# Patient Record
Sex: Female | Born: 1950 | Race: Black or African American | Hispanic: No | Smoking: Never smoker
Health system: Southern US, Community
[De-identification: ages and names within clinical notes are randomized; demographics above are authoritative.]

## PROBLEM LIST (undated history)

## (undated) DIAGNOSIS — M25551 Pain in right hip: Secondary | ICD-10-CM

## (undated) DIAGNOSIS — R42 Dizziness and giddiness: Secondary | ICD-10-CM

## (undated) DIAGNOSIS — I10 Essential (primary) hypertension: Secondary | ICD-10-CM

## (undated) DIAGNOSIS — J3089 Other allergic rhinitis: Secondary | ICD-10-CM

## (undated) DIAGNOSIS — R12 Heartburn: Secondary | ICD-10-CM

## (undated) DIAGNOSIS — R0789 Other chest pain: Secondary | ICD-10-CM

## (undated) DIAGNOSIS — H547 Unspecified visual loss: Secondary | ICD-10-CM

## (undated) DIAGNOSIS — M7989 Other specified soft tissue disorders: Secondary | ICD-10-CM

## (undated) DIAGNOSIS — Z0000 Encounter for general adult medical examination without abnormal findings: Principal | ICD-10-CM

## (undated) DIAGNOSIS — M1990 Unspecified osteoarthritis, unspecified site: Secondary | ICD-10-CM

## (undated) DIAGNOSIS — K219 Gastro-esophageal reflux disease without esophagitis: Secondary | ICD-10-CM

## (undated) DIAGNOSIS — G4730 Sleep apnea, unspecified: Secondary | ICD-10-CM

## (undated) DIAGNOSIS — J45909 Unspecified asthma, uncomplicated: Secondary | ICD-10-CM

## (undated) DIAGNOSIS — R200 Anesthesia of skin: Secondary | ICD-10-CM

## (undated) DIAGNOSIS — H9319 Tinnitus, unspecified ear: Secondary | ICD-10-CM

## (undated) DIAGNOSIS — E876 Hypokalemia: Secondary | ICD-10-CM

## (undated) DIAGNOSIS — R0602 Shortness of breath: Secondary | ICD-10-CM

## (undated) DIAGNOSIS — F329 Major depressive disorder, single episode, unspecified: Secondary | ICD-10-CM

## (undated) DIAGNOSIS — E669 Obesity, unspecified: Secondary | ICD-10-CM

## (undated) DIAGNOSIS — M549 Dorsalgia, unspecified: Secondary | ICD-10-CM

## (undated) DIAGNOSIS — M79606 Pain in leg, unspecified: Secondary | ICD-10-CM

## (undated) HISTORY — DX: Shortness of breath: R06.02

## (undated) HISTORY — PX: OTHER SURGICAL HISTORY: SHX169

## (undated) HISTORY — DX: Sleep apnea, unspecified: G47.30

## (undated) HISTORY — DX: Major depressive disorder, single episode, unspecified: F32.9

## (undated) HISTORY — DX: Morbid (severe) obesity due to excess calories: E66.01

## (undated) HISTORY — DX: Gastro-esophageal reflux disease without esophagitis: K21.9

## (undated) HISTORY — DX: Hypokalemia: E87.6

## (undated) HISTORY — DX: Obesity, unspecified: E66.9

## (undated) HISTORY — DX: Essential (primary) hypertension: I10

## (undated) HISTORY — DX: Dizziness and giddiness: R42

## (undated) HISTORY — DX: Encounter for general adult medical examination without abnormal findings: Z00.00

## (undated) HISTORY — DX: Other allergic rhinitis: J30.89

## (undated) HISTORY — DX: Unspecified asthma, uncomplicated: J45.909

## (undated) HISTORY — DX: Unspecified osteoarthritis, unspecified site: M19.90

## (undated) HISTORY — DX: Anesthesia of skin: R20.0

## (undated) HISTORY — DX: Other chest pain: R07.89

## (undated) HISTORY — DX: Other specified soft tissue disorders: M79.89

## (undated) HISTORY — DX: Dorsalgia, unspecified: M54.9

## (undated) HISTORY — DX: Tinnitus, unspecified ear: H93.19

## (undated) HISTORY — DX: Unspecified visual loss: H54.7

## (undated) HISTORY — DX: Pain in leg, unspecified: M79.606

## (undated) HISTORY — DX: Heartburn: R12

## (undated) HISTORY — DX: Pain in right hip: M25.551

---

## 1995-06-04 HISTORY — PX: BIOPSY BREAST: PRO8

## 2002-06-03 HISTORY — PX: COLONOSCOPY: SHX174

## 2006-01-10 ENCOUNTER — Ambulatory Visit: Payer: Self-pay | Admitting: Internal Medicine

## 2006-01-16 ENCOUNTER — Ambulatory Visit: Payer: Self-pay | Admitting: Internal Medicine

## 2006-02-07 ENCOUNTER — Ambulatory Visit: Payer: Self-pay | Admitting: Internal Medicine

## 2006-05-02 ENCOUNTER — Ambulatory Visit: Payer: Self-pay | Admitting: Internal Medicine

## 2006-06-23 ENCOUNTER — Ambulatory Visit: Payer: Self-pay | Admitting: Internal Medicine

## 2006-08-08 ENCOUNTER — Ambulatory Visit: Payer: Self-pay | Admitting: Internal Medicine

## 2006-08-08 LAB — CONVERTED CEMR LAB
Calcium: 9.2 mg/dL (ref 8.4–10.5)
Chloride: 105 meq/L (ref 96–112)
Creatinine, Ser: 0.9 mg/dL (ref 0.4–1.2)
GFR calc non Af Amer: 70 mL/min

## 2006-09-23 LAB — CONVERTED CEMR LAB: Pap Smear: NORMAL

## 2006-09-29 ENCOUNTER — Ambulatory Visit: Payer: Self-pay | Admitting: Pulmonary Disease

## 2006-10-23 ENCOUNTER — Ambulatory Visit (HOSPITAL_BASED_OUTPATIENT_CLINIC_OR_DEPARTMENT_OTHER): Admission: RE | Admit: 2006-10-23 | Discharge: 2006-10-23 | Payer: Self-pay | Admitting: Pulmonary Disease

## 2006-11-01 ENCOUNTER — Ambulatory Visit: Payer: Self-pay | Admitting: Pulmonary Disease

## 2006-12-08 ENCOUNTER — Ambulatory Visit: Payer: Self-pay | Admitting: Pulmonary Disease

## 2007-01-12 ENCOUNTER — Ambulatory Visit: Payer: Self-pay | Admitting: Pulmonary Disease

## 2007-01-31 ENCOUNTER — Encounter: Payer: Self-pay | Admitting: *Deleted

## 2007-01-31 DIAGNOSIS — J309 Allergic rhinitis, unspecified: Secondary | ICD-10-CM | POA: Insufficient documentation

## 2007-01-31 DIAGNOSIS — K219 Gastro-esophageal reflux disease without esophagitis: Secondary | ICD-10-CM | POA: Insufficient documentation

## 2007-01-31 DIAGNOSIS — J45909 Unspecified asthma, uncomplicated: Secondary | ICD-10-CM | POA: Insufficient documentation

## 2007-01-31 DIAGNOSIS — I10 Essential (primary) hypertension: Secondary | ICD-10-CM | POA: Insufficient documentation

## 2007-05-26 ENCOUNTER — Ambulatory Visit: Payer: Self-pay | Admitting: Internal Medicine

## 2007-05-26 DIAGNOSIS — M722 Plantar fascial fibromatosis: Secondary | ICD-10-CM | POA: Insufficient documentation

## 2007-05-26 DIAGNOSIS — M7710 Lateral epicondylitis, unspecified elbow: Secondary | ICD-10-CM | POA: Insufficient documentation

## 2007-06-20 ENCOUNTER — Ambulatory Visit: Payer: Self-pay | Admitting: Internal Medicine

## 2007-06-20 DIAGNOSIS — J019 Acute sinusitis, unspecified: Secondary | ICD-10-CM | POA: Insufficient documentation

## 2007-07-23 ENCOUNTER — Ambulatory Visit: Payer: Self-pay | Admitting: Internal Medicine

## 2007-08-18 ENCOUNTER — Encounter (INDEPENDENT_AMBULATORY_CARE_PROVIDER_SITE_OTHER): Payer: Self-pay | Admitting: *Deleted

## 2007-09-16 ENCOUNTER — Ambulatory Visit: Payer: Self-pay | Admitting: Internal Medicine

## 2007-09-18 LAB — CONVERTED CEMR LAB
ALT: 26 units/L (ref 0–35)
Albumin: 3.6 g/dL (ref 3.5–5.2)
BUN: 13 mg/dL (ref 6–23)
CO2: 29 meq/L (ref 19–32)
Calcium: 9.1 mg/dL (ref 8.4–10.5)
Creatinine, Ser: 1 mg/dL (ref 0.4–1.2)
GFR calc Af Amer: 74 mL/min
Glucose, Bld: 89 mg/dL (ref 70–99)
HDL: 52 mg/dL (ref >39.0)
TSH: 2.18 microintl units/mL (ref 0.35–5.50)
Total Protein: 7 g/dL (ref 6.0–8.3)
Triglycerides: 57 mg/dL (ref 0–149)
VLDL: 11 mg/dL (ref 0–40)

## 2007-10-09 ENCOUNTER — Ambulatory Visit: Payer: Self-pay | Admitting: Internal Medicine

## 2007-11-05 ENCOUNTER — Ambulatory Visit: Payer: Self-pay | Admitting: Internal Medicine

## 2007-11-26 ENCOUNTER — Emergency Department (HOSPITAL_COMMUNITY): Admission: EM | Admit: 2007-11-26 | Discharge: 2007-11-26 | Payer: Self-pay | Admitting: Emergency Medicine

## 2007-12-21 LAB — CONVERTED CEMR LAB: Pap Smear: ABNORMAL

## 2008-04-11 ENCOUNTER — Ambulatory Visit: Payer: Self-pay | Admitting: Internal Medicine

## 2008-04-11 DIAGNOSIS — R209 Unspecified disturbances of skin sensation: Secondary | ICD-10-CM | POA: Insufficient documentation

## 2008-04-11 DIAGNOSIS — E669 Obesity, unspecified: Secondary | ICD-10-CM | POA: Insufficient documentation

## 2008-04-11 DIAGNOSIS — R12 Heartburn: Secondary | ICD-10-CM | POA: Insufficient documentation

## 2008-04-11 HISTORY — DX: Morbid (severe) obesity due to excess calories: E66.01

## 2008-04-11 LAB — CONVERTED CEMR LAB
BUN: 14 mg/dL (ref 6–23)
CO2: 30 meq/L (ref 19–32)
Chloride: 111 meq/L (ref 96–112)
Cholesterol: 133 mg/dL (ref 0–200)
GFR calc non Af Amer: 69 mL/min
LDL Cholesterol: 82 mg/dL (ref 0–99)
Potassium: 3.7 meq/L (ref 3.5–5.1)
TSH: 1.98 microintl units/mL (ref 0.35–5.50)
Triglycerides: 51 mg/dL (ref 0–149)

## 2008-06-20 ENCOUNTER — Ambulatory Visit: Payer: Self-pay | Admitting: Internal Medicine

## 2008-06-20 DIAGNOSIS — E782 Mixed hyperlipidemia: Secondary | ICD-10-CM | POA: Insufficient documentation

## 2008-08-22 ENCOUNTER — Ambulatory Visit: Payer: Self-pay | Admitting: Internal Medicine

## 2008-08-22 LAB — CONVERTED CEMR LAB
ALT: 16 units/L (ref 0–35)
Cholesterol: 120 mg/dL (ref 0–200)
LDL Cholesterol: 64 mg/dL (ref 0–99)
Total CHOL/HDL Ratio: 3

## 2008-09-05 ENCOUNTER — Ambulatory Visit: Payer: Self-pay | Admitting: Internal Medicine

## 2009-01-18 ENCOUNTER — Ambulatory Visit: Payer: Self-pay | Admitting: Internal Medicine

## 2009-01-18 LAB — CONVERTED CEMR LAB
Chloride: 107 meq/L (ref 96–112)
Glucose, Bld: 99 mg/dL (ref 70–99)
Hgb A1c MFr Bld: 5.4 % (ref 4.6–6.1)
Potassium: 4.5 meq/L (ref 3.5–5.3)
Sodium: 142 meq/L (ref 135–145)

## 2009-01-23 ENCOUNTER — Encounter: Payer: Self-pay | Admitting: Internal Medicine

## 2009-02-19 ENCOUNTER — Emergency Department (HOSPITAL_COMMUNITY): Admission: EM | Admit: 2009-02-19 | Discharge: 2009-02-19 | Payer: Self-pay | Admitting: Emergency Medicine

## 2009-03-21 ENCOUNTER — Ambulatory Visit: Payer: Self-pay | Admitting: Internal Medicine

## 2009-03-21 DIAGNOSIS — R609 Edema, unspecified: Secondary | ICD-10-CM | POA: Insufficient documentation

## 2009-06-08 ENCOUNTER — Encounter: Payer: Self-pay | Admitting: Internal Medicine

## 2009-06-30 ENCOUNTER — Ambulatory Visit: Payer: Self-pay | Admitting: Internal Medicine

## 2009-06-30 LAB — CONVERTED CEMR LAB
ALT: 15 units/L (ref 0–35)
Alkaline Phosphatase: 78 units/L (ref 39–117)
Bilirubin, Direct: 0.2 mg/dL (ref 0.0–0.3)
CO2: 33 meq/L — ABNORMAL HIGH (ref 19–32)
Chloride: 105 meq/L (ref 96–112)
Sodium: 144 meq/L (ref 135–145)
TSH: 1.66 microintl units/mL (ref 0.35–5.50)
Total CHOL/HDL Ratio: 3
Total Protein: 6.8 g/dL (ref 6.0–8.3)

## 2009-07-06 ENCOUNTER — Telehealth: Payer: Self-pay | Admitting: Internal Medicine

## 2009-07-07 ENCOUNTER — Ambulatory Visit: Payer: Self-pay | Admitting: Internal Medicine

## 2009-07-07 DIAGNOSIS — M79609 Pain in unspecified limb: Secondary | ICD-10-CM | POA: Insufficient documentation

## 2009-07-07 DIAGNOSIS — J069 Acute upper respiratory infection, unspecified: Secondary | ICD-10-CM | POA: Insufficient documentation

## 2009-07-07 LAB — CONVERTED CEMR LAB
Inflenza A Ag: NEGATIVE
Influenza B Ag: NEGATIVE

## 2009-07-21 ENCOUNTER — Ambulatory Visit: Payer: Self-pay | Admitting: Family Medicine

## 2009-07-21 ENCOUNTER — Encounter: Payer: Self-pay | Admitting: Internal Medicine

## 2009-08-10 ENCOUNTER — Encounter: Payer: Self-pay | Admitting: Internal Medicine

## 2009-12-20 LAB — CONVERTED CEMR LAB: Pap Smear: ABNORMAL

## 2010-01-16 ENCOUNTER — Ambulatory Visit: Payer: Self-pay | Admitting: Internal Medicine

## 2010-01-16 DIAGNOSIS — M1990 Unspecified osteoarthritis, unspecified site: Secondary | ICD-10-CM | POA: Insufficient documentation

## 2010-01-16 HISTORY — DX: Unspecified osteoarthritis, unspecified site: M19.90

## 2010-07-03 NOTE — Assessment & Plan Note (Signed)
Summary: 6 month follow up/mhf   Vital Signs:  Patient profile:   60 year old female Weight:      246.50 pounds BMI:     41.17 O2 Sat:      98 % on Room air Temp:     98.1 degrees F oral Pulse rate:   61 / minute Pulse rhythm:   regular Resp:     16 per minute BP sitting:   118 / 72  (right arm) Cuff size:   large  Vitals Entered By: Glendell Docker CMA (January 16, 2010 9:20 AM)  O2 Flow:  Room air CC: Rm 3- 6 Month Follow up Is Patient Diabetic? No Pain Assessment Patient in pain? yes     Location: Left Shoulder Intensity: 5 Type: dull Onset of pain  Intermittent Comments patient c/o left shoulder pain for the past 2 weeks, Tingling in right leg mostly at night   Primary Care Janetta Vandoren:  DThomos Lemons DO  CC:  Rm 3- 6 Month Follow up.  History of Present Illness:  Hypertension Follow-Up      This is a 60 year old woman who presents for Hypertension follow-up.  The patient denies lightheadedness, headaches, and edema.  The patient denies the following associated symptoms: chest pain.  Compliance with medications (by patient report) has been near 100%.  The patient reports that dietary compliance has been poor.  The patient reports no exercise.    new job for [employer]  Preventive Screening-Counseling & Management  Alcohol-Tobacco     Smoking Status: never  Allergies: 1)  ! Sulfa  Past History:  Past Medical History: Hypertension Obesity Allergic rhinitis           Past Surgical History: Childbirth          Family History: Mother deceased at age 93, had a history of hepatic coma and stroke.  Father deceased at age 66 secondary to esophageal cancer, also had a history of prostate cancer.  Patient has two brothers that are deceased, one with HIV, other with cirrhosis. 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   Lungs:  normal respiratory effort and normal breath sounds.   Heart:  normal rate, regular rhythm, and no gallop.   Msk:  no joint tenderness, no joint swelling, no redness over joints, and no joint instability.   Extremities:  trace left pedal edema and trace right pedal edema.     Impression & Recommendations:  Problem # 1:  HYPERTENSION (ICD-401.9) Assessment Unchanged  Her updated medication list for this problem includes:    Azor 10-40 Mg Tabs (Amlodipine-olmesartan) ..... One by mouth once daily  BP today: 118/72 Prior BP: 146/76 (07/07/2009)  Labs Reviewed: K+: 3.8 (06/30/2009) Creat: : 1.0 (06/30/2009)   Chol: 143 (06/30/2009)   HDL: 49.40 (06/30/2009)   LDL: 69 (06/30/2009)   TG: 122.0 (06/30/2009)  Problem # 2:  GERD (ICD-530.81) use otc zantac as needed. check H Pylori antibody  Problem # 3:  OSTEOARTHRITIS, SHOULDER, LEFT (ICD-715.91) Pt with intermittent left shoulder pain.  she defers ortho referral.  use NSAIDs sparingly  Complete Medication List: 1)  Allergy Shots  .... Every week 2)  Azor 10-40 Mg Tabs (Amlodipine-olmesartan) .... One by mouth once daily  Patient Instructions: 1)  Please schedule  a follow-up appointment in 1 year CPX 2)  Use zantac 150 mg two times a day 3)  BMP prior to visit, ICD-9: 401.9 4)  Hepatic Panel prior to visit, ICD-9: 401.9 5)  Lipid Panel prior to visit, ICD-9: 401.9 6)  TSH prior to visit, ICD-9: 401.9 7)  H. Pylori antibody: 530.81 8)  Return for blood work in January 2012 Prescriptions: AZOR 10-40 MG TABS (AMLODIPINE-OLMESARTAN) one by mouth once daily  #90 x 3   Entered and Authorized by:   D. Thomos Lemons DO   Signed by:   D. Thomos Lemons DO on 01/16/2010   Method used:   [facility]  [address] [phone][facility] (retail)       [address]       [place], [place]  [id number]       Ph: [phone] or [phone]       Fax: [phone]    RxID:   [id number]    Preventive Care Screening  Mammogram:    Date:  12/20/2009    Results:  normal   Pap Smear:    Date:  12/20/2009    Results:  abnormal

## 2010-07-03 NOTE — Miscellaneous (Signed)
Summary: BONE DENSITY  Clinical Lists Changes  Orders: Added new Test order of T-Bone Densitometry (77080) - Signed Added new Test order of T-Lumbar Vertebral Assessment (77082) - Signed 

## 2010-07-03 NOTE — Assessment & Plan Note (Signed)
Summary: 6 MONTH FOLLOW UP/MHF   Vital Signs:  Patient profile:   60 year old Terri [name] Weight:      248.75 pounds BMI:     41.54 O2 Sat:      100 % on Room air Temp:     98.1 degrees F oral Pulse rate:   67 / minute Pulse rhythm:   regular Resp:     16 per minute BP sitting:   146 / 76  (right arm) Cuff size:   large  Vitals Entered By: Glendell Docker CMA (July 07, 2009 3:Terri PM)  O2 Flow:  Room air  Primary Care Provider:  D. Thomos Lemons DO  CC:  6 Month Follow up and URI symptoms.  History of Present Illness: 6 Month Follow up   Hypertension Follow-Up      This is a 60 year old woman who presents for Hypertension follow-up.  The patient denies lightheadedness and urinary frequency.  The patient denies the following associated symptoms: chest pain.  Compliance with medications (by patient report) has been near 100%.  The patient reports that dietary compliance has been fair.    URI Symptoms      The patient also presents with URI symptoms.  The patient reports nasal congestion and clear nasal discharge.  The patient denies fever.  mild sore throat  left big toe soreness, worse when pressure applied for the past few month.   Allergies: 1)  ! Sulfa  Past History:  Past Medical History: Hypertension Obesity Allergic rhinitis         Past Surgical History: Childbirth         Family History: Terri Terri [name] deceased at age 36, had a history of hepatic coma and stroke.  Terri Terri [name] deceased at age 46 secondary to esophageal cancer, also had a history of prostate cancer.  Patient has two brothers that are deceased, one with HIV, other with cirrhosis. Hypertension - sister       Social History: Occupation:  Middle school Warden/ranger Single Never Smoked   Alcohol use-yes (social)       Physical Exam  General:  alert and overweight-appearing.   Ears:  R ear normal and L ear normal.   Mouth:  pharyngeal erythema.   Neck:  supple and no carotid bruits.   Lungs:  normal  respiratory effort and normal breath sounds.   Heart:  normal rate, regular rhythm, and no gallop.   Msk:  left great toe nail tender.  no redness over toe   Impression & Recommendations:  Problem # 1:  URI (ICD-465.9)  Instructed on symptomatic treatment. Call if symptoms persist or worsen.   Problem # 2:  HYPERTENSION (ICD-401.9) stable.  Maintain current medication regimen.  The following medications were removed from the medication list:    Furosemide 20 Mg Tabs (Furosemide) ..... One by mouth once daily Her updated medication list for this problem includes:    Azor 10-40 Mg Tabs (Amlodipine-olmesartan) ..... One by mouth once daily  BP today: 146/76 Prior BP: 140/80 (03/21/2009)  Labs Reviewed: K+: 3.8 (06/30/2009) Creat: : 1.0 (06/30/2009)   Chol: 143 (06/30/2009)   HDL: 49.40 (06/30/2009)   LDL: 69 (06/30/2009)   TG: 122.0 (06/30/2009)  Problem # 3:  TOE PAIN (ICD-729.5)  Left great toe pain.  Possible subugual hematoma.  refer to podiatrist  Orders: Podiatry Referral (Podiatry)  Complete Medication List: 1)  Allergy Shots  .... Every week 2)  Azor 10-40 Mg Tabs (Amlodipine-olmesartan) .... One by  mouth once daily  Other Orders: Flu A+B (14782)  Patient Instructions: 1)  Please schedule a follow-up appointment in 6 months for CPX 2)  Call our office if your upper respiratory symptoms do not  improve or gets worse. 3)  Our office will contact you re:  referral to podiatrist Prescriptions: AZOR 10-40 MG TABS (AMLODIPINE-OLMESARTAN) one by mouth once daily  #90 x 3   Entered and Authorized by:   D. Thomos Lemons DO   Signed by:   D. Thomos Lemons DO on 07/07/2009   Method used:   [facility]  [address] [phone]* (retail)       [address]       [place], [place]  [id number]       Ph: [phone] or [phone]       Fax: [phone]   RxID:   [id number]   Current Allergies (reviewed today): !  SULFA       Laboratory Results    Other Tests  Influenza A: negative Influenza B: negative

## 2010-07-03 NOTE — Progress Notes (Signed)
Summary: bone density   Phone Note Outgoing Call   Summary of Call: call pt - I suggest screening DEXA.  please arrange at [facility]  code [phone] Initial call taken by: D. Thomos Lemons DO,  July 06, 2009 1:14 PM  Follow-up for Phone Call        appt set for Friday 2-11 Roselle Locus  July 10, 2009 1:13 PM

## 2010-07-03 NOTE — Letter (Signed)
Summary: Care Consideration Regarding Osteoporosis Screening/[facility]  Care Consideration Regarding Osteoporosis Screening/[facility]   Imported By: Lanelle Bal 07/13/2009 11:33:33  _____________________________________________________________________  External Attachment:    Type:   Image     Comment:   External Document

## 2010-07-03 NOTE — Consult Note (Signed)
Summary: [facility], [place]   Imported By: Lanelle Bal 07/27/2009 12:23:22  _____________________________________________________________________  External Attachment:    Type:   Image     Comment:   External Document

## 2010-07-03 NOTE — Letter (Signed)
   [place] [facility] [address] [place], [place]  [address]  [place] Phone: [phone]      August 10, 2009   [facility] [address] [place], [place] [address]  RE:  LAB RESULTS  Dear  Ms. Omlor,  The following is an interpretation of your most recent lab tests.  Please take note of any instructions provided or changes to medications that have resulted from your lab work.     Your bone density scan shows osteopenia.   Please take Caltrate with vitamin D 600 mg twice daily with food.    Sincerely Yours,    Dr. Thomos Lemons

## 2010-09-25 ENCOUNTER — Other Ambulatory Visit: Payer: Self-pay | Admitting: Internal Medicine

## 2010-10-16 NOTE — Procedures (Signed)
[name], Terri [name]              ACCOUNT NO.:  [id number]   MEDICAL RECORD NO.:  [id number]          PATIENT TYPE:  OUT   LOCATION:  SLEEP CENTER                 FACILITY:  [facility]   PHYSICIAN:  Barbaraann Share, MD,FCCPDATE OF BIRTH:  08-09-52   DATE OF STUDY:  10/23/2006                            NOCTURNAL POLYSOMNOGRAM   REFERRING PHYSICIAN:   INDICATION FOR STUDY:  Hypersomnia with sleep apnea.   EPWORTH SLEEPINESS SCORE:  9.   MEDICATIONS:   SLEEP ARCHITECTURE:  The patient had a total sleep time of 324 minutes  with decreased REM and never achieved slow wave sleep.  Sleep onset  latency was prolonged at 35 minutes, and REM onset was normal at 75  minutes.  Sleep efficiency was mildly decreased at 86%.   RESPIRATORY DATA:  The patient was found to have 57 obstructive  hypopneas and 30 apneas, for an apnea-hypopnea index of 16 events per  hour.  Events were not positional but were almost exclusively REM  related.   OXYGEN DATA:  His O2 desaturation as low as 80% with the patient's  obstructive events.   CARDIAC DATA:  No clinically significant cardiac arrhythmias were noted.   MOVEMENT-PARASOMNIA:  Small numbers of leg jerks without clinically  significant impact.   IMPRESSIONS-RECOMMENDATIONS:  Mild obstructive sleep apnea/hypopnea  syndrome with an apnea-hypopnea index of 16 events per hour and O2  desaturation as low as 80%.  The patient's events occurred primarily  during REM, therefore I would expect a more significant clinical impact  than this particular severity of obstructive sleep apnea.  Treatment for  this can include weight loss alone if applicable, upper airway surgery,  oral appliance, and also CPAP.  Clinical correlation is suggested.      Barbaraann Share, MD,FCCP  Diplomate, American Board of Sleep  Medicine  Electronically Signed    [facility]/MEDQ  D:  11/04/2006 16:10:55  T:  11/04/2006 18:29:18  Job:  [id number]

## 2010-10-19 NOTE — Assessment & Plan Note (Signed)
[facility]                             PRIMARY CARE OFFICE NOTE   Terri [name], Terri [name]                     MRN:          [id number]  DATE:01/16/2006                            DOB:          May 15, 1951    CHIEF COMPLAINT:  New patient to practice.   HISTORY OF PRESENT ILLNESS:  The patient is a 60 year old African-American  female here to establish primary care.  She has recently moved up to  [facility] from [place], [place].  She has a past medical history of  hypertension and asthma.  Her blood pressure issues were identified  approximately five years ago.  She has only been on medication for the last  six to eight months.  She states she was prescribed clonidine by an Urgent  Care physician.  Has not had followup regarding her blood pressure.  She was  instructed to take it twice a day.  She has only taken it at bedtime.  Has  been evaluated recently in the emergency room due to symptoms of headache  and blood pressure over 200 systolic.  A CAT scan was performed, which is  reported negative by the patient.   She has also been seen by [facility] allergist regarding asthma and allergic  rhinitis.  Has been recently started on Zyrtec 10 mg and Nasonex as well as  Astelin nasal spray.  She has had asthma issues since 2001.  Patient reports  upper respiratory congestion as well as wheezing.   SOCIAL HISTORY:  Patient is single.  She does have a 48 year old child.  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   FAMILY HISTORY:  Mother deceased at age 7, had a history of hepatic coma  and stroke.  Father deceased at age 89 secondary to esophageal cancer, also  had a history of prosthetic cancer.  Patient has two brothers that are  deceased, one with HIV, other with cirrhosis, and one sister who is also  hypertensive.   HABITS:  She is a social drinker.  Denies any history of tobacco use.   PREVENTATIVE  CARE HISTORY:  Her last Pap was in April 2004.  Last mammogram  of April 2004.  Has not had a colonoscopy.   REVIEW OF SYSTEMS:  No fevers or chills.  Patient has had intermittent  dizziness over the last several months, predominantly while she is in bed,  with quick head movements.  CARDIOVASCULAR :  No chest pain.  No  palpitations.  Some chest tightness with wheezing.  Positive heartburn.  No  nausea or vomiting.  No changes in her stool habits.  No dysuria, frequency,  or urgency.  All other systems negative.   LABORATORY DATA:  Patient had screening labs prior to our visit.  H&H was  13.3, 39.3.  Total cholesterol 155.  Triglycerides 69.  HDL 51.5.  LDL 90.  TSH was normal at 3.03.  Fasting blood sugar was minimally elevated at 103.  BUN was 14.  Creatinine 0.9.  LFTs were unremarkable.  UA was unremarkable.   PHYSICAL EXAMINATION:  VITALS:  Height is 5 foot 5.  Weight is __________  pounds.  Temperature is 97.7.  Pulse is 66.  BP is 177/89.  GENERAL:  The patient is a pleasant overweight/obese 60 year old African-  American female.  Pleasant.  In no apparent distress.  Appears her stated  age.  HEENT:  Normocephalic/atraumatic.  Pupils are equal and reactive to light  bilaterally.  Extraocular motility was intact.  Patient was anicteric.  There was mild bilateral injection of her conjunctiva.  Nasal mucosa were  boggy and erythematous bilaterally.  Oropharynx showed some cobblestoning,  otherwise unremarkable.  NECK:  Supple.  No adenopathy, carotid bruit, or thyromegaly.  CHEST:  Normal respiratory effort.  Chest was clear to auscultation  bilaterally.  No rhonchi, rales, or wheezing.  CARDIOVASCULAR:  Regular rate and rhythm.  No significant murmurs, rubs, or  gallops appreciated.  ABDOMEN:  Protuberant.  Nontender.  Positive bowel sounds.  No organomegaly.  MUSCULOSKELETAL:  No clubbing, cyanosis, or edema.  Skin was warm and dry.  Patient had intact pedis dorsalis pulses.   NEUROLOGIC:  Cranial nerves II-XII was grossly intact.  She was nonfocal.   IMPRESSION:  1. Hypertension, uncontrolled.  2. Intermittent dizziness, likely benign positional vertigo.  3. Obesity.  4. Health maintenance.   RECOMMENDATIONS:  Patient is to discontinue her clonidine.  She was given  samples of Benicar 20/12.5.  She is to take one in the a.m.  She will return  to the office in two weeks for BMET.  We will also consider checking  hemoglobin A1c.  Stressed the importance of regular exercise and a goal of  decreasing her weight by 10% within the next six months to a year.   If she has recurrence of dizziness symptoms, we will discuss __________  exercises.  On follow-up visit, we will further make recommendations on  mammogram and colon cancer screening.                                   Barbette Hair. Artist Pais, DO   RDY/MedQ  DD:  01/16/2006  DT:  01/16/2006  Job #:  [id number]

## 2010-10-19 NOTE — Assessment & Plan Note (Signed)
[facility]                             PULMONARY OFFICE NOTE   NAME:Terri [name], Terri [name]                     MRN:          [id number]  DATE:09/29/2006                            DOB:          Dec 31, 1950    SLEEP MEDICINE CONSULTATION   HISTORY OF PRESENT ILLNESS:  The patient is a very pleasant 60 year old  black female whom I have been asked to see for possible sleep apnea. The  patient states that she has been having a lot of daytime fatigue as well  as inappropriate daytime sleepiness. She typically gets to bed between  10 and 11 and gets up at 6:15 to start her day. She is not rested upon  arising. She has been told that she has loud snoring, but no one has  ever mentioned pauses in her breathing during sleep. She denies any  choking or rales. The patient teaches in middle school and does note  sleep pressure with periods of inactivity during the day. She does feel  like it bothers her and interferes with her quality of life. She also  had some dozing with TV and movies in the evening and occasionally  dozing with driving even short distances. The patient's weight is stable  over the last two years.   PAST MEDICAL HISTORY:  1. Hypertension.  2. History of asthma.  3. History of allergic rhinitis.  4. History of ovarian cysts.   CURRENT MEDICATIONS:  1. Zyrtec 10 mg daily p.r.n.  2. Allergy shots q week.  3. Azor 5/40 one daily.   ALLERGIES:  HAS AN ALLERGY TO SULFA WHICH CAUSES A RASH.   SOCIAL HISTORY:  She has never smoked. She works as a Runner, broadcasting/film/video in [employer]. She is single and has children.   FAMILY HISTORY:  Is remarkable for asthma and allergies as well as heart  disease and cancer.   REVIEW OF SYSTEMS:  As per History of Present Illness. Also, see the  patient intake form documented in the chart.   PHYSICAL EXAMINATION:  In general, she is a morbidly obese black female  in no acute distress. Blood pressure is 140/86, pulse  65, temperature  99.0, weight is 243 pounds. O2 saturation on room air is 97%.  HEENT: Pupils equal, round and reactive to light and accommodation.  Extraocular muscles are intact. Nares: Shows turbinate hypertrophy with  bogginess/edema. Oropharynx shows small posterior pharyngeal space with  tonsillar hypertrophy and significant elongation of the soft palate.  NECK: is supple without JVD or lymphadenopathy. There is no palpable  thyromegaly.  CHEST: Is totally clear.  CARDIAC: Reveals regular rate and rhythm. No murmurs, rubs or gallops.  ABDOMEN: Soft and nontender with good bowel sounds.  GENITAL, RECTAL, BREASTS: Was not done and not indicated.  LOWER EXTREMITIES: Shows trace edema. Pulses are intact distally.  NEUROLOGIC: was alert and oriented with no obvious motor deficits.   IMPRESSION:  Probable obstructive sleep apnea. The patient gives a very  good history for this, has abnormal upper airway anatomy, and is  significantly obese. I have had a long discussion with her  about the  pathophysiology of sleep apnea including short-term quality of life  issues and the longterm cardiovascular issues. At this point in time, I  do think she needs to have nocturnal polysomnography. The patient is  agreeable to that.   PLAN:  1. Schedule for nocturnal polysomnography.  2. Work on weight loss.  3. The patient will follow up after the above.     Barbaraann Share, MD,FCCP  Electronically Signed    [facility]/MedQ  DD: 09/29/2006  DT: 09/29/2006  Job #: [id number]   cc:   Barbette Hair. Artist Pais, DO

## 2010-12-21 ENCOUNTER — Other Ambulatory Visit (INDEPENDENT_AMBULATORY_CARE_PROVIDER_SITE_OTHER): Payer: Self-pay

## 2010-12-21 DIAGNOSIS — Z0000 Encounter for general adult medical examination without abnormal findings: Secondary | ICD-10-CM

## 2010-12-21 LAB — CBC WITH DIFFERENTIAL/PLATELET
Basophils Relative: 0.6 % (ref 0.0–3.0)
Eosinophils Absolute: 0.3 10*3/uL (ref 0.0–0.7)
HCT: 38.6 % (ref 36.0–46.0)
Hemoglobin: 12.8 g/dL (ref 12.0–15.0)
Lymphs Abs: 2.3 10*3/uL (ref 0.7–4.0)
MCHC: 33.2 g/dL (ref 30.0–36.0)
MCV: 97.2 fl (ref 78.0–100.0)
Monocytes Absolute: 0.8 10*3/uL (ref 0.1–1.0)
Neutro Abs: 5.6 10*3/uL (ref 1.4–7.7)
Neutrophils Relative %: 61.6 % (ref 43.0–77.0)
RBC: 3.98 Mil/uL (ref 3.87–5.11)

## 2010-12-21 LAB — URINALYSIS
Specific Gravity, Urine: >=1.03 (ref 1.000–1.030)
Total Protein, Urine: NEGATIVE
Urine Glucose: NEGATIVE
Urobilinogen, UA: 0.2 (ref 0.0–1.0)

## 2010-12-21 LAB — HEPATIC FUNCTION PANEL
Bilirubin, Direct: 0.1 mg/dL (ref 0.0–0.3)
Total Bilirubin: 0.7 mg/dL (ref 0.3–1.2)
Total Protein: 7.7 g/dL (ref 6.0–8.3)

## 2010-12-21 LAB — BASIC METABOLIC PANEL
Calcium: 8.8 mg/dL (ref 8.4–10.5)
Creatinine, Ser: 0.9 mg/dL (ref 0.4–1.2)

## 2010-12-21 LAB — LIPID PANEL
Cholesterol: 148 mg/dL (ref 0–200)
VLDL: 10 mg/dL (ref 0.0–40.0)

## 2010-12-21 LAB — TSH: TSH: 3.04 u[IU]/mL (ref 0.35–5.50)

## 2010-12-27 ENCOUNTER — Encounter: Payer: Self-pay | Admitting: Internal Medicine

## 2010-12-31 ENCOUNTER — Other Ambulatory Visit: Payer: Self-pay | Admitting: Internal Medicine

## 2010-12-31 DIAGNOSIS — Z0000 Encounter for general adult medical examination without abnormal findings: Secondary | ICD-10-CM

## 2011-01-07 ENCOUNTER — Encounter: Payer: Self-pay | Admitting: Internal Medicine

## 2011-01-07 ENCOUNTER — Ambulatory Visit (INDEPENDENT_AMBULATORY_CARE_PROVIDER_SITE_OTHER): Payer: BC Managed Care – PPO | Admitting: Internal Medicine

## 2011-01-07 DIAGNOSIS — Z0000 Encounter for general adult medical examination without abnormal findings: Secondary | ICD-10-CM

## 2011-01-07 DIAGNOSIS — K219 Gastro-esophageal reflux disease without esophagitis: Secondary | ICD-10-CM

## 2011-01-07 DIAGNOSIS — R42 Dizziness and giddiness: Secondary | ICD-10-CM

## 2011-01-07 MED ORDER — MECLIZINE HCL 25 MG PO TABS: 25.0000 mg | ORAL_TABLET | Freq: Three times a day (TID) | ORAL | Status: AC | PRN

## 2011-01-07 MED ORDER — OMEPRAZOLE 40 MG PO CPDR: 40.0000 mg | DELAYED_RELEASE_CAPSULE | Freq: Every day | ORAL | Status: DC

## 2011-01-07 NOTE — Assessment & Plan Note (Signed)
Nl exam. Continue pap/mammograms through gyn. Labs reviewed with patient(glucose non fasting). F/u in 35m.

## 2011-01-07 NOTE — Progress Notes (Signed)
  Subjective:    [name], female    DOB: [birth year], 60 y.o.   MRN: [id number]  HPI Pt presents to clinic for annual physical. States pap smears and mammograms utd through gyn office. Has rare intermittent vertigo that lasts ~3 days followed by spontaneous resolution. Takes no medication for this. Had recent 2wk h/o cough but has recently resolved entirely. C/o GERD sx's ~2-3 days a week. Previously took 14d course of otc ppi. Is attempting to eliminate food triggers. No other complaints.  Reviewed pmh, medications and allergies    Review of Systems see hpi    Objective:   Physical Exam  Nursing note and vitals reviewed. Constitutional: She appears well-developed and well-nourished.  HENT:  Head: Normocephalic and atraumatic.  Right Ear: Tympanic membrane, external ear and ear canal normal.  Left Ear: Tympanic membrane, external ear and ear canal normal.  Nose: Nose normal.  Mouth/Throat: Oropharynx is clear and moist. No oropharyngeal exudate.  Eyes: Conjunctivae and EOM are normal. Pupils are equal, round, and reactive to light. Right eye exhibits no discharge. Left eye exhibits no discharge. No scleral icterus.  Neck: Normal range of motion. Neck supple. No JVD present. Carotid bruit is not present. No thyromegaly present.  Cardiovascular: Normal rate, regular rhythm and normal heart sounds.  Exam reveals no gallop and no friction rub.   No murmur heard. Pulmonary/Chest: Effort normal and breath sounds normal. No respiratory distress. She has no wheezes. She has no rales.  Abdominal: Soft. Bowel sounds are normal. She exhibits no distension and no mass. There is no hepatosplenomegaly. There is no tenderness. There is no rebound and no guarding.  Lymphadenopathy:    She has no cervical adenopathy.  Neurological: She is alert.  Skin: Skin is warm and dry. No rash noted. No erythema.  Psychiatric: She has a normal mood and affect.          Assessment & Plan:

## 2011-01-07 NOTE — Assessment & Plan Note (Signed)
Begin omeprazole 40mg  po qd. Notify clinic if no resolution of sx after 3-4 wks. Identify and avoid food triggers.

## 2011-01-07 NOTE — Assessment & Plan Note (Signed)
Chronic intermittent. Attempt antivert prn. Discussed possible vestibular rehab if sx's become more frequent.

## 2011-07-05 ENCOUNTER — Ambulatory Visit: Payer: BC Managed Care – PPO | Admitting: Internal Medicine

## 2011-07-05 DIAGNOSIS — Z0279 Encounter for issue of other medical certificate: Secondary | ICD-10-CM

## 2011-10-11 ENCOUNTER — Telehealth: Payer: Self-pay | Admitting: Internal Medicine

## 2011-10-11 MED ORDER — AMLODIPINE-OLMESARTAN 10-40 MG PO TABS: 1.0000 | ORAL_TABLET | Freq: Every day | ORAL | Status: DC

## 2011-10-11 NOTE — Telephone Encounter (Signed)
Informed patient that azor has been sent in to pharmacy and she made an appointment for 11/26/11

## 2011-10-11 NOTE — Telephone Encounter (Signed)
Refill sent for azor 10-40mg  #90 x no refills. Pt was due for f/u in February. Please call pt to arrange f/u before next refill will be due.

## 2011-11-26 ENCOUNTER — Ambulatory Visit: Payer: BC Managed Care – PPO | Admitting: Internal Medicine

## 2011-12-02 ENCOUNTER — Encounter: Payer: Self-pay | Admitting: Internal Medicine

## 2011-12-02 ENCOUNTER — Ambulatory Visit (INDEPENDENT_AMBULATORY_CARE_PROVIDER_SITE_OTHER): Payer: BC Managed Care – PPO | Admitting: Internal Medicine

## 2011-12-02 VITALS — BP 138/84 | HR 65 | Temp 98.1°F | Resp 16 | Ht 64.5 in | Wt 249.5 lb

## 2011-12-02 DIAGNOSIS — E669 Obesity, unspecified: Secondary | ICD-10-CM

## 2011-12-02 DIAGNOSIS — I10 Essential (primary) hypertension: Secondary | ICD-10-CM

## 2011-12-02 DIAGNOSIS — K219 Gastro-esophageal reflux disease without esophagitis: Secondary | ICD-10-CM

## 2011-12-02 DIAGNOSIS — Z79899 Other long term (current) drug therapy: Secondary | ICD-10-CM

## 2011-12-02 LAB — BASIC METABOLIC PANEL
BUN: 12 mg/dL (ref 6–23)
CO2: 29 mEq/L (ref 19–32)
Chloride: 108 mEq/L (ref 96–112)
Creat: 0.87 mg/dL (ref 0.50–1.10)
Glucose, Bld: 94 mg/dL (ref 70–99)

## 2011-12-02 MED ORDER — AMLODIPINE-OLMESARTAN 10-40 MG PO TABS: 1.0000 | ORAL_TABLET | Freq: Every day | ORAL | Status: DC

## 2011-12-02 NOTE — Assessment & Plan Note (Signed)
Encouraged regular exercise and weight loss. rf azor. norvasc component suspected to be contributing to mild LE edema. Recommend outpt bp log. Obtain chem7

## 2011-12-02 NOTE — Patient Instructions (Signed)
Please schedule fasting labs prior to next visit Cbc, chem7, a1c, lipid, lft, tsh, ua with reflex micro- v70.0

## 2011-12-02 NOTE — Assessment & Plan Note (Signed)
Strongly encouraged low sugar/carb diet and regular exercise

## 2011-12-02 NOTE — Progress Notes (Signed)
  Subjective:    [name], female    DOB: [birth year], 61 y.o.   MRN: [id number]  HPI Pt presents to clinic for followup of multiple medical problems. Notes 2 wks h/o intermittent bilateral mild LE edema. Worsens during the day and improves by morning. BP borderline. Trying to decrease carb/sugars. GERD well controlled with daily ppi. No further vertigo.  Past Medical History  Diagnosis Date  . Hypertension   . Obesity   . Allergic rhinitis    Past Surgical History  Procedure Date  . Childbirth     reports that she has never smoked. She does not have any smokeless tobacco history on file. She reports that she drinks alcohol. She reports that she does not use illicit drugs. family history is not on file. Allergies  Allergen Reactions  . Sulfonamide Derivatives       Review of Systems see hpi     Objective:   Physical Exam  Physical Exam  Nursing note and vitals reviewed. Constitutional: Appears well-developed and well-nourished. No distress.  HENT:  Head: Normocephalic and atraumatic.  Right Ear: External ear normal.  Left Ear: External ear normal.  Eyes: Conjunctivae are normal. No scleral icterus.  Neck: Neck supple. Carotid bruit is not present.  Cardiovascular: Normal rate, regular rhythm and normal heart sounds.  Exam reveals no gallop and no friction rub.   No murmur heard. Pulmonary/Chest: Effort normal and breath sounds normal. No respiratory distress. He has no wheezes. no rales.  Lymphadenopathy:    He has no cervical adenopathy.  Neurological:Alert.  Skin: Skin is warm and dry. Not diaphoretic.  Psychiatric: Has a normal mood and affect.  Ext: trace bilateral LE edema      Assessment & Plan:

## 2011-12-02 NOTE — Assessment & Plan Note (Signed)
Well controlled. Continue PPI qd.

## 2012-01-09 ENCOUNTER — Telehealth: Payer: Self-pay | Admitting: Internal Medicine

## 2012-01-09 NOTE — Telephone Encounter (Signed)
Patient states that she would like a refill of omeprazole sent to [facility] on [address]

## 2012-01-10 MED ORDER — OMEPRAZOLE 40 MG PO CPDR: 40.0000 mg | DELAYED_RELEASE_CAPSULE | Freq: Every day | ORAL | Status: DC

## 2012-01-10 NOTE — Telephone Encounter (Signed)
Rx done/SLS 

## 2012-01-13 NOTE — Telephone Encounter (Signed)
Received, completed & faxed Prior Authorization form to [facility] for Omeprazole approval at [phone]

## 2012-01-13 NOTE — Telephone Encounter (Signed)
Prior Authorization Approval for Omeprazole, valid 07.22.13-08.12.14, received and Faxed to [facility] at [phone]/SLS

## 2012-03-24 ENCOUNTER — Encounter: Payer: Self-pay | Admitting: Internal Medicine

## 2012-05-25 ENCOUNTER — Encounter: Payer: BC Managed Care – PPO | Admitting: Internal Medicine

## 2012-06-22 ENCOUNTER — Other Ambulatory Visit: Payer: Self-pay | Admitting: Family Medicine

## 2012-06-22 ENCOUNTER — Encounter: Payer: Self-pay | Admitting: Family Medicine

## 2012-06-22 ENCOUNTER — Ambulatory Visit (INDEPENDENT_AMBULATORY_CARE_PROVIDER_SITE_OTHER): Payer: BC Managed Care – PPO | Admitting: Family Medicine

## 2012-06-22 VITALS — BP 128/82 | HR 69 | Temp 97.6°F | Ht 65.0 in | Wt 249.0 lb

## 2012-06-22 DIAGNOSIS — Z0000 Encounter for general adult medical examination without abnormal findings: Secondary | ICD-10-CM

## 2012-06-22 DIAGNOSIS — K219 Gastro-esophageal reflux disease without esophagitis: Secondary | ICD-10-CM

## 2012-06-22 DIAGNOSIS — E669 Obesity, unspecified: Secondary | ICD-10-CM

## 2012-06-22 DIAGNOSIS — G4730 Sleep apnea, unspecified: Secondary | ICD-10-CM

## 2012-06-22 DIAGNOSIS — M25559 Pain in unspecified hip: Secondary | ICD-10-CM

## 2012-06-22 DIAGNOSIS — E785 Hyperlipidemia, unspecified: Secondary | ICD-10-CM

## 2012-06-22 DIAGNOSIS — I10 Essential (primary) hypertension: Secondary | ICD-10-CM

## 2012-06-22 DIAGNOSIS — M25551 Pain in right hip: Secondary | ICD-10-CM

## 2012-06-22 DIAGNOSIS — IMO0001 Reserved for inherently not codable concepts without codable children: Secondary | ICD-10-CM

## 2012-06-22 DIAGNOSIS — M533 Sacrococcygeal disorders, not elsewhere classified: Secondary | ICD-10-CM

## 2012-06-22 LAB — CBC
MCH: 30.8 pg (ref 26.0–34.0)
MCHC: 33 g/dL (ref 30.0–36.0)
MCV: 93.4 fL (ref 78.0–100.0)
Platelets: 364 10*3/uL (ref 150–400)

## 2012-06-22 LAB — LIPID PANEL
HDL: 54 mg/dL (ref >39)
Triglycerides: 59 mg/dL (ref <150)

## 2012-06-22 LAB — PHOSPHORUS: Phosphorus: 3.2 mg/dL (ref 2.3–4.6)

## 2012-06-22 LAB — TSH: TSH: 2.639 u[IU]/mL (ref 0.350–4.500)

## 2012-06-22 LAB — HEPATIC FUNCTION PANEL
Albumin: 4 g/dL (ref 3.5–5.2)
Total Bilirubin: 0.7 mg/dL (ref 0.3–1.2)
Total Protein: 7 g/dL (ref 6.0–8.3)

## 2012-06-22 LAB — BASIC METABOLIC PANEL
BUN: 16 mg/dL (ref 6–23)
CO2: 28 mEq/L (ref 19–32)
Calcium: 9.5 mg/dL (ref 8.4–10.5)
Creat: 0.87 mg/dL (ref 0.50–1.10)
Glucose, Bld: 86 mg/dL (ref 70–99)

## 2012-06-22 MED ORDER — CYCLOBENZAPRINE HCL 10 MG PO TABS: 10.0000 mg | ORAL_TABLET | Freq: Every evening | ORAL | Status: DC | PRN

## 2012-06-22 MED ORDER — NAPROXEN 375 MG PO TABS: 375.0000 mg | ORAL_TABLET | Freq: Every day | ORAL | Status: DC | PRN

## 2012-06-22 MED ORDER — RANITIDINE HCL 300 MG PO TABS: 300.0000 mg | ORAL_TABLET | Freq: Every day | ORAL | Status: DC

## 2012-06-22 NOTE — Patient Instructions (Addendum)
DASH diet, consider   Gastroesophageal Reflux Disease, Adult Gastroesophageal reflux disease (GERD) happens when acid from your stomach flows up into the esophagus. When acid comes in contact with the esophagus, the acid causes soreness (inflammation) in the esophagus. Over time, GERD may create small holes (ulcers) in the lining of the esophagus. CAUSES   Increased body weight. This puts pressure on the stomach, making acid rise from the stomach into the esophagus.  Smoking. This increases acid production in the stomach.  Drinking alcohol. This causes decreased pressure in the lower esophageal sphincter (valve or ring of muscle between the esophagus and stomach), allowing acid from the stomach into the esophagus.  Late evening meals and a full stomach. This increases pressure and acid production in the stomach.  A malformed lower esophageal sphincter. Sometimes, no cause is found. SYMPTOMS   Burning pain in the lower part of the mid-chest behind the breastbone and in the mid-stomach area. This may occur twice a week or more often.  Trouble swallowing.  Sore throat.  Dry cough.  Asthma-like symptoms including chest tightness, shortness of breath, or wheezing. DIAGNOSIS  Your caregiver may be able to diagnose GERD based on your symptoms. In some cases, X-rays and other tests may be done to check for complications or to check the condition of your stomach and esophagus. TREATMENT  Your caregiver may recommend over-the-counter or prescription medicines to help decrease acid production. Ask your caregiver before starting or adding any new medicines.  HOME CARE INSTRUCTIONS   Change the factors that you can control. Ask your caregiver for guidance concerning weight loss, quitting smoking, and alcohol consumption.  Avoid foods and drinks that make your symptoms worse, such as:  Caffeine or alcoholic drinks.  Chocolate.  Peppermint or mint flavorings.  Garlic and onions.  Spicy  foods.  Citrus fruits, such as oranges, lemons, or limes.  Tomato-based foods such as sauce, chili, salsa, and pizza.  Fried and fatty foods.  Avoid lying down for the 3 hours prior to your bedtime or prior to taking a nap.  Eat small, frequent meals instead of large meals.  Wear loose-fitting clothing. Do not wear anything tight around your waist that causes pressure on your stomach.  Raise the head of your bed 6 to 8 inches with wood blocks to help you sleep. Extra pillows will not help.  Only take over-the-counter or prescription medicines for pain, discomfort, or fever as directed by your caregiver.  Do not take aspirin, ibuprofen, or other nonsteroidal anti-inflammatory drugs (NSAIDs). SEEK IMMEDIATE MEDICAL CARE IF:   You have pain in your arms, neck, jaw, teeth, or back.  Your pain increases or changes in intensity or duration.  You develop nausea, vomiting, or sweating (diaphoresis).  You develop shortness of breath, or you faint.  Your vomit is green, yellow, black, or looks like coffee grounds or blood.  Your stool is red, bloody, or black. These symptoms could be signs of other problems, such as heart disease, gastric bleeding, or esophageal bleeding. MAKE SURE YOU:   Understand these instructions.  Will watch your condition.  Will get help right away if you are not doing well or get worse. Document Released: 02/27/2005 Document Revised: 08/12/2011 Document Reviewed: 12/07/2010 [facility] Patient Information 2013 [place], [place]. Sacroiliac Joint Dysfunction The sacroiliac joint connects the lower part of the spine (the sacrum) with the bones of the pelvis. CAUSES  Sometimes, there is no obvious reason for sacroiliac joint dysfunction. Other times, it may occur  During pregnancy.  After injury, such as:  Car accidents.  Sport-related injuries.  Work-related injuries.  Due to one leg being shorter than the other.  Due to other conditions that affect  the joints, such as:  Rheumatoid arthritis.  Gout.  Psoriasis.  Joint infection (septic arthritis). SYMPTOMS  Symptoms may include:  Pain in the:  Lower back.  Buttocks.  Groin.  Thighs and legs.  Difficult sitting, standing, walking, lying, bending or lifting. DIAGNOSIS  A number of tests may be used to help diagnose the cause of sacroiliac joint dysfunction, including:  Imaging tests to look for other causes of pain, including:  MRI.  CT scan.  Bone scan.  Diagnostic injection: During a special x-ray (called fluoroscopy), a needle is put into the sacroiliac joint. A numbing medicine is injected into the joint. If the pain is improved or stopped, the diagnosis of sacroiliac joint dysfunction is more likely. TREATMENT  There are a number of types of treatment used for sacroiliac joint dysfunction, including:  Only take over-the-counter or prescription medicines for pain, discomfort, or fever as directed by your caregiver.  Medications to relax muscles.  Rest. Decreasing activity can help cut down on painful muscle spasms and allow the back to heal.  Application of heat or ice to the lower back may improve muscle spasms and soothe pain.  Brace. A special back brace, called a sacroiliac belt, can help support the joint while your back is healing.  Physical therapy can help teach comfortable positions and exercises to strengthen muscles that support the sacroiliac joint.  Cortisone injections. Injections of steroid medicine into the joint can help decrease swelling and improve pain.  Hyaluronic acid injections. This chemical improves lubrication within the sacroiliac joint, thereby decreasing pain.  Radiofrequency ablation. A special needle is placed into the joint, where it burns away nerves that are carrying pain messages from the joint.  Surgery. Because pain occurs during movement of the joint, screws and plates may be installed in order to limit or prevent  joint motion. HOME CARE INSTRUCTIONS   Take all medications exactly as directed.  Follow instructions regarding both rest and physical activity, to avoid worsening the pain.  Do physical therapy exercises exactly as prescribed. SEEK IMMEDIATE MEDICAL CARE IF:  You experience increasingly severe pain.  You develop new symptoms, such as numbness or tingling in your legs or feet.  You lose bladder or bowel control. Document Released: 08/16/2008 Document Revised: 08/12/2011 Document Reviewed: 08/16/2008 [facility] Patient Information 2013 [place], [place].

## 2012-06-28 ENCOUNTER — Encounter: Payer: Self-pay | Admitting: Family Medicine

## 2012-06-28 DIAGNOSIS — M25551 Pain in right hip: Secondary | ICD-10-CM | POA: Insufficient documentation

## 2012-06-28 DIAGNOSIS — G4730 Sleep apnea, unspecified: Secondary | ICD-10-CM

## 2012-06-28 DIAGNOSIS — G4733 Obstructive sleep apnea (adult) (pediatric): Secondary | ICD-10-CM | POA: Insufficient documentation

## 2012-06-28 DIAGNOSIS — H9319 Tinnitus, unspecified ear: Secondary | ICD-10-CM | POA: Insufficient documentation

## 2012-06-28 HISTORY — DX: Sleep apnea, unspecified: G47.30

## 2012-06-28 HISTORY — DX: Pain in right hip: M25.551

## 2012-06-28 NOTE — Assessment & Plan Note (Signed)
encouraged moist heat and gentle stretching, Meloxicam sparingly.

## 2012-06-28 NOTE — Assessment & Plan Note (Signed)
Well controlled, no changes 

## 2012-06-28 NOTE — Assessment & Plan Note (Signed)
Needs to consider new sleep study and possible treatment. She will consider referral at next visit, declines today

## 2012-06-28 NOTE — Assessment & Plan Note (Signed)
Increase exercise and avoid simple carbs and trans and saturated fats.

## 2012-06-28 NOTE — Progress Notes (Signed)
[name], female   DOB: 06-Feb-1951, 62 y.o.   MRN: [id number] JAYMA VOLPI [id number] 1951-02-01 06/28/2012      Progress Note-Follow Up  Subjective  Chief Complaint  Chief Complaint  Patient presents with  . Annual Exam    physical    HPI  Patient is a 62 year old Caucasian female who is here today for annual exam. Her biggest concern is some right hip pain. She reports several months ago checks he fell down some steps and turned her left ankle. She has chronic back pain but that did not worsen. A few weeks after this episode she began noticing menorrhagia with her right hip. She notes the pain is clear to what degree most days but is worse with position changes. She notes from sit to stand is the most painful. No numbness healing or weakness in either leg. No incontinence. No other musculoskeletal complaints are noted. She does note her reflux is worsening recently she is noting that frequently at night she has a problem with reflux and nausea even to the point of throwing up intermittently at night she has not had any medications or dietary changes for this. Has been using meloxicam. Infrequently for her hip pain. No chronic chest pain, shortness of breath, palpitations, and GU complaints are noted  Past Medical History  Diagnosis Date  . Hypertension   . Obesity   . Allergic rhinitis     Past Surgical History  Procedure Date  . Childbirth     Family History  Problem Relation Age of Onset  . Stroke Mother   . Liver disease Mother     hepatitis b  . Cancer Father     esophagus, prostate  . Liver disease Brother     hep c  . Mental illness Brother     PTSD from [place]  . Stroke Maternal Grandmother   . Cancer Paternal Grandfather     prostate    History   Social History  . Marital Status: Single    Spouse Name: N/A    Number of Children: N/A  . Years of Education: N/A   Occupational History  . HS music teacher- [employer]    Social  History Main Topics  . Smoking status: Never Smoker   . Smokeless tobacco: Not on file  . Alcohol Use: Yes     Comment: social  . Drug Use: No  . Sexually Active: No   Other Topics Concern  . Not on file   Social History Narrative  . No narrative on file    Current Outpatient Prescriptions on File Prior to Visit  Medication Sig Dispense Refill  . amLODipine-olmesartan (AZOR) 10-40 MG per tablet Take 1 tablet by mouth daily.  90 tablet  2  . omeprazole (PRILOSEC) 40 MG capsule Take 1 capsule (40 mg total) by mouth daily.  30 capsule  6  . ranitidine (ZANTAC) 300 MG tablet Take 1 tablet (300 mg total) by mouth at bedtime.  30 tablet  5    Allergies  Allergen Reactions  . Sulfonamide Derivatives     Review of Systems  Review of Systems  Constitutional: Negative for fever and malaise/fatigue.  HENT: Negative for congestion.   Eyes: Negative for discharge.  Respiratory: Negative for shortness of breath.   Cardiovascular: Negative for chest pain, palpitations and leg swelling.  Gastrointestinal: Positive for heartburn, nausea and vomiting. Negative for abdominal pain and diarrhea.  Genitourinary: Negative for dysuria.  Musculoskeletal: Positive for  joint pain. Negative for falls.       Right hip pain worst with changes in position especially sit to stand. Did have a fall down some stairs about 3 months ago and sprained her left ankle her right hip began a couple of weeks after that.   Skin: Negative for rash.  Neurological: Negative for loss of consciousness and headaches.  Endo/Heme/Allergies: Negative for polydipsia.  Psychiatric/Behavioral: Negative for depression and suicidal ideas. The patient is not nervous/anxious and does not have insomnia.     Objective  BP 128/82  Pulse 69  Temp 97.6 F (36.4 C) (Oral)  Ht 5\' 5"  (1.651 m)  Wt 249 lb (112.946 kg)  BMI 41.44 kg/m2  SpO2 97%  Physical Exam  Physical Exam  Lab Results  Component Value Date   TSH 2.639  06/22/2012   Lab Results  Component Value Date   WBC 8.1 06/22/2012   HGB 13.0 06/22/2012   HCT 39.4 06/22/2012   MCV 93.4 06/22/2012   PLT 364 06/22/2012   Lab Results  Component Value Date   CREATININE 0.87 06/22/2012   BUN 16 06/22/2012   NA 143 06/22/2012   K 4.4 06/22/2012   CL 108 06/22/2012   CO2 28 06/22/2012   Lab Results  Component Value Date   ALT 11 06/22/2012   AST 13 06/22/2012   ALKPHOS 83 06/22/2012   BILITOT 0.7 06/22/2012   Lab Results  Component Value Date   CHOL 148 06/22/2012   Lab Results  Component Value Date   HDL 54 06/22/2012   Lab Results  Component Value Date   LDLCALC 82 06/22/2012   Lab Results  Component Value Date   TRIG 59 06/22/2012   Lab Results  Component Value Date   CHOLHDL 2.7 06/22/2012     Assessment & Plan  GERD Having trouble with increased reflux worst qhs. Encouraged small, frequent meals, avoid food in evenings. Try Ranitidine qhs. Continue Omeprazole in am, if symptoms persist may need referral to gastroenterology for further consideration.  HYPERTENSION Well controlled, no changes.  HYPERLIPIDEMIA Numbers in normal limits past few years, avoid trans fats and maintain adequate exercise.   OBESITY Increase exercise and avoid simple carbs and trans and saturated fats.  Annual physical exam Encouraged regular exercise, heart healthy diet and 8 hours of sleep nightly.  Hip pain, right encouraged moist heat and gentle stretching, Meloxicam sparingly.   Apnea, sleep Needs to consider new sleep study and possible treatment. She will consider referral at next visit, declines today

## 2012-06-28 NOTE — Assessment & Plan Note (Signed)
Having trouble with increased reflux worst qhs. Encouraged small, frequent meals, avoid food in evenings. Try Ranitidine qhs. Continue Omeprazole in am, if symptoms persist may need referral to gastroenterology for further consideration.

## 2012-06-28 NOTE — Assessment & Plan Note (Signed)
Numbers in normal limits past few years, avoid trans fats and maintain adequate exercise.

## 2012-06-28 NOTE — Assessment & Plan Note (Signed)
Encouraged regular exercise, heart healthy diet and 8 hours of sleep nightly.

## 2012-11-25 ENCOUNTER — Other Ambulatory Visit: Payer: Self-pay | Admitting: Internal Medicine

## 2012-12-21 ENCOUNTER — Ambulatory Visit (INDEPENDENT_AMBULATORY_CARE_PROVIDER_SITE_OTHER): Payer: BC Managed Care – PPO | Admitting: Family Medicine

## 2012-12-21 ENCOUNTER — Encounter: Payer: Self-pay | Admitting: Family Medicine

## 2012-12-21 VITALS — BP 118/78 | HR 71 | Temp 98.3°F | Ht 65.0 in | Wt 252.1 lb

## 2012-12-21 DIAGNOSIS — E669 Obesity, unspecified: Secondary | ICD-10-CM

## 2012-12-21 DIAGNOSIS — E785 Hyperlipidemia, unspecified: Secondary | ICD-10-CM

## 2012-12-21 DIAGNOSIS — K219 Gastro-esophageal reflux disease without esophagitis: Secondary | ICD-10-CM

## 2012-12-21 DIAGNOSIS — G4730 Sleep apnea, unspecified: Secondary | ICD-10-CM

## 2012-12-21 DIAGNOSIS — R609 Edema, unspecified: Secondary | ICD-10-CM

## 2012-12-21 DIAGNOSIS — I10 Essential (primary) hypertension: Secondary | ICD-10-CM

## 2012-12-21 NOTE — Progress Notes (Signed)
[name], female   DOB: [birth year], 62 y.o.   MRN: [id number] DONNAMAE MUILENBURG [id number] November 09, 1950 12/21/2012      Progress Note-Follow Up  Subjective  Chief Complaint  Chief Complaint  Patient presents with  . Follow-up    6 month    HPI   Patient is a 62 yo female she is in today for routine followup. She generally feels well. She did have some low-grade pain tolerable. Struggles with mild fatigue but denies worsening snoring. No headaches. No chest pain, palpitations, shortness of breath, GI or GU complaints. Prilosec and Zantac for controlling her dyspepsia and naproxen helps with her ongoing pain. No recent flare and vertigo allergies or asthma.  Past Medical History  Diagnosis Date  . Hypertension   . Obesity   . Allergic rhinitis   . Hip pain, right 06/28/2012  . Apnea, sleep 06/28/2012    Previously used CPAP    Past Surgical History  Procedure Laterality Date  . Childbirth      Family History  Problem Relation Age of Onset  . Stroke Mother   . Liver disease Mother     hepatitis b  . Cancer Father     esophagus, prostate  . Liver disease Brother     hep c  . Mental illness Brother     PTSD from [place]  . Stroke Maternal Grandmother   . Cancer Paternal Grandfather     prostate    History   Social History  . Marital Status: Single    Spouse Name: N/A    Number of Children: N/A  . Years of Education: N/A   Occupational History  . HS music teacher- [employer]    Social History Main Topics  . Smoking status: Never Smoker   . Smokeless tobacco: Not on file  . Alcohol Use: Yes     Comment: social  . Drug Use: No  . Sexually Active: No   Other Topics Concern  . Not on file   Social History Narrative  . No narrative on file    Current Outpatient Prescriptions on File Prior to Visit  Medication Sig Dispense Refill  . amLODipine-olmesartan (AZOR) 10-40 MG per tablet Take 1 tablet by mouth daily.  90 tablet  2  .  cyclobenzaprine (FLEXERIL) 10 MG tablet Take 1 tablet (10 mg total) by mouth at bedtime as needed for muscle spasms.  30 tablet  4  . naproxen (NAPROSYN) 375 MG tablet Take 1 tablet (375 mg total) by mouth daily as needed. In am with food  30 tablet  4  . omeprazole (PRILOSEC) 40 MG capsule TAKE 1 CAPSULE (40 MG TOTAL) BY MOUTH DAILY.  30 capsule  6  . ranitidine (ZANTAC) 300 MG tablet Take 1 tablet (300 mg total) by mouth at bedtime.  30 tablet  5   No current facility-administered medications on file prior to visit.    Allergies  Allergen Reactions  . Sulfonamide Derivatives     Review of Systems  Review of Systems  Constitutional: Negative for fever and malaise/fatigue.  HENT: Negative for congestion.   Eyes: Negative for pain and discharge.  Respiratory: Negative for hemoptysis and shortness of breath.   Cardiovascular: Negative for chest pain, palpitations and leg swelling.  Gastrointestinal: Negative for nausea, abdominal pain and diarrhea.  Genitourinary: Negative for dysuria.  Musculoskeletal: Negative for falls.  Skin: Negative for rash.  Neurological: Negative for loss of consciousness and headaches.  Endo/Heme/Allergies: Negative for  polydipsia.  Psychiatric/Behavioral: Negative for depression and suicidal ideas. The patient is not nervous/anxious and does not have insomnia.     Objective  BP 118/78  Pulse 71  Temp(Src) 98.3 F (36.8 C) (Oral)  Ht 5\' 5"  (1.651 m)  Wt 252 lb 1.9 oz (114.361 kg)  BMI 41.96 kg/m2  SpO2 95%  Physical Exam  Physical Exam  Constitutional: She is oriented to person, place, and time and well-developed, well-nourished, and in no distress. No distress.  HENT:  Head: Normocephalic and atraumatic.  Eyes: Conjunctivae are normal.  Neck: Neck supple. No thyromegaly present.  Cardiovascular: Normal rate and regular rhythm.  Exam reveals no gallop.   No murmur heard. Pulmonary/Chest: Effort normal and breath sounds normal. She has no  wheezes.  Abdominal: She exhibits no distension and no mass.  Musculoskeletal: She exhibits no edema.  Lymphadenopathy:    She has no cervical adenopathy.  Neurological: She is alert and oriented to person, place, and time.  Skin: Skin is warm and dry. No rash noted. She is not diaphoretic.  Psychiatric: Memory, affect and judgment normal.    Lab Results  Component Value Date   TSH 2.639 06/22/2012   Lab Results  Component Value Date   WBC 8.1 06/22/2012   HGB 13.0 06/22/2012   HCT 39.4 06/22/2012   MCV 93.4 06/22/2012   PLT 364 06/22/2012   Lab Results  Component Value Date   CREATININE 0.87 06/22/2012   BUN 16 06/22/2012   NA 143 06/22/2012   K 4.4 06/22/2012   CL 108 06/22/2012   CO2 28 06/22/2012   Lab Results  Component Value Date   ALT 11 06/22/2012   AST 13 06/22/2012   ALKPHOS 83 06/22/2012   BILITOT 0.7 06/22/2012   Lab Results  Component Value Date   CHOL 148 06/22/2012   Lab Results  Component Value Date   HDL 54 06/22/2012   Lab Results  Component Value Date   LDLCALC 82 06/22/2012   Lab Results  Component Value Date   TRIG 59 06/22/2012   Lab Results  Component Value Date   CHOLHDL 2.7 06/22/2012     Assessment & Plan  HYPERTENSION Well controlled, no changes to meds today.   OBESITY Encouraged DASH diet and increased exercise, avoid trans fats  HYPERLIPIDEMIA No concerns on last check, repeat lipid panel piror to next visit.  GERD Well controlled with current meds, avoid offending foods  EDEMA Consider compression hose  Apnea, sleep Not presently treating, declines repeat sleep study today

## 2012-12-21 NOTE — Assessment & Plan Note (Signed)
Well controlled with current meds, avoid offending foods 

## 2012-12-21 NOTE — Patient Instructions (Addendum)
Consider nasal saline  DASH Diet The DASH diet stands for "Dietary Approaches to Stop Hypertension." It is a healthy eating plan that has been shown to reduce high blood pressure (hypertension) in as little as 14 days, while also possibly providing other significant health benefits. These other health benefits include reducing the risk of breast cancer after menopause and reducing the risk of type 2 diabetes, heart disease, colon cancer, and stroke. Health benefits also include weight loss and slowing kidney failure in patients with chronic kidney disease.  DIET GUIDELINES  Limit salt (sodium). Your diet should contain less than 1500 mg of sodium daily.  Limit refined or processed carbohydrates. Your diet should include mostly whole grains. Desserts and added sugars should be used sparingly.  Include small amounts of heart-healthy fats. These types of fats include nuts, oils, and tub margarine. Limit saturated and trans fats. These fats have been shown to be harmful in the body. CHOOSING FOODS  The following food groups are based on a 2000 calorie diet. See your Registered Dietitian for individual calorie needs. Grains and Grain Products (6 to 8 servings daily)  Eat More Often: Whole-wheat bread, brown rice, whole-grain or wheat pasta, quinoa, popcorn without added fat or salt (air popped).  Eat Less Often: White bread, white pasta, white rice, cornbread. Vegetables (4 to 5 servings daily)  Eat More Often: Fresh, frozen, and canned vegetables. Vegetables may be raw, steamed, roasted, or grilled with a minimal amount of fat.  Eat Less Often/Avoid: Creamed or fried vegetables. Vegetables in a cheese sauce. Fruit (4 to 5 servings daily)  Eat More Often: All fresh, canned (in natural juice), or frozen fruits. Dried fruits without added sugar. One hundred percent fruit juice ( cup [237 mL] daily).  Eat Less Often: Dried fruits with added sugar. Canned fruit in light or heavy syrup. [employer], Fish, and Poultry (2 servings or less daily. One serving is 3 to 4 oz [85-114 g]).  Eat More Often: Ninety percent or leaner ground beef, tenderloin, sirloin. Round cuts of beef, chicken breast, [place] breast. All fish. Grill, bake, or broil your meat. Nothing should be fried.  Eat Less Often/Avoid: Fatty cuts of meat, [place], or chicken leg, thigh, or wing. Fried cuts of meat or fish. Dairy (2 to 3 servings)  Eat More Often: Low-fat or fat-free milk, low-fat plain or light yogurt, reduced-fat or part-skim cheese.  Eat Less Often/Avoid: Milk (whole, 2%).Whole milk yogurt. Full-fat cheeses. Nuts, Seeds, and Legumes (4 to 5 servings per week)  Eat More Often: All without added salt.  Eat Less Often/Avoid: Salted nuts and seeds, canned beans with added salt. Fats and Sweets (limited)  Eat More Often: Vegetable oils, tub margarines without trans fats, sugar-free gelatin. Mayonnaise and salad dressings.  Eat Less Often/Avoid: Coconut oils, palm oils, butter, stick margarine, cream, half and half, cookies, candy, pie. FOR MORE INFORMATION The Dash Diet Eating Plan: www.dashdiet.org Document Released: 05/09/2011 Document Revised: 08/12/2011 Document Reviewed: 05/09/2011 [facility] Patient Information 2014 [place], [place].

## 2012-12-21 NOTE — Assessment & Plan Note (Signed)
Consider compression hose

## 2012-12-21 NOTE — Assessment & Plan Note (Signed)
Encouraged DASH diet and increased exercise, avoid trans fats

## 2012-12-21 NOTE — Assessment & Plan Note (Signed)
No concerns on last check, repeat lipid panel piror to next visit.

## 2012-12-21 NOTE — Assessment & Plan Note (Signed)
Well controlled, no changes to meds today 

## 2012-12-21 NOTE — Assessment & Plan Note (Signed)
Not presently treating, declines repeat sleep study today

## 2013-02-28 ENCOUNTER — Other Ambulatory Visit: Payer: Self-pay | Admitting: Internal Medicine

## 2013-03-01 ENCOUNTER — Telehealth: Payer: Self-pay | Admitting: *Deleted

## 2013-03-01 NOTE — Telephone Encounter (Signed)
Rx request to pharmacy/SLS  

## 2013-03-01 NOTE — Telephone Encounter (Signed)
Called patient concerning PA for Omeprazole. Need to know what other medications patient has tried in the past.

## 2013-03-26 ENCOUNTER — Telehealth: Payer: Self-pay

## 2013-03-26 NOTE — Telephone Encounter (Signed)
Left a message for pt stating we need to know all medication tried and failed for a PA for Omeprazole. Waiting for return call. Paperwork put on MD's desk

## 2013-04-02 ENCOUNTER — Telehealth: Payer: Self-pay

## 2013-04-02 NOTE — Telephone Encounter (Signed)
Spoke with client concerning other medications that she has taken or tried for heart burn and patient stated that the only two medications that she is a wear of is Prilosec and Nexium.  PA form faxed.

## 2013-04-05 NOTE — Telephone Encounter (Signed)
Received paperwork from [facility] stating pt did not need a pa for this

## 2013-04-30 ENCOUNTER — Encounter: Payer: Self-pay | Admitting: Family Medicine

## 2013-05-24 ENCOUNTER — Telehealth: Payer: Self-pay | Admitting: Family Medicine

## 2013-05-24 NOTE — Telephone Encounter (Signed)
PA form faxed over for Omeprazole DR 40mg , take 1 tab by mouth daily QTY30 with 6 refills, forwarded to nurse

## 2013-06-18 NOTE — Telephone Encounter (Signed)
Have you heard anything back on this PA? 

## 2013-06-18 NOTE — Telephone Encounter (Signed)
No I have not, I have not seen it since I sent it back. °

## 2013-06-21 ENCOUNTER — Encounter: Payer: BC Managed Care – PPO | Admitting: Family Medicine

## 2013-07-07 NOTE — Telephone Encounter (Signed)
Spoke with pt. She states she has not tried any other alternatives. Has been on Omeprazole since 2012 and symptoms currently well controlled. Notes n/v and has to sleep inclined when she is without medication.  Form completed and faxed to [facility] at [phone].  Awaiting approval / denial status.

## 2013-07-08 NOTE — Telephone Encounter (Signed)
Omeprazole is approved effective 06-16-13 through 07-07-14

## 2013-07-21 ENCOUNTER — Other Ambulatory Visit: Payer: Self-pay | Admitting: Family Medicine

## 2013-10-14 ENCOUNTER — Other Ambulatory Visit: Payer: Self-pay | Admitting: Family Medicine

## 2013-11-18 ENCOUNTER — Ambulatory Visit (INDEPENDENT_AMBULATORY_CARE_PROVIDER_SITE_OTHER): Payer: BC Managed Care – PPO | Admitting: Physician Assistant

## 2013-11-18 ENCOUNTER — Encounter: Payer: Self-pay | Admitting: Physician Assistant

## 2013-11-18 VITALS — BP 124/74 | HR 72 | Temp 98.0°F | Resp 16 | Ht 65.0 in | Wt 250.0 lb

## 2013-11-18 DIAGNOSIS — J0110 Acute frontal sinusitis, unspecified: Secondary | ICD-10-CM

## 2013-11-18 MED ORDER — BENZONATATE 100 MG PO CAPS: 100.0000 mg | ORAL_CAPSULE | Freq: Two times a day (BID) | ORAL | Status: DC | PRN

## 2013-11-18 MED ORDER — AZITHROMYCIN 250 MG PO TABS: ORAL_TABLET | ORAL | Status: DC

## 2013-11-18 NOTE — Progress Notes (Signed)
Pre visit review using our clinic review tool, if applicable. No additional management support is needed unless otherwise documented below in the visit note/SLS  

## 2013-11-18 NOTE — Patient Instructions (Signed)
Please take antibiotic as directed.  Increase fluid intake.  Use Saline nasal spray.  Take a daily multivitamin. Use Tessalon as directed for cough.  Place a humidifier in the bedroom.  Follow-up with your ENT if things are not improving.  Sinusitis Sinusitis is redness, soreness, and swelling (inflammation) of the paranasal sinuses. Paranasal sinuses are air pockets within the bones of your face (beneath the eyes, the middle of the forehead, or above the eyes). In healthy paranasal sinuses, mucus is able to drain out, and air is able to circulate through them by way of your nose. However, when your paranasal sinuses are inflamed, mucus and air can become trapped. This can allow bacteria and other germs to grow and cause infection. Sinusitis can develop quickly and last only a short time (acute) or continue over a long period (chronic). Sinusitis that lasts for more than 12 weeks is considered chronic.  CAUSES  Causes of sinusitis include:  Allergies.  Structural abnormalities, such as displacement of the cartilage that separates your nostrils (deviated septum), which can decrease the air flow through your nose and sinuses and affect sinus drainage.  Functional abnormalities, such as when the small hairs (cilia) that line your sinuses and help remove mucus do not work properly or are not present. SYMPTOMS  Symptoms of acute and chronic sinusitis are the same. The primary symptoms are pain and pressure around the affected sinuses. Other symptoms include:  Upper toothache.  Earache.  Headache.  Bad breath.  Decreased sense of smell and taste.  A cough, which worsens when you are lying flat.  Fatigue.  Fever.  Thick drainage from your nose, which often is green and may contain pus (purulent).  Swelling and warmth over the affected sinuses. DIAGNOSIS  Your caregiver will perform a physical exam. During the exam, your caregiver may:  Look in your nose for signs of abnormal growths in  your nostrils (nasal polyps).  Tap over the affected sinus to check for signs of infection.  View the inside of your sinuses (endoscopy) with a special imaging device with a light attached (endoscope), which is inserted into your sinuses. If your caregiver suspects that you have chronic sinusitis, one or more of the following tests may be recommended:  Allergy tests.  Nasal culture A sample of mucus is taken from your nose and sent to a lab and screened for bacteria.  Nasal cytology A sample of mucus is taken from your nose and examined by your caregiver to determine if your sinusitis is related to an allergy. TREATMENT  Most cases of acute sinusitis are related to a viral infection and will resolve on their own within 10 days. Sometimes medicines are prescribed to help relieve symptoms (pain medicine, decongestants, nasal steroid sprays, or saline sprays).  However, for sinusitis related to a bacterial infection, your caregiver will prescribe antibiotic medicines. These are medicines that will help kill the bacteria causing the infection.  Rarely, sinusitis is caused by a fungal infection. In theses cases, your caregiver will prescribe antifungal medicine. For some cases of chronic sinusitis, surgery is needed. Generally, these are cases in which sinusitis recurs more than 3 times per year, despite other treatments. HOME CARE INSTRUCTIONS   Drink plenty of water. Water helps thin the mucus so your sinuses can drain more easily.  Use a humidifier.  Inhale steam 3 to 4 times a day (for example, sit in the bathroom with the shower running).  Apply a warm, moist washcloth to your face 3  to 4 times a day, or as directed by your caregiver.  Use saline nasal sprays to help moisten and clean your sinuses.  Take over-the-counter or prescription medicines for pain, discomfort, or fever only as directed by your caregiver. SEEK IMMEDIATE MEDICAL CARE IF:  You have increasing pain or severe  headaches.  You have nausea, vomiting, or drowsiness.  You have swelling around your face.  You have vision problems.  You have a stiff neck.  You have difficulty breathing. MAKE SURE YOU:   Understand these instructions.  Will watch your condition.  Will get help right away if you are not doing well or get worse. Document Released: 05/20/2005 Document Revised: 08/12/2011 Document Reviewed: 06/04/2011 [facility] Patient Information 2014 [place], [place].

## 2013-11-18 NOTE — Progress Notes (Signed)
Patient presents to clinic today c/o sinus pressure, sinus pain, ear pain and fatigue x 4-5 weeks. Was seen at [facility] 3 weeks ago and diagnosed with sinusitis.  Was given prescription for amoxicillin.  States symptoms improved somewhat while on antibiotics.  After antibiotic was complete, patient states symptoms came back. Now having tooth pain.  Denies SOB, wheezing or pleuritic chest pain. Denies recent travel or sick contact.  Past Medical History  Diagnosis Date  . Hypertension   . Obesity   . Allergic rhinitis   . Hip pain, right 06/28/2012  . Apnea, sleep 06/28/2012    Previously used CPAP    Current Outpatient Prescriptions on File Prior to Visit  Medication Sig Dispense Refill  . AZOR 10-40 MG per tablet TAKE 1 TABLET BY MOUTH EVERY DAY  90 tablet  0  . cyclobenzaprine (FLEXERIL) 10 MG tablet Take 1 tablet (10 mg total) by mouth at bedtime as needed for muscle spasms.  30 tablet  4  . naproxen (NAPROSYN) 375 MG tablet Take 1 tablet (375 mg total) by mouth daily as needed. In am with food  30 tablet  4  . omeprazole (PRILOSEC) 40 MG capsule TAKE 1 CAPSULE (40 MG TOTAL) BY MOUTH DAILY.  30 capsule  6   No current facility-administered medications on file prior to visit.    Allergies  Allergen Reactions  . Sulfonamide Derivatives     Family History  Problem Relation Age of Onset  . Stroke Mother   . Liver disease Mother     hepatitis b  . Cancer Father     esophagus, prostate  . Liver disease Brother     hep c  . Mental illness Brother     PTSD from [place]  . Stroke Maternal Grandmother   . Cancer Paternal Grandfather     prostate    History   Social History  . Marital Status: Single    Spouse Name: N/A    Number of Children: N/A  . Years of Education: N/A   Occupational History  . 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 Smoking status: Never Smoker   . Smokeless tobacco: None  . Alcohol Use: Yes     Comment: social  . Drug Use: No   . Sexual Activity: No   Other Topics Concern  . None   Social History Narrative  . None   Review of Systems - See HPI.  All other ROS are negative.  BP 124/74  Pulse 72  Temp(Src) 98 F (36.7 C) (Oral)  Resp 16  Ht 5\' 5"  (1.651 m)  Wt 250 lb (113.399 kg)  BMI 41.60 kg/m2  SpO2 98%  Physical Exam  Vitals reviewed. Constitutional: She is oriented to person, place, and time and well-developed, well-nourished, and in no distress.  HENT:  Head: Normocephalic and atraumatic.  Right Ear: External ear normal.  Left Ear: External ear normal.  Nose: Nose normal.  Mouth/Throat: Oropharynx is clear and moist. No oropharyngeal exudate.  TM within normal limits bilaterally.  + TTP of sinuses on exam.  + PND.  Eyes: Conjunctivae are normal.  Neck: Neck supple.  Cardiovascular: Normal rate, regular rhythm, normal heart sounds and intact distal pulses.   Pulmonary/Chest: Effort normal and breath sounds normal. No respiratory distress. She has no wheezes. She has no rales. She exhibits no tenderness.  Lymphadenopathy:    She has no cervical adenopathy.  Neurological: She is alert and oriented to  person, place, and time.  Skin: Skin is warm and dry. No rash noted.  Psychiatric: Affect normal.   Assessment/Plan: Acute frontal sinusitis Rx Azithromycin.  Increase fluids.  Rest.  Rx Tessalon for cough. Mucinex.  Humidifier in bedroom. Saline nasal spray and probiotic.  If symptoms persist despite antibiotic, will need ENT evaluation.

## 2013-11-21 DIAGNOSIS — J0110 Acute frontal sinusitis, unspecified: Secondary | ICD-10-CM | POA: Insufficient documentation

## 2013-11-21 NOTE — Assessment & Plan Note (Signed)
Rx Azithromycin.  Increase fluids.  Rest.  Rx Tessalon for cough. Mucinex.  Humidifier in bedroom. Saline nasal spray and probiotic.  If symptoms persist despite antibiotic, will need ENT evaluation.

## 2013-12-09 ENCOUNTER — Ambulatory Visit (INDEPENDENT_AMBULATORY_CARE_PROVIDER_SITE_OTHER): Payer: BC Managed Care – PPO | Admitting: Family Medicine

## 2013-12-09 ENCOUNTER — Encounter: Payer: Self-pay | Admitting: Family Medicine

## 2013-12-09 VITALS — BP 120/84 | HR 70 | Temp 98.1°F | Ht 65.0 in | Wt 253.0 lb

## 2013-12-09 DIAGNOSIS — I10 Essential (primary) hypertension: Secondary | ICD-10-CM

## 2013-12-09 DIAGNOSIS — G4730 Sleep apnea, unspecified: Secondary | ICD-10-CM

## 2013-12-09 DIAGNOSIS — E785 Hyperlipidemia, unspecified: Secondary | ICD-10-CM

## 2013-12-09 DIAGNOSIS — K219 Gastro-esophageal reflux disease without esophagitis: Secondary | ICD-10-CM

## 2013-12-09 DIAGNOSIS — R252 Cramp and spasm: Secondary | ICD-10-CM

## 2013-12-09 DIAGNOSIS — Z0000 Encounter for general adult medical examination without abnormal findings: Secondary | ICD-10-CM

## 2013-12-09 DIAGNOSIS — R609 Edema, unspecified: Secondary | ICD-10-CM

## 2013-12-09 DIAGNOSIS — L989 Disorder of the skin and subcutaneous tissue, unspecified: Secondary | ICD-10-CM | POA: Insufficient documentation

## 2013-12-09 DIAGNOSIS — E669 Obesity, unspecified: Secondary | ICD-10-CM

## 2013-12-09 LAB — CBC
HEMATOCRIT: 37.7 % (ref 36.0–46.0)
Hemoglobin: 13 g/dL (ref 12.0–15.0)
MCH: 30.9 pg (ref 26.0–34.0)
MCHC: 34.5 g/dL (ref 30.0–36.0)
MCV: 89.5 fL (ref 78.0–100.0)
PLATELETS: 373 10*3/uL (ref 150–400)
RBC: 4.21 MIL/uL (ref 3.87–5.11)
RDW: 15.3 % (ref 11.5–15.5)
WBC: 7.6 10*3/uL (ref 4.0–10.5)

## 2013-12-09 LAB — LIPID PANEL
Cholesterol: 151 mg/dL (ref 0–200)
HDL: 52 mg/dL (ref >39)
LDL Cholesterol: 88 mg/dL (ref 0–99)
TRIGLYCERIDES: 54 mg/dL (ref <150)
Total CHOL/HDL Ratio: 2.9 Ratio
VLDL: 11 mg/dL (ref 0–40)

## 2013-12-09 LAB — RENAL FUNCTION PANEL
ALBUMIN: 3.8 g/dL (ref 3.5–5.2)
BUN: 12 mg/dL (ref 6–23)
CALCIUM: 9.1 mg/dL (ref 8.4–10.5)
CO2: 27 mEq/L (ref 19–32)
Chloride: 107 mEq/L (ref 96–112)
Creat: 0.85 mg/dL (ref 0.50–1.10)
Glucose, Bld: 97 mg/dL (ref 70–99)
PHOSPHORUS: 3.5 mg/dL (ref 2.3–4.6)
Potassium: 3.9 mEq/L (ref 3.5–5.3)
Sodium: 142 mEq/L (ref 135–145)

## 2013-12-09 LAB — HEPATIC FUNCTION PANEL
ALBUMIN: 3.8 g/dL (ref 3.5–5.2)
ALK PHOS: 79 U/L (ref 39–117)
ALT: 19 U/L (ref 0–35)
AST: 16 U/L (ref 0–37)
BILIRUBIN TOTAL: 0.5 mg/dL (ref 0.2–1.2)
Bilirubin, Direct: 0.1 mg/dL (ref 0.0–0.3)
Indirect Bilirubin: 0.4 mg/dL (ref 0.2–1.2)
Total Protein: 6.8 g/dL (ref 6.0–8.3)

## 2013-12-09 LAB — MAGNESIUM: MAGNESIUM: 2.1 mg/dL (ref 1.5–2.5)

## 2013-12-09 LAB — TSH: TSH: 1.832 u[IU]/mL (ref 0.350–4.500)

## 2013-12-09 MED ORDER — FUROSEMIDE 20 MG PO TABS: 20.0000 mg | ORAL_TABLET | Freq: Every day | ORAL | Status: DC | PRN

## 2013-12-09 MED ORDER — OMEPRAZOLE 40 MG PO CPDR: 40.0000 mg | DELAYED_RELEASE_CAPSULE | Freq: Every day | ORAL | Status: DC

## 2013-12-09 NOTE — Assessment & Plan Note (Signed)
Well controlled, no changes to meds. Encouraged heart healthy diet such as the DASH diet and exercise as tolerated.  °

## 2013-12-09 NOTE — Progress Notes (Signed)
Pre visit review using our clinic review tool, if applicable. No additional management support is needed unless otherwise documented below in the visit note. 

## 2013-12-09 NOTE — Assessment & Plan Note (Signed)
Encouraged heart healthy diet, increase exercise, avoid trans fats, consider a krill oil cap daily 

## 2013-12-09 NOTE — Assessment & Plan Note (Signed)
Minimize sodium elevate feet above heart, consider compression hose and given lasix to use prn

## 2013-12-09 NOTE — Progress Notes (Signed)
[name], female   DOB: April 03, 1951, 63 y.o.   MRN: [id number] Terri [name] [id number] [birth year] 12/09/2013      Progress Note-Follow Up  Subjective  Chief Complaint  Chief Complaint  Patient presents with  . Annual Exam    physical    HPI  Patient is a 63 year old female in today for routine medical care. Patient today for annual exam. Her major complaint recent trouble with pedal edema. She had a lot of trouble last week but it is improved now.... It is worse when she is on her feet a long time. She follows with GYN in the. She has infrequent headaches he describes them as sharp lasting seconds. No other neurologic complaints. No numbness tingling weakness. No vision or hearing changes. Denies CP/palp/SOB/HA/congestion/fevers/GI or GU c/o. Taking meds as prescribed  Past Medical History  Diagnosis Date  . Hypertension   . Obesity   . Allergic rhinitis   . Hip pain, right 06/28/2012  . Apnea, sleep 06/28/2012    Previously used CPAP    Past Surgical History  Procedure Laterality Date  . Childbirth      Family History  Problem Relation Age of Onset  . Stroke Mother   . Liver disease Mother     hepatitis b  . Cancer Father     esophagus, prostate  . Liver disease Brother     hep c  . Mental illness Brother     PTSD from [place]  . Stroke Maternal Grandmother   . Cancer Paternal Grandfather     prostate    History   Social History  . Marital Status: Single    Spouse Name: N/A    Number of Children: N/A  . Years of Education: N/A   Occupational History  . HS music teacher- [employer]    Social History Main Topics  . Smoking status: Never Smoker   . Smokeless tobacco: Not on file  . Alcohol Use: Yes     Comment: social  . Drug Use: No  . 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 no dietary restrictions   Other Topics Concern  . Not on file   Social History Narrative  . No narrative on file     Current Outpatient Prescriptions on File Prior to Visit  Medication Sig Dispense Refill  . AZOR 10-40 MG per tablet TAKE 1 TABLET BY MOUTH EVERY DAY  90 tablet  0  . ranitidine (ZANTAC) 300 MG tablet Take 300 mg by mouth at bedtime as needed.      . benzonatate (TESSALON) 100 MG capsule Take 1 capsule (100 mg total) by mouth 2 (two) times daily as needed for cough.  20 capsule  0   No current facility-administered medications on file prior to visit.    Allergies  Allergen Reactions  . Sulfonamide Derivatives     Review of Systems  Review of Systems  Constitutional: Negative for fever and malaise/fatigue.  HENT: Negative for congestion.   Eyes: Negative for discharge.  Respiratory: Negative for shortness of breath.   Cardiovascular: Negative for chest pain, palpitations and leg swelling.  Gastrointestinal: Negative for nausea, abdominal pain and diarrhea.  Genitourinary: Negative for dysuria.  Musculoskeletal: Negative for falls.  Skin: Negative for rash.  Neurological: Negative for loss of consciousness and headaches.  Endo/Heme/Allergies: Negative for polydipsia.  Psychiatric/Behavioral: Negative for depression and suicidal ideas. The patient is not nervous/anxious and does  not have insomnia.     Objective  BP 120/84  Pulse 70  Temp(Src) 98.1 F (36.7 C) (Oral)  Ht 5\' 5"  (1.651 m)  Wt 253 lb 0.6 oz (114.778 kg)  BMI 42.11 kg/m2  SpO2 99%  Physical Exam  Physical Exam  Constitutional: She is oriented to person, place, and time and well-developed, well-nourished, and in no distress. No distress.  HENT:  Head: Normocephalic and atraumatic.  Eyes: Conjunctivae are normal.  Neck: Neck supple. No thyromegaly present.  Cardiovascular: Normal rate, regular rhythm and normal heart sounds.   No murmur heard. Pulmonary/Chest: Effort normal and breath sounds normal. She has no wheezes.  Abdominal: She exhibits no distension and no mass.  Musculoskeletal: She exhibits  no edema.  Lymphadenopathy:    She has no cervical adenopathy.  Neurological: She is alert and oriented to person, place, and time.  Skin: Skin is warm and dry. No rash noted. She is not diaphoretic.  Psychiatric: Memory, affect and judgment normal.    Lab Results  Component Value Date   TSH 2.639 06/22/2012   Lab Results  Component Value Date   WBC 8.1 06/22/2012   HGB 13.0 06/22/2012   HCT 39.4 06/22/2012   MCV 93.4 06/22/2012   PLT 364 06/22/2012   Lab Results  Component Value Date   CREATININE 0.87 06/22/2012   BUN 16 06/22/2012   NA 143 06/22/2012   K 4.4 06/22/2012   CL 108 06/22/2012   CO2 28 06/22/2012   Lab Results  Component Value Date   ALT 11 06/22/2012   AST 13 06/22/2012   ALKPHOS 83 06/22/2012   BILITOT 0.7 06/22/2012   Lab Results  Component Value Date   CHOL 148 06/22/2012   Lab Results  Component Value Date   HDL 54 06/22/2012   Lab Results  Component Value Date   LDLCALC 82 06/22/2012   Lab Results  Component Value Date   TRIG 59 06/22/2012   Lab Results  Component Value Date   CHOLHDL 2.7 06/22/2012     Assessment & Plan  HYPERTENSION Well controlled, no changes to meds. Encouraged heart healthy diet such as the DASH diet and exercise as tolerated.   GERD Avoid offending foods, start probiotics. Do not eat large meals in late evening and consider raising head of bed.   HYPERLIPIDEMIA Encouraged heart healthy diet, increase exercise, avoid trans fats, consider a krill oil cap daily  EDEMA Minimize sodium elevate feet above heart, consider compression hose and given lasix to use prn  OBESITY Encouraged DASH diet, decrease po intake and increase exercise as tolerated. Needs 7-8 hours of sleep nightly. Avoid trans fats, eat small, frequent meals every 4-5 hours with lean proteins, complex carbs and healthy fats. Minimize simple carbs, GMO foods.  Apnea, sleep Not using CPAP now but has significant snoring, resteless sleep, fatigue, weight gain  and HTN, agrees to referral to pulmonology for further consideration and likely repeat testing since patient reports greater than 5 years since last test.   Annual physical exam Patient encouraged to maintain heart healthy diet, regular exercise, adequate sleep. Consider daily probiotics. Take medications as prescribed

## 2013-12-09 NOTE — Assessment & Plan Note (Signed)
Encouraged DASH diet, decrease po intake and increase exercise as tolerated. Needs 7-8 hours of sleep nightly. Avoid trans fats, eat small, frequent meals every 4-5 hours with lean proteins, complex carbs and healthy fats. Minimize simple carbs, GMO foods. 

## 2013-12-09 NOTE — Patient Instructions (Addendum)
Zyrtec daily for allergies Nasal saline twice a day Flonase otc as needed Add a calcium/magnesium supplement daily for muscle cramps   Sleep Apnea  Sleep apnea is a sleep disorder characterized by abnormal pauses in breathing while you sleep. When your breathing pauses, the level of oxygen in your blood decreases. This causes you to move out of deep sleep and into light sleep. As a result, your quality of sleep is poor, and the system that carries your blood throughout your body (cardiovascular system) experiences stress. If sleep apnea remains untreated, the following conditions can develop:  High blood pressure (hypertension).  Coronary artery disease.  Inability to achieve or maintain an erection (impotence).  Impairment of your thought process (cognitive dysfunction). There are three types of sleep apnea: 1. Obstructive sleep apnea--Pauses in breathing during sleep because of a blocked airway. 2. Central sleep apnea--Pauses in breathing during sleep because the area of the brain that controls your breathing does not send the correct signals to the muscles that control breathing. 3. Mixed sleep apnea--A combination of both obstructive and central sleep apnea. RISK FACTORS The following risk factors can increase your risk of developing sleep apnea:  Being overweight.  Smoking.  Having narrow passages in your nose and throat.  Being of older age.  Being female.  Alcohol use.  Sedative and tranquilizer use.  Ethnicity. Among individuals younger than 35 years, African Americans are at increased risk of sleep apnea. SYMPTOMS   Difficulty staying asleep.  Daytime sleepiness and fatigue.  Loss of energy.  Irritability.  Loud, heavy snoring.  Morning headaches.  Trouble concentrating.  Forgetfulness.  Decreased interest in sex. DIAGNOSIS  In order to diagnose sleep apnea, your caregiver will perform a physical examination. Your caregiver may suggest that you take a  home sleep test. Your caregiver may also recommend that you spend the night in a sleep lab. In the sleep lab, several monitors record information about your heart, lungs, and brain while you sleep. Your leg and arm movements and blood oxygen level are also recorded. TREATMENT The following actions may help to resolve mild sleep apnea:  Sleeping on your side.   Using a decongestant if you have nasal congestion.   Avoiding the use of depressants, including alcohol, sedatives, and narcotics.   Losing weight and modifying your diet if you are overweight. There also are devices and treatments to help open your airway:  Oral appliances. These are custom-made mouthpieces that shift your lower jaw forward and slightly open your bite. This opens your airway.  Devices that create positive airway pressure. This positive pressure "splints" your airway open to help you breathe better during sleep. The following devices create positive airway pressure:  Continuous positive airway pressure (CPAP) device. The CPAP device creates a continuous level of air pressure with an air pump. The air is delivered to your airway through a mask while you sleep. This continuous pressure keeps your airway open.  Nasal expiratory positive airway pressure (EPAP) device. The EPAP device creates positive air pressure as you exhale. The device consists of single-use valves, which are inserted into each nostril and held in place by adhesive. The valves create very little resistance when you inhale but create much more resistance when you exhale. That increased resistance creates the positive airway pressure. This positive pressure while you exhale keeps your airway open, making it easier to breath when you inhale again.  Bilevel positive airway pressure (BPAP) device. The BPAP device is used mainly in patients with  central sleep apnea. This device is similar to the CPAP device because it also uses an air pump to deliver  continuous air pressure through a mask. However, with the BPAP machine, the pressure is set at two different levels. The pressure when you exhale is lower than the pressure when you inhale.  Surgery. Typically, surgery is only done if you cannot comply with less invasive treatments or if the less invasive treatments do not improve your condition. Surgery involves removing excess tissue in your airway to create a wider passage way. Document Released: 05/10/2002 Document Revised: 09/14/2012 Document Reviewed: 09/26/2011 [facility] Patient Information 2015 [place], [place]. This information is not intended to replace advice given to you by your health care provider. Make sure you discuss any questions you have with your health care provider.

## 2013-12-09 NOTE — Assessment & Plan Note (Signed)
Not using CPAP now but has significant snoring, resteless sleep, fatigue, weight gain and HTN, agrees to referral to pulmonology for further consideration and likely repeat testing since patient reports greater than 5 years since last test.

## 2013-12-09 NOTE — Assessment & Plan Note (Signed)
Patient encouraged to maintain heart healthy diet, regular exercise, adequate sleep. Consider daily probiotics. Take medications as prescribed 

## 2013-12-09 NOTE — Assessment & Plan Note (Signed)
Avoid offending foods, start probiotics. Do not eat large meals in late evening and consider raising head of bed.  

## 2013-12-10 ENCOUNTER — Telehealth: Payer: Self-pay | Admitting: Family Medicine

## 2013-12-10 LAB — VARICELLA ZOSTER ANTIBODY, IGG: Varicella IgG: 1947 Index — ABNORMAL HIGH (ref <135.00)

## 2013-12-10 NOTE — Telephone Encounter (Signed)
Relevant patient education assigned to patient using Emmi. ° °

## 2014-01-12 ENCOUNTER — Other Ambulatory Visit: Payer: Self-pay | Admitting: Family Medicine

## 2014-01-12 ENCOUNTER — Telehealth: Payer: Self-pay

## 2014-01-12 DIAGNOSIS — H9192 Unspecified hearing loss, left ear: Secondary | ICD-10-CM

## 2014-01-12 NOTE — Telephone Encounter (Signed)
If she would like to see a second ENT for a second opinion or an audiologist I am happy to place the referral

## 2014-01-12 NOTE — Telephone Encounter (Signed)
Pt would like referral for 2nd opinion with specialist you think best.

## 2014-01-12 NOTE — Telephone Encounter (Signed)
Pt stated that "she lost hearing in her left ear. She went to ENT in [place]. She wants to speak with Dr. Abner GreenspanBlyth for a second opinion."LDM

## 2014-01-20 LAB — HM MAMMOGRAPHY

## 2014-01-20 LAB — HM PAP SMEAR

## 2014-01-29 ENCOUNTER — Other Ambulatory Visit: Payer: Self-pay | Admitting: Family Medicine

## 2014-01-31 ENCOUNTER — Encounter: Payer: Self-pay | Admitting: Family Medicine

## 2014-02-10 ENCOUNTER — Institutional Professional Consult (permissible substitution): Payer: BC Managed Care – PPO | Admitting: Pulmonary Disease

## 2014-03-14 ENCOUNTER — Institutional Professional Consult (permissible substitution): Payer: BC Managed Care – PPO | Admitting: Pulmonary Disease

## 2014-03-17 ENCOUNTER — Encounter: Payer: Self-pay | Admitting: Internal Medicine

## 2014-04-27 ENCOUNTER — Institutional Professional Consult (permissible substitution): Payer: BC Managed Care – PPO | Admitting: Pulmonary Disease

## 2014-05-03 ENCOUNTER — Other Ambulatory Visit: Payer: Self-pay | Admitting: Family Medicine

## 2014-06-20 ENCOUNTER — Ambulatory Visit (INDEPENDENT_AMBULATORY_CARE_PROVIDER_SITE_OTHER): Payer: BC Managed Care – PPO | Admitting: Family Medicine

## 2014-06-20 ENCOUNTER — Institutional Professional Consult (permissible substitution): Payer: BC Managed Care – PPO | Admitting: Pulmonary Disease

## 2014-06-20 ENCOUNTER — Encounter: Payer: Self-pay | Admitting: Family Medicine

## 2014-06-20 VITALS — BP 136/83 | HR 68 | Temp 97.9°F | Ht 65.0 in | Wt 252.0 lb

## 2014-06-20 DIAGNOSIS — E669 Obesity, unspecified: Secondary | ICD-10-CM

## 2014-06-20 DIAGNOSIS — E782 Mixed hyperlipidemia: Secondary | ICD-10-CM

## 2014-06-20 DIAGNOSIS — K219 Gastro-esophageal reflux disease without esophagitis: Secondary | ICD-10-CM

## 2014-06-20 DIAGNOSIS — I10 Essential (primary) hypertension: Secondary | ICD-10-CM

## 2014-06-20 DIAGNOSIS — R609 Edema, unspecified: Secondary | ICD-10-CM

## 2014-06-20 MED ORDER — FUROSEMIDE 20 MG PO TABS: 20.0000 mg | ORAL_TABLET | Freq: Every day | ORAL | Status: DC | PRN

## 2014-06-20 MED ORDER — AMLODIPINE-OLMESARTAN 10-40 MG PO TABS: 1.0000 | ORAL_TABLET | Freq: Every day | ORAL | Status: DC

## 2014-06-20 MED ORDER — RANITIDINE HCL 300 MG PO TABS
300.0000 mg | ORAL_TABLET | Freq: Every evening | ORAL | Status: DC | PRN
Start: 2014-06-20 — End: 2016-03-10

## 2014-06-20 NOTE — Progress Notes (Signed)
Pre visit review using our clinic review tool, if applicable. No additional management support is needed unless otherwise documented below in the visit note. 

## 2014-06-20 NOTE — Progress Notes (Signed)
Terri [name]  [id number] 1951-03-05 06/20/2014      Progress Note-Follow Up  Subjective  Chief Complaint  Chief Complaint  Patient presents with  . Follow-up    6 mos    HPI  Patient is a 64 y.o. female in today for routine medical care. In today for follow up. No recent illness. 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 Is following with ENT, Dr Annalee GentaShoemaker. No recent acute concerns otherwise. Denies CP/palp/SOB/HA/congestion/fevers/GI or GU c/o. Taking meds as prescribed  Past Medical History  Diagnosis Date  . Hypertension   . Obesity   . Allergic rhinitis   . Hip pain, right 06/28/2012  . Apnea, sleep 06/28/2012    Previously used CPAP    Past Surgical History  Procedure Laterality Date  . Childbirth      Family History  Problem Relation Age of Onset  . Stroke Mother   . Liver disease Mother     hepatitis b  . Cancer Father     esophagus, prostate  . Liver disease Brother     hep c  . Mental illness Brother     PTSD from [place]  . Stroke Maternal Grandmother   . Cancer Paternal Grandfather     prostate    History   Social History  . Marital Status: Single    Spouse Name: N/A    Number of Children: N/A  . Years of Education: N/A   Occupational History  . 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 Smoking status: Never Smoker   . Smokeless tobacco: Not on file  . Alcohol Use: Yes     Comment: social  . Drug Use: No  . 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 no dietary restrictions   Other Topics Concern  . Not on file   Social History Narrative    Current Outpatient Prescriptions on File Prior to Visit  Medication Sig Dispense Refill  . AZOR 10-40 MG per tablet TAKE 1 TABLET BY MOUTH DAILY 90 tablet 0  . benzonatate (TESSALON) 100 MG capsule Take 1 capsule (100 mg total) by mouth 2 (two) times daily as needed for cough. 20 capsule  0  . furosemide (LASIX) 20 MG tablet Take 1 tablet (20 mg total) by mouth daily as needed for fluid or edema. Edema, sob, weight gain>3# 30 tablet 3  . omeprazole (PRILOSEC) 40 MG capsule Take 1 capsule (40 mg total) by mouth daily. 90 capsule 3  . ranitidine (ZANTAC) 300 MG tablet Take 300 mg by mouth at bedtime as needed.     No current facility-administered medications on file prior to visit.    Allergies  Allergen Reactions  . Sulfonamide Derivatives     Review of Systems  Review of Systems  Constitutional: Negative for fever and malaise/fatigue.  HENT: Positive for hearing loss. Negative for congestion.   Eyes: Negative for discharge.  Respiratory: Negative for shortness of breath.   Cardiovascular: Negative for chest pain, palpitations and leg swelling.  Gastrointestinal: Negative for nausea, abdominal pain and diarrhea.  Genitourinary: Negative for dysuria.  Musculoskeletal: Negative for falls.  Skin: Negative for rash.  Neurological: Negative for loss of consciousness and headaches.  Endo/Heme/Allergies: Negative for polydipsia.  Psychiatric/Behavioral: Negative for depression and suicidal ideas. The patient is not nervous/anxious and does not have insomnia.  Objective  BP 136/83 mmHg  Pulse 68  Temp(Src) 97.9 F (36.6 C) (Oral)  Ht  (1.651 m)  Wt 252 lb (114.306 kg)  BMI 41.93 kg/m2  SpO2 99%  Physical Exam  Physical Exam  Constitutional: She is oriented to person, place, and time and well-developed, well-nourished, and in no distress. No distress.  HENT:  Head: Normocephalic and atraumatic.  Eyes: Conjunctivae are normal.  Neck: Neck supple. No thyromegaly present.  Cardiovascular: Normal rate, regular rhythm and normal heart sounds.   No murmur heard. Pulmonary/Chest: Effort normal and breath sounds normal. She has no wheezes.  Abdominal: She exhibits no distension and no mass.  Musculoskeletal: She exhibits no edema.  Lymphadenopathy:    She  has no cervical adenopathy.  Neurological: She is alert and oriented to person, place, and time.  Skin: Skin is warm and dry. No rash noted. She is not diaphoretic.  Psychiatric: Memory, affect and judgment normal.    Lab Results  Component Value Date   TSH 1.832 12/09/2013   Lab Results  Component Value Date   WBC 7.6 12/09/2013   HGB 13.0 12/09/2013   HCT 37.7 12/09/2013   MCV 89.5 12/09/2013   PLT 373 12/09/2013   Lab Results  Component Value Date   CREATININE 0.85 12/09/2013   BUN 12 12/09/2013   NA 142 12/09/2013   K 3.9 12/09/2013   CL 107 12/09/2013   CO2 27 12/09/2013   Lab Results  Component Value Date   ALT 19 12/09/2013   AST 16 12/09/2013   ALKPHOS 79 12/09/2013   BILITOT 0.5 12/09/2013   Lab Results  Component Value Date   CHOL 151 12/09/2013   Lab Results  Component Value Date   HDL 52 12/09/2013   Lab Results  Component Value Date   LDLCALC 88 12/09/2013   Lab Results  Component Value Date   TRIG 54 12/09/2013   Lab Results  Component Value Date   CHOLHDL 2.9 12/09/2013     Assessment & Plan  Essential hypertension Well controlled, no changes to meds. Encouraged heart healthy diet such as the DASH diet and exercise as tolerated.    GERD Avoid offending foods, start probiotics. Do not eat large meals in late evening and consider raising head of bed.    Obesity Encouraged DASH diet, decrease po intake and increase exercise as tolerated. Needs 7-8 hours of sleep nightly. Avoid trans fats, eat small, frequent meals every 4-5 hours with lean proteins, complex carbs and healthy fats. Minimize simple carbs, GMO foods.   Hyperlipidemia, mixed Encouraged heart healthy diet, increase exercise, avoid trans fats, consider a krill oil cap daily

## 2014-06-20 NOTE — Patient Instructions (Addendum)
Rel of rec ENT Dr Annalee Gentashoemaker last 2 notes    Jobst stockings, light weight 10-20 mmhg on in am off in pm  Salon Pas gel or patches, Curcumen caps daily   Probiotics daily Digestive Advantage or Phillip's Colon Health   Food Choices for Gastroesophageal Reflux Disease When you have gastroesophageal reflux disease (GERD), the foods you eat and your eating habits are very important. Choosing the right foods can help ease the discomfort of GERD. WHAT GENERAL GUIDELINES DO I NEED TO FOLLOW?  Choose fruits, vegetables, whole grains, low-fat dairy products, and low-fat meat, fish, and poultry.  Limit fats such as oils, salad dressings, butter, nuts, and avocado.  Keep a food diary to identify foods that cause symptoms.  Avoid foods that cause reflux. These may be different for different people.  Eat frequent small meals instead of three large meals each day.  Eat your meals slowly, in a relaxed setting.  Limit fried foods.  Cook foods using methods other than frying.  Avoid drinking alcohol.  Avoid drinking large amounts of liquids with your meals.  Avoid bending over or lying down until 2-3 hours after eating. WHAT FOODS ARE NOT RECOMMENDED? The following are some foods and drinks that may worsen your symptoms: Vegetables Tomatoes. Tomato juice. Tomato and spaghetti sauce. Chili peppers. Onion and garlic. Horseradish. Fruits Oranges, grapefruit, and lemon (fruit and juice). Meats High-fat meats, fish, and poultry. This includes hot dogs, ribs, ham, sausage, salami, and bacon. Dairy Whole milk and chocolate milk. Sour cream. Cream. Butter. Ice cream. Cream cheese.  Beverages Coffee and tea, with or without caffeine. Carbonated beverages or energy drinks. Condiments Hot sauce. Barbecue sauce.  Sweets/Desserts Chocolate and cocoa. Donuts. Peppermint and spearmint. Fats and Oils High-fat foods, including [place] fries and potato chips. Other Vinegar. Strong spices, such as  black pepper, white pepper, red pepper, cayenne, curry powder, cloves, ginger, and chili powder. The items listed above may not be a complete list of foods and beverages to avoid. Contact your dietitian for more information. Document Released: 05/20/2005 Document Revised: 05/25/2013 Document Reviewed: 03/24/2013 [facility] Patient Information 2015 [employer] [place]. This information is not intended to replace advice given to you by your health care provider. Make sure you discuss any questions you have with your health care provider.

## 2014-06-26 NOTE — Assessment & Plan Note (Signed)
Encouraged heart healthy diet, increase exercise, avoid trans fats, consider a krill oil cap daily 

## 2014-06-26 NOTE — Assessment & Plan Note (Signed)
Well controlled, no changes to meds. Encouraged heart healthy diet such as the DASH diet and exercise as tolerated.  °

## 2014-06-26 NOTE — Assessment & Plan Note (Signed)
Avoid offending foods, start probiotics. Do not eat large meals in late evening and consider raising head of bed.  

## 2014-06-26 NOTE — Assessment & Plan Note (Signed)
Encouraged DASH diet, decrease po intake and increase exercise as tolerated. Needs 7-8 hours of sleep nightly. Avoid trans fats, eat small, frequent meals every 4-5 hours with lean proteins, complex carbs and healthy fats. Minimize simple carbs, GMO foods. 

## 2014-07-12 ENCOUNTER — Telehealth: Payer: Self-pay | Admitting: *Deleted

## 2014-07-12 NOTE — Telephone Encounter (Signed)
Prior authorization for omeprazole initiated. Awaiting determination. JG//CMA  

## 2014-08-31 NOTE — Telephone Encounter (Signed)
Patient calling in regarding this. She is wanting to know status. Best # [phone]

## 2014-08-31 NOTE — Telephone Encounter (Signed)
[employer]

## 2014-09-02 NOTE — Telephone Encounter (Signed)
I called [employer] and resubmitted pa info. They stated it would need to be sent over to the clinical dept for a determination and would fax us once it's received.

## 2014-09-05 ENCOUNTER — Institutional Professional Consult (permissible substitution): Payer: BC Managed Care – PPO | Admitting: Internal Medicine

## 2014-09-06 NOTE — Telephone Encounter (Signed)
PA approved effective 08/03/2014 through 09/02/2015. Approval letter sent for scanning. [id number]//[id number]

## 2014-10-05 ENCOUNTER — Telehealth: Payer: Self-pay | Admitting: Internal Medicine

## 2014-10-05 NOTE — Telephone Encounter (Signed)
Received records from [facility] sent to Dr. Jetty Duhamellinton Young 10/05/14 fbg.

## 2014-11-03 ENCOUNTER — Telehealth: Payer: Self-pay | Admitting: Internal Medicine

## 2014-11-03 NOTE — Telephone Encounter (Signed)
Received records from [facility] forwarded via fax to Dr. Thomos Lemonsobert Yoo Doehyun 11/03/14 fbg.

## 2014-11-15 ENCOUNTER — Institutional Professional Consult (permissible substitution): Payer: BC Managed Care – PPO | Admitting: Internal Medicine

## 2014-11-22 ENCOUNTER — Telehealth: Payer: Self-pay | Admitting: Family Medicine

## 2014-11-22 NOTE — Telephone Encounter (Signed)
pre visit letter mailed 11/22/14 °

## 2014-11-24 ENCOUNTER — Telehealth: Payer: Self-pay | Admitting: Family Medicine

## 2014-11-24 NOTE — Telephone Encounter (Signed)
Pre visit letter mailed 11/22/14 °

## 2014-12-13 ENCOUNTER — Encounter: Payer: BC Managed Care – PPO | Admitting: Family Medicine

## 2015-01-09 ENCOUNTER — Other Ambulatory Visit: Payer: Self-pay | Admitting: Family Medicine

## 2015-03-08 ENCOUNTER — Other Ambulatory Visit: Payer: Self-pay | Admitting: Family Medicine

## 2015-04-10 ENCOUNTER — Institutional Professional Consult (permissible substitution): Payer: BC Managed Care – PPO | Admitting: Internal Medicine

## 2015-05-08 ENCOUNTER — Other Ambulatory Visit: Payer: Self-pay | Admitting: Family Medicine

## 2015-06-19 ENCOUNTER — Encounter: Payer: Self-pay | Admitting: Family Medicine

## 2015-06-19 ENCOUNTER — Ambulatory Visit (INDEPENDENT_AMBULATORY_CARE_PROVIDER_SITE_OTHER): Payer: BC Managed Care – PPO | Admitting: Family Medicine

## 2015-06-19 VITALS — BP 118/78 | HR 77 | Temp 98.0°F | Ht 65.0 in | Wt 259.5 lb

## 2015-06-19 DIAGNOSIS — E782 Mixed hyperlipidemia: Secondary | ICD-10-CM

## 2015-06-19 DIAGNOSIS — G4730 Sleep apnea, unspecified: Secondary | ICD-10-CM

## 2015-06-19 DIAGNOSIS — F329 Major depressive disorder, single episode, unspecified: Secondary | ICD-10-CM

## 2015-06-19 DIAGNOSIS — I10 Essential (primary) hypertension: Secondary | ICD-10-CM | POA: Diagnosis not present

## 2015-06-19 DIAGNOSIS — Z1159 Encounter for screening for other viral diseases: Secondary | ICD-10-CM | POA: Diagnosis not present

## 2015-06-19 DIAGNOSIS — E669 Obesity, unspecified: Secondary | ICD-10-CM | POA: Diagnosis not present

## 2015-06-19 DIAGNOSIS — Z0000 Encounter for general adult medical examination without abnormal findings: Secondary | ICD-10-CM | POA: Diagnosis not present

## 2015-06-19 DIAGNOSIS — K219 Gastro-esophageal reflux disease without esophagitis: Secondary | ICD-10-CM | POA: Diagnosis not present

## 2015-06-19 DIAGNOSIS — F32A Depression, unspecified: Secondary | ICD-10-CM

## 2015-06-19 HISTORY — DX: Depression, unspecified: F32.A

## 2015-06-19 LAB — LIPID PANEL
CHOL/HDL RATIO: 3
CHOLESTEROL: 145 mg/dL (ref 0–200)
HDL: 50.6 mg/dL (ref >39.00)
LDL CALC: 84 mg/dL (ref 0–99)
NONHDL: 93.95
Triglycerides: 49 mg/dL (ref 0.0–149.0)
VLDL: 9.8 mg/dL (ref 0.0–40.0)

## 2015-06-19 LAB — COMPREHENSIVE METABOLIC PANEL
ALT: 12 U/L (ref 0–35)
AST: 14 U/L (ref 0–37)
Albumin: 3.8 g/dL (ref 3.5–5.2)
Alkaline Phosphatase: 82 U/L (ref 39–117)
BUN: 13 mg/dL (ref 6–23)
CHLORIDE: 108 meq/L (ref 96–112)
CO2: 26 mEq/L (ref 19–32)
Calcium: 9.2 mg/dL (ref 8.4–10.5)
Creatinine, Ser: 0.9 mg/dL (ref 0.40–1.20)
GFR: 81.41 mL/min (ref >60.00)
GLUCOSE: 99 mg/dL (ref 70–99)
POTASSIUM: 4.4 meq/L (ref 3.5–5.1)
SODIUM: 143 meq/L (ref 135–145)
Total Bilirubin: 0.6 mg/dL (ref 0.2–1.2)
Total Protein: 7.3 g/dL (ref 6.0–8.3)

## 2015-06-19 LAB — CBC
HCT: 40.4 % (ref 36.0–46.0)
HEMOGLOBIN: 13.1 g/dL (ref 12.0–15.0)
MCHC: 32.4 g/dL (ref 30.0–36.0)
MCV: 94.2 fl (ref 78.0–100.0)
PLATELETS: 317 10*3/uL (ref 150.0–400.0)
RBC: 4.29 Mil/uL (ref 3.87–5.11)
RDW: 14.6 % (ref 11.5–15.5)
WBC: 9 10*3/uL (ref 4.0–10.5)

## 2015-06-19 LAB — TSH: TSH: 2.85 u[IU]/mL (ref 0.35–4.50)

## 2015-06-19 MED ORDER — AMLODIPINE-OLMESARTAN 10-40 MG PO TABS: 1.0000 | ORAL_TABLET | Freq: Every day | ORAL | Status: DC

## 2015-06-19 NOTE — Assessment & Plan Note (Signed)
Encouraged heart healthy diet, increase exercise, avoid trans fats, consider a krill oil cap daily 

## 2015-06-19 NOTE — Assessment & Plan Note (Signed)
Patient encouraged to maintain heart healthy diet, regular exercise, adequate sleep. Consider daily probiotics. Take medications as prescribed 

## 2015-06-19 NOTE — Assessment & Plan Note (Signed)
Encouraged DASH diet, decrease po intake and increase exercise as tolerated. Needs 7-8 hours of sleep nightly. Avoid trans fats, eat small, frequent meals every 4-5 hours with lean proteins, complex carbs and healthy fats. Minimize simple carbs 

## 2015-06-19 NOTE — Patient Instructions (Addendum)
[facility] for Ad[facility], Female  A healthy lifestyle and preventive care can promote health and wellness. Preventive health guidelines for women include the following key practices.  A routine yearly physical is a good way to check with your health care provider about your health and preventive screening. It is a chance to share any concerns and updates on your health and to receive a thorough exam.  Visit your dentist for a routine exam and preventive care every 6 months. Brush your teeth twice a day and floss once a day. Good oral hygiene prevents tooth decay and gum disease.  The frequency of eye exams is based on your age, health, family medical history, use of contact lenses, and other factors. Follow your health care provider's recommendations for frequency of eye exams.  Eat a healthy diet. Foods like vegetables, fruits, whole grains, low-fat dairy products, and lean protein foods contain the nutrients you need without too many calories. Decrease your intake of foods high in solid fats, added sugars, and salt. Eat the right amount of calories for you.Get information about a proper diet from your health care provider, if necessary.  Regular physical exercise is one of the most important things you can do for your health. Most adults should get at least 150 minutes of moderate-intensity exercise (any activity that increases your heart rate and causes you to sweat) each week. In addition, most adults need muscle-strengthening exercises on 2 or more days a week.  Maintain a healthy weight. The body mass index (BMI) is a screening tool to identify possible weight problems. It provides an estimate of body fat based on height and weight. Your health care provider can find your BMI and can help you achieve or maintain a healthy weight.For adults 20 years and older:  A BMI below 18.5 is considered underweight.  A BMI of 18.5 to 24.9 is normal.  A BMI of 25 to 29.9 is considered  overweight.  A BMI of 30 and above is considered obese.  Maintain normal blood lipids and cholesterol levels by exercising and minimizing your intake of saturated fat. Eat a balanced diet with plenty of fruit and vegetables. Blood tests for lipids and cholesterol should begin at age 53 and be repeated every 5 years. If your lipid or cholesterol levels are high, you are over 50, or you are at high risk for heart disease, you may need your cholesterol levels checked more frequently.Ongoing high lipid and cholesterol levels should be treated with medicines if diet and exercise are not working.  If you smoke, find out from your health care provider how to quit. If you do not use tobacco, do not start.  Lung cancer screening is recommended for adults aged 69-80 years who are at high risk for developing lung cancer because of a history of smoking. A yearly low-dose CT scan of the lungs is recommended for people who have at least a 30-pack-year history of smoking and are a current smoker or have quit within the past 15 years. A pack year of smoking is smoking an average of 1 pack of cigarettes a day for 1 year (for example: 1 pack a day for 30 years or 2 packs a day for 15 years). Yearly screening should continue until the smoker has stopped smoking for at least 15 years. Yearly screening should be stopped for people who develop a health problem that would prevent them from having lung cancer treatment.  If you are pregnant, do not drink  alcohol. If you are breastfeeding, be very cautious about drinking alcohol. If you are not pregnant and choose to drink alcohol, do not have more than 1 drink per day. One drink is considered to be 12 ounces (355 mL) of beer, 5 ounces (148 mL) of wine, or 1.5 ounces (44 mL) of liquor.  Avoid use of street drugs. Do not share needles with anyone. Ask for help if you need support or instructions about stopping the use of drugs.  High blood pressure causes heart disease and  increases the risk of stroke. Your blood pressure should be checked at least every 1 to 2 years. Ongoing high blood pressure should be treated with medicines if weight loss and exercise do not work.  If you are 52-59 years old, ask your health care provider if you should take aspirin to prevent strokes.  Diabetes screening is done by taking a blood sample to check your blood glucose level after you have not eaten for a certain period of time (fasting). If you are not overweight and you do not have risk factors for diabetes, you should be screened once every 3 years starting at age 53. If you are overweight or obese and you are 77-47 years of age, you should be screened for diabetes every year as part of your cardiovascular risk assessment.  Breast cancer screening is essential preventive care for women. You should practice "breast self-awareness." This means understanding the normal appearance and feel of your breasts and may include breast self-examination. Any changes detected, no matter how small, should be reported to a health care provider. Women in their 63s and 30s should have a clinical breast exam (CBE) by a health care provider as part of a regular health exam every 1 to 3 years. After age 51, women should have a CBE every year. Starting at age 37, women should consider having a mammogram (breast X-ray test) every year. Women who have a family history of breast cancer should talk to their health care provider about genetic screening. Women at a high risk of breast cancer should talk to their health care providers about having an MRI and a mammogram every year.  Breast cancer gene (BRCA)-related cancer risk assessment is recommended for women who have family members with BRCA-related cancers. BRCA-related cancers include breast, ovarian, tubal, and peritoneal cancers. Having family members with these cancers may be associated with an increased risk for harmful changes (mutations) in the breast  cancer genes BRCA1 and BRCA2. Results of the assessment will determine the need for genetic counseling and BRCA1 and BRCA2 testing.  Your health care provider may recommend that you be screened regularly for cancer of the pelvic organs (ovaries, uterus, and vagina). This screening involves a pelvic examination, including checking for microscopic changes to the surface of your cervix (Pap test). You may be encouraged to have this screening done every 3 years, beginning at age 73.  For women ages 21-65, health care providers may recommend pelvic exams and Pap testing every 3 years, or they may recommend the Pap and pelvic exam, combined with testing for human papilloma virus (HPV), every 5 years. Some types of HPV increase your risk of cervical cancer. Testing for HPV may also be done on women of any age with unclear Pap test results.  Other health care providers may not recommend any screening for nonpregnant women who are considered low risk for pelvic cancer and who do not have symptoms. Ask your health care provider if a screening pelvic  exam is right for you.  If you have had past treatment for cervical cancer or a condition that could lead to cancer, you need Pap tests and screening for cancer for at least 20 years after your treatment. If Pap tests have been discontinued, your risk factors (such as having a new sexual partner) need to be reassessed to determine if screening should resume. Some women have medical problems that increase the chance of getting cervical cancer. In these cases, your health care provider may recommend more frequent screening and Pap tests.  Colorectal cancer can be detected and often prevented. Most routine colorectal cancer screening begins at the age of 83 years and continues through age 32 years. However, your health care provider may recommend screening at an earlier age if you have risk factors for colon cancer. On a yearly basis, your health care provider may provide  home test kits to check for hidden blood in the stool. Use of a small camera at the end of a tube, to directly examine the colon (sigmoidoscopy or colonoscopy), can detect the earliest forms of colorectal cancer. Talk to your health care provider about this at age 74, when routine screening begins. Direct exam of the colon should be repeated every 5-10 years through age 38 years, unless early forms of precancerous polyps or small growths are found.  People who are at an increased risk for hepatitis B should be screened for this virus. You are considered at high risk for hepatitis B if:  You were born in a country where hepatitis B occurs often. Talk with your health care provider about which countries are considered high risk.  Your parents were born in a high-risk country and you have not received a shot to protect against hepatitis B (hepatitis B vaccine).  You have HIV or AIDS.  You use needles to inject street drugs.  You live with, or have sex with, someone who has hepatitis B.  You get hemodialysis treatment.  You take certain medicines for conditions like cancer, organ transplantation, and autoimmune conditions.  Hepatitis C blood testing is recommended for all people born from 1 through 1965 and any individual with known risks for hepatitis C.  Practice safe sex. Use condoms and avoid high-risk sexual practices to reduce the spread of sexually transmitted infections (STIs). STIs include gonorrhea, chlamydia, syphilis, trichomonas, herpes, HPV, and human immunodeficiency virus (HIV). Herpes, HIV, and HPV are viral illnesses that have no cure. They can result in disability, cancer, and death.  You should be screened for sexually transmitted illnesses (STIs) including gonorrhea and chlamydia if:  You are sexually active and are younger than 24 years.  You are older than 24 years and your health care provider tells you that you are at risk for this type of infection.  Your sexual  activity has changed since you were last screened and you are at an increased risk for chlamydia or gonorrhea. Ask your health care provider if you are at risk.  If you are at risk of being infected with HIV, it is recommended that you take a prescription medicine daily to prevent HIV infection. This is called preexposure prophylaxis (PrEP). You are considered at risk if:  You are sexually active and do not regularly use condoms or know the HIV status of your partner(s).  You take drugs by injection.  You are sexually active with a partner who has HIV.  Talk with your health care provider about whether you are at high risk of being  infected with HIV. If you choose to begin PrEP, you should first be tested for HIV. You should then be tested every 3 months for as long as you are taking PrEP.  Osteoporosis is a disease in which the bones lose minerals and strength with aging. This can result in serious bone fractures or breaks. The risk of osteoporosis can be identified using a bone density scan. Women ages 54 years and over and women at risk for fractures or osteoporosis should discuss screening with their health care providers. Ask your health care provider whether you should take a calcium supplement or vitamin D to reduce the rate of osteoporosis.  Menopause can be associated with physical symptoms and risks. Hormone replacement therapy is available to decrease symptoms and risks. You should talk to your health care provider about whether hormone replacement therapy is right for you.  Use sunscreen. Apply sunscreen liberally and repeatedly throughout the day. You should seek shade when your shadow is shorter than you. Protect yourself by wearing long sleeves, pants, a wide-brimmed hat, and sunglasses year round, whenever you are outdoors.  Once a month, do a whole body skin exam, using a mirror to look at the skin on your back. Tell your health care provider of new moles, moles that have irregular  borders, moles that are larger than a pencil eraser, or moles that have changed in shape or color.  Stay current with required vaccines (immunizations).  Influenza vaccine. All adults should be immunized every year.  Tetanus, diphtheria, and acellular pertussis (Td, Tdap) vaccine. Pregnant women should receive 1 dose of Tdap vaccine during each pregnancy. The dose should be obtained regardless of the length of time since the last dose. Immunization is preferred during the 27th-36th week of gestation. An adult who has not previously received Tdap or who does not know her vaccine status should receive 1 dose of Tdap. This initial dose should be followed by tetanus and diphtheria toxoids (Td) booster doses every 10 years. Adults with an unknown or incomplete history of completing a 3-dose immunization series with Td-containing vaccines should begin or complete a primary immunization series including a Tdap dose. Adults should receive a Td booster every 10 years.  Varicella vaccine. An adult without evidence of immunity to varicella should receive 2 doses or a second dose if she has previously received 1 dose. Pregnant females who do not have evidence of immunity should receive the first dose after pregnancy. This first dose should be obtained before leaving the health care facility. The second dose should be obtained 4-8 weeks after the first dose.  Human papillomavirus (HPV) vaccine. Females aged 13-26 years who have not received the vaccine previously should obtain the 3-dose series. The vaccine is not recommended for use in pregnant females. However, pregnancy testing is not needed before receiving a dose. If a female is found to be pregnant after receiving a dose, no treatment is needed. In that case, the remaining doses should be delayed until after the pregnancy. Immunization is recommended for any person with an immunocompromised condition through the age of 9 years if she did not get any or all doses  earlier. During the 3-dose series, the second dose should be obtained 4-8 weeks after the first dose. The third dose should be obtained 24 weeks after the first dose and 16 weeks after the second dose.  Zoster vaccine. One dose is recommended for adults aged 91 years or older unless certain conditions are present.  Measles, mumps, and rubella (  MMR) vaccine. Adults born before 1957 generally are considered immune to measles and mumps. Adults born in 1957 or later should have 1 or more doses of MMR vaccine unless there is a contraindication to the vaccine or there is laboratory evidence of immunity to each of the three diseases. A routine second dose of MMR vaccine should be obtained at least 28 days after the first dose for students attending postsecondary schools, health care workers, or international travelers. People who received inactivated measles vaccine or an unknown type of measles vaccine during 1963-1967 should receive 2 doses of MMR vaccine. People who received inactivated mumps vaccine or an unknown type of mumps vaccine before 1979 and are at high risk for mumps infection should consider immunization with 2 doses of MMR vaccine. For females of childbearing age, rubella immunity should be determined. If there is no evidence of immunity, females who are not pregnant should be vaccinated. If there is no evidence of immunity, females who are pregnant should delay immunization until after pregnancy. Unvaccinated health care workers born before 1957 who lack laboratory evidence of measles, mumps, or rubella immunity or laboratory confirmation of disease should consider measles and mumps immunization with 2 doses of MMR vaccine or rubella immunization with 1 dose of MMR vaccine.  Pneumococcal 13-valent conjugate (PCV13) vaccine. When indicated, a person who is uncertain of his immunization history and has no record of immunization should receive the PCV13 vaccine. All adults 65 years of age and older  should receive this vaccine. An adult aged 19 years or older who has certain medical conditions and has not been previously immunized should receive 1 dose of PCV13 vaccine. This PCV13 should be followed with a dose of pneumococcal polysaccharide (PPSV23) vaccine. Adults who are at high risk for pneumococcal disease should obtain the PPSV23 vaccine at least 8 weeks after the dose of PCV13 vaccine. Adults older than 63 years of age who have normal immune system function should obtain the PPSV23 vaccine dose at least 1 year after the dose of PCV13 vaccine.  Pneumococcal polysaccharide (PPSV23) vaccine. When PCV13 is also indicated, PCV13 should be obtained first. All adults aged 65 years and older should be immunized. An adult younger than age 65 years who has certain medical conditions should be immunized. Any person who resides in a nursing home or long-term care facility should be immunized. An adult smoker should be immunized. People with an immunocompromised condition and certain other conditions should receive both PCV13 and PPSV23 vaccines. People with human immunodeficiency virus (HIV) infection should be immunized as soon as possible after diagnosis. Immunization during chemotherapy or radiation therapy should be avoided. Routine use of PPSV23 vaccine is not recommended for American Indians, [place] Natives, or people younger than 65 years unless there are medical conditions that require PPSV23 vaccine. When indicated, people who have unknown immunization and have no record of immunization should receive PPSV23 vaccine. One-time revaccination 5 years after the first dose of PPSV23 is recommended for people aged 19-64 years who have chronic kidney failure, nephrotic syndrome, asplenia, or immunocompromised conditions. People who received 1-2 doses of PPSV23 before age 65 years should receive another dose of PPSV23 vaccine at age 65 years or later if at least 5 years have passed since the previous dose. Doses  of PPSV23 are not needed for people immunized with PPSV23 at or after age 65 years.  Meningococcal vaccine. Adults with asplenia or persistent complement component deficiencies should receive 2 doses of quadrivalent meningococcal conjugate (MenACWY-D) vaccine. The   doses should be obtained at least 2 months apart. Microbiologists working with certain meningococcal bacteria, military recruits, people at risk during an outbreak, and people who travel to or live in countries with a high rate of meningitis should be immunized. A first-year college student up through age 21 years who is living in a residence hall should receive a dose if she did not receive a dose on or after her 16th birthday. Adults who have certain high-risk conditions should receive one or more doses of vaccine.  Hepatitis A vaccine. Adults who wish to be protected from this disease, have certain high-risk conditions, work with hepatitis A-infected animals, work in hepatitis A research labs, or travel to or work in countries with a high rate of hepatitis A should be immunized. Adults who were previously unvaccinated and who anticipate close contact with an international adoptee during the first 60 days after arrival in the [place] from a country with a high rate of hepatitis A should be immunized.  Hepatitis B vaccine. Adults who wish to be protected from this disease, have certain high-risk conditions, may be exposed to blood or other infectious body fluids, are household contacts or sex partners of hepatitis B positive people, are clients or workers in certain care facilities, or travel to or work in countries with a high rate of hepatitis B should be immunized.  Haemophilus influenzae type b (Hib) vaccine. A previously unvaccinated person with asplenia or sickle cell disease or having a scheduled splenectomy should receive 1 dose of Hib vaccine. Regardless of previous immunization, a recipient of a hematopoietic stem cell transplant  should receive a 3-dose series 6-12 months after her successful transplant. Hib vaccine is not recommended for adults with HIV infection. Preventive Services / Frequency Ages 19 to 39 years  Blood pressure check.** / Every 3-5 years.  Lipid and cholesterol check.** / Every 5 years beginning at age 20.  Clinical breast exam.** / Every 3 years for women in their 20s and 30s.  BRCA-related cancer risk assessment.** / For women who have family members with a BRCA-related cancer (breast, ovarian, tubal, or peritoneal cancers).  Pap test.** / Every 2 years from ages 21 through 29. Every 3 years starting at age 30 through age 65 or 70 with a history of 3 consecutive normal Pap tests.  HPV screening.** / Every 3 years from ages 30 through ages 65 to 70 with a history of 3 consecutive normal Pap tests.  Hepatitis C blood test.** / For any individual with known risks for hepatitis C.  Skin self-exam. / Monthly.  Influenza vaccine. / Every year.  Tetanus, diphtheria, and acellular pertussis (Tdap, Td) vaccine.** / Consult your health care provider. Pregnant women should receive 1 dose of Tdap vaccine during each pregnancy. 1 dose of Td every 10 years.  Varicella vaccine.** / Consult your health care provider. Pregnant females who do not have evidence of immunity should receive the first dose after pregnancy.  HPV vaccine. / 3 doses over 6 months, if 26 and younger. The vaccine is not recommended for use in pregnant females. However, pregnancy testing is not needed before receiving a dose.  Measles, mumps, rubella (MMR) vaccine.** / You need at least 1 dose of MMR if you were born in 1957 or later. You may also need a 2nd dose. For females of childbearing age, rubella immunity should be determined. If there is no evidence of immunity, females who are not pregnant should be vaccinated. If there is no evidence of   immunity, females who are pregnant should delay immunization until after  pregnancy.  Pneumococcal 13-valent conjugate (PCV13) vaccine.** / Consult your health care provider.  Pneumococcal polysaccharide (PPSV23) vaccine.** / 1 to 2 doses if you smoke cigarettes or if you have certain conditions.  Meningococcal vaccine.** / 1 dose if you are age 72 to 9 years and a Market researcher living in a residence hall, or have one of several medical conditions, you need to get vaccinated against meningococcal disease. You may also need additional booster doses.  Hepatitis A vaccine.** / Consult your health care provider.  Hepatitis B vaccine.** / Consult your health care provider.  Haemophilus influenzae type b (Hib) vaccine.** / Consult your health care provider. Ages 76 to 47 years  Blood pressure check.** / Every year.  Lipid and cholesterol check.** / Every 5 years beginning at age 2 years.  Lung cancer screening. / Every year if you are aged 53-80 years and have a 30-pack-year history of smoking and currently smoke or have quit within the past 15 years. Yearly screening is stopped once you have quit smoking for at least 15 years or develop a health problem that would prevent you from having lung cancer treatment.  Clinical breast exam.** / Every year after age 32 years.  BRCA-related cancer risk assessment.** / For women who have family members with a BRCA-related cancer (breast, ovarian, tubal, or peritoneal cancers).  Mammogram.** / Every year beginning at age 44 years and continuing for as long as you are in good health. Consult with your health care provider.  Pap test.** / Every 3 years starting at age 40 years through age 85 or 66 years with a history of 3 consecutive normal Pap tests.  HPV screening.** / Every 3 years from ages 36 years through ages 67 to 60 years with a history of 3 consecutive normal Pap tests.  Fecal occult blood test (FOBT) of stool. / Every year beginning at age 73 years and continuing until age 46 years. You may not need  to do this test if you get a colonoscopy every 10 years.  Flexible sigmoidoscopy or colonoscopy.** / Every 5 years for a flexible sigmoidoscopy or every 10 years for a colonoscopy beginning at age 14 years and continuing until age 64 years.  Hepatitis C blood test.** / For all people born from [birth year] through 1963 and any individual with known risks for hepatitis C.  Skin self-exam. / Monthly.  Influenza vaccine. / Every year.  Tetanus, diphtheria, and acellular pertussis (Tdap/Td) vaccine.** / Consult your health care provider. Pregnant women should receive 1 dose of Tdap vaccine during each pregnancy. 1 dose of Td every 10 years.  Varicella vaccine.** / Consult your health care provider. Pregnant females who do not have evidence of immunity should receive the first dose after pregnancy.  Zoster vaccine.** / 1 dose for adults aged 23 years or older.  Measles, mumps, rubella (MMR) vaccine.** / You need at least 1 dose of MMR if you were born in 1957 or later. You may also need a second dose. For females of childbearing age, rubella immunity should be determined. If there is no evidence of immunity, females who are not pregnant should be vaccinated. If there is no evidence of immunity, females who are pregnant should delay immunization until after pregnancy.  Pneumococcal 13-valent conjugate (PCV13) vaccine.** / Consult your health care provider.  Pneumococcal polysaccharide (PPSV23) vaccine.** / 1 to 2 doses if you smoke cigarettes or if you have certain conditions.  Meningococcal vaccine.** / Consult your health care provider.  Hepatitis A vaccine.** / Consult your health care provider.  Hepatitis B vaccine.** / Consult your health care provider.  Haemophilus influenzae type b (Hib) vaccine.** / Consult your health care provider. Ages 54 years and over  Blood pressure check.** / Every year.  Lipid and cholesterol check.** / Every 5 years beginning at age 87 years.  Lung cancer  screening. / Every year if you are aged 71-80 years and have a 30-pack-year history of smoking and currently smoke or have quit within the past 15 years. Yearly screening is stopped once you have quit smoking for at least 15 years or develop a health problem that would prevent you from having lung cancer treatment.  Clinical breast exam.** / Every year after age 68 years.  BRCA-related cancer risk assessment.** / For women who have family members with a BRCA-related cancer (breast, ovarian, tubal, or peritoneal cancers).  Mammogram.** / Every year beginning at age 93 years and continuing for as long as you are in good health. Consult with your health care provider.  Pap test.** / Every 3 years starting at age 76 years through age 45 or 58 years with 3 consecutive normal Pap tests. Testing can be stopped between 65 and 70 years with 3 consecutive normal Pap tests and no abnormal Pap or HPV tests in the past 10 years.  HPV screening.** / Every 3 years from ages 37 years through ages 35 or 59 years with a history of 3 consecutive normal Pap tests. Testing can be stopped between 65 and 70 years with 3 consecutive normal Pap tests and no abnormal Pap or HPV tests in the past 10 years.  Fecal occult blood test (FOBT) of stool. / Every year beginning at age 5 years and continuing until age 66 years. You may not need to do this test if you get a colonoscopy every 10 years.  Flexible sigmoidoscopy or colonoscopy.** / Every 5 years for a flexible sigmoidoscopy or every 10 years for a colonoscopy beginning at age 49 years and continuing until age 81 years.  Hepatitis C blood test.** / For all people born from [birth year] through 1963 and any individual with known risks for hepatitis C.  Osteoporosis screening.** / A one-time screening for women ages 28 years and over and women at risk for fractures or osteoporosis.  Skin self-exam. / Monthly.  Influenza vaccine. / Every year.  Tetanus, diphtheria, and  acellular pertussis (Tdap/Td) vaccine.** / 1 dose of Td every 10 years.  Varicella vaccine.** / Consult your health care provider.  Zoster vaccine.** / 1 dose for adults aged 36 years or older.  Pneumococcal 13-valent conjugate (PCV13) vaccine.** / Consult your health care provider.  Pneumococcal polysaccharide (PPSV23) vaccine.** / 1 dose for all adults aged 39 years and older.  Meningococcal vaccine.** / Consult your health care provider.  Hepatitis A vaccine.** / Consult your health care provider.  Hepatitis B vaccine.** / Consult your health care provider.  Haemophilus influenzae type b (Hib) vaccine.** / Consult your health care provider. ** Family history and personal history of risk and conditions may change your health care provider's recommendations.   This information is not intended to replace advice given to you by your health care provider. Make sure you discuss any questions you have with your health care provider.   Document Released: 07/16/2001 Document Revised: 06/10/2014 Document Reviewed: 10/15/2010 Elsevier Interactive Patient Education [employer].

## 2015-06-19 NOTE — Assessment & Plan Note (Signed)
Well controlled, no changes to meds. Encouraged heart healthy diet such as the DASH diet and exercise as tolerated.  °

## 2015-06-19 NOTE — Progress Notes (Signed)
Pre visit review using our clinic review tool, if applicable. No additional management support is needed unless otherwise documented below in the visit note. 

## 2015-06-19 NOTE — Assessment & Plan Note (Signed)
Patient continues to resist the repeat sleep study but is not using her old machine at this time. Encouraged to proceed with testing.

## 2015-06-19 NOTE — Progress Notes (Signed)
Subjective:    [name], female    DOB: November 19, 1950, 65 y.o.   MRN: [id number]  Chief Complaint  Patient presents with  . Annual Exam    HPI Patient is in today for annual exam. Has been under a great deal of stress secondary to her son's diagnosis of cancer. He has been undergoing chemotherapy for quite some time and she is his support system. She is managing fairly well but acknowledges moments of feeling overwhelmed. No suicidal ideation. Does note some recent tinnitus intermittently in left ear with no changes in hearing. No significant congestion or other new or acute complaints today. Denies CP/palp/SOB/HA/congestion/fevers/GI or GU c/o. Taking meds as prescribed  Past Medical History  Diagnosis Date  . Hypertension   . Obesity   . Allergic rhinitis   . Hip pain, right 06/28/2012  . Apnea, sleep 06/28/2012    Previously used CPAP  . Depression 06/19/2015  . Preventative health care 06/25/2015    Past Surgical History  Procedure Laterality Date  . Childbirth      Family History  Problem Relation Age of Onset  . Stroke Mother   . Liver disease Mother     hepatitis b  . Cancer Father     esophagus, prostate  . Liver disease Brother     hep c  . Mental illness Brother     PTSD from [place]  . Stroke Maternal Grandmother   . Cancer Paternal Grandfather     prostate    Social History   Social History  . Marital Status: Single    Spouse Name: N/A  . Number of Children: N/A  . Years of Education: N/A   Occupational History  . HS music teacher- [employer]    Social History Main Topics  . Smoking status: Never Smoker   . Smokeless tobacco: Not on file  . Alcohol Use: Yes     Comment: social  . Drug Use: No  . 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 no dietary restrictions   Other Topics Concern  . Not on file   Social History Narrative    Outpatient Prescriptions Prior to Visit  Medication Sig  Dispense Refill  . omeprazole (PRILOSEC) 40 MG capsule TAKE 1 CAPSULE (40 MG TOTAL) BY MOUTH DAILY. 90 capsule 0  . ranitidine (ZANTAC) 300 MG tablet Take 1 tablet (300 mg total) by mouth at bedtime as needed for heartburn. 30 tablet 5  . AZOR 10-40 MG tablet TAKE 1 TABLET BY MOUTH EVERY DAY 90 tablet 1  . furosemide (LASIX) 20 MG tablet Take 1 tablet (20 mg total) by mouth daily as needed for fluid or edema. Edema, sob, weight gain>3# (Patient not taking: Reported on 06/19/2015) 30 tablet 3  . benzonatate (TESSALON) 100 MG capsule Take 1 capsule (100 mg total) by mouth 2 (two) times daily as needed for cough. 20 capsule 0   No facility-administered medications prior to visit.    Allergies  Allergen Reactions  . Sulfonamide Derivatives     Review of Systems  Constitutional: Negative for fever, chills and malaise/fatigue.  HENT: Positive for tinnitus. Negative for congestion and hearing loss.   Eyes: Negative for discharge.  Respiratory: Negative for cough, sputum production and shortness of breath.   Cardiovascular: Negative for chest pain, palpitations and leg swelling.  Gastrointestinal: Negative for heartburn, nausea, vomiting, abdominal pain, diarrhea, constipation and blood in stool.  Genitourinary: Negative for  dysuria, urgency, frequency and hematuria.  Musculoskeletal: Negative for myalgias, back pain and falls.  Skin: Negative for rash.  Neurological: Positive for dizziness. Negative for sensory change, loss of consciousness, weakness and headaches.  Endo/Heme/Allergies: Negative for environmental allergies. Does not bruise/bleed easily.  Psychiatric/Behavioral: Negative for depression and suicidal ideas. The patient is nervous/anxious. The patient does not have insomnia.        Objective:    Physical Exam  Constitutional: She is oriented to person, place, and time. She appears well-developed and well-nourished. No distress.  HENT:  Head: Normocephalic and atraumatic.    Eyes: Conjunctivae are normal.  Neck: Neck supple. No thyromegaly present.  Cardiovascular: Normal rate, regular rhythm and normal heart sounds.   No murmur heard. Pulmonary/Chest: Effort normal and breath sounds normal. No respiratory distress.  Abdominal: Soft. Bowel sounds are normal. She exhibits no distension and no mass. There is no tenderness.  Musculoskeletal: She exhibits no edema.  Lymphadenopathy:    She has no cervical adenopathy.  Neurological: She is alert and oriented to person, place, and time.  Skin: Skin is warm and dry.  Psychiatric: She has a normal mood and affect. Her behavior is normal.    BP 118/78 mmHg  Pulse 77  Temp(Src) 98 F (36.7 C) (Oral)  Ht 5\' 5"  (1.651 m)  Wt 259 lb 8 oz (117.708 kg)  BMI 43.18 kg/m2  SpO2 96% Wt Readings from Last 3 Encounters:  06/19/15 259 lb 8 oz (117.708 kg)  06/20/14 252 lb (114.306 kg)  12/09/13 253 lb 0.6 oz (114.778 kg)     Lab Results  Component Value Date   WBC 9.0 06/19/2015   HGB 13.1 06/19/2015   HCT 40.4 06/19/2015   PLT 317.0 06/19/2015   GLUCOSE 99 06/19/2015   CHOL 145 06/19/2015   TRIG 49.0 06/19/2015   HDL 50.60 06/19/2015   LDLCALC 84 06/19/2015   ALT 12 06/19/2015   AST 14 06/19/2015   NA 143 06/19/2015   K 4.4 06/19/2015   CL 108 06/19/2015   CREATININE 0.90 06/19/2015   BUN 13 06/19/2015   CO2 26 06/19/2015   TSH 2.85 06/19/2015   HGBA1C 5.4 01/18/2009    Lab Results  Component Value Date   TSH 2.85 06/19/2015   Lab Results  Component Value Date   WBC 9.0 06/19/2015   HGB 13.1 06/19/2015   HCT 40.4 06/19/2015   MCV 94.2 06/19/2015   PLT 317.0 06/19/2015   Lab Results  Component Value Date   NA 143 06/19/2015   K 4.4 06/19/2015   CO2 26 06/19/2015   GLUCOSE 99 06/19/2015   BUN 13 06/19/2015   CREATININE 0.90 06/19/2015   BILITOT 0.6 06/19/2015   ALKPHOS 82 06/19/2015   AST 14 06/19/2015   ALT 12 06/19/2015   PROT 7.3 06/19/2015   ALBUMIN 3.8 06/19/2015   CALCIUM  9.2 06/19/2015   GFR 81.41 06/19/2015   Lab Results  Component Value Date   CHOL 145 06/19/2015   Lab Results  Component Value Date   HDL 50.60 06/19/2015   Lab Results  Component Value Date   LDLCALC 84 06/19/2015   Lab Results  Component Value Date   TRIG 49.0 06/19/2015   Lab Results  Component Value Date   CHOLHDL 3 06/19/2015   Lab Results  Component Value Date   HGBA1C 5.4 01/18/2009       Assessment & Plan:   Problem List Items Addressed This Visit    Apnea, sleep  Patient continues to resist the repeat sleep study but is not using her old machine at this time. Encouraged to proceed with testing.       Depression - Primary    Struggling with her only son's cancer diagnosis, declines medication but agrees to counseling. Consider meds in future if persitss      Essential hypertension    Well controlled, no changes to meds. Encouraged heart healthy diet such as the DASH diet and exercise as tolerated.       Relevant Medications   amLODipine-olmesartan (AZOR) 10-40 MG tablet   Other Relevant Orders   CBC (Completed)   TSH (Completed)   Lipid panel (Completed)   Comprehensive metabolic panel (Completed)   GERD    Avoid offending foods, start probiotics. Do not eat large meals in late evening and consider raising head of bed.       Hyperlipidemia, mixed    Encouraged heart healthy diet, increase exercise, avoid trans fats, consider a krill oil cap daily      Relevant Medications   amLODipine-olmesartan (AZOR) 10-40 MG tablet   Other Relevant Orders   Lipid panel (Completed)   Obesity    Encouraged DASH diet, decrease po intake and increase exercise as tolerated. Needs 7-8 hours of sleep nightly. Avoid trans fats, eat small, frequent meals every 4-5 hours with lean proteins, complex carbs and healthy fats. Minimize simple carbs      Preventative health care    Patient encouraged to maintain heart healthy diet, regular exercise, adequate sleep.  Consider daily probiotics. Take medications as prescribed. Labs reviewed       Other Visit Diagnoses    Need for hepatitis C screening test        Relevant Orders    Hepatitis C Antibody (Completed)       I have discontinued Ms. Dunbar's benzonatate. I have also changed her AZOR to amLODipine-olmesartan. Additionally, I am having her maintain her ranitidine, furosemide, and omeprazole.  Meds ordered this encounter  Medications  . amLODipine-olmesartan (AZOR) 10-40 MG tablet    Sig: Take 1 tablet by mouth daily.    Dispense:  90 tablet    Refill:  2    D/C PREVIOUS SCRIPTS FOR THIS MEDICATION    Reuel DerbyStacy Alexes Menchaca, MD

## 2015-06-19 NOTE — Assessment & Plan Note (Signed)
Avoid offending foods, start probiotics. Do not eat large meals in late evening and consider raising head of bed.  

## 2015-06-19 NOTE — Assessment & Plan Note (Signed)
Struggling with her only son's cancer diagnosis, declines medication but agrees to counseling. Consider meds in future if persitss

## 2015-06-20 LAB — HEPATITIS C ANTIBODY: HCV AB: NEGATIVE

## 2015-06-25 ENCOUNTER — Encounter: Payer: Self-pay | Admitting: Family Medicine

## 2015-06-25 DIAGNOSIS — Z0000 Encounter for general adult medical examination without abnormal findings: Secondary | ICD-10-CM

## 2015-06-25 DIAGNOSIS — Z1211 Encounter for screening for malignant neoplasm of colon: Secondary | ICD-10-CM | POA: Insufficient documentation

## 2015-06-25 HISTORY — DX: Encounter for general adult medical examination without abnormal findings: Z00.00

## 2015-06-25 NOTE — Assessment & Plan Note (Signed)
Patient encouraged to maintain heart healthy diet, regular exercise, adequate sleep. Consider daily probiotics. Take medications as prescribed. Labs reviewed 

## 2015-09-11 ENCOUNTER — Other Ambulatory Visit: Payer: Self-pay | Admitting: Family Medicine

## 2015-09-23 ENCOUNTER — Other Ambulatory Visit: Payer: Self-pay | Admitting: Family Medicine

## 2015-10-29 ENCOUNTER — Encounter: Payer: Self-pay | Admitting: Family Medicine

## 2015-11-23 ENCOUNTER — Encounter: Payer: Self-pay | Admitting: Family Medicine

## 2015-11-23 ENCOUNTER — Ambulatory Visit (INDEPENDENT_AMBULATORY_CARE_PROVIDER_SITE_OTHER): Payer: BC Managed Care – PPO | Admitting: Family Medicine

## 2015-11-23 DIAGNOSIS — E782 Mixed hyperlipidemia: Secondary | ICD-10-CM

## 2015-11-23 DIAGNOSIS — K219 Gastro-esophageal reflux disease without esophagitis: Secondary | ICD-10-CM | POA: Diagnosis not present

## 2015-11-23 DIAGNOSIS — M1990 Unspecified osteoarthritis, unspecified site: Secondary | ICD-10-CM

## 2015-11-23 DIAGNOSIS — I10 Essential (primary) hypertension: Secondary | ICD-10-CM | POA: Diagnosis not present

## 2015-11-23 DIAGNOSIS — G4730 Sleep apnea, unspecified: Secondary | ICD-10-CM

## 2015-11-23 MED ORDER — FUROSEMIDE 20 MG PO TABS: 20.0000 mg | ORAL_TABLET | Freq: Every day | ORAL | Status: DC | PRN

## 2015-11-23 NOTE — Assessment & Plan Note (Signed)
Avoid offending foods, start probiotics. Do not eat large meals in late evening and consider raising head of bed.  

## 2015-11-23 NOTE — Assessment & Plan Note (Signed)
Well controlled, no changes to meds. Encouraged heart healthy diet such as the DASH diet and exercise as tolerated.  °

## 2015-11-23 NOTE — Progress Notes (Signed)
Pre visit review using our clinic review tool, if applicable. No additional management support is needed unless otherwise documented below in the visit note. 

## 2015-11-23 NOTE — Assessment & Plan Note (Signed)
Not CPAP encouraged

## 2015-11-23 NOTE — Patient Instructions (Addendum)
NOW probiotic 10 cap 1 cap daily  DASH Eating Plan DASH stands for "Dietary Approaches to Stop Hypertension." The DASH eating plan is a healthy eating plan that has been shown to reduce high blood pressure (hypertension). Additional health benefits may include reducing the risk of type 2 diabetes mellitus, heart disease, and stroke. The DASH eating plan may also help with weight loss. WHAT DO I NEED TO KNOW ABOUT THE DASH EATING PLAN? For the DASH eating plan, you will follow these general guidelines:  Choose foods with a percent daily value for sodium of less than 5% (as listed on the food label).  Use salt-free seasonings or herbs instead of table salt or sea salt.  Check with your health care provider or pharmacist before using salt substitutes.  Eat lower-sodium products, often labeled as "lower sodium" or "no salt added."  Eat fresh foods.  Eat more vegetables, fruits, and low-fat dairy products.  Choose whole grains. Look for the word "whole" as the first word in the ingredient list.  Choose fish and skinless chicken or [place] more often than red meat. Limit fish, poultry, and meat to 6 oz (170 g) each day.  Limit sweets, desserts, sugars, and sugary drinks.  Choose heart-healthy fats.  Limit cheese to 1 oz (28 g) per day.  Eat more home-cooked food and less restaurant, buffet, and fast food.  Limit fried foods.  Cook foods using methods other than frying.  Limit canned vegetables. If you do use them, rinse them well to decrease the sodium.  When eating at a restaurant, ask that your food be prepared with less salt, or no salt if possible. WHAT FOODS CAN I EAT? Seek help from a dietitian for individual calorie needs. Grains Whole grain or whole wheat bread. Brown rice. Whole grain or whole wheat pasta. Quinoa, bulgur, and whole grain cereals. Low-sodium cereals. Corn or whole wheat flour tortillas. Whole grain cornbread. Whole grain crackers. Low-sodium  crackers. Vegetables Fresh or frozen vegetables (raw, steamed, roasted, or grilled). Low-sodium or reduced-sodium tomato and vegetable juices. Low-sodium or reduced-sodium tomato sauce and paste. Low-sodium or reduced-sodium canned vegetables.  Fruits All fresh, canned (in natural juice), or frozen fruits. Meat and Other Protein Products Ground beef (85% or leaner), grass-fed beef, or beef trimmed of fat. Skinless chicken or [place]. Ground chicken or [place]. Pork trimmed of fat. All fish and seafood. Eggs. Dried beans, peas, or lentils. Unsalted nuts and seeds. Unsalted canned beans. Dairy Low-fat dairy products, such as skim or 1% milk, 2% or reduced-fat cheeses, low-fat ricotta or cottage cheese, or plain low-fat yogurt. Low-sodium or reduced-sodium cheeses. Fats and Oils Tub margarines without trans fats. Light or reduced-fat mayonnaise and salad dressings (reduced sodium). Avocado. Safflower, olive, or canola oils. Natural peanut or almond butter. Other Unsalted popcorn and pretzels. The items listed above may not be a complete list of recommended foods or beverages. Contact your dietitian for more options. WHAT FOODS ARE NOT RECOMMENDED? Grains White bread. White pasta. White rice. Refined cornbread. Bagels and croissants. Crackers that contain trans fat. Vegetables Creamed or fried vegetables. Vegetables in a cheese sauce. Regular canned vegetables. Regular canned tomato sauce and paste. Regular tomato and vegetable juices. Fruits Dried fruits. Canned fruit in light or heavy syrup. Fruit juice. Meat and Other Protein Products Fatty cuts of meat. Ribs, chicken wings, bacon, sausage, bologna, salami, chitterlings, fatback, hot dogs, bratwurst, and packaged luncheon meats. Salted nuts and seeds. Canned beans with salt. Dairy Whole or 2% milk, cream, half-and-half,  and cream cheese. Whole-fat or sweetened yogurt. Full-fat cheeses or blue cheese. Nondairy creamers and whipped toppings.  Processed cheese, cheese spreads, or cheese curds. Condiments Onion and garlic salt, seasoned salt, table salt, and sea salt. Canned and packaged gravies. Worcestershire sauce. Tartar sauce. Barbecue sauce. Teriyaki sauce. Soy sauce, including reduced sodium. Steak sauce. Fish sauce. Oyster sauce. Cocktail sauce. Horseradish. Ketchup and mustard. Meat flavorings and tenderizers. Bouillon cubes. Hot sauce. Tabasco sauce. Marinades. Taco seasonings. Relishes. Fats and Oils Butter, stick margarine, lard, shortening, ghee, and bacon fat. Coconut, palm kernel, or palm oils. Regular salad dressings. Other Pickles and olives. Salted popcorn and pretzels. The items listed above may not be a complete list of foods and beverages to avoid. Contact your dietitian for more information. WHERE CAN I FIND MORE INFORMATION? National Heart, Lung, and Blood Institute: travelstabloid.com   This information is not intended to replace advice given to you by your health care provider. Make sure you discuss any questions you have with your health care provider.   Document Released: 05/09/2011 Document Revised: 06/10/2014 Document Reviewed: 03/24/2013 Elsevier Interactive Patient Education [employer].

## 2015-11-23 NOTE — Assessment & Plan Note (Addendum)
Encouraged DASH diet, decrease po intake and increase exercise as tolerated. Needs 7-8 hours of sleep nightly. Avoid trans fats, eat small, frequent meals every 4-5 hours with lean proteins, complex carbs and healthy fats. Minimize simple. Has lost 15 pounds since last visit when she did the Atkins diet at [employer]

## 2015-12-03 ENCOUNTER — Encounter: Payer: Self-pay | Admitting: Family Medicine

## 2015-12-03 NOTE — Assessment & Plan Note (Signed)
Encouraged heart healthy diet, increase exercise, avoid trans fats, consider a krill oil cap daily 

## 2015-12-03 NOTE — Assessment & Plan Note (Signed)
Describes stiffness upon standing, encouraged to stay active. Eat a clean diet and attempt modest weight down

## 2015-12-03 NOTE — Progress Notes (Signed)
[name], female   DOB: November 08, 1950, 65 y.o.   MRN: [id number]   Subjective:    Patient ID: Terri Pacasamela E Velarde, female    DOB: November 08, 1950, 65 y.o.   MRN: [id number]  Chief Complaint  Patient presents with  . Follow-up    HPI Patient is in today for follow up. Is noting increased joint stiffness. Most notable after prolonged immobility, sitting. No injury or fall. Has been struggling with fatigue and is trying the Atkins diet again. Denies CP/palp/SOB/HA/congestion/fevers/GI or GU c/o. Taking meds as prescribed  Past Medical History  Diagnosis Date  . Hypertension   . Obesity   . Allergic rhinitis   . Hip pain, right 06/28/2012  . Apnea, sleep 06/28/2012    Previously used CPAP  . Depression 06/19/2015  . Preventative health care 06/25/2015  . Morbid obesity (HCC) 04/11/2008    Qualifier: Diagnosis of  By: Nena JordanYoo DO, D. Robert   . Osteoarthritis 01/16/2010    Qualifier: Diagnosis of  By: Nena JordanYoo DO, D. Robert     Past Surgical History  Procedure Laterality Date  . Childbirth      Family History  Problem Relation Age of Onset  . Stroke Mother   . Liver disease Mother     hepatitis b  . Cancer Father     esophagus, prostate  . Liver disease Brother     hep c  . Mental illness Brother     PTSD from [place]  . Stroke Maternal Grandmother   . Cancer Paternal Grandfather     prostate    Social History   Social History  . Marital Status: Single    Spouse Name: N/A  . Number of Children: N/A  . Years of Education: N/A   Occupational History  . 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 Smoking status: Never Smoker   . Smokeless tobacco: Not on file  . Alcohol Use: Yes     Comment: social  . Drug Use: No  . 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 no dietary restrictions   Other Topics Concern  . Not on file   Social History Narrative    Outpatient Prescriptions Prior to Visit  Medication Sig  Dispense Refill  . amLODipine-olmesartan (AZOR) 10-40 MG tablet Take 1 tablet by mouth daily. 90 tablet 2  . amLODipine-olmesartan (AZOR) 10-40 MG tablet TAKE 1 TABLET BY MOUTH EVERY DAY 90 tablet 1  . omeprazole (PRILOSEC) 40 MG capsule *PA PENDING TAKE 1 CAPSULE (40 MG TOTAL) BY MOUTH DAILY. 90 capsule 0  . ranitidine (ZANTAC) 300 MG tablet Take 1 tablet (300 mg total) by mouth at bedtime as needed for heartburn. 30 tablet 5  . furosemide (LASIX) 20 MG tablet Take 1 tablet (20 mg total) by mouth daily as needed for fluid or edema. Edema, sob, weight gain>3# 30 tablet 3   No facility-administered medications prior to visit.    Allergies  Allergen Reactions  . Sulfonamide Derivatives     Review of Systems  Constitutional: Positive for malaise/fatigue. Negative for fever.  HENT: Negative for congestion.   Eyes: Negative for blurred vision.  Respiratory: Negative for shortness of breath.   Cardiovascular: Negative for chest pain, palpitations and leg swelling.  Gastrointestinal: Negative for nausea, abdominal pain and blood in stool.  Genitourinary: Negative for dysuria and frequency.  Musculoskeletal: Positive for back pain and joint pain. Negative for falls.  Skin: Negative for rash.  Neurological: Negative for dizziness, loss of consciousness and headaches.  Endo/Heme/Allergies: Negative for environmental allergies.  Psychiatric/Behavioral: Negative for depression. The patient is not nervous/anxious.        Objective:    Physical Exam  Constitutional: She is oriented to person, place, and time. She appears well-developed and well-nourished. No distress.  HENT:  Head: Normocephalic and atraumatic.  Nose: Nose normal.  Eyes: Right eye exhibits no discharge. Left eye exhibits no discharge.  Neck: Normal range of motion. Neck supple.  Cardiovascular: Normal rate and regular rhythm.   No murmur heard. Pulmonary/Chest: Effort normal and breath sounds normal.  Abdominal: Soft.  Bowel sounds are normal. There is no tenderness.  Musculoskeletal: She exhibits no edema.  Neurological: She is alert and oriented to person, place, and time.  Skin: Skin is warm and dry.  Psychiatric: She has a normal mood and affect.  Nursing note and vitals reviewed.   BP 122/82 mmHg  Pulse 75  Temp(Src) 98.3 F (36.8 C) (Oral)  Ht  (1.651 m)  Wt 245 lb 2 oz (111.188 kg)  BMI 40.79 kg/m2  SpO2 97% Wt Readings from Last 3 Encounters:  11/23/15 245 lb 2 oz (111.188 kg)  06/19/15 259 lb 8 oz (117.708 kg)  06/20/14 252 lb (114.306 kg)     Lab Results  Component Value Date   WBC 9.0 06/19/2015   HGB 13.1 06/19/2015   HCT 40.4 06/19/2015   PLT 317.0 06/19/2015   GLUCOSE 99 06/19/2015   CHOL 145 06/19/2015   TRIG 49.0 06/19/2015   HDL 50.60 06/19/2015   LDLCALC 84 06/19/2015   ALT 12 06/19/2015   AST 14 06/19/2015   NA 143 06/19/2015   K 4.4 06/19/2015   CL 108 06/19/2015   CREATININE 0.90 06/19/2015   BUN 13 06/19/2015   CO2 26 06/19/2015   TSH 2.85 06/19/2015   HGBA1C 5.4 01/18/2009    Lab Results  Component Value Date   TSH 2.85 06/19/2015   Lab Results  Component Value Date   WBC 9.0 06/19/2015   HGB 13.1 06/19/2015   HCT 40.4 06/19/2015   MCV 94.2 06/19/2015   PLT 317.0 06/19/2015   Lab Results  Component Value Date   NA 143 06/19/2015   K 4.4 06/19/2015   CO2 26 06/19/2015   GLUCOSE 99 06/19/2015   BUN 13 06/19/2015   CREATININE 0.90 06/19/2015   BILITOT 0.6 06/19/2015   ALKPHOS 82 06/19/2015   AST 14 06/19/2015   ALT 12 06/19/2015   PROT 7.3 06/19/2015   ALBUMIN 3.8 06/19/2015   CALCIUM 9.2 06/19/2015   GFR 81.41 06/19/2015   Lab Results  Component Value Date   CHOL 145 06/19/2015   Lab Results  Component Value Date   HDL 50.60 06/19/2015   Lab Results  Component Value Date   LDLCALC 84 06/19/2015   Lab Results  Component Value Date   TRIG 49.0 06/19/2015   Lab Results  Component Value Date   CHOLHDL 3 06/19/2015    Lab Results  Component Value Date   HGBA1C 5.4 01/18/2009       Assessment & Plan:   Problem List Items Addressed This Visit    Hyperlipidemia, mixed    Encouraged heart healthy diet, increase exercise, avoid trans fats, consider a krill oil cap daily      Relevant Medications   furosemide (LASIX) 20 MG tablet   Morbid obesity (HCC) - Primary    Encouraged  DASH diet, decrease po intake and increase exercise as tolerated. Needs 7-8 hours of sleep nightly. Avoid trans fats, eat small, frequent meals every 4-5 hours with lean proteins, complex carbs and healthy fats. Minimize simple. Has lost 15 pounds since last visit when [place] the [place]      Essential hypertension    Well controlled, no changes to meds. Encouraged heart healthy diet such as the DASH diet and exercise as tolerated.       Relevant Medications   furosemide (LASIX) 20 MG tablet   GERD    Avoid offending foods, start probiotics. Do not eat large meals in late evening and consider raising head of bed.       Osteoarthritis    Describes stiffness upon standing, encouraged to stay active. Eat a clean diet and attempt modest weight down      Apnea, sleep    Not CPAP encouraged          I am having Ms. Lisette GrinderCarlson maintain her ranitidine, amLODipine-olmesartan, omeprazole, amLODipine-olmesartan, cetirizine, and furosemide.  Meds ordered this encounter  Medications  . cetirizine (ZYRTEC) 10 MG tablet    Sig: Take 10 mg by mouth daily.    Refill:  5  . furosemide (LASIX) 20 MG tablet    Sig: Take 1 tablet (20 mg total) by mouth daily as needed for fluid or edema. Edema, sob, weight gain>3#    Dispense:  30 tablet    Refill:  3     Danise EdgeBLYTH, Henretter Piekarski, MD

## 2015-12-11 ENCOUNTER — Other Ambulatory Visit: Payer: Self-pay | Admitting: Family Medicine

## 2015-12-11 MED ORDER — OMEPRAZOLE 40 MG PO CPDR: DELAYED_RELEASE_CAPSULE | ORAL | Status: DC

## 2016-01-20 ENCOUNTER — Other Ambulatory Visit: Payer: Self-pay | Admitting: Family Medicine

## 2016-02-26 LAB — HM PAP SMEAR: HM Pap smear: NEGATIVE

## 2016-02-29 LAB — HM MAMMOGRAPHY

## 2016-03-07 ENCOUNTER — Encounter: Payer: Self-pay | Admitting: Family Medicine

## 2016-03-09 ENCOUNTER — Other Ambulatory Visit: Payer: Self-pay | Admitting: Family Medicine

## 2016-03-10 ENCOUNTER — Other Ambulatory Visit: Payer: Self-pay | Admitting: Family Medicine

## 2016-03-10 DIAGNOSIS — K219 Gastro-esophageal reflux disease without esophagitis: Secondary | ICD-10-CM

## 2016-04-08 ENCOUNTER — Other Ambulatory Visit: Payer: Self-pay | Admitting: Family Medicine

## 2016-04-29 ENCOUNTER — Ambulatory Visit: Payer: BC Managed Care – PPO | Admitting: Family Medicine

## 2016-05-06 ENCOUNTER — Encounter: Payer: Self-pay | Admitting: Family Medicine

## 2016-05-06 ENCOUNTER — Ambulatory Visit (INDEPENDENT_AMBULATORY_CARE_PROVIDER_SITE_OTHER): Payer: BC Managed Care – PPO | Admitting: Family Medicine

## 2016-05-06 VITALS — BP 118/68 | HR 61 | Temp 97.9°F | Wt 253.4 lb

## 2016-05-06 DIAGNOSIS — I10 Essential (primary) hypertension: Secondary | ICD-10-CM

## 2016-05-06 DIAGNOSIS — M25551 Pain in right hip: Secondary | ICD-10-CM | POA: Diagnosis not present

## 2016-05-06 DIAGNOSIS — R200 Anesthesia of skin: Secondary | ICD-10-CM | POA: Diagnosis not present

## 2016-05-06 DIAGNOSIS — E782 Mixed hyperlipidemia: Secondary | ICD-10-CM

## 2016-05-06 DIAGNOSIS — R202 Paresthesia of skin: Secondary | ICD-10-CM

## 2016-05-06 DIAGNOSIS — K219 Gastro-esophageal reflux disease without esophagitis: Secondary | ICD-10-CM

## 2016-05-06 HISTORY — DX: Anesthesia of skin: R20.0

## 2016-05-06 NOTE — Assessment & Plan Note (Addendum)
Persistently painful with increased use, agrees to sports med consult, does not generally take any meds. Larey SeatFell 3 years ago and was told she had some mild arthritis back then

## 2016-05-06 NOTE — Assessment & Plan Note (Signed)
Encouraged heart healthy diet, increase exercise, avoid trans fats, consider a krill oil cap daily 

## 2016-05-06 NOTE — Assessment & Plan Note (Signed)
Well controlled, no changes to meds. Encouraged heart healthy diet such as the DASH diet and exercise as tolerated.  °

## 2016-05-06 NOTE — Assessment & Plan Note (Addendum)
Avoid offending foods, start probiotics. Do not eat large meals in late evening and consider raising head of bed. Doing Omeprazole and Rantidine daily.

## 2016-05-06 NOTE — Assessment & Plan Note (Signed)
Only noted in am and clears within minutes of getting up. Proceed with low back xray today and encouraged to wear compression hose when on her feet.

## 2016-05-06 NOTE — Progress Notes (Signed)
[name], female   DOB: [birth year], 65 y.o.   MRN: [id number]   Subjective:    [name], female    DOB: 1950-07-29, 65 y.o.   MRN: [id number]  Chief Complaint  Patient presents with  . Follow-up    HPI Patient is in today for follow up patient c/o right hip pain with increased use. She denies any recent trauma or injury. No incontinence but right hip pain occurs daily only when lying down. No recent hospitalization or hospitalizations. .Denies CP/palp/SOB/HA/congestion/fevers/GI c/o. Taking meds as prescribed. Does acknowledge some urinary urgency.  Past Medical History:  Diagnosis Date  . Allergic rhinitis   . Apnea, sleep 06/28/2012   Previously used CPAP  . Depression 06/19/2015  . Hip pain, right 06/28/2012  . Hypertension   . Morbid obesity (HCC) 04/11/2008   Qualifier: Diagnosis of  By: [name]   . Numbness in feet 05/06/2016  . Obesity   . Osteoarthritis 01/16/2010   Qualifier: Diagnosis of  By: [name]   . Preventative health care 06/25/2015    Past Surgical History:  Procedure Laterality Date  . childbirth      Family History  Problem Relation Age of Onset  . Stroke Mother   . Liver disease Mother     hepatitis b  . Cancer Father     esophagus, prostate  . Liver disease Brother     hep c  . Mental illness Brother     PTSD from [place]  . Stroke Maternal Grandmother   . Cancer Paternal Grandfather     prostate    Social History   Social History  . Marital status: Single    Spouse name: N/A  . Number of children: N/A  . Years of education: N/A   Occupational History  . HS music teacher- [employer]    Social History Main Topics  . Smoking status: Never Smoker  . Smokeless tobacco: Not on file  . Alcohol use Yes     Comment: social  . Drug use: No  . 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 Not on file    Social History Narrative  . No narrative on file    Outpatient Medications Prior to Visit  Medication Sig Dispense Refill  . amLODipine-olmesartan (AZOR) 10-40 MG tablet Take 1 tablet by mouth daily. 90 tablet 2  . cetirizine (ZYRTEC) 10 MG tablet Take 10 mg by mouth daily.  5  . furosemide (LASIX) 20 MG tablet TAKE 1 TABLET (20 MG TOTAL) BY MOUTH DAILY AS NEEDED FOR FLUID OR EDEMA. EDEMA, SOB, WEIGHT GAIN>3# 30 tablet 3  . omeprazole (PRILOSEC) 40 MG capsule TAKE ONE CAPSULE BY MOUTH EVERY DAY 90 capsule 0  . ranitidine (ZANTAC) 300 MG tablet TAKE 1 TABLET (300 MG TOTAL) BY MOUTH AT BEDTIME AS NEEDED FOR HEARTBURN. 30 tablet 3  . amLODipine-olmesartan (AZOR) 10-40 MG tablet TAKE 1 TABLET BY MOUTH EVERY DAY 90 tablet 1   No facility-administered medications prior to visit.     Allergies  Allergen Reactions  . Sulfonamide Derivatives     Review of Systems  Constitutional: Negative for fever.  Eyes: Negative for blurred vision.  Respiratory: Negative for cough and shortness of breath.   Cardiovascular: Negative for chest pain and palpitations.  Gastrointestinal: Negative for vomiting.  Musculoskeletal: Positive for joint pain.  Negative for back pain.  Skin: Negative for rash.  Neurological: Negative for loss of consciousness and headaches.       Objective:    Physical Exam  Constitutional: She is oriented to person, place, and time. She appears well-developed and well-nourished. No distress.  HENT:  Head: Normocephalic and atraumatic.  Eyes: Conjunctivae are normal.  Neck: Normal range of motion. No thyromegaly present.  Cardiovascular: Normal rate and regular rhythm.   Pulmonary/Chest: Effort normal and breath sounds normal. She has no wheezes.  Abdominal: Soft. Bowel sounds are normal. There is no tenderness.  Musculoskeletal: Normal range of motion. She exhibits no edema or deformity.  Neurological: She is alert and oriented to person, place, and time.  Skin: Skin  is warm and dry. She is not diaphoretic.  Psychiatric: She has a normal mood and affect.    BP 118/68 (BP Location: Left Arm, Patient Position: Sitting, Cuff Size: Normal)   Pulse 61   Temp 97.9 F (36.6 C) (Oral)   Wt 253 lb 6.4 oz (114.9 kg)   SpO2 97%   BMI 42.17 kg/m  Wt Readings from Last 3 Encounters:  05/06/16 253 lb 6.4 oz (114.9 kg)  11/23/15 245 lb 2 oz (111.2 kg)  06/19/15 259 lb 8 oz (117.7 kg)     Lab Results  Component Value Date   WBC 8.9 05/06/2016   HGB 13.2 05/06/2016   HCT 39.8 05/06/2016   PLT 308.0 05/06/2016   GLUCOSE 84 05/06/2016   CHOL 145 06/19/2015   TRIG 49.0 06/19/2015   HDL 50.60 06/19/2015   LDLCALC 84 06/19/2015   ALT 13 05/06/2016   AST 16 05/06/2016   NA 143 05/06/2016   K 4.4 05/06/2016   CL 106 05/06/2016   CREATININE 0.94 05/06/2016   BUN 9 05/06/2016   CO2 29 05/06/2016   TSH 1.45 05/06/2016   HGBA1C 5.4 01/18/2009    Lab Results  Component Value Date   TSH 1.45 05/06/2016   Lab Results  Component Value Date   WBC 8.9 05/06/2016   HGB 13.2 05/06/2016   HCT 39.8 05/06/2016   MCV 94.8 05/06/2016   PLT 308.0 05/06/2016   Lab Results  Component Value Date   NA 143 05/06/2016   K 4.4 05/06/2016   CO2 29 05/06/2016   GLUCOSE 84 05/06/2016   BUN 9 05/06/2016   CREATININE 0.94 05/06/2016   BILITOT 0.7 05/06/2016   ALKPHOS 79 05/06/2016   AST 16 05/06/2016   ALT 13 05/06/2016   PROT 7.3 05/06/2016   ALBUMIN 3.9 05/06/2016   CALCIUM 9.4 05/06/2016   GFR 77.21 05/06/2016   Lab Results  Component Value Date   CHOL 145 06/19/2015   Lab Results  Component Value Date   HDL 50.60 06/19/2015   Lab Results  Component Value Date   LDLCALC 84 06/19/2015   Lab Results  Component Value Date   TRIG 49.0 06/19/2015   Lab Results  Component Value Date   CHOLHDL 3 06/19/2015   Lab Results  Component Value Date   HGBA1C 5.4 01/18/2009   I acted as a Neurosurgeonscribe for Dr. Abner GreenspanBlyth. [name], RMA     Assessment & Plan:    Problem List Items Addressed This Visit    Hyperlipidemia, mixed    Encouraged heart healthy diet, increase exercise, avoid trans fats, consider a krill oil cap daily      Morbid obesity (HCC)    Encouraged DASH diet, decrease po intake and increase exercise as  tolerated. Needs 7-8 hours of sleep nightly. Avoid trans fats, eat small, frequent meals every 4-5 hours with lean proteins, complex carbs and healthy fats. Minimize simple carbs, GMO foods.      Essential hypertension - Primary    Well controlled, no changes to meds. Encouraged heart healthy diet such as the DASH diet and exercise as tolerated.       Relevant Orders   TSH (Completed)   CBC (Completed)   Comprehensive metabolic panel (Completed)   GERD    Avoid offending foods, start probiotics. Do not eat large meals in late evening and consider raising head of bed. Doing Omeprazole and Rantidine daily.      Hip pain, right    Persistently painful with increased use, agrees to sports med consult, does not generally take any meds. Larey SeatFell 3 years ago and was told she had some mild arthritis back then      Relevant Orders   Ambulatory referral to Sports Medicine   DG Lumbar Spine 2-3 Views   Numbness in feet    Only noted in am and clears within minutes of getting up. Proceed with low back xray today and encouraged to wear compression hose when on her feet.        Other Visit Diagnoses    Numbness and tingling of both feet       Relevant Orders   DG Lumbar Spine 2-3 Views    CMA functioned as scribe during this visit. Interview, physical exam and plan performed by me. Documentation reviewed and attested to.   I am having Ms. Lisette GrinderCarlson maintain her amLODipine-olmesartan, cetirizine, furosemide, omeprazole, ranitidine, and oxybutynin.  Meds ordered this encounter  Medications  . oxybutynin (DITROPAN-XL) 10 MG 24 hr tablet    Sig: Take 1 tablet by mouth daily.    Danise EdgeBLYTH, STACEY, MD

## 2016-05-06 NOTE — Patient Instructions (Addendum)
NOW probiotic for daily use  Try compression hose knee highs, light weight 10-20 mmhg on in am off in pm. [place] [employer] [place] Hip Pain Your hip is the joint between your upper legs and your lower pelvis. The bones, cartilage, tendons, and muscles of your hip joint perform a lot of work each day supporting your body weight and allowing you to move around. Hip pain can range from a minor ache to severe pain in one or both of your hips. Pain may be felt on the inside of the hip joint near the groin, or the outside near the buttocks and upper thigh. You may have swelling or stiffness as well. Follow these instructions at home:  Take medicines only as directed by your health care provider.  Apply ice to the injured area:  Put ice in a plastic bag.  Place a towel between your skin and the bag.  Leave the ice on for 15-20 minutes at a time, 3-4 times a day.  Keep your leg raised (elevated) when possible to lessen swelling.  Avoid activities that cause pain.  Follow specific exercises as directed by your health care provider.  Sleep with a pillow between your legs on your most comfortable side.  Record how often you have hip pain, the location of the pain, and what it feels like. Contact a health care provider if:  You are unable to put weight on your leg.  Your hip is red or swollen or very tender to touch.  Your pain or swelling continues or worsens after 1 week.  You have increasing difficulty walking.  You have a fever. Get help right away if:  You have fallen.  You have a sudden increase in pain and swelling in your hip. This information is not intended to replace advice given to you by your health care provider. Make sure you discuss any questions you have with your health care provider. Document Released: 11/07/2009 Document Revised: 10/26/2015 Document Reviewed: 01/14/2013 Elsevier Interactive Patient Education  2017 [employer]

## 2016-05-06 NOTE — Progress Notes (Signed)
Pre visit review using our clinic review tool, if applicable. No additional management support is needed unless otherwise documented below in the visit note. 

## 2016-05-06 NOTE — Assessment & Plan Note (Signed)
Encouraged DASH diet, decrease po intake and increase exercise as tolerated. Needs 7-8 hours of sleep nightly. Avoid trans fats, eat small, frequent meals every 4-5 hours with lean proteins, complex carbs and healthy fats. Minimize simple carbs, GMO foods. 

## 2016-05-07 LAB — COMPREHENSIVE METABOLIC PANEL
ALK PHOS: 79 U/L (ref 39–117)
ALT: 13 U/L (ref 0–35)
AST: 16 U/L (ref 0–37)
Albumin: 3.9 g/dL (ref 3.5–5.2)
BILIRUBIN TOTAL: 0.7 mg/dL (ref 0.2–1.2)
BUN: 9 mg/dL (ref 6–23)
CALCIUM: 9.4 mg/dL (ref 8.4–10.5)
CO2: 29 mEq/L (ref 19–32)
Chloride: 106 mEq/L (ref 96–112)
Creatinine, Ser: 0.94 mg/dL (ref 0.40–1.20)
GFR: 77.21 mL/min (ref >60.00)
Glucose, Bld: 84 mg/dL (ref 70–99)
Potassium: 4.4 mEq/L (ref 3.5–5.1)
Sodium: 143 mEq/L (ref 135–145)
TOTAL PROTEIN: 7.3 g/dL (ref 6.0–8.3)

## 2016-05-07 LAB — CBC
HCT: 39.8 % (ref 36.0–46.0)
HEMOGLOBIN: 13.2 g/dL (ref 12.0–15.0)
MCHC: 33.2 g/dL (ref 30.0–36.0)
MCV: 94.8 fl (ref 78.0–100.0)
PLATELETS: 308 10*3/uL (ref 150.0–400.0)
RBC: 4.21 Mil/uL (ref 3.87–5.11)
RDW: 14.5 % (ref 11.5–15.5)
WBC: 8.9 10*3/uL (ref 4.0–10.5)

## 2016-05-07 LAB — TSH: TSH: 1.45 u[IU]/mL (ref 0.35–4.50)

## 2016-05-20 ENCOUNTER — Encounter: Payer: Self-pay | Admitting: Family Medicine

## 2016-05-20 ENCOUNTER — Ambulatory Visit (INDEPENDENT_AMBULATORY_CARE_PROVIDER_SITE_OTHER): Payer: BC Managed Care – PPO | Admitting: Family Medicine

## 2016-05-20 DIAGNOSIS — M25522 Pain in left elbow: Secondary | ICD-10-CM

## 2016-05-20 DIAGNOSIS — M25551 Pain in right hip: Secondary | ICD-10-CM | POA: Diagnosis not present

## 2016-05-20 NOTE — Patient Instructions (Addendum)
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 Avoid painful activities as much as possible. Heat over area of pain 3-4 times a day for 15 minutes at a time Start physical therapy and home exercises. Hip side raise exercise 3 sets of 10-15 once a day - add weights if this becomes too easy. Stretches - pick 2 and hold for 20-30 seconds x 3 - do once or twice a day. Aleve 2 tabs twice a day with food OR ibuprofen 600mg  three times a day with food for pain and inflammation. Cortisone injections, MRI are options if not improving as expected. Your elbow pain is due to arthritis - start therapy for this also. Follow up with me in 6 weeks.

## 2016-05-22 DIAGNOSIS — M25522 Pain in left elbow: Secondary | ICD-10-CM | POA: Insufficient documentation

## 2016-05-22 DIAGNOSIS — M25521 Pain in right elbow: Secondary | ICD-10-CM | POA: Insufficient documentation

## 2016-05-22 NOTE — Assessment & Plan Note (Signed)
2/2 combination of IT band syndrome and SI joint dysfunction.  Shown home exercises to start - will also do physical therapy.  Aleve or ibuprofen if needed.  Consider cortisone injections, MRI lumbar spine for referred pain if not improving.

## 2016-05-22 NOTE — Addendum Note (Signed)
Addended by: Kathi SimpersWISE, Mishel Sans F on: 05/22/2016 03:38 PM   Modules accepted: Orders

## 2016-05-22 NOTE — Progress Notes (Signed)
PCP and consultation requested by: Danise EdgeBLYTH, STACEY, MD  Subjective:   HPI: Patient is a 65 y.o. female here for right hip pain, left elbow pain.  Patient reports she has had 4 months of lateral right hip pain. No known injury or trauma. 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 Has had to use cane before. Pain level 1/10 but up to 8/10 at worst, sharp. Difficulty lying on right side. Has not had any treatment. Can feel numb here. Also with 2 weeks of anterior left elbow pain - had fracture here in 2009. Pain can be up to 8/10 at worst and can limit her motion. No swelling. No skin changes.  Past Medical History:  Diagnosis Date  . Allergic rhinitis   . Apnea, sleep 06/28/2012   Previously used CPAP  . Depression 06/19/2015  . Hip pain, right 06/28/2012  . Hypertension   . Morbid obesity (HCC) 04/11/2008   Qualifier: Diagnosis of  By: Nena JordanYoo DO, D. Robert   . Numbness in feet 05/06/2016  . Obesity   . Osteoarthritis 01/16/2010   Qualifier: Diagnosis of  By: Nena JordanYoo DO, D. Robert   . Preventative health care 06/25/2015    Current Outpatient Prescriptions on File Prior to Visit  Medication Sig Dispense Refill  . amLODipine-olmesartan (AZOR) 10-40 MG tablet Take 1 tablet by mouth daily. 90 tablet 2  . cetirizine (ZYRTEC) 10 MG tablet Take 10 mg by mouth daily.  5  . furosemide (LASIX) 20 MG tablet TAKE 1 TABLET (20 MG TOTAL) BY MOUTH DAILY AS NEEDED FOR FLUID OR EDEMA. EDEMA, SOB, WEIGHT GAIN>3# 30 tablet 3  . omeprazole (PRILOSEC) 40 MG capsule TAKE ONE CAPSULE BY MOUTH EVERY DAY 90 capsule 0  . oxybutynin (DITROPAN-XL) 10 MG 24 hr tablet Take 1 tablet by mouth daily.    . ranitidine (ZANTAC) 300 MG tablet TAKE 1 TABLET (300 MG TOTAL) BY MOUTH AT BEDTIME AS NEEDED FOR HEARTBURN. 30 tablet 3   No current facility-administered medications on file prior to visit.     Past Surgical History:  Procedure Laterality Date  . childbirth      Allergies  Allergen  Reactions  . Sulfonamide Derivatives     Social History   Social History  . Marital status: Single    Spouse name: N/A  . Number of children: N/A  . Years of education: N/A   Occupational History  . 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 Smoking status: Never Smoker  . Smokeless tobacco: Never Used  . Alcohol use Yes     Comment: social  . Drug use: No  . 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 Not on file   Social History Narrative  . No narrative on file    Family History  Problem Relation Age of Onset  . Stroke Mother   . Liver disease Mother     hepatitis b  . Cancer Father     esophagus, prostate  . Liver disease Brother     hep c  . Mental illness Brother     PTSD from [place]  . Stroke Maternal Grandmother   . Cancer Paternal Grandfather     prostate    BP 123/78   Pulse 73   Ht 5\' 5"  (1.651 m)   Wt 250 lb (113.4  kg)   BMI 41.60 kg/m   Review of Systems: See HPI above.     Objective:  Physical Exam:  Gen: NAD, comfortable in exam room  Back/right hip: No gross deformity, scoliosis. TTP greater trochanter, proximal IT band and SI joint.  No midline or bony TTP. FROM. Strength LEs 5/5 all muscle groups.   2+ MSRs in patellar and achilles tendons, equal bilaterally. Negative SLRs. Sensation intact to light touch bilaterally. Negative logroll bilateral hips Positive fabers right, negative left.  Negative piriformis.  Left elbow: No gross deformity, swelling, bruising. No tenderness to palpation. FROM with 5/5 strength. Collateral ligaments intact. NVI distally.   Assessment & Plan:  1. 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  Shown home exercises to start - will also do physical therapy.  Aleve or ibuprofen if needed.  Consider cortisone injections, MRI lumbar spine for referred pain if  not improving.  2. Left elbow pain - exam reassuring.  Suspect arthritis with prior fracture almost 10 years ago.  Start therapy for this as well.  F/u in 6 weeks.

## 2016-05-22 NOTE — Assessment & Plan Note (Signed)
exam reassuring.  Suspect arthritis with prior fracture almost 10 years ago.  Start therapy for this as well.  F/u in 6 weeks.

## 2016-06-24 ENCOUNTER — Ambulatory Visit: Payer: BC Managed Care – PPO | Admitting: Family Medicine

## 2016-06-26 ENCOUNTER — Other Ambulatory Visit: Payer: Self-pay | Admitting: Family Medicine

## 2016-07-15 ENCOUNTER — Ambulatory Visit: Payer: BC Managed Care – PPO | Admitting: Family Medicine

## 2016-08-05 ENCOUNTER — Ambulatory Visit (INDEPENDENT_AMBULATORY_CARE_PROVIDER_SITE_OTHER): Payer: BC Managed Care – PPO | Admitting: Family Medicine

## 2016-08-05 ENCOUNTER — Encounter: Payer: Self-pay | Admitting: Family Medicine

## 2016-08-05 VITALS — BP 122/70 | HR 74 | Temp 97.4°F | Wt 261.4 lb

## 2016-08-05 DIAGNOSIS — R609 Edema, unspecified: Secondary | ICD-10-CM | POA: Diagnosis not present

## 2016-08-05 DIAGNOSIS — H547 Unspecified visual loss: Secondary | ICD-10-CM | POA: Diagnosis not present

## 2016-08-05 DIAGNOSIS — K219 Gastro-esophageal reflux disease without esophagitis: Secondary | ICD-10-CM

## 2016-08-05 DIAGNOSIS — E782 Mixed hyperlipidemia: Secondary | ICD-10-CM | POA: Diagnosis not present

## 2016-08-05 DIAGNOSIS — Z0000 Encounter for general adult medical examination without abnormal findings: Secondary | ICD-10-CM

## 2016-08-05 DIAGNOSIS — I10 Essential (primary) hypertension: Secondary | ICD-10-CM

## 2016-08-05 HISTORY — DX: Unspecified visual loss: H54.7

## 2016-08-05 NOTE — Assessment & Plan Note (Signed)
Encouraged compression hose, minimize sodium and elevate feet.

## 2016-08-05 NOTE — Assessment & Plan Note (Signed)
Avoid offending foods, start probiotics. Do not eat large meals in late evening and consider raising head of bed.  

## 2016-08-05 NOTE — Assessment & Plan Note (Signed)
Encouraged DASH diet, decrease po intake and increase exercise as tolerated. Needs 7-8 hours of sleep nightly. Avoid trans fats, eat small, frequent meals every 4-5 hours with lean proteins, complex carbs and healthy fats. Minimize simple carbs, given paperwork on bariatric program

## 2016-08-05 NOTE — Progress Notes (Signed)
Pre visit review using our clinic review tool, if applicable. No additional management support is needed unless otherwise documented below in the visit note. 

## 2016-08-05 NOTE — Assessment & Plan Note (Signed)
Patient encouraged to maintain heart healthy diet, regular exercise, adequate sleep. Consider daily probiotics. Take medications as prescribed. Given and reviewed copy of ACP documents from  Secretary of State and encouraged to complete and return 

## 2016-08-05 NOTE — Assessment & Plan Note (Signed)
Well controlled, no changes to meds. Encouraged heart healthy diet such as the DASH diet and exercise as tolerated.  °

## 2016-08-05 NOTE — Assessment & Plan Note (Signed)
Referred to opthamology for further consideration 

## 2016-08-05 NOTE — Progress Notes (Signed)
[name], female   DOB: 03/14/1951, 66 y.o.   MRN: [id number]   Subjective:    [name], female    DOB: 03/14/1951, 66 y.o.   MRN: [id number]  Chief Complaint  Patient presents with  . Annual Exam  . Hyperlipidemia  . Hypertension  . Gastroesophageal Reflux    Hyperlipidemia  This is a chronic problem. The problem is controlled. Pertinent negatives include no chest pain or shortness of breath. Risk factors for coronary artery disease include hypertension and obesity.  Hypertension  This is a chronic problem. The problem is controlled. Pertinent negatives include no blurred vision, chest pain, headaches, malaise/fatigue, palpitations or shortness of breath.  Gastroesophageal Reflux  She reports no chest pain or no coughing. This is a chronic problem. The problem occurs occasionally.    Patient is in today for an annual examination. Patient has a Hx of HTN, GERD, hyperlipidemia. Patient has no acute concerns noted at this time. She feels well today. No recent febrile illness or recent hospitalizations. Denies CP/palp/SOB/HA/congestion/fevers/GI or GU c/o. Taking meds as prescribed.  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 Podiatry was not very helpful for his pain.   Patient Care Team: Bradd CanaryStacey A Gale Hulse, MD as PCP - General (Family Medicine)   Past Medical History:  Diagnosis Date  . Allergic rhinitis   . Apnea, sleep 06/28/2012   Previously used CPAP  . Decreased visual acuity 08/05/2016  . Depression 06/19/2015  . Hip pain, right 06/28/2012  . Hypertension   . Morbid obesity (HCC) 04/11/2008   Qualifier: Diagnosis of  By: Nena JordanYoo DO, D. Robert   . Numbness in feet 05/06/2016  . Obesity   . Osteoarthritis 01/16/2010   Qualifier: Diagnosis of  By: Nena JordanYoo DO, D. Robert   . Preventative health care 06/25/2015    Past Surgical History:  Procedure Laterality Date  . childbirth      Family History    Problem Relation Age of Onset  . Stroke Mother   . Liver disease Mother     hepatitis b  . Cancer Father     esophagus, prostate  . Liver disease Brother     hep c  . Mental illness Brother     PTSD from [place]  . Stroke Maternal Grandmother   . Cancer Paternal Grandfather     prostate    Social History   Social History  . Marital status: Single    Spouse name: N/A  . Number of children: N/A  . Years of education: N/A   Occupational History  . 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 Smoking status: Never Smoker  . Smokeless tobacco: Never Used  . Alcohol use Yes     Comment: social  . Drug use: No  . 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 Not on file   Social History Narrative  . No narrative on file    Outpatient Medications Prior to Visit  Medication Sig Dispense Refill  . amLODipine-olmesartan (AZOR) 10-40 MG tablet Take 1 tablet by mouth daily. 90 tablet 2  . cetirizine (ZYRTEC) 10 MG tablet Take 10 mg by mouth daily.  5  . furosemide (LASIX) 20 MG tablet TAKE 1 TABLET (20 MG TOTAL) BY MOUTH DAILY AS NEEDED FOR  FLUID OR EDEMA. EDEMA, SOB, WEIGHT GAIN>3# 30 tablet 3  . omeprazole (PRILOSEC) 40 MG capsule TAKE ONE CAPSULE BY MOUTH EVERY DAY 90 capsule 0  . oxybutynin (DITROPAN-XL) 10 MG 24 hr tablet Take 1 tablet by mouth daily.    . ranitidine (ZANTAC) 300 MG tablet TAKE 1 TABLET (300 MG TOTAL) BY MOUTH AT BEDTIME AS NEEDED FOR HEARTBURN. 30 tablet 3   No facility-administered medications prior to visit.     Allergies  Allergen Reactions  . Sulfonamide Derivatives     Review of Systems  Constitutional: Negative for fever and malaise/fatigue.  HENT: Negative for congestion.   Eyes: Negative for blurred vision.  Respiratory: Negative for cough and shortness of breath.   Cardiovascular: Positive for leg swelling. Negative for chest pain and  palpitations.  Gastrointestinal: Negative for vomiting.  Musculoskeletal: Negative for back pain.  Skin: Negative for rash.  Neurological: Negative for loss of consciousness and headaches.       Objective:    Physical Exam  Constitutional: She is oriented to person, place, and time. She appears well-developed and well-nourished. No distress.  HENT:  Head: Normocephalic and atraumatic.  Eyes: Conjunctivae are normal.  Neck: Normal range of motion. No thyromegaly present.  Cardiovascular: Normal rate and regular rhythm.   Pulmonary/Chest: Effort normal and breath sounds normal. She has no wheezes.  Abdominal: Soft. Bowel sounds are normal. There is no tenderness.  Musculoskeletal: She exhibits no edema or deformity.  Neurological: She is alert and oriented to person, place, and time.  Skin: Skin is warm and dry. She is not diaphoretic.  Psychiatric: She has a normal mood and affect.    BP 122/70 (BP Location: Left Wrist, Patient Position: Sitting, Cuff Size: Large)   Pulse 74   Temp 97.4 F (36.3 C) (Oral)   Wt 261 lb 6.4 oz (118.6 kg)   SpO2 97% Comment: RA  BMI 43.50 kg/m  Wt Readings from Last 3 Encounters:  08/05/16 261 lb 6.4 oz (118.6 kg)  05/20/16 250 lb (113.4 kg)  05/06/16 253 lb 6.4 oz (114.9 kg)     Lab Results  Component Value Date   WBC 8.9 05/06/2016   HGB 13.2 05/06/2016   HCT 39.8 05/06/2016   PLT 308.0 05/06/2016   GLUCOSE 84 05/06/2016   CHOL 145 06/19/2015   TRIG 49.0 06/19/2015   HDL 50.60 06/19/2015   LDLCALC 84 06/19/2015   ALT 13 05/06/2016   AST 16 05/06/2016   NA 143 05/06/2016   K 4.4 05/06/2016   CL 106 05/06/2016   CREATININE 0.94 05/06/2016   BUN 9 05/06/2016   CO2 29 05/06/2016   TSH 1.45 05/06/2016   HGBA1C 5.4 01/18/2009    Lab Results  Component Value Date   TSH 1.45 05/06/2016   Lab Results  Component Value Date   WBC 8.9 05/06/2016   HGB 13.2 05/06/2016   HCT 39.8 05/06/2016   MCV 94.8 05/06/2016   PLT 308.0  05/06/2016   Lab Results  Component Value Date   NA 143 05/06/2016   K 4.4 05/06/2016   CO2 29 05/06/2016   GLUCOSE 84 05/06/2016   BUN 9 05/06/2016   CREATININE 0.94 05/06/2016   BILITOT 0.7 05/06/2016   ALKPHOS 79 05/06/2016   AST 16 05/06/2016   ALT 13 05/06/2016   PROT 7.3 05/06/2016   ALBUMIN 3.9 05/06/2016   CALCIUM 9.4 05/06/2016   GFR 77.21 05/06/2016   Lab Results  Component Value Date   CHOL  145 06/19/2015   Lab Results  Component Value Date   HDL 50.60 06/19/2015   Lab Results  Component Value Date   LDLCALC 84 06/19/2015   Lab Results  Component Value Date   TRIG 49.0 06/19/2015   Lab Results  Component Value Date   CHOLHDL 3 06/19/2015   Lab Results  Component Value Date   HGBA1C 5.4 01/18/2009       Assessment & Plan:   Problem List Items Addressed This Visit    Hyperlipidemia, mixed    Encouraged heart healthy diet, increase exercise, avoid trans fats, consider a krill oil cap daily      Relevant Orders   Lipid panel   Morbid obesity (HCC)    Encouraged DASH diet, decrease po intake and increase exercise as tolerated. Needs 7-8 hours of sleep nightly. Avoid trans fats, eat small, frequent meals every 4-5 hours with lean proteins, complex carbs and healthy fats. Minimize simple carbs, given paperwork on bariatric program      Essential hypertension    Well controlled, no changes to meds. Encouraged heart healthy diet such as the DASH diet and exercise as tolerated.       Relevant Orders   CBC   Comprehensive metabolic panel   TSH   GERD    Avoid offending foods, start probiotics. Do not eat large meals in late evening and consider raising head of bed.        Preventative health care    Patient encouraged to maintain heart healthy diet, regular exercise, adequate sleep. Consider daily probiotics. Take medications as prescribed. Given and reviewed copy of ACP documents from [employer] and encouraged to complete and  return.      Decreased visual acuity    Referred to opthamology for further consideration      Relevant Orders   Ambulatory referral to Ophthalmology      I am having Ms. Sadiq maintain her amLODipine-olmesartan, cetirizine, furosemide, ranitidine, oxybutynin, and omeprazole.  No orders of the defined types were placed in this encounter.   CMA served as Neurosurgeon during this visit. History, Physical and Plan performed by medical provider. Documentation and orders reviewed and attested to.  Danise Edge, MD

## 2016-08-05 NOTE — Assessment & Plan Note (Signed)
Encouraged heart healthy diet, increase exercise, avoid trans fats, consider a krill oil cap daily 

## 2016-08-05 NOTE — Patient Instructions (Signed)
Dr Felicie Morn inserts  [facility] [facility] [employer]  Preventive Care 40-64 Years, Female Preventive care refers to lifestyle choices and visits with your health care provider that can promote health and wellness. What does preventive care include?  A yearly physical exam. This is also called an annual well check.  Dental exams once or twice a year.  Routine eye exams. Ask your health care provider how often you should have your eyes checked.  Personal lifestyle choices, including:  Daily care of your teeth and gums.  Regular physical activity.  Eating a healthy diet.  Avoiding tobacco and drug use.  Limiting alcohol use.  Practicing safe sex.  Taking low-dose aspirin daily starting at age 42.  Taking vitamin and mineral supplements as recommended by your health care provider. What happens during an annual well check? The services and screenings done by your health care provider during your annual well check will depend on your age, overall health, lifestyle risk factors, and family history of disease. Counseling  Your health care provider may ask you questions about your:  Alcohol use.  Tobacco use.  Drug use.  Emotional well-being.  Home and relationship well-being.  Sexual activity.  Eating habits.  Work and work Statistician.  Method of birth control.  Menstrual cycle.  Pregnancy history. Screening  You may have the following tests or measurements:  Height, weight, and BMI.  Blood pressure.  Lipid and cholesterol levels. These may be checked every 5 years, or more frequently if you are over 63 years old.  Skin check.  Lung cancer screening. You may have this screening every year starting at age 70 if you have a 30-pack-year history of smoking and currently smoke or have quit within the past 15 years.  Fecal occult blood test (FOBT) of the stool. You may have this test every year starting at age 55.  Flexible sigmoidoscopy or colonoscopy. You may  have a sigmoidoscopy every 5 years or a colonoscopy every 10 years starting at age 15.  Hepatitis C blood test.  Hepatitis B blood test.  Sexually transmitted disease (STD) testing.  Diabetes screening. This is done by checking your blood sugar (glucose) after you have not eaten for a while (fasting). You may have this done every 1-3 years.  Mammogram. This may be done every 1-2 years. Talk to your health care provider about when you should start having regular mammograms. This may depend on whether you have a family history of breast cancer.  BRCA-related cancer screening. This may be done if you have a family history of breast, ovarian, tubal, or peritoneal cancers.  Pelvic exam and Pap test. This may be done every 3 years starting at age 62. Starting at age 74, this may be done every 5 years if you have a Pap test in combination with an HPV test.  Bone density scan. This is done to screen for osteoporosis. You may have this scan if you are at high risk for osteoporosis. Discuss your test results, treatment options, and if necessary, the need for more tests with your health care provider. Vaccines  Your health care provider may recommend certain vaccines, such as:  Influenza vaccine. This is recommended every year.  Tetanus, diphtheria, and acellular pertussis (Tdap, Td) vaccine. You may need a Td booster every 10 years.  Varicella vaccine. You may need this if you have not been vaccinated.  Zoster vaccine. You may need this after age 51.  Measles, mumps, and rubella (MMR) vaccine. You may need at  least one dose of MMR if you were born in 1957 or later. You may also need a second dose.  Pneumococcal 13-valent conjugate (PCV13) vaccine. You may need this if you have certain conditions and were not previously vaccinated.  Pneumococcal polysaccharide (PPSV23) vaccine. You may need one or two doses if you smoke cigarettes or if you have certain conditions.  Meningococcal vaccine. You  may need this if you have certain conditions.  Hepatitis A vaccine. You may need this if you have certain conditions or if you travel or work in places where you may be exposed to hepatitis A.  Hepatitis B vaccine. You may need this if you have certain conditions or if you travel or work in places where you may be exposed to hepatitis B.  Haemophilus influenzae type b (Hib) vaccine. You may need this if you have certain conditions. Talk to your health care provider about which screenings and vaccines you need and how often you need them. This information is not intended to replace advice given to you by your health care provider. Make sure you discuss any questions you have with your health care provider. Document Released: 06/16/2015 Document Revised: 02/07/2016 Document Reviewed: 03/21/2015 Elsevier Interactive Patient Education  2017 [employer].

## 2016-09-05 ENCOUNTER — Other Ambulatory Visit: Payer: Self-pay | Admitting: Family Medicine

## 2016-09-05 DIAGNOSIS — K219 Gastro-esophageal reflux disease without esophagitis: Secondary | ICD-10-CM

## 2016-09-28 ENCOUNTER — Other Ambulatory Visit: Payer: Self-pay | Admitting: Family Medicine

## 2016-09-29 ENCOUNTER — Other Ambulatory Visit: Payer: Self-pay | Admitting: Family Medicine

## 2017-01-10 ENCOUNTER — Other Ambulatory Visit (INDEPENDENT_AMBULATORY_CARE_PROVIDER_SITE_OTHER): Payer: BC Managed Care – PPO

## 2017-01-10 DIAGNOSIS — I10 Essential (primary) hypertension: Secondary | ICD-10-CM | POA: Diagnosis not present

## 2017-01-10 DIAGNOSIS — E782 Mixed hyperlipidemia: Secondary | ICD-10-CM | POA: Diagnosis not present

## 2017-01-10 LAB — CBC
HCT: 39.5 % (ref 36.0–46.0)
HEMOGLOBIN: 12.7 g/dL (ref 12.0–15.0)
MCHC: 32.2 g/dL (ref 30.0–36.0)
MCV: 97.5 fl (ref 78.0–100.0)
PLATELETS: 316 10*3/uL (ref 150.0–400.0)
RBC: 4.05 Mil/uL (ref 3.87–5.11)
RDW: 14.6 % (ref 11.5–15.5)
WBC: 10.3 10*3/uL (ref 4.0–10.5)

## 2017-01-10 LAB — COMPREHENSIVE METABOLIC PANEL
ALBUMIN: 3.9 g/dL (ref 3.5–5.2)
ALT: 17 U/L (ref 0–35)
AST: 16 U/L (ref 0–37)
Alkaline Phosphatase: 76 U/L (ref 39–117)
BUN: 12 mg/dL (ref 6–23)
CALCIUM: 8.9 mg/dL (ref 8.4–10.5)
CHLORIDE: 108 meq/L (ref 96–112)
CO2: 31 mEq/L (ref 19–32)
CREATININE: 1.02 mg/dL (ref 0.40–1.20)
GFR: 70.11 mL/min (ref >60.00)
Glucose, Bld: 104 mg/dL — ABNORMAL HIGH (ref 70–99)
POTASSIUM: 3.4 meq/L — AB (ref 3.5–5.1)
Sodium: 143 mEq/L (ref 135–145)
Total Bilirubin: 0.6 mg/dL (ref 0.2–1.2)
Total Protein: 7 g/dL (ref 6.0–8.3)

## 2017-01-10 LAB — TSH: TSH: 2.18 u[IU]/mL (ref 0.35–4.50)

## 2017-01-10 LAB — LIPID PANEL
CHOLESTEROL: 146 mg/dL (ref 0–200)
HDL: 44.9 mg/dL (ref >39.00)
LDL CALC: 73 mg/dL (ref 0–99)
NonHDL: 101.24
TRIGLYCERIDES: 139 mg/dL (ref 0.0–149.0)
Total CHOL/HDL Ratio: 3
VLDL: 27.8 mg/dL (ref 0.0–40.0)

## 2017-01-14 ENCOUNTER — Encounter: Payer: Self-pay | Admitting: Family Medicine

## 2017-01-14 ENCOUNTER — Ambulatory Visit (INDEPENDENT_AMBULATORY_CARE_PROVIDER_SITE_OTHER): Payer: BC Managed Care – PPO | Admitting: Family Medicine

## 2017-01-14 DIAGNOSIS — I10 Essential (primary) hypertension: Secondary | ICD-10-CM | POA: Diagnosis not present

## 2017-01-14 DIAGNOSIS — E876 Hypokalemia: Secondary | ICD-10-CM | POA: Insufficient documentation

## 2017-01-14 DIAGNOSIS — J309 Allergic rhinitis, unspecified: Secondary | ICD-10-CM

## 2017-01-14 DIAGNOSIS — R0789 Other chest pain: Secondary | ICD-10-CM | POA: Diagnosis not present

## 2017-01-14 DIAGNOSIS — E782 Mixed hyperlipidemia: Secondary | ICD-10-CM

## 2017-01-14 DIAGNOSIS — J45909 Unspecified asthma, uncomplicated: Secondary | ICD-10-CM

## 2017-01-14 HISTORY — DX: Hypokalemia: E87.6

## 2017-01-14 HISTORY — DX: Other chest pain: R07.89

## 2017-01-14 LAB — BASIC METABOLIC PANEL
BUN: 13 mg/dL (ref 6–23)
CALCIUM: 8.7 mg/dL (ref 8.4–10.5)
CO2: 30 mEq/L (ref 19–32)
CREATININE: 0.93 mg/dL (ref 0.40–1.20)
Chloride: 106 mEq/L (ref 96–112)
GFR: 78 mL/min (ref >60.00)
Glucose, Bld: 98 mg/dL (ref 70–99)
Potassium: 3.9 mEq/L (ref 3.5–5.1)
Sodium: 142 mEq/L (ref 135–145)

## 2017-01-14 MED ORDER — MONTELUKAST SODIUM 10 MG PO TABS: 10.0000 mg | ORAL_TABLET | Freq: Every evening | ORAL | 5 refills | Status: DC | PRN

## 2017-01-14 MED ORDER — ASPIRIN EC 81 MG PO TBEC: 81.0000 mg | DELAYED_RELEASE_TABLET | Freq: Every day | ORAL | Status: DC

## 2017-01-14 NOTE — Assessment & Plan Note (Addendum)
Wheezing more is using Albuterol which is supplied by Dr Rosalva FerronVanWinkel
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
## 2017-01-14 NOTE — Assessment & Plan Note (Signed)
Increased congestion and wheezing, add Singulair

## 2017-01-14 NOTE — Assessment & Plan Note (Signed)
Well controlled, no changes to meds. Encouraged heart healthy diet such as the DASH diet and exercise as tolerated.  °

## 2017-01-14 NOTE — Assessment & Plan Note (Signed)
Encouraged heart healthy diet, increase exercise, avoid trans fats, consider a krill oil cap daily 
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
## 2017-01-14 NOTE — Patient Instructions (Addendum)
66 ozDASH Eating Plan DASH stands for "Dietary Approaches to Stop Hypertension." The DASH eating plan is a healthy eating plan that has been shown to reduce high blood pressure (hypertension). It may also reduce your risk for type 2 diabetes, heart disease, and stroke. The DASH eating plan may also help with weight loss. What are tips for following this plan? General guidelines  Avoid eating more than 2,300 mg (milligrams) of salt (sodium) a day. If you have hypertension, you may need to reduce your sodium intake to 1,500 mg a day.  Limit alcohol intake to no more than 1 drink a day for nonpregnant women and 2 drinks a day for men. One drink equals 12 oz of beer, 5 oz of wine, or 1 oz of hard liquor.  Work with your health care provider to maintain a healthy body weight or to lose weight. Ask what an ideal weight is for you.  Get at least 30 minutes of exercise that causes your heart to beat faster (aerobic exercise) most days of the week. Activities may include walking, swimming, or biking.  Work with your health care provider or diet and nutrition specialist (dietitian) to adjust your eating plan to your individual calorie needs. Reading food labels  Check food labels for the amount of sodium per serving. Choose foods with less than 5 percent of the Daily Value of sodium. Generally, foods with less than 300 mg of sodium per serving fit into this eating plan.  To find whole grains, look for the word "whole" as the first word in the ingredient list. Shopping  Buy products labeled as "low-sodium" or "no salt added."  Buy fresh foods. Avoid canned foods and premade or frozen meals. Cooking  Avoid adding salt when cooking. Use salt-free seasonings or herbs instead of table salt or sea salt. Check with your health care provider or pharmacist before using salt substitutes.  Do not fry foods. Cook foods using healthy methods such as baking, boiling, grilling, and broiling instead.  Cook with  heart-healthy oils, such as olive, canola, soybean, or sunflower oil. Meal planning   Eat a balanced diet that includes: ? 5 or more servings of fruits and vegetables each day. At each meal, try to fill half of your plate with fruits and vegetables. ? Up to 6-8 servings of whole grains each day. ? Less than 6 oz of lean meat, poultry, or fish each day. A 3-oz serving of meat is about the same size as a deck of cards. One egg equals 1 oz. ? 2 servings of low-fat dairy each day. ? A serving of nuts, seeds, or beans 5 times each week. ? Heart-healthy fats. Healthy fats called Omega-3 fatty acids are found in foods such as flaxseeds and coldwater fish, like sardines, salmon, and mackerel.  Limit how much you eat of the following: ? Canned or prepackaged foods. ? Food that is high in trans fat, such as fried foods. ? Food that is high in saturated fat, such as fatty meat. ? Sweets, desserts, sugary drinks, and other foods with added sugar. ? Full-fat dairy products.  Do not salt foods before eating.  Try to eat at least 2 vegetarian meals each week.  Eat more home-cooked food and less restaurant, buffet, and fast food.  When eating at a restaurant, ask that your food be prepared with less salt or no salt, if possible. What foods are recommended? The items listed may not be a complete list. Talk with your dietitian about  what dietary choices are best for you. Grains Whole-grain or whole-wheat bread. Whole-grain or whole-wheat pasta. Brown rice. Oatmeal. Quinoa. Bulgur. Whole-grain and low-sodium cereals. Pita bread. Low-fat, low-sodium crackers. Whole-wheat flour tortillas. Vegetables Fresh or frozen vegetables (raw, steamed, roasted, or grilled). Low-sodium or reduced-sodium tomato and vegetable juice. Low-sodium or reduced-sodium tomato sauce and tomato paste. Low-sodium or reduced-sodium canned vegetables. Fruits All fresh, dried, or frozen fruit. Canned fruit in natural juice (without  added sugar). Meat and other protein foods Skinless chicken or turkey. Ground chicken or turkey. Pork with fat trimmed off. Fish and seafood. Egg whites. Dried beans, peas, or lentils. Unsalted nuts, nut butters, and seeds. Unsalted canned beans. Lean cuts of beef with fat trimmed off. Low-sodium, lean deli meat. Dairy Low-fat (1%) or fat-free (skim) milk. Fat-free, low-fat, or reduced-fat cheeses. Nonfat, low-sodium ricotta or cottage cheese. Low-fat or nonfat yogurt. Low-fat, low-sodium cheese. Fats and oils Soft margarine without trans fats. Vegetable oil. Low-fat, reduced-fat, or light mayonnaise and salad dressings (reduced-sodium). Canola, safflower, olive, soybean, and sunflower oils. Avocado. Seasoning and other foods Herbs. Spices. Seasoning mixes without salt. Unsalted popcorn and pretzels. Fat-free sweets. What foods are not recommended? The items listed may not be a complete list. Talk with your dietitian about what dietary choices are best for you. Grains Baked goods made with fat, such as croissants, muffins, or some breads. Dry pasta or rice meal packs. Vegetables Creamed or fried vegetables. Vegetables in a cheese sauce. Regular canned vegetables (not low-sodium or reduced-sodium). Regular canned tomato sauce and paste (not low-sodium or reduced-sodium). Regular tomato and vegetable juice (not low-sodium or reduced-sodium). Pickles. Olives. Fruits Canned fruit in a light or heavy syrup. Fried fruit. Fruit in cream or butter sauce. Meat and other protein foods Fatty cuts of meat. Ribs. Fried meat. Bacon. Sausage. Bologna and other processed lunch meats. Salami. Fatback. Hotdogs. Bratwurst. Salted nuts and seeds. Canned beans with added salt. Canned or smoked fish. Whole eggs or egg yolks. Chicken or turkey with skin. Dairy Whole or 2% milk, cream, and half-and-half. Whole or full-fat cream cheese. Whole-fat or sweetened yogurt. Full-fat cheese. Nondairy creamers. Whipped toppings.  Processed cheese and cheese spreads. Fats and oils Butter. Stick margarine. Lard. Shortening. Ghee. Bacon fat. Tropical oils, such as coconut, palm kernel, or palm oil. Seasoning and other foods Salted popcorn and pretzels. Onion salt, garlic salt, seasoned salt, table salt, and sea salt. Worcestershire sauce. Tartar sauce. Barbecue sauce. Teriyaki sauce. Soy sauce, including reduced-sodium. Steak sauce. Canned and packaged gravies. Fish sauce. Oyster sauce. Cocktail sauce. Horseradish that you find on the shelf. Ketchup. Mustard. Meat flavorings and tenderizers. Bouillon cubes. Hot sauce and Tabasco sauce. Premade or packaged marinades. Premade or packaged taco seasonings. Relishes. Regular salad dressings. Where to find more information:  National Heart, Lung, and Blood Institute: www.nhlbi.nih.gov  American Heart Association: www.heart.org Summary  The DASH eating plan is a healthy eating plan that has been shown to reduce high blood pressure (hypertension). It may also reduce your risk for type 2 diabetes, heart disease, and stroke.  With the DASH eating plan, you should limit salt (sodium) intake to 2,300 mg a day. If you have hypertension, you may need to reduce your sodium intake to 1,500 mg a day.  When on the DASH eating plan, aim to eat more fresh fruits and vegetables, whole grains, lean proteins, low-fat dairy, and heart-healthy fats.  Work with your health care provider or diet and nutrition specialist (dietitian) to adjust your eating plan to your   individual calorie needs. This information is not intended to replace advice given to you by your health care provider. Make sure you discuss any questions you have with your health care provider. Document Released: 05/09/2011 Document Revised: 05/13/2016 Document Reviewed: 05/13/2016 Elsevier Interactive Patient Education  2017 [employer].

## 2017-01-14 NOTE — Assessment & Plan Note (Addendum)
Slight, asymptomatic repeat bmp

## 2017-01-14 NOTE — Assessment & Plan Note (Signed)
Encouraged DASH diet, decrease po intake and increase exercise as tolerated. Needs 7-8 hours of sleep nightly. Avoid trans fats, eat small, frequent meals every 4-5 hours with lean proteins, complex carbs and healthy fats. Minimize simple carbs, bariatric referral 

## 2017-01-20 ENCOUNTER — Encounter: Payer: Self-pay | Admitting: Cardiology

## 2017-01-20 ENCOUNTER — Ambulatory Visit (INDEPENDENT_AMBULATORY_CARE_PROVIDER_SITE_OTHER): Payer: BC Managed Care – PPO | Admitting: Cardiology

## 2017-01-20 VITALS — BP 110/64 | HR 68 | Resp 10 | Ht 65.0 in | Wt 266.1 lb

## 2017-01-20 DIAGNOSIS — R0789 Other chest pain: Secondary | ICD-10-CM | POA: Diagnosis not present

## 2017-01-20 DIAGNOSIS — G4733 Obstructive sleep apnea (adult) (pediatric): Secondary | ICD-10-CM

## 2017-01-20 DIAGNOSIS — I10 Essential (primary) hypertension: Secondary | ICD-10-CM | POA: Diagnosis not present
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
## 2017-01-20 NOTE — Patient Instructions (Addendum)
Medication Instructions:  Your physician recommends that you continue on your current medications as directed. Please refer to the Current Medication list given to you today.  Labwork: None   Testing/Procedures: Your physician has requested that you have an echocardiogram. Echocardiography is a painless test that uses sound waves to create images of your heart. It provides your doctor with information about the size and shape of your heart and how well your heart's chambers and valves are working. This procedure takes approximately one hour. There are no restrictions for this procedure.  Your physician has requested that you have an exercise tolerance test. For further information please visit [email or web address] Please also follow instruction sheet, as given.  Please report to [address], [address] [place], [place] the day of your testing.    Follow-Up: Your physician recommends that you schedule a follow-up appointment in: 1 month   Any Other Special Instructions Will Be Listed Below (If Applicable).  Please note that any paperwork needing to be filled out by the provider will need to be addressed at the front desk prior to seeing the provider. Please note that any paperwork FMLA, Disability or other documents regarding health condition is subject to a $25.00 charge that must be received prior to completion of paperwork in the form of a money order or check.    If you need a refill on your cardiac medications before your next appointment, please call your pharmacy.

## 2017-02-04 ENCOUNTER — Other Ambulatory Visit: Payer: Self-pay

## 2017-02-04 ENCOUNTER — Ambulatory Visit (HOSPITAL_COMMUNITY): Payer: BC Managed Care – PPO | Attending: Cardiology

## 2017-02-04 ENCOUNTER — Ambulatory Visit (INDEPENDENT_AMBULATORY_CARE_PROVIDER_SITE_OTHER): Payer: BC Managed Care – PPO

## 2017-02-04 DIAGNOSIS — I10 Essential (primary) hypertension: Secondary | ICD-10-CM | POA: Insufficient documentation

## 2017-02-04 DIAGNOSIS — R0789 Other chest pain: Secondary | ICD-10-CM

## 2017-02-04 LAB — EXERCISE TOLERANCE TEST
CHL CUP MPHR: 156 {beats}/min
CHL CUP RESTING HR STRESS: 64 {beats}/min
CSEPEDS: 0 s
CSEPHR: 87 %
Estimated workload: 4.6 METS
Exercise duration (min): 3 min
Peak HR: 136 {beats}/min
RPE: 18

## 2017-02-18 ENCOUNTER — Other Ambulatory Visit: Payer: Self-pay | Admitting: Family Medicine

## 2017-02-24 ENCOUNTER — Encounter: Payer: Self-pay | Admitting: Cardiology

## 2017-02-24 ENCOUNTER — Ambulatory Visit (INDEPENDENT_AMBULATORY_CARE_PROVIDER_SITE_OTHER): Payer: BC Managed Care – PPO | Admitting: Cardiology

## 2017-02-24 VITALS — BP 120/72 | HR 72 | Resp 12 | Ht 65.0 in | Wt 270.8 lb

## 2017-02-24 DIAGNOSIS — E782 Mixed hyperlipidemia: Secondary | ICD-10-CM | POA: Diagnosis not present

## 2017-02-24 DIAGNOSIS — R0789 Other chest pain: Secondary | ICD-10-CM | POA: Diagnosis not present

## 2017-02-24 DIAGNOSIS — G4730 Sleep apnea, unspecified: Secondary | ICD-10-CM

## 2017-02-24 DIAGNOSIS — I10 Essential (primary) hypertension: Secondary | ICD-10-CM | POA: Diagnosis not present

## 2017-02-24 NOTE — Addendum Note (Signed)
Addended by: Rodney Langton on: 02/24/2017 02:30 PM   Modules accepted: Orders

## 2017-02-24 NOTE — Progress Notes (Signed)
Cardiology Office Note:    Date:  02/24/2017   ID:  Terri [name], DOB [birth year], MRN [id number]  PCP:  Bradd Canary, MD  Cardiologist:  Gypsy Balsam, MD    Referring MD: Bradd Canary, MD   Chief Complaint  Patient presents with  . 1 month follow up  she is doing fine and had only 1 episode chest pain since I seen her last time  History of Present Illness:    Terri [name] is a 66 y.o. female  With atypical chest pain. Since that seen her la she only having one episode which happened when she was working lasting only split-second. She did have a stress test which wasnegative for exercise-induced myocardial ischemia.Echocardiogram showed normal left ventricular ejection fraction. Right ventricular size and function was normal. She does have sleep apnea and we awaiting sleep study.We spent majority of time today talking about risk factors modifications, diet and exercises. She determined that she wants to do this and lose some weight.  Past Medical History:  Diagnosis Date  . Allergic rhinitis   . Apnea, sleep 06/28/2012   Previously used CPAP  . Atypical chest pain 01/14/2017  . Decreased visual acuity 08/05/2016  . Depression 06/19/2015  . Hip pain, right 06/28/2012  . Hypertension   . Hypokalemia 01/14/2017  . Morbid obesity (HCC) 04/11/2008   Qualifier: Diagnosis of  By: [name]   . Numbness in feet 05/06/2016  . Obesity   . Osteoarthritis 01/16/2010   Qualifier: Diagnosis of  By: [name]   . Preventative health care 06/25/2015    Past Surgical History:  Procedure Laterality Date  . childbirth      Current Medications: Current Meds  Medication Sig  . amLODipine-olmesartan (AZOR) 10-40 MG tablet Take 1 tablet by mouth daily.  Marland Kitchen aspirin EC 81 MG tablet Take 1 tablet (81 mg total) by mouth daily.  . cetirizine (ZYRTEC) 10 MG tablet Take 10 mg by mouth daily.  . montelukast (SINGULAIR) 10 MG tablet Take 1 tablet (10 mg total) by mouth at  bedtime as needed.  Marland Kitchen omeprazole (PRILOSEC) 40 MG capsule TAKE ONE CAPSULE BY MOUTH EVERY DAY  . ranitidine (ZANTAC) 300 MG tablet TAKE 1 TABLET (300 MG TOTAL) BY MOUTH AT BEDTIME AS NEEDED FOR HEARTBURN.     Allergies:   Sulfonamide derivatives   Social History   Social History  . Marital status: Single    Spouse name: N/A  . Number of children: N/A  . Years of education: N/A   Occupational History  . HS music teacher- [employer]    Social History Main Topics  . Smoking status: Never Smoker  . Smokeless tobacco: Never Used  . Alcohol use Yes     Comment: social  . Drug use: No  . 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 None   Social History Narrative  . None     Family History: The patient's family history includes Cancer in her father and paternal grandfather; Liver disease in her brother and mother; Mental illness in her brother; Stroke in her maternal grandmother and mother. ROS:   Please see the history of present illness.    All 14 point review of systems negative except as described per history of present illness  EKGs/Labs/Other Studies Reviewed:      Recent Labs: 01/10/2017: ALT 17; Hemoglobin  12.7; Platelets 316.0; TSH 2.18 01/14/2017: BUN 13; Creatinine, Ser 0.93; Potassium 3.9; Sodium 142  Recent Lipid Panel    Component Value Date/Time   CHOL 146 01/10/2017 1404   TRIG 139.0 01/10/2017 1404   HDL 44.90 01/10/2017 1404   CHOLHDL 3 01/10/2017 1404   VLDL 27.8 01/10/2017 1404   LDLCALC 73 01/10/2017 1404    Physical Exam:    VS:  BP 120/72   Pulse 72   Resp 12   Ht  (1.651 m)   Wt 270 lb 12.8 oz (122.8 kg)   BMI 45.06 kg/m     Wt Readings from Last 3 Encounters:  02/24/17 270 lb 12.8 oz (122.8 kg)  01/20/17 266 lb 1.9 oz (120.7 kg)  01/14/17 269 lb 6.4 oz (122.2 kg)     GEN:  Well nourished, well developed in no acute distress HEENT:  Normal NECK: No JVD; No carotid bruits LYMPHATICS: No lymphadenopathy CARDIAC: RRR, no murmurs, no rubs, no gallops RESPIRATORY:  Clear to auscultation without rales, wheezing or rhonchi  ABDOMEN: Soft, non-tender, non-distended MUSCULOSKELETAL:  No edema; No deformity  SKIN: Warm and dry LOWER EXTREMITIES: no swelling NEUROLOGIC:  Alert and oriented x 3 PSYCHIATRIC:  Normal affect   ASSESSMENT:    1. Essential hypertension   2. Atypical chest pain   3. Morbid obesity (HCC)   4. Hyperlipidemia, mixed    PLAN:    In order of problems listed above:  1. ssential hypertension: Blood pressure well-controlled I will continue present medications. 2. Atypical chest pain: Doing well from that point of view stressed this analyzed with patient's showing no evidence of ischemia. 3. Morbid obesity: She is the timing to go on diet and exercise. 4. Hyperlipidemia: We'll try to get fasting lipid profile from primary care physician  I see him back in last about 3 months we'll revisit the issue ofcoronary artery disease prevention as well as exercises and dieting.   Medication Adjustments/Labs and Tests Ordered: Current medicines are reviewed at length with the patient today.  Concerns regarding medicines are outlined above.  No orders of the defined types were placed in this encounter.  Medication changes: No orders of the defined types were placed in this encounter.   Signed, Georgeanna Lea, MD, [facility] 02/24/2017 2:20 PM    [facility]

## 2017-02-24 NOTE — Patient Instructions (Addendum)
Medication Instructions:  Your physician recommends that you continue on your current medications as directed. Please refer to the Current Medication list given to you today.  Labwork: None    Testing/Procedures: None   Follow-Up: Your physician recommends that you schedule a follow-up appointment in: 3 months Another referral has been sent to see if a sleep study could be arranged sooner.    Any Other Special Instructions Will Be Listed Below (If Applicable).  Please note that any paperwork needing to be filled out by the provider will need to be addressed at the front desk prior to seeing the provider. Please note that any paperwork FMLA, Disability or other documents regarding health condition is subject to a $25.00 charge that must be received prior to completion of paperwork in the form of a money order or check.     If you need a refill on your cardiac medications before your next appointment, please call your pharmacy.

## 2017-03-28 ENCOUNTER — Other Ambulatory Visit: Payer: Self-pay | Admitting: Family Medicine

## 2017-03-31 ENCOUNTER — Ambulatory Visit (INDEPENDENT_AMBULATORY_CARE_PROVIDER_SITE_OTHER): Payer: BC Managed Care – PPO | Admitting: Neurology

## 2017-03-31 ENCOUNTER — Encounter: Payer: Self-pay | Admitting: Neurology

## 2017-03-31 ENCOUNTER — Encounter (INDEPENDENT_AMBULATORY_CARE_PROVIDER_SITE_OTHER): Payer: Self-pay

## 2017-03-31 VITALS — BP 143/73 | HR 61 | Ht 65.0 in | Wt 273.0 lb

## 2017-03-31 DIAGNOSIS — R0789 Other chest pain: Secondary | ICD-10-CM

## 2017-03-31 DIAGNOSIS — R062 Wheezing: Secondary | ICD-10-CM

## 2017-03-31 DIAGNOSIS — J4520 Mild intermittent asthma, uncomplicated: Secondary | ICD-10-CM

## 2017-03-31 DIAGNOSIS — R600 Localized edema: Secondary | ICD-10-CM | POA: Diagnosis not present

## 2017-03-31 DIAGNOSIS — Z6841 Body Mass Index (BMI) 40.0 and over, adult: Secondary | ICD-10-CM | POA: Diagnosis not present

## 2017-03-31 DIAGNOSIS — G4733 Obstructive sleep apnea (adult) (pediatric): Secondary | ICD-10-CM

## 2017-03-31 NOTE — Patient Instructions (Signed)

## 2017-03-31 NOTE — Progress Notes (Signed)
Subjective:    [name] is a 66 y.o. female.  HPI     Terri Foley, MD, PhD [facility] [address], [address] [address] [place], [place] [address]  Dear Dr. Bing Matter,   I saw your patient, Terri [name], upon your kind request in my neurologic clinic today for initial consultation of her sleep disorder, in particular, concern for underlying obstructive sleep apnea. The patient is unaccompanied today. As you know, Terri [name] is a 66 year old right-handed woman with an underlying medical history of asthma, allergies, chest pain, depression, hypertension, reflux disease, osteoarthritis and morbid obesity with a BMI of over 40, who was previously diagnosed with obstructive sleep apnea and placed on CPAP therapy in the past. Her sleep study results are not available for my review today. I reviewed your office note from 02/24/2017. She no longer has a CPAP machine. Her Epworth sleepiness score is 9 out of 24, fatigue score is 44 out of 63. 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 She lives with her son and his family. She is a nonsmoker and drinks alcohol occasionally, caffeine occasionally.She had an exercise stress test as well as an echocardiogram on 02/04/2017 with benign results thankfully. Bedtime is around 9 PM, wakeup time around 6. She has had lower extremity swelling for years. Her son has been diagnosed with sleep apnea and she has a few cousins with sleep apnea. She has gained weight.   Her Past Medical History Is Significant For: Past Medical History:  Diagnosis Date  . Allergic rhinitis   . Apnea, sleep 06/28/2012   Previously used CPAP  . Atypical chest pain 01/14/2017  . Decreased visual acuity 08/05/2016  . Depression 06/19/2015  . Hip pain, right 06/28/2012  . Hypertension   . Hypokalemia 01/14/2017  . Morbid obesity (HCC) 04/11/2008   Qualifier: Diagnosis of  By: [name]   . Numbness in  feet 05/06/2016  . Obesity   . Osteoarthritis 01/16/2010   Qualifier: Diagnosis of  By: [name]   . Preventative health care 06/25/2015    Her Past Surgical History Is Significant For: Past Surgical History:  Procedure Laterality Date  . childbirth      Her Family History Is Significant For: Family History  Problem Relation Age of Onset  . Stroke Mother   . Liver disease Mother        hepatitis b  . Cancer Father        esophagus, prostate  . Liver disease Brother        hep c  . Mental illness Brother        PTSD from [place]  . Stroke Maternal Grandmother   . Cancer Paternal Grandfather        prostate    Her Social History Is Significant For: Social History   Social History  . Marital status: Single    Spouse name: N/A  . Number of children: N/A  . Years of education: N/A   Occupational History  . HS music teacher- [employer]    Social History Main Topics  . Smoking status: Never Smoker  . Smokeless tobacco: Never Used  . Alcohol use Yes     Comment: social  . Drug use: No  . 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 None  Social History Narrative  . None    Her Allergies Are:  Allergies  Allergen Reactions  . Sulfonamide Derivatives   :   Her Current Medications Are:  Outpatient Encounter Prescriptions as of 03/31/2017  Medication Sig  . amLODipine-olmesartan (AZOR) 10-40 MG tablet TAKE 1 TABLET BY MOUTH EVERY DAY  . aspirin EC 81 MG tablet Take 1 tablet (81 mg total) by mouth daily.  Marland Kitchen. levocetirizine (XYZAL) 5 MG tablet Take 5 mg by mouth every evening.  . montelukast (SINGULAIR) 10 MG tablet Take 1 tablet (10 mg total) by mouth at bedtime as needed.  Marland Kitchen. omeprazole (PRILOSEC) 40 MG capsule TAKE ONE CAPSULE BY MOUTH EVERY DAY  . ranitidine (ZANTAC) 300 MG tablet TAKE 1 TABLET (300 MG TOTAL) BY MOUTH AT BEDTIME AS NEEDED FOR HEARTBURN.  .  [DISCONTINUED] amLODipine-olmesartan (AZOR) 10-40 MG tablet Take 1 tablet by mouth daily.  . [DISCONTINUED] cetirizine (ZYRTEC) 10 MG tablet Take 10 mg by mouth daily.   No facility-administered encounter medications on file as of 03/31/2017.   :  Review of Systems:  Out of a complete 14 point review of systems, all are reviewed and negative with the exception of these symptoms as listed below: Review of Systems  Neurological:       Pt presents today to discuss her sleep. Pt had a sleep study and used cpap many years ago but has since stopped using cpap.  Epworth Sleepiness Scale 0= would never doze 1= slight chance of dozing 2= moderate chance of dozing 3= high chance of dozing  Sitting and reading: 2 Watching TV: 3 Sitting inactive in a public place (ex. Theater or meeting): 0 As a passenger in a car for an hour without a break: 1 Lying down to rest in the afternoon: 3 Sitting and talking to someone: 0 Sitting quietly after lunch (no alcohol): 0 In a car, while stopped in traffic: 0 Total: 9     Objective:  Neurological Exam  Physical Exam Physical Examination:   Vitals:   03/31/17 1054  BP: (!) 143/73  Pulse: 61   General Examination: The patient is a very pleasant 66 y.o. female in no acute distress. She appears well-developed and well-nourished and well groomed.   HEENT: Normocephalic, atraumatic, pupils are equal, round and reactive to light and accommodation. Extraocular tracking is good without limitation to gaze excursion or nystagmus noted. Normal smooth pursuit is noted. Hearing is grossly intact. Tympanic membranes are clear bilaterally. Face is symmetric with normal facial animation and normal facial sensation. Speech is clear with no dysarthria noted. There is no hypophonia. There is no lip, neck/head, jaw or voice tremor. Neck is supple with full range of passive and active motion. There are no carotid bruits on auscultation. Oropharynx exam reveals: mild  mouth dryness, adequate to marginal dental hygiene and marked airway crowding, due to smaller airway and larger uvula and tonsils. Mallampati is class III. Tongue protrudes centrally and palate elevates symmetrically. Neck size is 16.5 inches. She has a Mild overbite.   Chest: Clear to auscultation with sight end-expiratory wheeze noted.   Heart: S1+S2+0, regular and normal without murmurs, rubs or gallops noted.   Abdomen: Soft, non-tender and non-distended with normal bowel sounds appreciated on auscultation.  Extremities: There is 2+ pitting edema in the distal lower extremities bilaterally, L more than R.  Skin: Warm and dry without trophic changes noted.  Musculoskeletal: exam reveals no obvious joint deformities, tenderness or joint swelling or erythema.   Neurologically:  Mental status: The patient is awake, alert and oriented in all 4 spheres. Her immediate and remote memory, attention, language skills and fund of knowledge are appropriate. There is no evidence of aphasia, agnosia, apraxia or anomia. Speech is clear with normal prosody and enunciation. Thought process is linear. Mood is normal and affect is normal.  Cranial nerves II - XII are as described above under HEENT exam. In addition: shoulder shrug is normal with equal shoulder height noted. Motor exam: Normal bulk, strength and tone is noted. There is no drift, tremor or rebound. Romberg is negative. Reflexes are 2+ throughout. Fine motor skills and coordination: intact with normal finger taps, normal hand movements, normal rapid alternating patting, normal foot taps and normal foot agility.  Cerebellar testing: No dysmetria or intention tremor on finger to nose testing. Heel to shin is unremarkable bilaterally. There is no truncal or gait ataxia.  Sensory exam: intact to light touch in the upper and lower extremities.  Gait, station and balance: She stands easily. No veering to one side is noted. No leaning to one side is  noted. Posture is age-appropriate and stance is narrow based. Gait shows normal stride length and normal pace. No problems turning are noted. Tandem walk is unremarkable. Intact toe and heel stance is noted.               Assessment and Plan:  In summary, SKYYLAR KOPF is a very pleasant 66 y.o.-year old female with an underlying medical history of asthma, allergies, chest pain, depression, hypertension, reflux disease, osteoarthritis and morbid obesity with a BMI of over 40, who was Previously diagnosed with obstructive sleep apnea but is no longer on CPAP therapy. She estimates that she was diagnosed in or around 2005. She needs reevaluation especially given her history of lower extremity swelling, recent chest pain, and weight gain.  I had a long chat with the patient about my findings and the diagnosis of OSA, its prognosis and treatment options. We talked about medical treatments, surgical interventions and non-pharmacological approaches. I explained in particular the risks and ramifications of untreated moderate to severe OSA, especially with respect to developing cardiovascular disease down the Road, including congestive heart failure, difficult to treat hypertension, cardiac arrhythmias, or stroke. Even type 2 diabetes has, in part, been linked to untreated OSA. Symptoms of untreated OSA include daytime sleepiness, memory problems, mood irritability and mood disorder such as depression and anxiety, lack of energy, as well as recurrent headaches, especially morning headaches. We talked about Notrying to maintain a healthy lifestyle in general, as well as the importance of weight control. I encouraged the patient to eat healthy, exercise daily and keep well hydrated, to keep a scheduled bedtime and wake time routine, to not skip any meals and eat healthy snacks in between meals. I advised the patient not to drive when feeling sleepy. I recommended the following at this time: sleep study with potential  positive airway pressure titration. (We will score hypopneas at 3%).   I explained the sleep test procedure to the patient and also outlined possible surgical and non-surgical treatment options of OSA, including the use of a custom-made dental device (which would require a referral to a specialist dentist or oral surgeon), upper airway surgical options, such as pillar implants, radiofrequency surgery, tongue base surgery, and UPPP (which would involve a referral to an ENT surgeon). Rarely, jaw surgery such as mandibular advancement may be considered.  I also explained the CPAP treatment option to the patient,  who indicated that she would be willing to try CPAP if the need arises. I explained the importance of being compliant with PAP treatment, not only for insurance purposes but primarily to improve Her symptoms, and for the patient's long term health benefit, including to reduce Her cardiovascular risks. I answered all her questions today and the patient was in agreement. I would like to see her back after the sleep study is completed and encouraged her to call with any interim questions, concerns, problems or updates.   Thank you very much for allowing me to participate in the care of this nice patient. If I can be of any further assistance to you please do not hesitate to call me at [phone].  Sincerely,   Star Age, MD, PhD

## 2017-04-09 ENCOUNTER — Encounter: Payer: Self-pay | Admitting: Family Medicine

## 2017-04-15 ENCOUNTER — Ambulatory Visit: Payer: BC Managed Care – PPO | Admitting: Family Medicine

## 2017-04-15 ENCOUNTER — Encounter: Payer: Self-pay | Admitting: Family Medicine

## 2017-04-15 DIAGNOSIS — M1990 Unspecified osteoarthritis, unspecified site: Secondary | ICD-10-CM | POA: Diagnosis not present

## 2017-04-15 DIAGNOSIS — E782 Mixed hyperlipidemia: Secondary | ICD-10-CM | POA: Diagnosis not present

## 2017-04-15 DIAGNOSIS — G4733 Obstructive sleep apnea (adult) (pediatric): Secondary | ICD-10-CM | POA: Diagnosis not present

## 2017-04-15 DIAGNOSIS — I10 Essential (primary) hypertension: Secondary | ICD-10-CM | POA: Diagnosis not present

## 2017-04-15 NOTE — Assessment & Plan Note (Signed)
Low back and right hip act up at times. Encouraged Lidocaine prn. Will call if needs a referral

## 2017-04-15 NOTE — Assessment & Plan Note (Signed)
Encouraged DASH diet, decrease po intake and increase exercise as tolerated. Needs 7-8 hours of sleep nightly. Avoid trans fats, eat small, frequent meals every 4-5 hours with lean proteins, complex carbs and healthy fats. Minimize simple carbs, GMO foods. 

## 2017-04-15 NOTE — Assessment & Plan Note (Signed)
Encouraged heart healthy diet, increase exercise, avoid trans fats, consider a krill oil cap daily 

## 2017-04-15 NOTE — Patient Instructions (Addendum)
DASH or MIND diet  Lidocaine gel or patches by Aspercreme, Salon Pas or Icy Hot  Shingrix is the new shingles shot, 2 shots over 2-6 month, can get here or at pharmacy Hypertension Hypertension, commonly called high blood pressure, is when the force of blood pumping through the arteries is too strong. The arteries are the blood vessels that carry blood from the heart throughout the body. Hypertension forces the heart to work harder to pump blood and may cause arteries to become narrow or stiff. Having untreated or uncontrolled hypertension can cause heart attacks, strokes, kidney disease, and other problems. A blood pressure reading consists of a higher number over a lower number. Ideally, your blood pressure should be below 120/80. The first ("top") number is called the systolic pressure. It is a measure of the pressure in your arteries as your heart beats. The second ("bottom") number is called the diastolic pressure. It is a measure of the pressure in your arteries as the heart relaxes. What are the causes? The cause of this condition is not known. What increases the risk? Some risk factors for high blood pressure are under your control. Others are not. Factors you can change  Smoking.  Having type 2 diabetes mellitus, high cholesterol, or both.  Not getting enough exercise or physical activity.  Being overweight.  Having too much fat, sugar, calories, or salt (sodium) in your diet.  Drinking too much alcohol. Factors that are difficult or impossible to change  Having chronic kidney disease.  Having a family history of high blood pressure.  Age. Risk increases with age.  Race. You may be at higher risk if you are African-American.  Gender. Men are at higher risk than women before age 64. After age 165, women are at higher risk than men.  Having obstructive sleep apnea.  Stress. What are the signs or symptoms? Extremely high blood pressure (hypertensive crisis) may  cause:  Headache.  Anxiety.  Shortness of breath.  Nosebleed.  Nausea and vomiting.  Severe chest pain.  Jerky movements you cannot control (seizures).  How is this diagnosed? This condition is diagnosed by measuring your blood pressure while you are seated, with your arm resting on a surface. The cuff of the blood pressure monitor will be placed directly against the skin of your upper arm at the level of your heart. It should be measured at least twice using the same arm. Certain conditions can cause a difference in blood pressure between your right and left arms. Certain factors can cause blood pressure readings to be lower or higher than normal (elevated) for a short period of time:  When your blood pressure is higher when you are in a health care provider's office than when you are at home, this is called white coat hypertension. Most people with this condition do not need medicines.  When your blood pressure is higher at home than when you are in a health care provider's office, this is called masked hypertension. Most people with this condition may need medicines to control blood pressure.  If you have a high blood pressure reading during one visit or you have normal blood pressure with other risk factors:  You may be asked to return on a different day to have your blood pressure checked again.  You may be asked to monitor your blood pressure at home for 1 week or longer.  If you are diagnosed with hypertension, you may have other blood or imaging tests to help your health care  provider understand your overall risk for other conditions. How is this treated? This condition is treated by making healthy lifestyle changes, such as eating healthy foods, exercising more, and reducing your alcohol intake. Your health care provider may prescribe medicine if lifestyle changes are not enough to get your blood pressure under control, and if:  Your systolic blood pressure is above  130.  Your diastolic blood pressure is above 80.  Your personal target blood pressure may vary depending on your medical conditions, your age, and other factors. Follow these instructions at home: Eating and drinking  Eat a diet that is high in fiber and potassium, and low in sodium, added sugar, and fat. An example eating plan is called the DASH (Dietary Approaches to Stop Hypertension) diet. To eat this way: ? Eat plenty of fresh fruits and vegetables. Try to fill half of your plate at each meal with fruits and vegetables. ? Eat whole grains, such as whole wheat pasta, brown rice, or whole grain bread. Fill about one quarter of your plate with whole grains. ? Eat or drink low-fat dairy products, such as skim milk or low-fat yogurt. ? Avoid fatty cuts of meat, processed or cured meats, and poultry with skin. Fill about one quarter of your plate with lean proteins, such as fish, chicken without skin, beans, eggs, and tofu. ? Avoid premade and processed foods. These tend to be higher in sodium, added sugar, and fat.  Reduce your daily sodium intake. Most people with hypertension should eat less than 1,500 mg of sodium a day.  Limit alcohol intake to no more than 1 drink a day for nonpregnant women and 2 drinks a day for men. One drink equals 12 oz of beer, 5 oz of wine, or 1 oz of hard liquor. Lifestyle  Work with your health care provider to maintain a healthy body weight or to lose weight. Ask what an ideal weight is for you.  Get at least 30 minutes of exercise that causes your heart to beat faster (aerobic exercise) most days of the week. Activities may include walking, swimming, or biking.  Include exercise to strengthen your muscles (resistance exercise), such as pilates or lifting weights, as part of your weekly exercise routine. Try to do these types of exercises for 30 minutes at least 3 days a week.  Do not use any products that contain nicotine or tobacco, such as cigarettes and  e-cigarettes. If you need help quitting, ask your health care provider.  Monitor your blood pressure at home as told by your health care provider.  Keep all follow-up visits as told by your health care provider. This is important. Medicines  Take over-the-counter and prescription medicines only as told by your health care provider. Follow directions carefully. Blood pressure medicines must be taken as prescribed.  Do not skip doses of blood pressure medicine. Doing this puts you at risk for problems and can make the medicine less effective.  Ask your health care provider about side effects or reactions to medicines that you should watch for. Contact a health care provider if:  You think you are having a reaction to a medicine you are taking.  You have headaches that keep coming back (recurring).  You feel dizzy.  You have swelling in your ankles.  You have trouble with your vision. Get help right away if:  You develop a severe headache or confusion.  You have unusual weakness or numbness.  You feel faint.  You have severe pain in  your chest or abdomen.  You vomit repeatedly.  You have trouble breathing. Summary  Hypertension is when the force of blood pumping through your arteries is too strong. If this condition is not controlled, it may put you at risk for serious complications.  Your personal target blood pressure may vary depending on your medical conditions, your age, and other factors. For most people, a normal blood pressure is less than 120/80.  Hypertension is treated with lifestyle changes, medicines, or a combination of both. Lifestyle changes include weight loss, eating a healthy, low-sodium diet, exercising more, and limiting alcohol. This information is not intended to replace advice given to you by your health care provider. Make sure you discuss any questions you have with your health care provider. Document Released: 05/20/2005 Document Revised: 04/17/2016  Document Reviewed: 04/17/2016 Elsevier Interactive Patient Education  [employer]2018 [employer]

## 2017-04-15 NOTE — Assessment & Plan Note (Signed)
Has as a sleep study in the next month.

## 2017-04-15 NOTE — Assessment & Plan Note (Signed)
Well controlled, no changes to meds. Encouraged heart healthy diet such as the DASH diet and exercise as tolerated.  °

## 2017-04-15 NOTE — Progress Notes (Signed)
Subjective:  I acted as a Neurosurgeon for Dr. Abner Greenspan. [name], [name]  [name], female    DOB: Jul 06, 1950, 66 y.o.   MRN: [id number]  No chief complaint on file.   HPI  Patient is in today for a 3 month follow up and overall is feeling well. No recent febrile illness or hospitalizations. She has ongoing fatigue and denies any other acute concerns. Denies CP/palp/SOB/HA/congestion/fevers/GI or GU c/o. Taking meds as prescribed  Patient Care Team: Bradd Canary, MD as PCP - General (Family Medicine)   Past Medical History:  Diagnosis Date  . Allergic rhinitis   . Apnea, sleep 06/28/2012   Previously used CPAP  . Atypical chest pain 01/14/2017  . Decreased visual acuity 08/05/2016  . Depression 06/19/2015  . Hip pain, right 06/28/2012  . Hypertension   . Hypokalemia 01/14/2017  . Morbid obesity (HCC) 04/11/2008   Qualifier: Diagnosis of  By: [name]   . Numbness in feet 05/06/2016  . Obesity   . Osteoarthritis 01/16/2010   Qualifier: Diagnosis of  By: [name]   . Preventative health care 06/25/2015    Past Surgical History:  Procedure Laterality Date  . childbirth      Family History  Problem Relation Age of Onset  . Stroke Mother   . Liver disease Mother        hepatitis b  . Cancer Father        esophagus, prostate  . Liver disease Brother        hep c  . Mental illness Brother        PTSD from [place]  . Stroke Maternal Grandmother   . Cancer Paternal Grandfather        prostate    Social History   Socioeconomic History  . Marital status: Single    Spouse name: Not on file  . Number of children: Not on file  . Years of education: Not on file  . Highest education level: Not on file  Social Needs  . Financial resource strain: Not on file  . Food insecurity - worry: Not on file  . Food insecurity - inability: Not on file  . Transportation needs - medical: Not on file  . Transportation needs - non-medical: Not on file    Occupational History  . Occupation: HS music teacher- Location manager  Tobacco Use  . Smoking status: Never Smoker  . Smokeless tobacco: Never Used  Substance and Sexual Activity  . Alcohol use: Yes    Comment: social  . Drug use: No  . 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 no dietary restrictions  Other Topics Concern  . Not on file  Social History Narrative  . Not on file    Outpatient Medications Prior to Visit  Medication Sig Dispense Refill  . amLODipine-olmesartan (AZOR) 10-40 MG tablet TAKE 1 TABLET BY MOUTH EVERY DAY 90 tablet 1  . aspirin EC 81 MG tablet Take 1 tablet (81 mg total) by mouth daily.    Marland Kitchen levocetirizine (XYZAL) 5 MG tablet Take 5 mg by mouth every evening.    . montelukast (SINGULAIR) 10 MG tablet Take 1 tablet (10 mg total) by mouth at bedtime as needed. 30 tablet 5  . omeprazole (PRILOSEC) 40 MG capsule TAKE ONE CAPSULE BY MOUTH EVERY DAY 90 capsule 0  . ranitidine (ZANTAC) 300 MG tablet TAKE 1 TABLET (300 MG TOTAL) BY MOUTH  AT BEDTIME AS NEEDED FOR HEARTBURN. 30 tablet 3   No facility-administered medications prior to visit.     Allergies  Allergen Reactions  . Sulfonamide Derivatives     Review of Systems  Constitutional: Positive for malaise/fatigue. Negative for fever.  HENT: Negative for congestion.   Eyes: Negative for blurred vision.  Respiratory: Negative for cough and shortness of breath.   Cardiovascular: Negative for chest pain, palpitations and leg swelling.  Gastrointestinal: Negative for vomiting.  Musculoskeletal: Positive for joint pain. Negative for back pain.  Skin: Negative for rash.  Neurological: Negative for loss of consciousness and headaches.       Objective:    Physical Exam  Constitutional: She is oriented to person, place, and time. She appears well-developed and well-nourished. No distress.  HENT:  Head: Normocephalic and atraumatic.  Eyes: Conjunctivae are normal.  Neck:  Normal range of motion. No thyromegaly present.  Cardiovascular: Normal rate and regular rhythm.  Pulmonary/Chest: Effort normal and breath sounds normal. She has no wheezes.  Abdominal: Soft. Bowel sounds are normal. There is no tenderness.  Musculoskeletal: Normal range of motion. She exhibits no edema or deformity.  Neurological: She is alert and oriented to person, place, and time.  Skin: Skin is warm and dry. She is not diaphoretic.  Psychiatric: She has a normal mood and affect.    BP 136/65 (BP Location: Left Arm, Patient Position: Sitting, Cuff Size: Normal)   Pulse 72   Temp 98.2 F (36.8 C) (Oral)   Resp 18   Wt 273 lb 12.8 oz (124.2 kg)   SpO2 100%   BMI 45.56 kg/m  Wt Readings from Last 3 Encounters:  04/15/17 273 lb 12.8 oz (124.2 kg)  03/31/17 273 lb (123.8 kg)  02/24/17 270 lb 12.8 oz (122.8 kg)   BP Readings from Last 3 Encounters:  04/15/17 136/65  03/31/17 (!) 143/73  02/24/17 120/72     Immunization History  Administered Date(s) Administered  . Td 11/29/2008    Health Maintenance  Topic Date Due  . INFLUENZA VACCINE  01/01/2017  . COLONOSCOPY  11/04/2017  . MAMMOGRAM  02/28/2018  . TETANUS/TDAP  11/30/2018  . PAP SMEAR  02/26/2019  . Hepatitis C Screening  Completed  . HIV Screening  Addressed    Lab Results  Component Value Date   WBC 10.3 01/10/2017   HGB 12.7 01/10/2017   HCT 39.5 01/10/2017   PLT 316.0 01/10/2017   GLUCOSE 98 01/14/2017   CHOL 146 01/10/2017   TRIG 139.0 01/10/2017   HDL 44.90 01/10/2017   LDLCALC 73 01/10/2017   ALT 17 01/10/2017   AST 16 01/10/2017   NA 142 01/14/2017   K 3.9 01/14/2017   CL 106 01/14/2017   CREATININE 0.93 01/14/2017   BUN 13 01/14/2017   CO2 30 01/14/2017   TSH 2.18 01/10/2017   HGBA1C 5.4 01/18/2009    Lab Results  Component Value Date   TSH 2.18 01/10/2017   Lab Results  Component Value Date   WBC 10.3 01/10/2017   HGB 12.7 01/10/2017   HCT 39.5 01/10/2017   MCV 97.5 01/10/2017     PLT 316.0 01/10/2017   Lab Results  Component Value Date   NA 142 01/14/2017   K 3.9 01/14/2017   CO2 30 01/14/2017   GLUCOSE 98 01/14/2017   BUN 13 01/14/2017   CREATININE 0.93 01/14/2017   BILITOT 0.6 01/10/2017   ALKPHOS 76 01/10/2017   AST 16 01/10/2017   ALT 17 01/10/2017  PROT 7.0 01/10/2017   ALBUMIN 3.9 01/10/2017   CALCIUM 8.7 01/14/2017   GFR 78.00 01/14/2017   Lab Results  Component Value Date   CHOL 146 01/10/2017   Lab Results  Component Value Date   HDL 44.90 01/10/2017   Lab Results  Component Value Date   LDLCALC 73 01/10/2017   Lab Results  Component Value Date   TRIG 139.0 01/10/2017   Lab Results  Component Value Date   CHOLHDL 3 01/10/2017   Lab Results  Component Value Date   HGBA1C 5.4 01/18/2009         Assessment & Plan:   Problem List Items Addressed This Visit    Hyperlipidemia, mixed    Encouraged heart healthy diet, increase exercise, avoid trans fats, consider a krill oil cap daily      Morbid obesity (HCC)    Encouraged DASH diet, decrease po intake and increase exercise as tolerated. Needs 7-8 hours of sleep nightly. Avoid trans fats, eat small, frequent meals every 4-5 hours with lean proteins, complex carbs and healthy fats. Minimize simple carbs, GMO foods.      Essential hypertension    Well controlled, no changes to meds. Encouraged heart healthy diet such as the DASH diet and exercise as tolerated.       Osteoarthritis    Low back and right hip act up at times. Encouraged Lidocaine prn. Will call if needs a referral      Apnea, sleep    Has as a sleep study in the next month.          I am having Sharlyne PacasPamela E. Crickenberger maintain her ranitidine, montelukast, aspirin EC, omeprazole, amLODipine-olmesartan, and levocetirizine.  No orders of the defined types were placed in this encounter.   CMA served as Neurosurgeonscribe during this visit. History, Physical and Plan performed by medical provider. Documentation and  orders reviewed and attested to.  Danise EdgeStacey Blyth, MD

## 2017-05-17 ENCOUNTER — Other Ambulatory Visit: Payer: Self-pay | Admitting: Family Medicine

## 2017-05-22 ENCOUNTER — Ambulatory Visit: Payer: BC Managed Care – PPO | Admitting: Cardiology

## 2017-05-22 ENCOUNTER — Encounter: Payer: Self-pay | Admitting: Cardiology

## 2017-05-22 VITALS — BP 120/84 | HR 67 | Ht 65.0 in | Wt 269.0 lb

## 2017-05-22 DIAGNOSIS — G4733 Obstructive sleep apnea (adult) (pediatric): Secondary | ICD-10-CM | POA: Diagnosis not present

## 2017-05-22 DIAGNOSIS — I10 Essential (primary) hypertension: Secondary | ICD-10-CM

## 2017-05-22 DIAGNOSIS — E782 Mixed hyperlipidemia: Secondary | ICD-10-CM

## 2017-05-22 NOTE — Progress Notes (Signed)
Cardiology Office Note:    Date:  05/22/2017   ID:  Terri [name], DOB May 29, 1951, MRN [id number]  PCP:  Bradd CanaryBlyth, Stacey A, MD  Cardiologist:  Gypsy Balsamobert Hartwell Vandiver, MD    Referring MD: Bradd CanaryBlyth, Stacey A, MD   Chief Complaint  Patient presents with  . Follow-up  Doing well still did not chart exercises he yet  History of Present Illness:    Terri [name] is a 66 y.o. female with essential hypertension.  She also has some atypical chest pain likely stress test was normal.  She was also diagnosed with sleep apnea that being managed by neurology.  Overall she is doing fine denies have any chest pain tightness squeezing pressure burning chest.  We spent a lot of time talking about exercises on the regular basis and management of her weight she is determined to sign up for some program at the beginning of next year and I strongly encouraged her to do it.  Past Medical History:  Diagnosis Date  . Allergic rhinitis   . Apnea, sleep 06/28/2012   Previously used CPAP  . Atypical chest pain 01/14/2017  . Decreased visual acuity 08/05/2016  . Depression 06/19/2015  . Hip pain, right 06/28/2012  . Hypertension   . Hypokalemia 01/14/2017  . Morbid obesity (HCC) 04/11/2008   Qualifier: Diagnosis of  By: Nena JordanYoo DO, D. Amando Ishikawa   . Numbness in feet 05/06/2016  . Obesity   . Osteoarthritis 01/16/2010   Qualifier: Diagnosis of  By: Nena JordanYoo DO, D. Elizabth Palka   . Preventative health care 06/25/2015    Past Surgical History:  Procedure Laterality Date  . childbirth      Current Medications: No outpatient medications have been marked as taking for the 05/22/17 encounter (Office Visit) with Georgeanna LeaKrasowski, Terrian Sentell J, MD.     Allergies:   Sulfonamide derivatives   Social History   Socioeconomic History  . Marital status: Single    Spouse name: None  . Number of children: None  . Years of education: None  . Highest education level: None  Social Needs  . Financial resource strain: None  . Food insecurity - worry:  None  . Food insecurity - inability: None  . Transportation needs - medical: None  . Transportation needs - non-medical: None  Occupational History  . Occupation: HS music teacher- [employer]  Tobacco Use  . Smoking status: Never Smoker  . Smokeless tobacco: Never Used  Substance and Sexual Activity  . Alcohol use: Yes    Comment: social  . Drug use: No  . 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 no dietary restrictions  Other Topics Concern  . None  Social History Narrative  . None     Family History: The patient's family history includes Cancer in her father and paternal grandfather; Liver disease in her brother and mother; Mental illness in her brother; Stroke in her maternal grandmother and mother. ROS:   Please see the history of present illness.    All 14 point review of systems negative except as described per history of present illness  EKGs/Labs/Other Studies Reviewed:      Recent Labs: 01/10/2017: ALT 17; Hemoglobin 12.7; Platelets 316.0; TSH 2.18 01/14/2017: BUN 13; Creatinine, Ser 0.93; Potassium 3.9; Sodium 142  Recent Lipid Panel    Component Value Date/Time   CHOL 146 01/10/2017 1404   TRIG 139.0 01/10/2017 1404   HDL 44.90 01/10/2017 1404   CHOLHDL 3 01/10/2017 1404  VLDL 27.8 01/10/2017 1404   LDLCALC 73 01/10/2017 1404    Physical Exam:    VS:  BP 120/84 (BP Location: Right Arm, Patient Position: Sitting, Cuff Size: Large)   Pulse 67   Ht 5\' 5"  (1.651 m)   Wt 269 lb (122 kg)   SpO2 99%   BMI 44.76 kg/m     Wt Readings from Last 3 Encounters:  05/22/17 269 lb (122 kg)  04/15/17 273 lb 12.8 oz (124.2 kg)  03/31/17 273 lb (123.8 kg)     GEN:  Well nourished, well developed in no acute distress HEENT: Normal NECK: No JVD; No carotid bruits LYMPHATICS: No lymphadenopathy CARDIAC: RRR, no murmurs, no rubs, no gallops RESPIRATORY:  Clear to auscultation without rales, wheezing or rhonchi  ABDOMEN: Soft,  non-tender, non-distended MUSCULOSKELETAL:  No edema; No deformity  SKIN: Warm and dry LOWER EXTREMITIES: no swelling NEUROLOGIC:  Alert and oriented x 3 PSYCHIATRIC:  Normal affect   ASSESSMENT:    1. Essential hypertension   2. Hyperlipidemia, mixed   3. Obstructive sleep apnea syndrome    PLAN:    In order of problems listed above:  1. Show hypertension: Blood pressure well controlled continue present medications. 2. Hyperlipidemia: Actually her cholesterol is quite good.  We will continue present management. 3. Obstructive sleep apnea: She is scheduled to see neurology and is ready to have repeated sleep study.   Medication Adjustments/Labs and Tests Ordered: Current medicines are reviewed at length with the patient today.  Concerns regarding medicines are outlined above.  No orders of the defined types were placed in this encounter.  Medication changes: No orders of the defined types were placed in this encounter.   Signed, Georgeanna Leaobert J. Harshini Trent, MD, [facility] 05/22/2017 11:36 AM    [facility]

## 2017-05-22 NOTE — Patient Instructions (Signed)
Medication Instructions:  Your physician recommends that you continue on your current medications as directed. Please refer to the Current Medication list given to you today.  Labwork: None order  Testing/Procedures: None ordered  Follow-Up: Your physician recommends that you schedule a follow-up appointment in: 4 months with Dr. Bing MatterKrasowski   Any Other Special Instructions Will Be Listed Below (If Applicable).     If you need a refill on your cardiac medications before your next appointment, please call your pharmacy.

## 2017-06-04 ENCOUNTER — Institutional Professional Consult (permissible substitution): Payer: BC Managed Care – PPO | Admitting: Internal Medicine

## 2017-06-22 ENCOUNTER — Ambulatory Visit (INDEPENDENT_AMBULATORY_CARE_PROVIDER_SITE_OTHER): Payer: BC Managed Care – PPO | Admitting: Neurology

## 2017-06-22 DIAGNOSIS — G4720 Circadian rhythm sleep disorder, unspecified type: Secondary | ICD-10-CM

## 2017-06-22 DIAGNOSIS — G4733 Obstructive sleep apnea (adult) (pediatric): Secondary | ICD-10-CM | POA: Diagnosis not present

## 2017-06-22 DIAGNOSIS — R600 Localized edema: Secondary | ICD-10-CM

## 2017-06-22 DIAGNOSIS — J4520 Mild intermittent asthma, uncomplicated: Secondary | ICD-10-CM

## 2017-06-22 DIAGNOSIS — R062 Wheezing: Secondary | ICD-10-CM

## 2017-06-22 DIAGNOSIS — Z6841 Body Mass Index (BMI) 40.0 and over, adult: Secondary | ICD-10-CM

## 2017-06-22 DIAGNOSIS — R0789 Other chest pain: Secondary | ICD-10-CM

## 2017-06-26 ENCOUNTER — Other Ambulatory Visit: Payer: Self-pay | Admitting: Neurology

## 2017-06-26 DIAGNOSIS — G4719 Other hypersomnia: Secondary | ICD-10-CM

## 2017-06-26 DIAGNOSIS — Z6841 Body Mass Index (BMI) 40.0 and over, adult: Secondary | ICD-10-CM

## 2017-06-26 DIAGNOSIS — G4733 Obstructive sleep apnea (adult) (pediatric): Secondary | ICD-10-CM

## 2017-06-26 DIAGNOSIS — R0789 Other chest pain: Secondary | ICD-10-CM

## 2017-06-26 DIAGNOSIS — R600 Localized edema: Secondary | ICD-10-CM

## 2017-06-26 DIAGNOSIS — G4720 Circadian rhythm sleep disorder, unspecified type: Secondary | ICD-10-CM

## 2017-06-26 NOTE — Procedures (Signed)
PATIENT'S NAME:  Terri [name], Terri [name] DOB:      Jan 24, 1951      MR#:    [id number]     DATE OF RECORDING: 06/22/2017 REFERRING M.D.: Dr. Bing Matter (card.),         PCP: Danise Edge, MD Study Performed:   Baseline Polysomnogram HISTORY: 67 year old woman with a history of asthma, allergies, chest pain, depression, hypertension, reflux disease, osteoarthritis and morbid obesity, who was previously diagnosed with obstructive sleep apnea. She no longer has a CPAP machine. Her Epworth sleepiness score is 9 out of 24. The patient's weight 273 pounds with a height of 65 (inches), resulting in a BMI of 45.5 kg/m2. The patient's neck circumference measured 16.5 inches.  CURRENT MEDICATIONS: Amlodipine, Aspirin, Levocetirizine, Montelukast, Omeprazole and Ranitidine   PROCEDURE:  This is a multichannel digital polysomnogram utilizing the Somnostar 11.2 system.  Electrodes and sensors were applied and monitored per AASM Specifications.   EEG, EOG, Chin and Limb EMG, were sampled at 200 Hz.  ECG, Snore and Nasal Pressure, Thermal Airflow, Respiratory Effort, CPAP Flow and Pressure, Oximetry was sampled at 50 Hz. Digital video and audio were recorded.      BASELINE STUDY  Lights Out was at 21:08 and Lights On at 05:02.  Total recording time (TRT) was 474 minutes, with a total sleep time (TST) of 332.5 minutes.   The patient's sleep latency was 106.5 minutes, which is markedly delayed. REM latency was 81.5 minutes. The sleep efficiency was 70.1 %, which is reduced.     SLEEP ARCHITECTURE: WASO (Wake after sleep onset) was 35 minutes.  There were 16.5 minutes in Stage N1, 230 minutes Stage N2, 2.5 minutes Stage N3 and 83.5 minutes in Stage REM.  The percentage of Stage N1 was 5.%, Stage N2 was 69.2%, which is increased, Stage N3 was near absent at 0.8% and Stage R (REM sleep) was 25.1%, which is normal. The arousals were noted as: 23 were spontaneous, 0 were associated with PLMs, 88 were associated with respiratory  events.  Audio and video analysis did not show any abnormal or unusual movements, behaviors, phonations or vocalizations. The patient took 2 bathroom breaks. Mild to moderate snoring was noted. The EKG was in keeping with normal sinus rhythm (NSR).  RESPIRATORY ANALYSIS:  There were a total of 88 respiratory events:  23 obstructive apneas, 0 central apneas and 0 mixed apneas with a total of 23 apneas and an apnea index (AI) of 4.2 /hour. There were 65 hypopneas with a hypopnea index of 11.7 /hour. The patient also had 0 respiratory event related arousals (RERAs).      The total APNEA/HYPOPNEA INDEX (AHI) was 15.9/hour and the total RESPIRATORY DISTURBANCE INDEX was 15.9 /hour.  72 events occurred in REM sleep and 26 events in NREM. The REM AHI was 51.7 /hour, versus a non-REM AHI of 3.9. The patient spent 279.5 minutes of total sleep time in the supine position and 53 minutes in non-supine.. The supine AHI was 14.4 versus a non-supine AHI of 23.7.  OXYGEN SATURATION & C02:  The Wake baseline 02 saturation was 94%, with the lowest being 71%. Time spent below 89% saturation equaled 66 minutes.  PERIODIC LIMB MOVEMENTS: The patient had a total of 0 Periodic Limb Movements.  The Periodic Limb Movement (PLM) index was 0 and the PLM Arousal index was 0/hour.  Post-study, the patient indicated that sleep was worse than usual.   IMPRESSION:  1. Obstructive Sleep Apnea (OSA) 2. Dysfunctions associated with sleep stages  or arousal from sleep  RECOMMENDATIONS:  1. This study demonstrates moderate to severe obstructive sleep apnea, with a total AHI of 15.9/hour, REM AHI of 51.7/hour, supine AHI of 14.4/hour and O2 nadir of 71%. Treatment with positive airway pressure in the form of CPAP is recommended. This will require a full night titration study to optimize therapy. Other treatment options may include avoidance of supine sleep position along with weight loss, upper airway or jaw surgery in selected  patients or the use of an oral appliance in certain patients. ENT evaluation and/or consultation with a maxillofacial surgeon or dentist may be feasible in some instances.    2. Please note that untreated obstructive sleep apnea carries additional perioperative morbidity. Patients with significant obstructive sleep apnea should receive perioperative PAP therapy and the surgeons and particularly the anesthesiologist should be informed of the diagnosis and the severity of the sleep disordered breathing. 3. This study shows sleep fragmentation and abnormal sleep stage percentages; these are nonspecific findings and per se do not signify an intrinsic sleep disorder or a cause for the patient's sleep-related symptoms. Causes include (but are not limited to) the first night effect of the sleep study, circadian rhythm disturbances, medication effect or an underlying mood disorder or medical problem.  4. The patient should be cautioned not to drive, work at heights, or operate dangerous or heavy equipment when tired or sleepy. Review and reiteration of good sleep hygiene measures should be pursued with any patient. 5. The patient will [facility] of the test results and further management strategies. The referring provider will be notified of the test results.  I certify that I have reviewed the entire raw data recording prior to the issuance of this report in accordance with the Standards of Accreditation of the American Academy of Sleep Medicine (AASM)   Huston FoleySaima Jossue Rubenstein, MD, PhD Diplomat, American Board of Psychiatry and Neurology (Neurology and Sleep Medicine)

## 2017-06-26 NOTE — Progress Notes (Signed)
Patient referred by Dr. Bing MatterKrasowski in cardiology, seen by me on 03/31/17, diagnostic PSG on 06/22/17.    Please call and notify the patient that the recent sleep study did confirm the diagnosis of moderate to severe obstructive sleep apnea, with a total AHI of 15.9/hour, REM AHI of 51.7/hour, supine AHI of 14.4/hour and O2 nadir of 71%. I recommend treatment for this in the form of CPAP. This will require a repeat sleep study for proper titration and mask fitting. Please explain to patient and arrange for a CPAP titration study. I have placed an order in the chart. Thanks.  Huston FoleySaima Infiniti Hoefling, MD, PhD [facility])

## 2017-06-30 ENCOUNTER — Telehealth: Payer: Self-pay

## 2017-06-30 NOTE — Telephone Encounter (Signed)
I called pt. I advised pt that Dr. Frances FurbishAthar reviewed their sleep study results and found that pt does have moderate to severe osa with an O2 nadir  and recommends that pt be treated with a cpap. Dr. Frances FurbishAthar recommends that pt return for a repeat sleep study in order to properly titrate the cpap and ensure a good mask fit. Pt is agreeable to returning for a titration study. I advised pt that our sleep lab will file with pt's insurance and call pt to schedule the sleep study when we hear back from the pt's insurance regarding coverage of this sleep study. Pt verbalized understanding of results. Pt had no questions at this time but was encouraged to call back if questions arise.

## 2017-06-30 NOTE — Telephone Encounter (Signed)
-----   Message from Huston FoleySaima Athar, MD sent at 06/26/2017  8:17 AM EST ----- Patient referred by Dr. Bing MatterKrasowski in cardiology, seen by me on 03/31/17, diagnostic PSG on 06/22/17.    Please call and notify the patient that the recent sleep study did confirm the diagnosis of moderate to severe obstructive sleep apnea, with a total AHI of 15.9/hour, REM AHI of 51.7/hour, supine AHI of 14.4/hour and O2 nadir of 71%. I recommend treatment for this in the form of CPAP. This will require a repeat sleep study for proper titration and mask fitting. Please explain to patient and arrange for a CPAP titration study. I have placed an order in the chart. Thanks.  Huston FoleySaima Athar, MD, PhD [facility])

## 2017-07-30 ENCOUNTER — Encounter (INDEPENDENT_AMBULATORY_CARE_PROVIDER_SITE_OTHER): Payer: BC Managed Care – PPO

## 2017-08-08 ENCOUNTER — Ambulatory Visit (INDEPENDENT_AMBULATORY_CARE_PROVIDER_SITE_OTHER): Payer: BC Managed Care – PPO | Admitting: Neurology

## 2017-08-08 DIAGNOSIS — G4719 Other hypersomnia: Secondary | ICD-10-CM

## 2017-08-08 DIAGNOSIS — Z6841 Body Mass Index (BMI) 40.0 and over, adult: Secondary | ICD-10-CM

## 2017-08-08 DIAGNOSIS — G4733 Obstructive sleep apnea (adult) (pediatric): Secondary | ICD-10-CM | POA: Diagnosis not present

## 2017-08-08 DIAGNOSIS — R0789 Other chest pain: Secondary | ICD-10-CM

## 2017-08-08 DIAGNOSIS — G4720 Circadian rhythm sleep disorder, unspecified type: Secondary | ICD-10-CM

## 2017-08-08 DIAGNOSIS — R600 Localized edema: Secondary | ICD-10-CM

## 2017-08-11 ENCOUNTER — Ambulatory Visit (INDEPENDENT_AMBULATORY_CARE_PROVIDER_SITE_OTHER): Payer: BC Managed Care – PPO | Admitting: Family Medicine

## 2017-08-11 ENCOUNTER — Encounter (INDEPENDENT_AMBULATORY_CARE_PROVIDER_SITE_OTHER): Payer: Self-pay | Admitting: Family Medicine

## 2017-08-11 ENCOUNTER — Other Ambulatory Visit: Payer: Self-pay | Admitting: Neurology

## 2017-08-11 VITALS — BP 127/78 | HR 74 | Temp 98.5°F | Ht 64.0 in | Wt 261.0 lb

## 2017-08-11 DIAGNOSIS — I10 Essential (primary) hypertension: Secondary | ICD-10-CM

## 2017-08-11 DIAGNOSIS — R0602 Shortness of breath: Secondary | ICD-10-CM | POA: Diagnosis not present

## 2017-08-11 DIAGNOSIS — Z1331 Encounter for screening for depression: Secondary | ICD-10-CM | POA: Diagnosis not present

## 2017-08-11 DIAGNOSIS — Z6841 Body Mass Index (BMI) 40.0 and over, adult: Secondary | ICD-10-CM

## 2017-08-11 DIAGNOSIS — R5383 Other fatigue: Secondary | ICD-10-CM | POA: Diagnosis not present

## 2017-08-11 DIAGNOSIS — Z9189 Other specified personal risk factors, not elsewhere classified: Secondary | ICD-10-CM

## 2017-08-11 DIAGNOSIS — Z0289 Encounter for other administrative examinations: Secondary | ICD-10-CM

## 2017-08-11 DIAGNOSIS — G4733 Obstructive sleep apnea (adult) (pediatric): Secondary | ICD-10-CM

## 2017-08-11 NOTE — Progress Notes (Signed)
Office: [phone]  /  Fax: [phone]   Dear Dr. Abner [name],   Thank you for referring Terri [name] to our clinic. The following note includes my evaluation and treatment recommendations.  HPI:   Chief Complaint: OBESITY    Terri [name] has been referred by Terri StanleyStacey A. Abner GreenspanBlyth, MD for consultation regarding her obesity and obesity related comorbidities.    Terri [name] (MR# [id number]) is a 67 y.o. female who presents on 08/11/2017 for obesity evaluation and treatment. Current BMI is Body mass index is 44.8 kg/m.Marland Kitchen. Terri [name] has been struggling with her weight for many years and has been unsuccessful in either losing weight, maintaining weight loss, or reaching her healthy weight goal.     Terri [name] doesn't like cooking or handling food.     Terri [name] attended our information session and states she is currently in the action stage of change and ready to dedicate time achieving and maintaining a healthier weight. Terri [name] is interested in becoming our patient and working on intensive lifestyle modifications including (but not limited to) diet, exercise and weight loss.    Terri [name] states her family eats meals together she thinks her family will eat healthier with  her her desired weight loss is 76 lbs she started gaining weight in 1996 her heaviest weight ever was 273 lbs she is a picky eater and doesn't like to eat healthier foods  she has significant food cravings issues  she skips meals frequently she is frequently drinking liquids with calories she frequently eats larger portions than normal    Fatigue Terri [name] feels her energy is lower than it should be. This has worsened with weight gain and has not worsened recently. Terri [name] admits to daytime somnolence and  admits to waking up still tired. Patient had sleep study done on 06/22/17, apnea, CPAP recommended Patent has a history of symptoms of daytime fatigue. Patient generally gets 8 hours of sleep per night, and states they generally have  generally restful sleep. Snoring is present. Apneic episodes are present. Epworth Sleepiness Score is 7.  Dyspnea on exertion Terri [name] notes increasing shortness of breath with exercising and seems to be worsening over time with weight gain. She notes getting out of breath sooner with activity than she used to. This has not gotten worse recently. EKG within normal limits. Terri [name] denies orthopnea.  Hypertension Terri [name] is a 67 y.o. female with hypertension. Terri [name]'s blood pressure is controlled today. She had an echocardiogram done in 2018, it showed diastolic dysfunction. She denies chest pain, chest pressure, or headache. She is working weight loss to help control her blood pressure with the goal of decreasing her risk of heart attack and stroke.   At risk for cardiovascular disease Terri [name] is at a higher than average risk for cardiovascular disease due to obesity and hypertension. She currently denies any chest pain.  Depression Screen Terri [name]'s Food and Mood (modified PHQ-9) score was  Depression screen PHQ 2/9 08/11/2017  Decreased Interest 1  Down, Depressed, Hopeless 1  PHQ - 2 Score 2  Altered sleeping 0  Tired, decreased energy 1  Change in appetite 0  Feeling bad or failure about yourself  1  Trouble concentrating 0  Moving slowly or fidgety/restless 1  Suicidal thoughts 0  PHQ-9 Score 5  Difficult doing work/chores Not difficult at all    ALLERGIES: Allergies  Allergen Reactions  . Sulfonamide Derivatives     MEDICATIONS: Current Outpatient Medications on File Prior to Visit  Medication Sig Dispense Refill  .  amLODipine-olmesartan (AZOR) 10-40 MG tablet TAKE 1 TABLET BY MOUTH EVERY DAY 90 tablet 1  . cetirizine (ZYRTEC) 10 MG tablet Take 10 mg by mouth daily.    Dalbert Garnet POWD by Does not apply route.    . Garcinia Cambogia-Chromium 500-200 MG-MCG TABS Take 1 tablet by mouth daily.    . montelukast (SINGULAIR) 10 MG tablet Take 1 tablet (10 mg total) by mouth  at bedtime as needed. 30 tablet 5  . omeprazole (PRILOSEC) 40 MG capsule TAKE ONE CAPSULE BY MOUTH EVERY DAY 90 capsule 0  . oxybutynin (DITROPAN-XL) 10 MG 24 hr tablet Take 10 mg by mouth at bedtime.    . ranitidine (ZANTAC) 300 MG tablet TAKE 1 TABLET (300 MG TOTAL) BY MOUTH AT BEDTIME AS NEEDED FOR HEARTBURN. 30 tablet 3   No current facility-administered medications on file prior to visit.     PAST MEDICAL HISTORY: Past Medical History:  Diagnosis Date  . Allergic rhinitis   . Apnea, sleep 06/28/2012   Previously used CPAP  . Asthma   . Atypical chest pain 01/14/2017  . Back pain   . Decreased visual acuity 08/05/2016  . Depression 06/19/2015  . Environmental and seasonal allergies   . GERD (gastroesophageal reflux disease)   . Heartburn   . Hip pain, right 06/28/2012  . HTN (hypertension)   . Hypokalemia 01/14/2017  . Leg pain   . Morbid obesity (HCC) 04/11/2008   Qualifier: Diagnosis of  By: [name]   . Numbness in feet 05/06/2016  . Obesity   . Osteoarthritis 01/16/2010   Qualifier: Diagnosis of  By: [name]   . Preventative health care 06/25/2015  . Shortness of breath   . Shortness of breath on exertion   . Sleep apnea   . Swelling of extremity   . Tinnitus   . Vertigo     PAST SURGICAL HISTORY: Past Surgical History:  Procedure Laterality Date  . BIOPSY BREAST  1997   left   . childbirth      SOCIAL HISTORY: Social History   Tobacco Use  . Smoking status: Never Smoker  . Smokeless tobacco: Never Used  Substance Use Topics  . Alcohol use: Yes    Comment: social  . Drug use: No    FAMILY HISTORY: Family History  Problem Relation Age of Onset  . Stroke Mother   . Liver disease Mother        hepatitis b  . Hypertension Mother   . Cancer Father        esophagus, prostate  . Hypertension Father   . Heart disease Father   . Liver disease Brother        hep c  . Mental illness Brother        PTSD from [place]  . Stroke Maternal  Grandmother   . Cancer Paternal Grandfather        prostate    ROS: Review of Systems  Constitutional: Positive for malaise/fatigue. Negative for weight loss.  HENT: Positive for hearing loss.        + Decreased hearing  Eyes:       + Wear glasses or contacts  Respiratory: Positive for shortness of breath (with exertion).   Cardiovascular: Negative for chest pain and orthopnea.       Negative chest pressure + Calf/leg pain with walking  Gastrointestinal: Positive for heartburn.  Musculoskeletal: Positive for back pain.  Neurological: Positive for dizziness (vertigo). Negative for headaches.  Psychiatric/Behavioral: Positive for depression. Negative for suicidal ideas.    PHYSICAL EXAM: Blood pressure 127/78, pulse 74, temperature 98.5 F (36.9 C), temperature source Oral, height 5\' 4"  (1.626 m), weight 261 lb (118.4 kg), SpO2 97 %. Body mass index is 44.8 kg/m. Physical Exam  Constitutional: She is oriented to person, place, and time. She appears well-developed and well-nourished.  HENT:  Head: Normocephalic and atraumatic.  Nose: Nose normal.  Eyes: EOM are normal. No scleral icterus.  Neck: Normal range of motion. Neck supple. No thyromegaly present.  Cardiovascular: Normal rate and regular rhythm.  Pulmonary/Chest: Effort normal. No respiratory distress.  Abdominal: Soft. There is no tenderness.  + Obesity  Musculoskeletal:  Range of Motion normal in all 4 extremities Trace edema noted in bilateral lower extremities  Neurological: She is alert and oriented to person, place, and time. Coordination normal.  Skin: Skin is warm and dry.  Psychiatric: She has a normal mood and affect. Her behavior is normal.  Vitals reviewed.   RECENT LABS AND TESTS: BMET    Component Value Date/Time   NA 142 01/14/2017 1030   K 3.9 01/14/2017 1030   CL 106 01/14/2017 1030   CO2 30 01/14/2017 1030   GLUCOSE 98 01/14/2017 1030   BUN 13 01/14/2017 1030   CREATININE 0.93 01/14/2017  1030   CREATININE 0.85 12/09/2013 1453   CALCIUM 8.7 01/14/2017 1030   GFRNONAA 73.57 06/30/2009 1709   GFRAA 84 04/11/2008 0822   Lab Results  Component Value Date   HGBA1C 5.4 01/18/2009   No results found for: INSULIN CBC    Component Value Date/Time   WBC 10.3 01/10/2017 1404   RBC 4.05 01/10/2017 1404   HGB 12.7 01/10/2017 1404   HCT 39.5 01/10/2017 1404   PLT 316.0 01/10/2017 1404   MCV 97.5 01/10/2017 1404   MCH 30.9 12/09/2013 1453   MCHC 32.2 01/10/2017 1404   RDW 14.6 01/10/2017 1404   LYMPHSABS 2.3 12/21/2010 1107   MONOABS 0.8 12/21/2010 1107   EOSABS 0.3 12/21/2010 1107   BASOSABS 0.1 12/21/2010 1107   Iron/TIBC/Ferritin/ %Sat No results found for: IRON, TIBC, FERRITIN, IRONPCTSAT Lipid Panel     Component Value Date/Time   CHOL 146 01/10/2017 1404   TRIG 139.0 01/10/2017 1404   HDL 44.90 01/10/2017 1404   CHOLHDL 3 01/10/2017 1404   VLDL 27.8 01/10/2017 1404   LDLCALC 73 01/10/2017 1404   Hepatic Function Panel     Component Value Date/Time   PROT 7.0 01/10/2017 1404   ALBUMIN 3.9 01/10/2017 1404   AST 16 01/10/2017 1404   ALT 17 01/10/2017 1404   ALKPHOS 76 01/10/2017 1404   BILITOT 0.6 01/10/2017 1404   BILIDIR 0.1 12/09/2013 1453   IBILI 0.4 12/09/2013 1453      Component Value Date/Time   TSH 2.18 01/10/2017 1404   TSH 1.45 05/06/2016 1406   TSH 2.85 06/19/2015 1032    ECG  shows NSR with a rate of 73 BPM INDIRECT CALORIMETER done today shows a VO2 of 186 and a REE of 1297.  Her calculated basal metabolic rate is 1610 thus her basal metabolic rate is worse than expected.    ASSESSMENT AND PLAN: Other fatigue - Plan: EKG 12-Lead, Folate, Hemoglobin A1c, Insulin, random, T3, T4, free, TSH, Vitamin B12, VITAMIN D 25 Hydroxy (Vit-D Deficiency, Fractures)  Shortness of breath  Essential hypertension - Plan: CBC with Differential/Platelet, Comprehensive metabolic panel, Lipid panel  Depression screening  At risk for heart  disease  Class 3 severe obesity with serious comorbidity and body mass index (BMI) of 40.0 to 44.9 in adult, unspecified obesity type (HCC)  PLAN:  Fatigue Terri [name] was informed that her fatigue may be related to obesity, depression or many other causes. Labs will be ordered, and in the meanwhile Maylyn has agreed to work on diet, exercise and weight loss to help with fatigue. Proper sleep hygiene was discussed including the need for 7-8 hours of quality sleep each night. A sleep study was not ordered based on symptoms and Epworth score.  Dyspnea on exertion Terri [name]'s shortness of breath appears to be obesity related and exercise induced. She has agreed to work on weight loss and gradually increase exercise to treat her exercise induced shortness of breath. If Terri [name] follows our instructions and loses weight without improvement of her shortness of breath, we will plan to refer to pulmonology. We will monitor this condition regularly. Terri [name] agrees to this plan.  Hypertension We discussed sodium restriction, working on healthy weight loss, and a regular exercise program as the means to achieve improved blood pressure control. Terri [name] agreed with this plan and agreed to follow up as directed. We will continue to monitor her blood pressure as well as her progress with the above lifestyle modifications. Terri [name] agrees to continue taking amlodipine as prescribed, we will monitor at next visit and she will watch for signs of hypotension as she continues her lifestyle modifications. We will check labs and Terri [name] agrees to follow up with our clinic in 2 weeks.  Cardiovascular risk counselling Akirah was given extended (15 minutes) coronary artery disease prevention counseling today. She is 67 y.o. female and has risk factors for heart disease including obesity and hypertension. We discussed intensive lifestyle modifications today with an emphasis on specific weight loss instructions and strategies. Pt was also  informed of the importance of increasing exercise and decreasing saturated fats to help prevent heart disease.  Depression Screen Terri [name] had a mildly positive depression screening. Depression is commonly associated with obesity and often results in emotional eating behaviors. We will monitor this closely and work on CBT to help improve the non-hunger eating patterns. Referral to Psychology may be required if no improvement is seen as she continues in our clinic.  Obesity Terri [name] is currently in the action stage of change and her goal is to continue with weight loss efforts. I recommend Terri [name] begin the structured treatment plan as follows:  She has agreed to follow the Category 2 plan Terri [name] has been instructed to eventually work up to a goal of 150 minutes of combined cardio and strengthening exercise per week for weight loss and overall health benefits. We discussed the following Behavioral Modification Strategies today: increasing lean protein intake, decrease eating out, keeping healthy foods in the home, and planning for success   She was informed of the importance of frequent follow up visits to maximize her success with intensive lifestyle modifications for her multiple health conditions. She was informed we would discuss her lab results at her next visit unless there is a critical issue that needs to be addressed sooner. Ayani agreed to keep her next visit at the agreed upon time to discuss these results.    OBESITY BEHAVIORAL INTERVENTION VISIT  Today's visit was # 1 out of 22.  Starting weight: 261 lbs Starting date: 08/11/17 Today's weight : 261 lbs  Today's date: 08/11/2017 Total lbs lost to date: 0 (Patients must lose 7 lbs in the first 6 months to continue with counseling)  ASK: We discussed the diagnosis of obesity with Terri Pacas today and Karena agreed to give [name] permission to discuss obesity behavioral modification therapy today.  ASSESS: Clelia has the  diagnosis of obesity and her BMI today is 44.78 Ester is in the action stage of change   ADVISE: Willowdean was educated on the multiple health risks of obesity as well as the benefit of weight loss to improve her health. She was advised of the need for long term treatment and the importance of lifestyle modifications.  AGREE: Multiple dietary modification options and treatment options were discussed and  Saniya agreed to the above obesity treatment plan.   I, Burt Knack, am acting as transcriptionist for Debbra Riding, MD   I have reviewed the above documentation for accuracy and completeness, and I agree with the above. - Debbra Riding, MD

## 2017-08-11 NOTE — Progress Notes (Signed)
Patient referred by Dr. Bing MatterKrasowski in cardiology, seen by me on 03/31/17, diagnostic PSG on 06/22/17. Patient had a CPAP titration study on 08/08/17.  Please call and inform patient that I have entered an order for treatment with positive airway pressure (PAP) treatment for obstructive sleep apnea (OSA). She did well during the latest sleep study with CPAP. We will, therefore, arrange for a machine for home use through a DME (durable medical equipment) company of Her choice; and I will see the patient back in follow-up in about 10 weeks. Please also explain to the patient that I will be looking out for compliance data, which can be downloaded from the machine (stored on an SD card, that is inserted in the machine) or via remote access through a modem, that is built into the machine. At the time of the followup appointment we will discuss sleep study results and how it is going with PAP treatment at home. Please advise patient to bring Her machine at the time of the first FU visit, even though this is cumbersome. Bringing the machine for every visit after that will likely not be needed, but often helps for the first visit to troubleshoot if needed. Please re-enforce the importance of compliance with treatment and the need for us to monitor compliance data - often an insurance requirement and actually good feedback for the patient as far as how they are doing.  Also remind patient, that any interim PAP machine or mask issues should be first addressed with the DME company, as they can often help better with technical and mask fit issues. Please ask if patient has a preference regarding DME company.  Please also make sure, the patient has a follow-up appointment with me in about 10 weeks from the setup date, thanks. May see one of our nurse practitioners if needed for proper timing of the FU appointment.  Please fax or rout report to the referring provider. Thanks,   Huston FoleySaima Jarae Nemmers, MD, PhD [facility])

## 2017-08-11 NOTE — Procedures (Signed)
PATIENT'S NAME:  Terri [name], Been DOB:      1951-02-22      MR#:    [id number]     DATE OF RECORDING: 08/08/2017 REFERRING M.D.:  Danise Edge, MD Study Performed:   CPAP  Titration HISTORY: 67 year old woman with a history of asthma, allergies, chest pain, depression, hypertension, reflux disease, osteoarthritis and morbid obesity, who returns for a CPAP titration study. Her baseline sleep study from 06/22/17 showed moderate to severe obstructive sleep apnea, with a total AHI of 15.9/hour, REM AHI of 51.7/hour, supine AHI of 14.4/hour and O2 nadir of 71%. The patient endorsed the Epworth Sleepiness Scale at 9 points. The patient's weight 273 pounds with a height of 65 (inches), resulting in a BMI of 45.5 kg/m2. The patient's neck circumference measured 16.5 inches.  CURRENT MEDICATIONS: Amlodipine, Aspirin, Levocetirizine, Montelukast, Omeprazole and Ranitidine  PROCEDURE:  This is a multichannel digital polysomnogram utilizing the SomnoStar 11.2 system.  Electrodes and sensors were applied and monitored per AASM Specifications.   EEG, EOG, Chin and Limb EMG, were sampled at 200 Hz.  ECG, Snore and Nasal Pressure, Thermal Airflow, Respiratory Effort, CPAP Flow and Pressure, Oximetry was sampled at 50 Hz. Digital video and audio were recorded.      The patient was fitted with a small Simplus FFM. CPAP was initiated at 5 cmH20 with heated humidity per AASM standards and pressure was advanced to 7 cmH20 because of hypopneas, apneas and desaturations.  At a PAP pressure of 7 cmH20, there was a reduction of the AHI to 0.3/hour with supine REM sleep achieved and O2 nadir of 85%.    Lights Out was at 21:13 and Lights On at 05:01. Total recording time (TRT) was 468.5 minutes, with a total sleep time (TST) of 436 minutes. The patient's sleep latency was 5.5 minutes, REM latency was 78.5 minutes, which is normal. The sleep efficiency was 93.1 %.    SLEEP ARCHITECTURE: WASO (Wake after sleep onset) was 29.5  minutes with minimal sleep fragmentation noted and one longer period of wakefulness.  There were 16 minutes in Stage N1, 191.5 minutes Stage N2, 106 minutes Stage N3 and 122.5 minutes in Stage REM.  The percentage of Stage N1 was 3.7%, Stage N2 was 43.9%, Stage N3 was 24.3%, which is increased, and Stage R (REM sleep) was 28.1%, which is increased and in keeping with rebound. The arousals were noted as: 49 were spontaneous, 0 were associated with PLMs, 0 were associated with respiratory events.  Audio and video analysis did not show any abnormal or unusual movements, behaviors, phonations or vocalizations. The patient took 1 bathroom break. The EKG was in keeping with normal sinus rhythm (NSR).  RESPIRATORY ANALYSIS:  There was a total of 1 respiratory events: 0 obstructive apneas, 1 central apneas and 0 mixed apneas with a total of 1 apneas and an apnea index (AI) of .1 /hour. There were 0 hypopneas with a hypopnea index of 0/hour. The patient also had 0 respiratory event related arousals (RERAs).      The total APNEA/HYPOPNEA INDEX  (AHI) was .1 /hour and the total RESPIRATORY DISTURBANCE INDEX was .1 .hour  0 events occurred in REM sleep and 1 events in NREM. The REM AHI was 0 /hour versus a non-REM AHI of .2 /hour.  The patient spent 399 minutes of total sleep time in the supine position and 37 minutes in non-supine. The supine AHI was 0.2, versus a non-supine AHI of 0.0.  OXYGEN SATURATION & C02:  The baseline 02 saturation was 97%, with the lowest being 85%. Time spent below 89% saturation equaled 12 minutes.  PERIODIC LIMB MOVEMENTS: The patient had a total of 0 Periodic Limb Movements. The Periodic Limb Movement (PLM) index was 0 and the PLM Arousal index was 0 /hour.  Post-study, the patient indicated that sleep was better than usual.   IMPRESSION:   1. Obstructive Sleep Apnea (OSA)  RECOMMENDATIONS:   1. This study demonstrates improvement of the patient's obstructive sleep apnea with  CPAP therapy. Given the O2 nadir of 85% on the final titration pressure of 7 cm, I will recommmend a home CPAP treatment pressure of 8 cm via small Simplus FFM with heated humidity. The patient should be reminded to be fully compliant with PAP therapy to improve sleep related symptoms and decrease long term cardiovascular risks. The patient should be reminded, that it may take up to 3 months to get fully used to using PAP with all planned sleep. The earlier full compliance is achieved, the better long term compliance tends to be. Please note that untreated obstructive sleep apnea carries additional perioperative morbidity. Patients with significant obstructive sleep apnea should receive perioperative PAP therapy and the surgeons and particularly the anesthesiologist should be informed of the diagnosis and the severity of the sleep disordered breathing. 2. The patient should be cautioned not to drive, work at heights, or operate dangerous or heavy equipment when tired or sleepy. Review and reiteration of good sleep hygiene measures should be pursued with any patient. 3. The patient [facility] test results and further management strategies. The referring provider will be notified of the test results.   I certify that I have reviewed the entire raw data recording prior to the issuance of this report in accordance with the Standards of Accreditation of the American Academy of Sleep Medicine (AASM)     Huston FoleySaima Sharry Beining, MD, PhD Diplomat, American Board of Psychiatry and Neurology (Neurology and Sleep Medicine)

## 2017-08-12 ENCOUNTER — Telehealth: Payer: Self-pay

## 2017-08-12 LAB — COMPREHENSIVE METABOLIC PANEL
A/G RATIO: 1.3 (ref 1.2–2.2)
ALT: 16 IU/L (ref 0–32)
AST: 13 IU/L (ref 0–40)
Albumin: 4.2 g/dL (ref 3.6–4.8)
Alkaline Phosphatase: 106 IU/L (ref 39–117)
BUN/Creatinine Ratio: 15 (ref 12–28)
BUN: 13 mg/dL (ref 8–27)
Bilirubin Total: 0.4 mg/dL (ref 0.0–1.2)
CALCIUM: 9 mg/dL (ref 8.7–10.3)
CO2: 22 mmol/L (ref 20–29)
CREATININE: 0.87 mg/dL (ref 0.57–1.00)
Chloride: 107 mmol/L — ABNORMAL HIGH (ref 96–106)
GFR, EST AFRICAN AMERICAN: 81 mL/min/{1.73_m2} (ref >59)
GFR, EST NON AFRICAN AMERICAN: 71 mL/min/{1.73_m2} (ref >59)
GLOBULIN, TOTAL: 3.2 g/dL (ref 1.5–4.5)
Glucose: 95 mg/dL (ref 65–99)
POTASSIUM: 4.5 mmol/L (ref 3.5–5.2)
Sodium: 145 mmol/L — ABNORMAL HIGH (ref 134–144)
TOTAL PROTEIN: 7.4 g/dL (ref 6.0–8.5)

## 2017-08-12 LAB — CBC WITH DIFFERENTIAL/PLATELET
BASOS ABS: 0 10*3/uL (ref 0.0–0.2)
Basos: 0 %
EOS (ABSOLUTE): 0.4 10*3/uL (ref 0.0–0.4)
Eos: 4 %
Hematocrit: 40.8 % (ref 34.0–46.6)
Hemoglobin: 13.3 g/dL (ref 11.1–15.9)
IMMATURE GRANS (ABS): 0 10*3/uL (ref 0.0–0.1)
IMMATURE GRANULOCYTES: 0 %
LYMPHS: 24 %
Lymphocytes Absolute: 2.2 10*3/uL (ref 0.7–3.1)
MCH: 30.6 pg (ref 26.6–33.0)
MCHC: 32.6 g/dL (ref 31.5–35.7)
MCV: 94 fL (ref 79–97)
Monocytes Absolute: 0.8 10*3/uL (ref 0.1–0.9)
Monocytes: 9 %
NEUTROS PCT: 63 %
Neutrophils Absolute: 5.7 10*3/uL (ref 1.4–7.0)
PLATELETS: 358 10*3/uL (ref 150–379)
RBC: 4.34 x10E6/uL (ref 3.77–5.28)
RDW: 14.8 % (ref 12.3–15.4)
WBC: 9.2 10*3/uL (ref 3.4–10.8)

## 2017-08-12 LAB — LIPID PANEL
CHOLESTEROL TOTAL: 150 mg/dL (ref 100–199)
Chol/HDL Ratio: 2.9 ratio (ref 0.0–4.4)
HDL: 52 mg/dL (ref >39)
LDL Calculated: 84 mg/dL (ref 0–99)
TRIGLYCERIDES: 68 mg/dL (ref 0–149)
VLDL Cholesterol Cal: 14 mg/dL (ref 5–40)

## 2017-08-12 LAB — VITAMIN D 25 HYDROXY (VIT D DEFICIENCY, FRACTURES): Vit D, 25-Hydroxy: 6.3 ng/mL — ABNORMAL LOW (ref 30.0–100.0)

## 2017-08-12 LAB — HEMOGLOBIN A1C
ESTIMATED AVERAGE GLUCOSE: 111 mg/dL
HEMOGLOBIN A1C: 5.5 % (ref 4.8–5.6)

## 2017-08-12 LAB — TSH: TSH: 2.05 u[IU]/mL (ref 0.450–4.500)

## 2017-08-12 LAB — VITAMIN B12: VITAMIN B 12: 296 pg/mL (ref 232–1245)

## 2017-08-12 LAB — T3: T3, Total: 130 ng/dL (ref 71–180)

## 2017-08-12 LAB — FOLATE: Folate: 5.5 ng/mL (ref >3.0)

## 2017-08-12 LAB — T4, FREE: Free T4: 1.01 ng/dL (ref 0.82–1.77)

## 2017-08-12 LAB — INSULIN, RANDOM: INSULIN: 21.8 u[IU]/mL (ref 2.6–24.9)

## 2017-08-12 NOTE — Telephone Encounter (Signed)
-----   Message from Huston FoleySaima Athar, MD sent at 08/11/2017  4:53 PM EDT ----- Patient referred by Dr. Bing MatterKrasowski in cardiology, seen by me on 03/31/17, diagnostic PSG on 06/22/17. Patient had a CPAP titration study on 08/08/17.  Please call and inform patient that I have entered an order for treatment with positive airway pressure (PAP) treatment for obstructive sleep apnea (OSA). She did well during the latest sleep study with CPAP. We will, therefore, arrange for a machine for home use through a DME (durable medical equipment) company of Her choice; and I will see the patient back in follow-up in about 10 weeks. Please also explain to the patient that I will be looking out for compliance data, which can be downloaded from the machine (stored on an SD card, that is inserted in the machine) or via remote access through a modem, that is built into the machine. At the time of the followup appointment we will discuss sleep study results and how it is going with PAP treatment at home. Please advise patient to bring Her machine at the time of the first FU visit, even though this is cumbersome. Bringing the machine for every visit after that will likely not be needed, but often helps for the first visit to troubleshoot if needed. Please re-enforce the importance of compliance with treatment and the need for us to monitor compliance data - often an insurance requirement and actually good feedback for the patient as far as how they are doing.  Also remind patient, that any interim PAP machine or mask issues should be first addressed with the DME company, as they can often help better with technical and mask fit issues. Please ask if patient has a preference regarding DME company.  Please also make sure, the patient has a follow-up appointment with me in about 10 weeks from the setup date, thanks. May see one of our nurse practitioners if needed for proper timing of the FU appointment.  Please fax or rout report to the  referring provider. Thanks,   Huston FoleySaima Athar, MD, PhD [facility])

## 2017-08-12 NOTE — Telephone Encounter (Signed)
I called pt to discuss her sleep study results. No answer, left a message asking her to call me back. 

## 2017-08-12 NOTE — Telephone Encounter (Signed)
I called pt. I advised pt that Dr. Frances FurbishAthar reviewed their sleep study results and found that pt did well during the latest sleep study with the cpap. Dr. Frances FurbishAthar recommends that pt start a cpap at home. I reviewed PAP compliance expectations with the pt. Pt is agreeable to starting a CPAP. I advised pt that an order will be sent to a DME, [facility], and [facility] will call the pt within about one week after they file with the pt's insurance. [facility] will show the pt how to use the machine, fit for masks, and troubleshoot the CPAP if needed. A follow up appt was made for insurance purposes with Dr. Frances FurbishAthar on 11/25/17 at 11:30am. Pt verbalized understanding to arrive 15 minutes early and bring their CPAP. A letter with all of this information in it will be sent to the pt's mychart account as a reminder. I verified with the pt that the address we have on file is correct. Pt verbalized understanding of results. Pt had no questions at this time but was encouraged to call back if questions arise.

## 2017-08-17 ENCOUNTER — Other Ambulatory Visit: Payer: Self-pay | Admitting: Family Medicine

## 2017-08-18 ENCOUNTER — Ambulatory Visit: Payer: BC Managed Care – PPO | Admitting: Family Medicine

## 2017-08-18 ENCOUNTER — Encounter: Payer: Self-pay | Admitting: Family Medicine

## 2017-08-18 DIAGNOSIS — E782 Mixed hyperlipidemia: Secondary | ICD-10-CM

## 2017-08-18 DIAGNOSIS — I10 Essential (primary) hypertension: Secondary | ICD-10-CM

## 2017-08-18 DIAGNOSIS — K219 Gastro-esophageal reflux disease without esophagitis: Secondary | ICD-10-CM | POA: Diagnosis not present

## 2017-08-18 NOTE — Assessment & Plan Note (Signed)
Tolerating statin, encouraged heart healthy diet, avoid trans fats, minimize simple carbs and saturated fats. Increase exercise as tolerated 

## 2017-08-18 NOTE — Patient Instructions (Signed)
Shingrix is the shingles shot, 2 shots over 2-6 months at pharmacy or at office, check with insurance regarding coverage  Hypertension Hypertension is another name for high blood pressure. High blood pressure forces your heart to work harder to pump blood. This can cause problems over time. There are two numbers in a blood pressure reading. There is a top number (systolic) over a bottom number (diastolic).    It is best to have a blood pressure below 120/80. Healthy choices can help lower your blood pressure. You may need medicine to help lower your blood pressure if:  Your blood pressure cannot be lowered with healthy choices.  Your blood pressure is higher than 130/80.  Follow these instructions at home: Eating and drinking  If directed, follow the DASH eating plan. This diet includes: ? Filling half of your plate at each meal with fruits and vegetables. ? Filling one quarter of your plate at each meal with whole grains. Whole grains include whole wheat pasta, brown rice, and whole grain bread. ? Eating or drinking low-fat dairy products, such as skim milk or low-fat yogurt. ? Filling one quarter of your plate at each meal with low-fat (lean) proteins. Low-fat proteins include fish, skinless chicken, eggs, beans, and tofu. ? Avoiding fatty meat, cured and processed meat, or chicken with skin. ? Avoiding premade or processed food.  Eat less than 1,500 mg of salt (sodium) a day.  Limit alcohol use to no more than 1 drink a day for nonpregnant women and 2 drinks a day for men. One drink equals 12 oz of beer, 5 oz of wine, or 1 oz of hard liquor. Lifestyle  Work with your doctor to stay at a healthy weight or to lose weight. Ask your doctor what the best weight is for you.  Get at least 30 minutes of exercise that causes your heart to beat faster (aerobic exercise) most days of the week. This may include walking, swimming, or biking.  Get at least 30 minutes of exercise that  strengthens your muscles (resistance exercise) at least 3 days a week. This may include lifting weights or pilates.  Do not use any products that contain nicotine or tobacco. This includes cigarettes and e-cigarettes. If you need help quitting, ask your doctor.  Check your blood pressure at home as told by your doctor.  Keep all follow-up visits as told by your doctor. This is important. Medicines  Take over-the-counter and prescription medicines only as told by your doctor. Follow directions carefully.  Do not skip doses of blood pressure medicine. The medicine does not work as well if you skip doses. Skipping doses also puts you at risk for problems.  Ask your doctor about side effects or reactions to medicines that you should watch for. Contact a doctor if:  You think you are having a reaction to the medicine you are taking.  You have headaches that keep coming back (recurring).  You feel dizzy.  You have swelling in your ankles.  You have trouble with your vision. Get help right away if:  You get a very bad headache.  You start to feel confused.  You feel weak or numb.  You feel faint.  You get very bad pain in your: ? Chest. ? Belly (abdomen).  You throw up (vomit) more than once.  You have trouble breathing. Summary  Hypertension is another name for high blood pressure.  Making healthy choices can help lower blood pressure. If your blood pressure cannot be controlled  with healthy choices, you may need to take medicine. This information is not intended to replace advice given to you by your health care provider. Make sure you discuss any questions you have with your health care provider. Document Released: 11/06/2007 Document Revised: 04/17/2016 Document Reviewed: 04/17/2016 Elsevier Interactive Patient Education  [employer].

## 2017-08-18 NOTE — Assessment & Plan Note (Signed)
Well controlled, no changes to meds. Encouraged heart healthy diet such as the DASH diet and exercise as tolerated.  °

## 2017-08-18 NOTE — Assessment & Plan Note (Signed)
Avoid offending foods, start probiotics. Do not eat large meals in late evening and consider raising head of bed.  

## 2017-08-18 NOTE — Assessment & Plan Note (Addendum)
Encouraged DASH diet, decrease po intake and increase exercise as tolerated. Needs 7-8 hours of sleep nightly. Avoid trans fats, eat small, frequent meals every 4-5 hours with lean proteins, complex carbs and healthy fats. Minimize simple carbs. Has started with bariatric program still struggling but wants to keep trying. Struggles with getting dinner early enough

## 2017-08-18 NOTE — Progress Notes (Signed)
Subjective:  I acted as a Neurosurgeon for Dr. Abner Greenspan. [name], [name]  [name], female    DOB: July 13, 1950, 67 y.o.   MRN: [id number]  No chief complaint on file.   HPI  Patient is in today for a 4 month follow up and doing well. She is hoping to retired 2020. 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 No polyuria or polydipsia. Has established with bariatric program but she is struggling with eating early enough. Is trying to maintain a heart healthy diet. Denies CP/palp/SOB/HA/congestion/fevers/GI or GU c/o. Taking meds as prescribed  Patient Care Team: Bradd Canary, MD as PCP - General (Family Medicine)   Past Medical History:  Diagnosis Date  . Allergic rhinitis   . Apnea, sleep 06/28/2012   Previously used CPAP  . Asthma   . Atypical chest pain 01/14/2017  . Back pain   . Decreased visual acuity 08/05/2016  . Depression 06/19/2015  . Environmental and seasonal allergies   . GERD (gastroesophageal reflux disease)   . Heartburn   . Hip pain, right 06/28/2012  . HTN (hypertension)   . Hypokalemia 01/14/2017  . Leg pain   . Morbid obesity (HCC) 04/11/2008   Qualifier: Diagnosis of  By: [name]   . Numbness in feet 05/06/2016  . Obesity   . Osteoarthritis 01/16/2010   Qualifier: Diagnosis of  By: [name]   . Preventative health care 06/25/2015  . Shortness of breath   . Shortness of breath on exertion   . Sleep apnea   . Swelling of extremity   . Tinnitus   . Vertigo     Past Surgical History:  Procedure Laterality Date  . BIOPSY BREAST  1997   left   . childbirth      Family History  Problem Relation Age of Onset  . Stroke Mother   . Liver disease Mother        hepatitis b  . Hypertension Mother   . Cancer Father        esophagus, prostate  . Hypertension Father   . Heart disease Father   . Liver disease Brother        hep c  . Mental illness Brother        PTSD from [place]  . Stroke Maternal Grandmother   . Cancer  Paternal Grandfather        prostate    Social History   Socioeconomic History  . Marital status: Divorced    Spouse name: Not on file  . Number of children: 1  . Years of education: Not on file  . Highest education level: Not on file  Social Needs  . Financial resource strain: Not on file  . Food insecurity - worry: Not on file  . Food insecurity - inability: Not on file  . Transportation needs - medical: Not on file  . Transportation needs - non-medical: Not on file  Occupational History  . Occupation: HS music teacher- Location manager  Tobacco Use  . Smoking status: Never Smoker  . Smokeless tobacco: Never Used  Substance and Sexual Activity  . Alcohol use: Yes    Comment: social  . Drug use: No  . 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 no dietary restrictions  Other Topics Concern  . Not on file  Social History Narrative  . Not on file    Outpatient Medications Prior to Visit  Medication Sig Dispense Refill  . amLODipine-olmesartan (AZOR) 10-40 MG tablet TAKE 1 TABLET BY MOUTH EVERY DAY 90 tablet 1  . cetirizine (ZYRTEC) 10 MG tablet Take 10 mg by mouth daily.    Dalbert Garnet. Forskolin POWD by Does not apply route.    . Garcinia Cambogia-Chromium 500-200 MG-MCG TABS Take 1 tablet by mouth daily.    . montelukast (SINGULAIR) 10 MG tablet Take 1 tablet (10 mg total) by mouth at bedtime as needed. 30 tablet 5  . omeprazole (PRILOSEC) 40 MG capsule TAKE ONE CAPSULE BY MOUTH EVERY DAY 90 capsule 0  . oxybutynin (DITROPAN-XL) 10 MG 24 hr tablet Take 10 mg by mouth at bedtime.    . ranitidine (ZANTAC) 300 MG tablet TAKE 1 TABLET (300 MG TOTAL) BY MOUTH AT BEDTIME AS NEEDED FOR HEARTBURN. 30 tablet 3   No facility-administered medications prior to visit.     Allergies  Allergen Reactions  . Sulfonamide Derivatives     Review of Systems  Constitutional: Negative for fever and malaise/fatigue.  HENT: Negative for congestion.   Eyes: Negative for  blurred vision.  Respiratory: Negative for shortness of breath.   Cardiovascular: Negative for chest pain, palpitations and leg swelling.  Gastrointestinal: Negative for abdominal pain, blood in stool and nausea.  Genitourinary: Negative for dysuria and frequency.  Musculoskeletal: Negative for falls.  Skin: Negative for rash.  Neurological: Negative for dizziness, loss of consciousness and headaches.  Endo/Heme/Allergies: Negative for environmental allergies.  Psychiatric/Behavioral: Negative for depression. The patient is not nervous/anxious.        Objective:    Physical Exam  Constitutional: She is oriented to person, place, and time. She appears well-developed and well-nourished. No distress.  HENT:  Head: Normocephalic and atraumatic.  Nose: Nose normal.  Eyes: Right eye exhibits no discharge. Left eye exhibits no discharge.  Neck: Normal range of motion. Neck supple.  Cardiovascular: Normal rate and regular rhythm.  No murmur heard. Pulmonary/Chest: Effort normal and breath sounds normal.  Abdominal: Soft. Bowel sounds are normal. There is no tenderness.  Musculoskeletal: She exhibits no edema.  Neurological: She is alert and oriented to person, place, and time.  Skin: Skin is warm and dry.  Psychiatric: She has a normal mood and affect.  Nursing note and vitals reviewed.   BP 130/82 (BP Location: Left Arm, Patient Position: Sitting, Cuff Size: Normal)   Pulse 70   Temp 97.7 F (36.5 C) (Oral)   Resp 18   Wt 266 lb 9.6 oz (120.9 kg)   SpO2 96%   BMI 45.76 kg/m  Wt Readings from Last 3 Encounters:  08/18/17 266 lb 9.6 oz (120.9 kg)  08/11/17 261 lb (118.4 kg)  05/22/17 269 lb (122 kg)   BP Readings from Last 3 Encounters:  08/18/17 130/82  08/11/17 127/78  05/22/17 120/84     Immunization History  Administered Date(s) Administered  . Td 11/29/2008    Health Maintenance  Topic Date Due  . INFLUENZA VACCINE  01/01/2017  . COLONOSCOPY  11/04/2017  .  TETANUS/TDAP  11/30/2018  . PAP SMEAR  02/26/2019  . MAMMOGRAM  04/01/2019  . Hepatitis C Screening  Completed  . HIV Screening  Addressed    Lab Results  Component Value Date   WBC 9.2 08/11/2017   HGB 13.3 08/11/2017   HCT 40.8 08/11/2017   PLT 358 08/11/2017   GLUCOSE 95 08/11/2017   CHOL 150 08/11/2017   TRIG 68 08/11/2017   HDL 52 08/11/2017   LDLCALC  84 08/11/2017   ALT 16 08/11/2017   AST 13 08/11/2017   NA 145 (H) 08/11/2017   K 4.5 08/11/2017   CL 107 (H) 08/11/2017   CREATININE 0.87 08/11/2017   BUN 13 08/11/2017   CO2 22 08/11/2017   TSH 2.050 08/11/2017   HGBA1C 5.5 08/11/2017    Lab Results  Component Value Date   TSH 2.050 08/11/2017   Lab Results  Component Value Date   WBC 9.2 08/11/2017   HGB 13.3 08/11/2017   HCT 40.8 08/11/2017   MCV 94 08/11/2017   PLT 358 08/11/2017   Lab Results  Component Value Date   NA 145 (H) 08/11/2017   K 4.5 08/11/2017   CO2 22 08/11/2017   GLUCOSE 95 08/11/2017   BUN 13 08/11/2017   CREATININE 0.87 08/11/2017   BILITOT 0.4 08/11/2017   ALKPHOS 106 08/11/2017   AST 13 08/11/2017   ALT 16 08/11/2017   PROT 7.4 08/11/2017   ALBUMIN 4.2 08/11/2017   CALCIUM 9.0 08/11/2017   GFR 78.00 01/14/2017   Lab Results  Component Value Date   CHOL 150 08/11/2017   Lab Results  Component Value Date   HDL 52 08/11/2017   Lab Results  Component Value Date   LDLCALC 84 08/11/2017   Lab Results  Component Value Date   TRIG 68 08/11/2017   Lab Results  Component Value Date   CHOLHDL 2.9 08/11/2017   Lab Results  Component Value Date   HGBA1C 5.5 08/11/2017         Assessment & Plan:   Problem List Items Addressed This Visit    Hyperlipidemia, mixed    Tolerating statin, encouraged heart healthy diet, avoid trans fats, minimize simple carbs and saturated fats. Increase exercise as tolerated      Morbid obesity (HCC)    Encouraged DASH diet, decrease po intake and increase exercise as tolerated.  Needs 7-8 hours of sleep nightly. Avoid trans fats, eat small, frequent meals every 4-5 hours with lean proteins, complex carbs and healthy fats. Minimize simple carbs. Has started with bariatric program still struggling but wants to keep trying. Struggles with getting dinner early enough      Essential hypertension    Well controlled, no changes to meds. Encouraged heart healthy diet such as the DASH diet and exercise as tolerated.       GERD    Avoid offending foods, start probiotics. Do not eat large meals in late evening and consider raising head of bed.          I am having [name] maintain her ranitidine, montelukast, amLODipine-olmesartan, omeprazole, oxybutynin, cetirizine, Forskolin, and Garcinia Cambogia-Chromium.  No orders of the defined types were placed in this encounter.   CMA served as Neurosurgeon during this visit. History, Physical and Plan performed by medical provider. Documentation and orders reviewed and attested to.  Danise Edge, MD

## 2017-08-25 ENCOUNTER — Ambulatory Visit (INDEPENDENT_AMBULATORY_CARE_PROVIDER_SITE_OTHER): Payer: BC Managed Care – PPO | Admitting: Family Medicine

## 2017-08-25 VITALS — BP 115/73 | HR 64 | Temp 98.4°F | Ht 64.0 in | Wt 259.0 lb

## 2017-08-25 DIAGNOSIS — Z9189 Other specified personal risk factors, not elsewhere classified: Secondary | ICD-10-CM | POA: Diagnosis not present

## 2017-08-25 DIAGNOSIS — E88819 Insulin resistance, unspecified: Secondary | ICD-10-CM | POA: Insufficient documentation

## 2017-08-25 DIAGNOSIS — E8881 Metabolic syndrome: Secondary | ICD-10-CM

## 2017-08-25 DIAGNOSIS — Z6841 Body Mass Index (BMI) 40.0 and over, adult: Secondary | ICD-10-CM | POA: Diagnosis not present

## 2017-08-25 DIAGNOSIS — E559 Vitamin D deficiency, unspecified: Secondary | ICD-10-CM

## 2017-08-25 MED ORDER — VITAMIN D (ERGOCALCIFEROL) 1.25 MG (50000 UNIT) PO CAPS: 50000.0000 [IU] | ORAL_CAPSULE | ORAL | 0 refills | Status: DC

## 2017-08-25 NOTE — Progress Notes (Signed)
Office: [phone]  /  Fax: [phone]   HPI:   Chief Complaint: OBESITY Terri [name] is here to discuss her progress with her obesity treatment plan. She is on the Category 2 plan and is following her eating plan approximately 10 % of the time. She states she is exercising 0 minutes 0 times per week. Terri [name] has difficulty with preparing dinner portion of meat, wants to switch timing of dinner and which times. She questions whether she is in action stage of plan.  Her weight is 259 lb (117.5 kg) today and has had a weight loss of 2 pounds over a period of 2 weeks since her last visit. She has lost 2 lbs since starting treatment with [name].  Vitamin D Deficiency Terri [name] has a diagnosis of vitamin D deficiency. She is not on Vit D supplement currently and she denies nausea, vomiting or muscle weakness.  At risk for osteopenia and osteoporosis Terri [name] is at higher risk of osteopenia and osteoporosis due to vitamin D deficiency.   Insulin Resistance Terri [name] has a diagnosis of insulin resistance based on her elevated fasting insulin level >5. Hgb A1c of 5.5 and insulin of 21.8. Although Terri [name]'s blood glucose readings are still under good control, insulin resistance puts her at greater risk of metabolic syndrome and diabetes. She is not taking metformin currently and continues to work on diet and exercise to decrease risk of diabetes.  ALLERGIES: Allergies  Allergen Reactions  . Sulfonamide Derivatives     MEDICATIONS: Current Outpatient Medications on File Prior to Visit  Medication Sig Dispense Refill  . amLODipine-olmesartan (AZOR) 10-40 MG tablet TAKE 1 TABLET BY MOUTH EVERY DAY 90 tablet 1  . cetirizine (ZYRTEC) 10 MG tablet Take 10 mg by mouth daily.    . montelukast (SINGULAIR) 10 MG tablet Take 1 tablet (10 mg total) by mouth at bedtime as needed. 30 tablet 5  . omeprazole (PRILOSEC) 40 MG capsule TAKE ONE CAPSULE BY MOUTH EVERY DAY 90 capsule 0  . oxybutynin (DITROPAN-XL) 10 MG 24 hr  tablet Take 10 mg by mouth at bedtime.    . ranitidine (ZANTAC) 300 MG tablet TAKE 1 TABLET (300 MG TOTAL) BY MOUTH AT BEDTIME AS NEEDED FOR HEARTBURN. 30 tablet 3   No current facility-administered medications on file prior to visit.     PAST MEDICAL HISTORY: Past Medical History:  Diagnosis Date  . Allergic rhinitis   . Apnea, sleep 06/28/2012   Previously used CPAP  . Asthma   . Atypical chest pain 01/14/2017  . Back pain   . Decreased visual acuity 08/05/2016  . Depression 06/19/2015  . Environmental and seasonal allergies   . GERD (gastroesophageal reflux disease)   . Heartburn   . Hip pain, right 06/28/2012  . HTN (hypertension)   . Hypokalemia 01/14/2017  . Leg pain   . Morbid obesity (HCC) 04/11/2008   Qualifier: Diagnosis of  By: [name]   . Numbness in feet 05/06/2016  . Obesity   . Osteoarthritis 01/16/2010   Qualifier: Diagnosis of  By: [name]   . Preventative health care 06/25/2015  . Shortness of breath   . Shortness of breath on exertion   . Sleep apnea   . Swelling of extremity   . Tinnitus   . Vertigo     PAST SURGICAL HISTORY: Past Surgical History:  Procedure Laterality Date  . BIOPSY BREAST  1997   left   . childbirth      SOCIAL  HISTORY: Social History   Tobacco Use  . Smoking status: Never Smoker  . Smokeless tobacco: Never Used  Substance Use Topics  . Alcohol use: Yes    Comment: social  . Drug use: No    FAMILY HISTORY: Family History  Problem Relation Age of Onset  . Stroke Mother   . Liver disease Mother        hepatitis b  . Hypertension Mother   . Cancer Father        esophagus, prostate  . Hypertension Father   . Heart disease Father   . Liver disease Brother        hep c  . Mental illness Brother        PTSD from [place]  . Stroke Maternal Grandmother   . Cancer Paternal Grandfather        prostate    ROS: Review of Systems  Constitutional: Positive for weight loss.  Gastrointestinal: Negative  for nausea and vomiting.  Musculoskeletal:       Negative muscle weakness    PHYSICAL EXAM: Blood pressure 115/73, pulse 64, temperature 98.4 F (36.9 C), temperature source Oral, height 5\' 4"  (1.626 m), weight 259 lb (117.5 kg), SpO2 98 %. Body mass index is 44.46 kg/m. Physical Exam  Constitutional: She is oriented to person, place, and time. She appears well-developed and well-nourished.  Cardiovascular: Normal rate.  Pulmonary/Chest: Effort normal.  Musculoskeletal: Normal range of motion.  Neurological: She is oriented to person, place, and time.  Skin: Skin is warm and dry.  Psychiatric: She has a normal mood and affect. Her behavior is normal.  Vitals reviewed.   RECENT LABS AND TESTS: BMET    Component Value Date/Time   NA 145 (H) 08/11/2017 1155   K 4.5 08/11/2017 1155   CL 107 (H) 08/11/2017 1155   CO2 22 08/11/2017 1155   GLUCOSE 95 08/11/2017 1155   GLUCOSE 98 01/14/2017 1030   BUN 13 08/11/2017 1155   CREATININE 0.87 08/11/2017 1155   CREATININE 0.85 12/09/2013 1453   CALCIUM 9.0 08/11/2017 1155   GFRNONAA 71 08/11/2017 1155   GFRAA 81 08/11/2017 1155   Lab Results  Component Value Date   HGBA1C 5.5 08/11/2017   HGBA1C 5.4 01/18/2009   HGBA1C 5.5 04/11/2008   Lab Results  Component Value Date   INSULIN 21.8 08/11/2017   CBC    Component Value Date/Time   WBC 9.2 08/11/2017 1155   WBC 10.3 01/10/2017 1404   RBC 4.34 08/11/2017 1155   RBC 4.05 01/10/2017 1404   HGB 13.3 08/11/2017 1155   HCT 40.8 08/11/2017 1155   PLT 358 08/11/2017 1155   MCV 94 08/11/2017 1155   MCH 30.6 08/11/2017 1155   MCH 30.9 12/09/2013 1453   MCHC 32.6 08/11/2017 1155   MCHC 32.2 01/10/2017 1404   RDW 14.8 08/11/2017 1155   LYMPHSABS 2.2 08/11/2017 1155   MONOABS 0.8 12/21/2010 1107   EOSABS 0.4 08/11/2017 1155   BASOSABS 0.0 08/11/2017 1155   Iron/TIBC/Ferritin/ %Sat No results found for: IRON, TIBC, FERRITIN, IRONPCTSAT Lipid Panel     Component Value  Date/Time   CHOL 150 08/11/2017 1155   TRIG 68 08/11/2017 1155   HDL 52 08/11/2017 1155   CHOLHDL 2.9 08/11/2017 1155   CHOLHDL 3 01/10/2017 1404   VLDL 27.8 01/10/2017 1404   LDLCALC 84 08/11/2017 1155   Hepatic Function Panel     Component Value Date/Time   PROT 7.4 08/11/2017 1155   ALBUMIN 4.2  08/11/2017 1155   AST 13 08/11/2017 1155   ALT 16 08/11/2017 1155   ALKPHOS 106 08/11/2017 1155   BILITOT 0.4 08/11/2017 1155   BILIDIR 0.1 12/09/2013 1453   IBILI 0.4 12/09/2013 1453      Component Value Date/Time   TSH 2.050 08/11/2017 1155   TSH 2.18 01/10/2017 1404   TSH 1.45 05/06/2016 1406  Results for Terri PacasCARLSON, Korrine E (MRN [id number]) as of 08/25/2017 17:30  Ref. Range 08/11/2017 11:55  Vitamin D, 25-Hydroxy Latest Ref Range: 30.0 - 100.0 ng/mL 6.3 (L)    ASSESSMENT AND PLAN: Vitamin D deficiency - Plan: Vitamin D, Ergocalciferol, (DRISDOL) 50000 units CAPS capsule  Insulin resistance  At risk for osteoporosis  Class 3 severe obesity with serious comorbidity and body mass index (BMI) of 40.0 to 44.9 in adult, unspecified obesity type (HCC)  PLAN:  Vitamin D Deficiency Terri [name] was informed that low vitamin D levels contributes to fatigue and are associated with obesity, breast, and colon cancer. Terri [name] agrees to start prescription Vit D @50 ,000 IU every week #4 with no refills. She will follow up for routine testing of vitamin D, at least 2-3 times per year. She was informed of the risk of over-replacement of vitamin D and agrees to not increase her dose unless she discusses this with us first. Terri [name] agrees to follow up with our clinic in 2 weeks.  At risk for osteopenia and osteoporosis Terri [name] is at risk for osteopenia and osteoporsis due to her vitamin D deficiency. She was encouraged to take her vitamin D and follow her higher calcium diet and increase strengthening exercise to help strengthen her bones and decrease her risk of osteopenia and osteoporosis.  Insulin  Resistance Terri [name] will continue Category 2 meal plan, and continue to work on weight loss, exercise, and decreasing simple carbohydrates in her diet to help decrease the risk of diabetes. We dicussed metformin including benefits and risks. She was informed that eating too many simple carbohydrates or too many calories at one sitting increases the likelihood of GI side effects. Terri [name] declined metformin for now and prescription was not written today. We will recheck labs in 3 months and Terri [name] agrees to follow up with our clinic in 2 weeks as directed to monitor her progress.  Obesity Terri [name] is currently in the action stage of change. As such, her goal is to continue with weight loss efforts She has agreed to follow the Category 2 plan Terri [name] has been instructed to work up to a goal of 150 minutes of combined cardio and strengthening exercise per week for weight loss and overall health benefits. We discussed the following Behavioral Modification Strategies today: work on meal planning and easy cooking plans, no skipping meals, and planning for success   Terri [name] has agreed to follow up with our clinic in 2 weeks. She was informed of the importance of frequent follow up visits to maximize her success with intensive lifestyle modifications for her multiple health conditions.   OBESITY BEHAVIORAL INTERVENTION VISIT  Today's visit was # 2 out of 22.  Starting weight: 261 lbs Starting date: 08/11/17 Today's weight : 259 lbs Today's date: 08/25/2017 Total lbs lost to date: 2 (Patients must lose 7 lbs in the first 6 months to continue with counseling)   ASK: We discussed the diagnosis of obesity with Terri [name] today and Terri [name] agreed to give us permission to discuss obesity behavioral modification therapy today.  ASSESS: Terri [name] has the diagnosis of obesity and her BMI today is  44.44 Roniyah is in the action stage of change   ADVISE: Kynisha was educated on the multiple health risks of  obesity as well as the benefit of weight loss to improve her health. She was advised of the need for long term treatment and the importance of lifestyle modifications.  AGREE: Multiple dietary modification options and treatment options were discussed and  Jasime agreed to the above obesity treatment plan.  I, Burt Knack, am acting as transcriptionist for Debbra Riding, MD  I have reviewed the above documentation for accuracy and completeness, and I agree with the above. - Debbra Riding, MD

## 2017-09-18 ENCOUNTER — Ambulatory Visit (INDEPENDENT_AMBULATORY_CARE_PROVIDER_SITE_OTHER): Payer: BC Managed Care – PPO | Admitting: Family Medicine

## 2017-09-23 ENCOUNTER — Ambulatory Visit (INDEPENDENT_AMBULATORY_CARE_PROVIDER_SITE_OTHER): Payer: BC Managed Care – PPO | Admitting: Family Medicine

## 2017-09-23 VITALS — BP 114/72 | HR 63 | Temp 97.7°F | Ht 64.0 in | Wt 256.0 lb

## 2017-09-23 DIAGNOSIS — Z9189 Other specified personal risk factors, not elsewhere classified: Secondary | ICD-10-CM | POA: Diagnosis not present

## 2017-09-23 DIAGNOSIS — Z6841 Body Mass Index (BMI) 40.0 and over, adult: Secondary | ICD-10-CM | POA: Diagnosis not present

## 2017-09-23 DIAGNOSIS — I10 Essential (primary) hypertension: Secondary | ICD-10-CM | POA: Diagnosis not present

## 2017-09-23 DIAGNOSIS — E88819 Insulin resistance, unspecified: Secondary | ICD-10-CM

## 2017-09-23 DIAGNOSIS — E8881 Metabolic syndrome: Secondary | ICD-10-CM | POA: Diagnosis not present

## 2017-09-23 DIAGNOSIS — E559 Vitamin D deficiency, unspecified: Secondary | ICD-10-CM | POA: Diagnosis not present

## 2017-09-23 MED ORDER — VITAMIN D (ERGOCALCIFEROL) 1.25 MG (50000 UNIT) PO CAPS: 50000.0000 [IU] | ORAL_CAPSULE | ORAL | 0 refills | Status: DC

## 2017-09-23 NOTE — Telephone Encounter (Signed)
Received this notice from [employer]: "I called on 08/21/2017 to go over financials and scheduling, no answer LVM. On 08/25/2017 I called pt to follow up, no answer LVM. Pt returned my call on 08/25/2017 and spoke with Ladona Ridgelaylor about financials and used bundle options, she advised she would need to think about this. On 09/01/2017 I called to patient to follow up, no answer LVM. I am voiding the sales order but can recreate if the patient calls back."

## 2017-09-25 NOTE — Progress Notes (Signed)
Office: [phone]  /  Fax: [phone]   HPI:   Chief Complaint: OBESITY Terri [name] is here to discuss her progress with her obesity treatment plan. She is on the Category 2 plan and is following her eating plan approximately 60 % of the time. She states she is exercising 0 minutes 0 times per week. Terri [name] is doing really well for breakfast and lunch and is struggling with dinner. She is still going out for dinner. Her weight is 256 lb (116.1 kg) today and has had a weight loss of 3 pounds over a period of 4 weeks since her last visit. She has lost 5 lbs since starting treatment with [name].  Vitamin D deficiency Terri [name] has a diagnosis of vitamin D deficiency. She is currently taking vit D and denies nausea, vomiting or muscle weakness.  At risk for osteopenia and osteoporosis Terri [name] is at higher risk of osteopenia and osteoporosis due to vitamin D deficiency.   Hypertension Terri [name] is a 67 y.o. female with hypertension. Terri [name] denies chest pain, chest pressure, headache, dizziness or lightheadedness. She is working weight loss to help control her blood pressure with the goal of decreasing her risk of heart attack and stroke. Terri [name] blood pressure is currently controlled.  ALLERGIES: Allergies  Allergen Reactions  . Sulfonamide Derivatives     MEDICATIONS: Current Outpatient Medications on File Prior to Visit  Medication Sig Dispense Refill  . amLODipine-olmesartan (AZOR) 10-40 MG tablet TAKE 1 TABLET BY MOUTH EVERY DAY 90 tablet 1  . levocetirizine (XYZAL) 5 MG tablet Take 5 mg by mouth every evening.    . montelukast (SINGULAIR) 10 MG tablet Take 1 tablet (10 mg total) by mouth at bedtime as needed. 30 tablet 5  . omeprazole (PRILOSEC) 40 MG capsule TAKE ONE CAPSULE BY MOUTH EVERY DAY 90 capsule 0  . oxybutynin (DITROPAN-XL) 10 MG 24 hr tablet Take 10 mg by mouth at bedtime.    . ranitidine (ZANTAC) 300 MG tablet TAKE 1 TABLET (300 MG TOTAL) BY MOUTH AT BEDTIME  AS NEEDED FOR HEARTBURN. 30 tablet 3   No current facility-administered medications on file prior to visit.     PAST MEDICAL HISTORY: Past Medical History:  Diagnosis Date  . Allergic rhinitis   . Apnea, sleep 06/28/2012   Previously used CPAP  . Asthma   . Atypical chest pain 01/14/2017  . Back pain   . Decreased visual acuity 08/05/2016  . Depression 06/19/2015  . Environmental and seasonal allergies   . GERD (gastroesophageal reflux disease)   . Heartburn   . Hip pain, right 06/28/2012  . HTN (hypertension)   . Hypokalemia 01/14/2017  . Leg pain   . Morbid obesity (HCC) 04/11/2008   Qualifier: Diagnosis of  By: [name]   . Numbness in feet 05/06/2016  . Obesity   . Osteoarthritis 01/16/2010   Qualifier: Diagnosis of  By: [name]   . Preventative health care 06/25/2015  . Shortness of breath   . Shortness of breath on exertion   . Sleep apnea   . Swelling of extremity   . Tinnitus   . Vertigo     PAST SURGICAL HISTORY: Past Surgical History:  Procedure Laterality Date  . BIOPSY BREAST  1997   left   . childbirth      SOCIAL HISTORY: Social History   Tobacco Use  . Smoking status: Never Smoker  . Smokeless tobacco: Never Used  Substance Use Topics  .  Alcohol use: Yes    Comment: social  . Drug use: No    FAMILY HISTORY: Family History  Problem Relation Age of Onset  . Stroke Mother   . Liver disease Mother        hepatitis b  . Hypertension Mother   . Cancer Father        esophagus, prostate  . Hypertension Father   . Heart disease Father   . Liver disease Brother        hep c  . Mental illness Brother        PTSD from [place]  . Stroke Maternal Grandmother   . Cancer Paternal Grandfather        prostate    ROS: Review of Systems  Constitutional: Positive for weight loss.  Cardiovascular: Negative for chest pain.       Negative for chest pressure  Gastrointestinal: Negative for nausea and vomiting.  Musculoskeletal:        Negative for muscle weakness  Neurological: Negative for dizziness and headaches.       Negative for lightheadedness    PHYSICAL EXAM: Blood pressure 114/72, pulse 63, temperature 97.7 F (36.5 C), temperature source Oral, height 5\' 4"  (1.626 m), weight 256 lb (116.1 kg), SpO2 99 %. Body mass index is 43.94 kg/m. Physical Exam  Constitutional: She is oriented to person, place, and time. She appears well-developed and well-nourished.  Cardiovascular: Normal rate.  Pulmonary/Chest: Effort normal.  Musculoskeletal: Normal range of motion.  Neurological: She is oriented to person, place, and time.  Skin: Skin is warm and dry.  Psychiatric: She has a normal mood and affect. Her behavior is normal.  Vitals reviewed.   RECENT LABS AND TESTS: BMET    Component Value Date/Time   NA 145 (H) 08/11/2017 1155   K 4.5 08/11/2017 1155   CL 107 (H) 08/11/2017 1155   CO2 22 08/11/2017 1155   GLUCOSE 95 08/11/2017 1155   GLUCOSE 98 01/14/2017 1030   BUN 13 08/11/2017 1155   CREATININE 0.87 08/11/2017 1155   CREATININE 0.85 12/09/2013 1453   CALCIUM 9.0 08/11/2017 1155   GFRNONAA 71 08/11/2017 1155   GFRAA 81 08/11/2017 1155   Lab Results  Component Value Date   HGBA1C 5.5 08/11/2017   HGBA1C 5.4 01/18/2009   HGBA1C 5.5 04/11/2008   Lab Results  Component Value Date   INSULIN 21.8 08/11/2017   CBC    Component Value Date/Time   WBC 9.2 08/11/2017 1155   WBC 10.3 01/10/2017 1404   RBC 4.34 08/11/2017 1155   RBC 4.05 01/10/2017 1404   HGB 13.3 08/11/2017 1155   HCT 40.8 08/11/2017 1155   PLT 358 08/11/2017 1155   MCV 94 08/11/2017 1155   MCH 30.6 08/11/2017 1155   MCH 30.9 12/09/2013 1453   MCHC 32.6 08/11/2017 1155   MCHC 32.2 01/10/2017 1404   RDW 14.8 08/11/2017 1155   LYMPHSABS 2.2 08/11/2017 1155   MONOABS 0.8 12/21/2010 1107   EOSABS 0.4 08/11/2017 1155   BASOSABS 0.0 08/11/2017 1155   Iron/TIBC/Ferritin/ %Sat No results found for: IRON, TIBC, FERRITIN,  IRONPCTSAT Lipid Panel     Component Value Date/Time   CHOL 150 08/11/2017 1155   TRIG 68 08/11/2017 1155   HDL 52 08/11/2017 1155   CHOLHDL 2.9 08/11/2017 1155   CHOLHDL 3 01/10/2017 1404   VLDL 27.8 01/10/2017 1404   LDLCALC 84 08/11/2017 1155   Hepatic Function Panel     Component Value Date/Time  PROT 7.4 08/11/2017 1155   ALBUMIN 4.2 08/11/2017 1155   AST 13 08/11/2017 1155   ALT 16 08/11/2017 1155   ALKPHOS 106 08/11/2017 1155   BILITOT 0.4 08/11/2017 1155   BILIDIR 0.1 12/09/2013 1453   IBILI 0.4 12/09/2013 1453      Component Value Date/Time   TSH 2.050 08/11/2017 1155   TSH 2.18 01/10/2017 1404   TSH 1.45 05/06/2016 1406   Results for ARADHYA, SHELLENBARGER (MRN [id number]) as of 09/25/2017 12:21  Ref. Range 08/11/2017 11:55  Vitamin D, 25-Hydroxy Latest Ref Range: 30.0 - 100.0 ng/mL 6.3 (L)   ASSESSMENT AND PLAN: Vitamin D deficiency - Plan: Vitamin D, Ergocalciferol, (DRISDOL) 50000 units CAPS capsule  Essential hypertension  Insulin resistance  At risk for osteoporosis  Class 3 severe obesity with serious comorbidity and body mass index (BMI) of 40.0 to 44.9 in adult, unspecified obesity type (HCC)  PLAN:  Vitamin D Deficiency Monta was informed that low vitamin D levels contributes to fatigue and are associated with obesity, breast, and colon cancer. She agrees to continue to take prescription Vit D @50 ,000 IU every week #4 with no refills and will follow up for routine testing of vitamin D, at least 2-3 times per year. She was informed of the risk of over-replacement of vitamin D and agrees to not increase her dose unless she discusses this with [name] first. Tranice agrees to follow up with our clinic in 2 weeks.  At risk for osteopenia and osteoporosis Almedia is at risk for osteopenia and osteoporosis due to her vitamin D deficiency. She was encouraged to take her vitamin D and follow her higher calcium diet and increase strengthening exercise to help  strengthen her bones and decrease her risk of osteopenia and osteoporosis.  Hypertension We discussed sodium restriction, working on healthy weight loss, and a regular exercise program as the means to achieve improved blood pressure control. Danay agreed with this plan and agreed to follow up as directed. We will continue to monitor her blood pressure as well as her progress with the above lifestyle modifications. She will continue Drisdol as prescribed and will watch for signs of hypotension as she continues her lifestyle modifications.  Obesity Regene is currently in the action stage of change. As such, her goal is to continue with weight loss efforts She has agreed to keep a food journal with 400 to 500 calories and 35 grams of protein at supper daily and follow the Category 2 plan Tyger has been instructed to work up to a goal of 150 minutes of combined cardio and strengthening exercise per week for weight loss and overall health benefits. We discussed the following Behavioral Modification Strategies today: planning for success, keep a strict food journal, increasing lean protein intake, increasing vegetables and work on meal planning and easy cooking plans  Meztli has agreed to follow up with our clinic in 2 weeks. She was informed of the importance of frequent follow up visits to maximize her success with intensive lifestyle modifications for her multiple health conditions.   OBESITY BEHAVIORAL INTERVENTION VISIT  Today's visit was # 3 out of 22.  Starting weight: 261 lbs Starting date: 08/11/17 Today's weight : 256 lbs  Today's date: 09/23/2017 Total lbs lost to date: 5 (Patients must lose 7 lbs in the first 6 months to continue with counseling)   ASK: We discussed the diagnosis of obesity with [name] today and Oneita agreed to give [name] permission to discuss obesity behavioral modification  therapy today.  ASSESS: Taleya has the diagnosis of obesity and her BMI today is  43.92 Jasleen is in the action stage of change   ADVISE: Shantay was educated on the multiple health risks of obesity as well as the benefit of weight loss to improve her health. She was advised of the need for long term treatment and the importance of lifestyle modifications.  AGREE: Multiple dietary modification options and treatment options were discussed and  Mariajose agreed to the above obesity treatment plan.  I, [place] Crane, am acting as transcriptionist for Filbert Schilder, MD  I have reviewed the above documentation for accuracy and completeness, and I agree with the above. - Debbra Riding, MD

## 2017-09-26 ENCOUNTER — Other Ambulatory Visit: Payer: Self-pay | Admitting: Family Medicine

## 2017-10-09 ENCOUNTER — Ambulatory Visit (INDEPENDENT_AMBULATORY_CARE_PROVIDER_SITE_OTHER): Payer: BC Managed Care – PPO | Admitting: Family Medicine

## 2017-10-22 ENCOUNTER — Ambulatory Visit (INDEPENDENT_AMBULATORY_CARE_PROVIDER_SITE_OTHER): Payer: BC Managed Care – PPO | Admitting: Family Medicine

## 2017-10-22 VITALS — BP 114/73 | HR 64 | Temp 98.1°F | Ht 64.0 in | Wt 258.0 lb

## 2017-10-22 DIAGNOSIS — E559 Vitamin D deficiency, unspecified: Secondary | ICD-10-CM

## 2017-10-22 DIAGNOSIS — Z6841 Body Mass Index (BMI) 40.0 and over, adult: Secondary | ICD-10-CM | POA: Diagnosis not present

## 2017-10-22 DIAGNOSIS — I10 Essential (primary) hypertension: Secondary | ICD-10-CM | POA: Diagnosis not present

## 2017-10-23 NOTE — Progress Notes (Signed)
Office: [phone]  /  Fax: [phone]   HPI:   Chief Complaint: OBESITY Terri [name] is here to discuss her progress with her obesity treatment plan. She is on the  keep a food journal with 400-500 calories and 35 grams of protein at supper daily and follow the Category 2 plan and is following her eating plan approximately 2 % of the time. She states she is exercising 0 minutes 0 times per week. Terri [name] has lots of life stress, 3 funerals in the past 3 weeks. Also had a birthday. Has been indulging in sweet tea again.  Her weight is 258 lb (117 kg) today and has gained 2 pounds since her last visit. She has lost 3 lbs since starting treatment with [name].  Vitamin D Deficiency Terri [name] has a diagnosis of vitamin D deficiency. She is currently taking prescription Vit D. She notes fatigue and denies nausea, vomiting or muscle weakness.  Hypertension Terri [name] is a 67 y.o. female with hypertension. Terri [name]'s blood pressure is controlled today. She denies chest pain, chest pressure, or headache. She is working weight loss to help control her blood pressure with the goal of decreasing her risk of heart attack and stroke.  ALLERGIES: Allergies  Allergen Reactions  . Sulfonamide Derivatives     MEDICATIONS: Current Outpatient Medications on File Prior to Visit  Medication Sig Dispense Refill  . amLODipine-olmesartan (AZOR) 10-40 MG tablet TAKE 1 TABLET BY MOUTH EVERY DAY 90 tablet 1  . levocetirizine (XYZAL) 5 MG tablet Take 5 mg by mouth every evening.    . montelukast (SINGULAIR) 10 MG tablet Take 1 tablet (10 mg total) by mouth at bedtime as needed. 30 tablet 5  . omeprazole (PRILOSEC) 40 MG capsule TAKE ONE CAPSULE BY MOUTH EVERY DAY 90 capsule 0  . oxybutynin (DITROPAN-XL) 10 MG 24 hr tablet Take 10 mg by mouth at bedtime.    . ranitidine (ZANTAC) 300 MG tablet TAKE 1 TABLET (300 MG TOTAL) BY MOUTH AT BEDTIME AS NEEDED FOR HEARTBURN. 30 tablet 3  . Vitamin D, Ergocalciferol, (DRISDOL)  50000 units CAPS capsule Take 1 capsule (50,000 Units total) by mouth every 7 (seven) days. 4 capsule 0   No current facility-administered medications on file prior to visit.     PAST MEDICAL HISTORY: Past Medical History:  Diagnosis Date  . Allergic rhinitis   . Apnea, sleep 06/28/2012   Previously used CPAP  . Asthma   . Atypical chest pain 01/14/2017  . Back pain   . Decreased visual acuity 08/05/2016  . Depression 06/19/2015  . Environmental and seasonal allergies   . GERD (gastroesophageal reflux disease)   . Heartburn   . Hip pain, right 06/28/2012  . HTN (hypertension)   . Hypokalemia 01/14/2017  . Leg pain   . Morbid obesity (HCC) 04/11/2008   Qualifier: Diagnosis of  By: [name]   . Numbness in feet 05/06/2016  . Obesity   . Osteoarthritis 01/16/2010   Qualifier: Diagnosis of  By: [name]   . Preventative health care 06/25/2015  . Shortness of breath   . Shortness of breath on exertion   . Sleep apnea   . Swelling of extremity   . Tinnitus   . Vertigo     PAST SURGICAL HISTORY: Past Surgical History:  Procedure Laterality Date  . BIOPSY BREAST  1997   left   . childbirth      SOCIAL HISTORY: Social History   Tobacco Use  .  Smoking status: Never Smoker  . Smokeless tobacco: Never Used  Substance Use Topics  . Alcohol use: Yes    Comment: social  . Drug use: No    FAMILY HISTORY: Family History  Problem Relation Age of Onset  . Stroke Mother   . Liver disease Mother        hepatitis b  . Hypertension Mother   . Cancer Father        esophagus, prostate  . Hypertension Father   . Heart disease Father   . Liver disease Brother        hep c  . Mental illness Brother        PTSD from [place]  . Stroke Maternal Grandmother   . Cancer Paternal Grandfather        prostate    ROS: Review of Systems  Constitutional: Positive for malaise/fatigue. Negative for weight loss.  Cardiovascular: Negative for chest pain.       Negative  chest pressure  Gastrointestinal: Negative for nausea and vomiting.  Musculoskeletal:       Negative muscle weakness  Neurological: Negative for headaches.    PHYSICAL EXAM: Blood pressure 114/73, pulse 64, temperature 98.1 F (36.7 C), temperature source Oral, height  (1.626 m), weight 258 lb (117 kg), SpO2 98 %. Body mass index is 44.29 kg/m. Physical Exam  Constitutional: She is oriented to person, place, and time. She appears well-developed and well-nourished.  Cardiovascular: Normal rate.  Pulmonary/Chest: Effort normal.  Musculoskeletal: Normal range of motion.  Neurological: She is oriented to person, place, and time.  Skin: Skin is warm and dry.  Psychiatric: She has a normal mood and affect. Her behavior is normal.  Vitals reviewed.   RECENT LABS AND TESTS: BMET    Component Value Date/Time   NA 145 (H) 08/11/2017 1155   K 4.5 08/11/2017 1155   CL 107 (H) 08/11/2017 1155   CO2 22 08/11/2017 1155   GLUCOSE 95 08/11/2017 1155   GLUCOSE 98 01/14/2017 1030   BUN 13 08/11/2017 1155   CREATININE 0.87 08/11/2017 1155   CREATININE 0.85 12/09/2013 1453   CALCIUM 9.0 08/11/2017 1155   GFRNONAA 71 08/11/2017 1155   GFRAA 81 08/11/2017 1155   Lab Results  Component Value Date   HGBA1C 5.5 08/11/2017   HGBA1C 5.4 01/18/2009   HGBA1C 5.5 04/11/2008   Lab Results  Component Value Date   INSULIN 21.8 08/11/2017   CBC    Component Value Date/Time   WBC 9.2 08/11/2017 1155   WBC 10.3 01/10/2017 1404   RBC 4.34 08/11/2017 1155   RBC 4.05 01/10/2017 1404   HGB 13.3 08/11/2017 1155   HCT 40.8 08/11/2017 1155   PLT 358 08/11/2017 1155   MCV 94 08/11/2017 1155   MCH 30.6 08/11/2017 1155   MCH 30.9 12/09/2013 1453   MCHC 32.6 08/11/2017 1155   MCHC 32.2 01/10/2017 1404   RDW 14.8 08/11/2017 1155   LYMPHSABS 2.2 08/11/2017 1155   MONOABS 0.8 12/21/2010 1107   EOSABS 0.4 08/11/2017 1155   BASOSABS 0.0 08/11/2017 1155   Iron/TIBC/Ferritin/ %Sat No results  found for: IRON, TIBC, FERRITIN, IRONPCTSAT Lipid Panel     Component Value Date/Time   CHOL 150 08/11/2017 1155   TRIG 68 08/11/2017 1155   HDL 52 08/11/2017 1155   CHOLHDL 2.9 08/11/2017 1155   CHOLHDL 3 01/10/2017 1404   VLDL 27.8 01/10/2017 1404   LDLCALC 84 08/11/2017 1155   Hepatic Function Panel  Component Value Date/Time   PROT 7.4 08/11/2017 1155   ALBUMIN 4.2 08/11/2017 1155   AST 13 08/11/2017 1155   ALT 16 08/11/2017 1155   ALKPHOS 106 08/11/2017 1155   BILITOT 0.4 08/11/2017 1155   BILIDIR 0.1 12/09/2013 1453   IBILI 0.4 12/09/2013 1453      Component Value Date/Time   TSH 2.050 08/11/2017 1155   TSH 2.18 01/10/2017 1404   TSH 1.45 05/06/2016 1406  Results for LENELL, MCCONNELL (MRN [id number]) as of 10/23/2017 09:50  Ref. Range 08/11/2017 11:55  Vitamin D, 25-Hydroxy Latest Ref Range: 30.0 - 100.0 ng/mL 6.3 (L)    ASSESSMENT AND PLAN: Vitamin D deficiency  Essential hypertension  Class 3 severe obesity with serious comorbidity and body mass index (BMI) of 40.0 to 44.9 in adult, unspecified obesity type (HCC)  PLAN:  Vitamin D Deficiency Terri [name] was informed that low vitamin D levels contributes to fatigue and are associated with obesity, breast, and colon cancer. Terri [name] agrees to continue taking prescription Vit D ,000 IU every week and will follow up for routine testing of vitamin D, at least 2-3 times per year. She was informed of the risk of over-replacement of vitamin D and agrees to not increase her dose unless she discusses this with [name] first. Terri [name] agrees to follow up with our clinic in 2 weeks.  Hypertension We discussed sodium restriction, working on healthy weight loss, and a regular exercise program as the means to achieve improved blood pressure control. Terri [name] agreed with this plan and agreed to follow up as directed. We will continue to monitor her blood pressure as well as her progress with the above lifestyle modifications. She will  continue her current medications and will watch for signs of hypotension as she continues her lifestyle modifications. Terri [name] agrees to follow up with our clinic in 2 weeks.  We spent > than 50% of the 15 minute visit on the counseling as documented in the note.  Obesity Terri [name] is currently in the action stage of change. As such, her goal is to continue with weight loss efforts She has agreed to keep a food journal with 400-500 calories and 35+ grams of protein at supper daily and follow the Category 2 plan Terri [name] has been instructed to work up to a goal of 150 minutes of combined cardio and strengthening exercise per week for weight loss and overall health benefits. We discussed the following Behavioral Modification Strategies today: increasing lean protein intake, decrease eating out, work on meal planning and easy cooking plans, better snacking choices, planning for success, and keep a strict food journal   Terri [name] has agreed to follow up with our clinic in 2 weeks. She was informed of the importance of frequent follow up visits to maximize her success with intensive lifestyle modifications for her multiple health conditions.   OBESITY BEHAVIORAL INTERVENTION VISIT  Today's visit was # 4 out of 22.  Starting weight: 261 lbs Starting date: 08/11/17 Today's weight : 258 lbs Today's date: 10/22/2017 Total lbs lost to date: 3 (Patients must lose 7 lbs in the first 6 months to continue with counseling)   ASK: We discussed the diagnosis of obesity with Terri [name] today and Terri [name] agreed to give [name] permission to discuss obesity behavioral modification therapy today.  ASSESS: Terri [name] has the diagnosis of obesity and her BMI today is 44.26 Deberah is in the action stage of change   ADVISE: Terri [name] was educated on the multiple health risks of obesity as  well as the benefit of weight loss to improve her health. She was advised of the need for long term treatment and the importance of  lifestyle modifications.  AGREE: Multiple dietary modification options and treatment options were discussed and  Terri [name] agreed to the above obesity treatment plan.  I, Burt Knack, am acting as transcriptionist for Debbra Riding, MD  I have reviewed the above documentation for accuracy and completeness, and I agree with the above. - Debbra Riding, MD

## 2017-11-12 ENCOUNTER — Ambulatory Visit (INDEPENDENT_AMBULATORY_CARE_PROVIDER_SITE_OTHER): Payer: BC Managed Care – PPO | Admitting: Family Medicine

## 2017-11-12 VITALS — BP 149/87 | HR 64 | Temp 97.5°F | Ht 64.0 in | Wt 257.0 lb

## 2017-11-12 DIAGNOSIS — E559 Vitamin D deficiency, unspecified: Secondary | ICD-10-CM

## 2017-11-12 DIAGNOSIS — Z6841 Body Mass Index (BMI) 40.0 and over, adult: Secondary | ICD-10-CM | POA: Diagnosis not present

## 2017-11-12 DIAGNOSIS — I10 Essential (primary) hypertension: Secondary | ICD-10-CM | POA: Diagnosis not present

## 2017-11-12 DIAGNOSIS — E66813 Obesity, class 3: Secondary | ICD-10-CM

## 2017-11-12 DIAGNOSIS — F3289 Other specified depressive episodes: Secondary | ICD-10-CM | POA: Diagnosis not present

## 2017-11-12 MED ORDER — VITAMIN D (ERGOCALCIFEROL) 1.25 MG (50000 UNIT) PO CAPS: 50000.0000 [IU] | ORAL_CAPSULE | ORAL | 0 refills | Status: DC

## 2017-11-13 ENCOUNTER — Telehealth: Payer: Self-pay | Admitting: Family Medicine

## 2017-11-13 NOTE — Telephone Encounter (Signed)
Pt needs refill on Azor. Please call or let her know when it is sent.

## 2017-11-14 MED ORDER — AMLODIPINE-OLMESARTAN 10-40 MG PO TABS: 1.0000 | ORAL_TABLET | Freq: Every day | ORAL | 1 refills | Status: DC

## 2017-11-14 NOTE — Telephone Encounter (Signed)
Medication sent in. 

## 2017-11-15 ENCOUNTER — Other Ambulatory Visit: Payer: Self-pay | Admitting: Family Medicine

## 2017-11-17 ENCOUNTER — Ambulatory Visit (INDEPENDENT_AMBULATORY_CARE_PROVIDER_SITE_OTHER): Payer: BC Managed Care – PPO | Admitting: Psychology

## 2017-11-17 DIAGNOSIS — F3289 Other specified depressive episodes: Secondary | ICD-10-CM

## 2017-11-17 NOTE — Progress Notes (Signed)
Office: [phone]  /  Fax: [phone]   HPI:   Chief Complaint: OBESITY Terri [name] is here to discuss her progress with her obesity treatment plan. She is on the  keep a food journal with 400 to 500 calories and 35+ grams of protein at supper daily and follow the Category 2 plan and is following her eating plan approximately 60 % of the time. She states she is exercising 0 minutes 0 times per week. Terri [name] continues to do well with weight loss on her category 2 plan. Hunger is controlled. Polyphagia is decreased. Her weight is 257 lb (116.6 kg) today and has had a weight loss of 1 pound over a period of 3 weeks since her last visit. She has lost 4 lbs since starting treatment with [name].  Vitamin D deficiency Terri [name] has a diagnosis of vitamin D deficiency. Terri [name] is stable on vit D, but she is not yet at goal. She denies nausea, vomiting or muscle weakness.  Hypertension Terri [name] is a 67 y.o. female with hypertension. Her blood pressure is elevated today and is normally well controlled on medications. Terri [name] denies chest pain or headache. She is working weight loss to help control her blood pressure with the goal of decreasing her risk of heart attack and stroke. Terri [name] blood pressure is not currently controlled.  Depression with emotional eating behaviors Terri [name] notes increased stress and increased emotional eating. She is feeling frustrated with herself and guilty about less than ideal food choices. Terri [name] struggles with emotional eating and using food for comfort to the extent that it is negatively impacting her health. She often snacks when she is not hungry. Terri [name] sometimes feels she is out of control and then feels guilty that she made poor food choices. She has been working on behavior modification techniques to help reduce her emotional eating and has been somewhat successful. She shows no sign of suicidal or homicidal ideations.  Depression screen Terri [name] 2/9 11/17/2017  08/11/2017 05/06/2016  Decreased Interest 0 1 0  Down, Depressed, Hopeless 0 1 0  PHQ - 2 Score 0 2 0  Altered sleeping - 0 -  Tired, decreased energy - 1 -  Change in appetite - 0 -  Feeling bad or failure about yourself  - 1 -  Trouble concentrating - 0 -  Moving slowly or fidgety/restless - 1 -  Suicidal thoughts - 0 -  PHQ-9 Score - 5 -  Difficult doing work/chores - Not difficult at all -      ALLERGIES: Allergies  Allergen Reactions  . Sulfonamide Derivatives     MEDICATIONS: Current Outpatient Medications on File Prior to Visit  Medication Sig Dispense Refill  . levocetirizine (XYZAL) 5 MG tablet Take 5 mg by mouth every evening.    . montelukast (SINGULAIR) 10 MG tablet Take 1 tablet (10 mg total) by mouth at bedtime as needed. 30 tablet 5  . oxybutynin (DITROPAN-XL) 10 MG 24 hr tablet Take 10 mg by mouth at bedtime.    . ranitidine (ZANTAC) 300 MG tablet TAKE 1 TABLET (300 MG TOTAL) BY MOUTH AT BEDTIME AS NEEDED FOR HEARTBURN. 30 tablet 3   No current facility-administered medications on file prior to visit.     PAST MEDICAL HISTORY: Past Medical History:  Diagnosis Date  . Allergic rhinitis   . Apnea, sleep 06/28/2012   Previously used CPAP  . Asthma   . Atypical chest pain 01/14/2017  . Back pain   . Decreased visual acuity  08/05/2016  . Depression 06/19/2015  . Environmental and seasonal allergies   . GERD (gastroesophageal reflux disease)   . Heartburn   . Hip pain, right 06/28/2012  . HTN (hypertension)   . Hypokalemia 01/14/2017  . Leg pain   . Morbid obesity (HCC) 04/11/2008   Qualifier: Diagnosis of  By: Nena JordanYoo DO, D. Robert   . Numbness in feet 05/06/2016  . Obesity   . Osteoarthritis 01/16/2010   Qualifier: Diagnosis of  By: Nena JordanYoo DO, D. Robert   . Preventative health care 06/25/2015  . Shortness of breath   . Shortness of breath on exertion   . Sleep apnea   . Swelling of extremity   . Tinnitus   . Vertigo     PAST SURGICAL HISTORY: Past Surgical  History:  Procedure Laterality Date  . BIOPSY BREAST  1997   left   . childbirth      SOCIAL HISTORY: Social History   Tobacco Use  . Smoking status: Never Smoker  . Smokeless tobacco: Never Used  Substance Use Topics  . Alcohol use: Yes    Comment: social  . Drug use: No    FAMILY HISTORY: Family History  Problem Relation Age of Onset  . Stroke Mother   . Liver disease Mother        hepatitis b  . Hypertension Mother   . Cancer Father        esophagus, prostate  . Hypertension Father   . Heart disease Father   . Liver disease Brother        hep c  . Mental illness Brother        PTSD from [place]  . Stroke Maternal Grandmother   . Cancer Paternal Grandfather        prostate    ROS: Review of Systems  Constitutional: Positive for weight loss.  Cardiovascular: Negative for chest pain.  Gastrointestinal: Negative for nausea and vomiting.  Musculoskeletal:       Negative for muscle weakness  Neurological: Negative for headaches.  Endo/Heme/Allergies:       Positive for polyphagia  Psychiatric/Behavioral: Positive for depression. Negative for suicidal ideas.       Positive for Stress    PHYSICAL EXAM: Blood pressure (!) 149/87, pulse 64, temperature (!) 97.5 F (36.4 C), temperature source Oral, height 5\' 4"  (1.626 m), weight 257 lb (116.6 kg), SpO2 100 %. Body mass index is 44.11 kg/m. Physical Exam  Constitutional: She is oriented to person, place, and time. She appears well-developed and well-nourished.  Cardiovascular: Normal rate.  Pulmonary/Chest: Effort normal.  Musculoskeletal: Normal range of motion.  Neurological: She is oriented to person, place, and time.  Skin: Skin is warm and dry.  Psychiatric: She has a normal mood and affect. Her behavior is normal.  Vitals reviewed.   RECENT LABS AND TESTS: BMET    Component Value Date/Time   NA 145 (H) 08/11/2017 1155   K 4.5 08/11/2017 1155   CL 107 (H) 08/11/2017 1155   CO2 22 08/11/2017  1155   GLUCOSE 95 08/11/2017 1155   GLUCOSE 98 01/14/2017 1030   BUN 13 08/11/2017 1155   CREATININE 0.87 08/11/2017 1155   CREATININE 0.85 12/09/2013 1453   CALCIUM 9.0 08/11/2017 1155   GFRNONAA 71 08/11/2017 1155   GFRAA 81 08/11/2017 1155   Lab Results  Component Value Date   HGBA1C 5.5 08/11/2017   HGBA1C 5.4 01/18/2009   HGBA1C 5.5 04/11/2008   Lab Results  Component Value Date  INSULIN 21.8 08/11/2017   CBC    Component Value Date/Time   WBC 9.2 08/11/2017 1155   WBC 10.3 01/10/2017 1404   RBC 4.34 08/11/2017 1155   RBC 4.05 01/10/2017 1404   HGB 13.3 08/11/2017 1155   HCT 40.8 08/11/2017 1155   PLT 358 08/11/2017 1155   MCV 94 08/11/2017 1155   MCH 30.6 08/11/2017 1155   MCH 30.9 12/09/2013 1453   MCHC 32.6 08/11/2017 1155   MCHC 32.2 01/10/2017 1404   RDW 14.8 08/11/2017 1155   LYMPHSABS 2.2 08/11/2017 1155   MONOABS 0.8 12/21/2010 1107   EOSABS 0.4 08/11/2017 1155   BASOSABS 0.0 08/11/2017 1155   Iron/TIBC/Ferritin/ %Sat No results found for: IRON, TIBC, FERRITIN, IRONPCTSAT Lipid Panel     Component Value Date/Time   CHOL 150 08/11/2017 1155   TRIG 68 08/11/2017 1155   HDL 52 08/11/2017 1155   CHOLHDL 2.9 08/11/2017 1155   CHOLHDL 3 01/10/2017 1404   VLDL 27.8 01/10/2017 1404   LDLCALC 84 08/11/2017 1155   Hepatic Function Panel     Component Value Date/Time   PROT 7.4 08/11/2017 1155   ALBUMIN 4.2 08/11/2017 1155   AST 13 08/11/2017 1155   ALT 16 08/11/2017 1155   ALKPHOS 106 08/11/2017 1155   BILITOT 0.4 08/11/2017 1155   BILIDIR 0.1 12/09/2013 1453   IBILI 0.4 12/09/2013 1453      Component Value Date/Time   TSH 2.050 08/11/2017 1155   TSH 2.18 01/10/2017 1404   TSH 1.45 05/06/2016 1406   Results for SHAREE, STURDY (MRN [id number]) as of 11/17/2017 16:20  Ref. Range 08/11/2017 11:55  Vitamin D, 25-Hydroxy Latest Ref Range: 30.0 - 100.0 ng/mL 6.3 (L)   ASSESSMENT AND PLAN: Vitamin D deficiency - Plan: Vitamin D,  Ergocalciferol, (DRISDOL) 50000 units CAPS capsule  Essential hypertension  Other depression - with emotional eating  Class 3 severe obesity with serious comorbidity and body mass index (BMI) of 40.0 to 44.9 in adult, unspecified obesity type (HCC)  PLAN:  Vitamin D Deficiency Terri [name] was informed that low vitamin D levels contributes to fatigue and are associated with obesity, breast, and colon cancer. She agrees to continue to take prescription Vit D @50 ,000 IU every week #4 with no refills and will follow up for routine testing of vitamin D, at least 2-3 times per year. She was informed of the risk of over-replacement of vitamin D and agrees to not increase her dose unless she discusses this with [name] first. Terri [name] agrees to follow up as directed.  Hypertension We discussed sodium restriction, working on healthy weight loss, and a regular exercise program as the means to achieve improved blood pressure control. Terri [name] agreed with this plan and agreed to follow up as directed. We will continue to monitor her blood pressure as well as her progress with the above lifestyle modifications. She will continue her medications as prescribed and will watch for signs of hypotension as she continues her lifestyle modifications.  Depression with Emotional Eating Behaviors We discussed behavior modification techniques today to help Terri [name] deal with her emotional eating and depression. We will refer to Dr. Dewaine [name] for evaluation and treatment.  Obesity Terri [name] is currently in the action stage of change. As such, her goal is to continue with weight loss efforts She has agreed to follow the Category 2 plan Terri [name] has been instructed to work up to a goal of 150 minutes of combined cardio and strengthening exercise per week for weight loss and  overall health benefits. We discussed the following Behavioral Modification Strategies today: increasing lean protein intake, decreasing simple carbohydrates  and work on  meal planning and easy cooking plans  Terri [name] has agreed to follow up with our clinic in 2 to 3 weeks. She was informed of the importance of frequent follow up visits to maximize her success with intensive lifestyle modifications for her multiple health conditions.   OBESITY BEHAVIORAL INTERVENTION VISIT  Today's visit was # 5 out of 22.  Starting weight: 261 lbs Starting date: 08/11/17 Today's weight : 257 lbs Today's date: 11/12/2017 Total lbs lost to date: 4 (Patients must lose 7 lbs in the first 6 months to continue with counseling)   ASK: We discussed the diagnosis of obesity with Terri [name] today and Saoirse agreed to give [name] permission to discuss obesity behavioral modification therapy today.  ASSESS: Terri [name] has the diagnosis of obesity and her BMI today is 44.09 Calleigh is in the action stage of change   ADVISE: Tamme was educated on the multiple health risks of obesity as well as the benefit of weight loss to improve her health. She was advised of the need for long term treatment and the importance of lifestyle modifications.  AGREE: Multiple dietary modification options and treatment options were discussed and  Lenox agreed to the above obesity treatment plan.  I, [place] Crane, am acting as transcriptionist for Quillian Quince, MD  I have reviewed the above documentation for accuracy and completeness, and I agree with the above. -Quillian Quince, MD
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
## 2017-11-25 ENCOUNTER — Ambulatory Visit: Payer: Self-pay | Admitting: Neurology

## 2017-11-26 NOTE — Progress Notes (Signed)
  Office: [phone]  /  Fax: [phone]   Date: December 01, 2017 Time Seen: 11:07am Duration: 26 minutes Provider: Lawerance CruelGaytri Simcha Farrington, Psy.D. Type of Session: Individual Therapy   HPI: Per the note for the initial session with this provider on November 17, 2017, Terri [name] was referred by Dr. Quillian Quincearen [name]. Per Terri [name], "I just have a barrier when it comes to cooking. I just don't like to cook." She reported she is doing "okay" on the meal plan for breakfast and lunch; however, she indicated, "I have no food in the house." Terri [name] experiences cravings for bread and sweets. After indulging, she often finds herself thinking, "You really shouldn't have done that." The aforementioned is accompanied by guilt sometimes. Terri [name] denied binge eating. Terri [name] also reported experiencing decreased motivation as it relates to weight loss and worry about finances. Regarding not having food in the house, Terri [name] indicated this started approximately 25 years ago after her son moved out. She noted she would always eat out. Low motivation reportedly began five to ten years ago.   Session Content: Session focused on the goal of increasing motivation via increasing awareness. 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 She added, "I am blessed that I have three high schools I could go to." Additionally, Terri [name] explained, "I am frustrated, but not stressed." Regarding eating, Terri [name] noted, "If I could just give up sweets" and acknowledged, "I'm not even following the diet." Session then focused on reviewing "should" statements as they relate to eating. Session also focused on the connection between thoughts, feelings, and behaviors as "should" statements can impact feelings and behaviors. Moreover, psychoeducation regarding physical versus emotional hunger was provided.  Terri [name] was receptive to today's session as evidenced by her providing examples for topics discussed.   Mental Status  Examination: Terri [name] arrived late for the appointment. She presented as appropriately dressed and groomed. Terri [name] appeared her stated age and demonstrated adequate orientation to time, place, person, and purpose of the appointment. She also demonstrated appropriate eye contact. No psychomotor abnormalities or behavioral peculiarities noted. Her mood was euthymic with congruent affect. Her thought processes were logical, linear, and goal-directed. No hallucinations, delusions, bizarre thinking or behavior reported or observed. Judgment, insight, and impulse control appeared to be grossly intact. There was no evidence of paraphasias (i.e., errors in speech, gross mispronunciations, and word substitutions), repetition deficits, or disturbances in volume or prosody (i.e., rhythm and intonation). There was no evidence of attention or memory impairments. Terri [name] denied current suicidal and homicidal ideation, intent or plan.  Interventions: Content from the last session was reviewed (I.e., "should" statements). Throughout today's session, empathic reflections and validation were provided. Psychoeducation regarding the connection between thoughts, feelings, and behaviors was provided. Psychoeducation regarding physical versus emotional hunger was also provided. Terri [name] was provided with handouts for the aforementioned. The provider encouraged Terri [name] to utilize the handouts to increase her awareness to her eating patterns, especially instances of emotional eating. She agreed.   DSM-5 Diagnosis: 311 (F32.8) Other Specified Depressive Disorder, Emotional Eating   Plan: Terri [name] continues to appear able and willing to participate as evidenced by engagement in reciprocal conversation and asking questions for clarification to ensure understanding. The next appointment will be scheduled in two weeks. Session will focus on reviewing learned skills and working towards the established treatment goal.

## 2017-12-01 ENCOUNTER — Ambulatory Visit (INDEPENDENT_AMBULATORY_CARE_PROVIDER_SITE_OTHER): Payer: BC Managed Care – PPO | Admitting: Psychology

## 2017-12-01 ENCOUNTER — Ambulatory Visit (INDEPENDENT_AMBULATORY_CARE_PROVIDER_SITE_OTHER): Payer: BC Managed Care – PPO | Admitting: Family Medicine

## 2017-12-01 VITALS — BP 126/77 | HR 58 | Temp 98.1°F | Ht 64.0 in | Wt 254.0 lb

## 2017-12-01 DIAGNOSIS — Z6841 Body Mass Index (BMI) 40.0 and over, adult: Secondary | ICD-10-CM | POA: Diagnosis not present

## 2017-12-01 DIAGNOSIS — K219 Gastro-esophageal reflux disease without esophagitis: Secondary | ICD-10-CM

## 2017-12-01 DIAGNOSIS — F3289 Other specified depressive episodes: Secondary | ICD-10-CM

## 2017-12-01 NOTE — Progress Notes (Signed)
Office: [phone]  /  Fax: [phone]   HPI:   Chief Complaint: OBESITY Terri [name] is here to discuss her progress with her obesity treatment plan. She is on the Category 2 plan and is following her eating plan approximately 20 % of the time. She states she is exercising 0 minutes 0 times per week. Terri [name] continues to do well with weight loss. She is in a different routine over the summer.  Her weight is 254 lb (115.2 kg) today and has had a weight loss of 3 pounds over a period of 2 weeks since her last visit. She has lost 7 lbs since starting treatment with [name].  GERD Terri [name] is on Prilosec and Zantac as needed, but still having symptoms especially at night with occasional coughing while awake. She has a family history of esophageal cancer.  ALLERGIES: Allergies  Allergen Reactions  . Sulfonamide Derivatives     MEDICATIONS: Current Outpatient Medications on File Prior to Visit  Medication Sig Dispense Refill  . amLODipine-olmesartan (AZOR) 10-40 MG tablet Take 1 tablet by mouth daily. 90 tablet 1  . levocetirizine (XYZAL) 5 MG tablet Take 5 mg by mouth every evening.    . montelukast (SINGULAIR) 10 MG tablet Take 1 tablet (10 mg total) by mouth at bedtime as needed. 30 tablet 5  . omeprazole (PRILOSEC) 40 MG capsule TAKE ONE CAPSULE BY MOUTH EVERY DAY 90 capsule 0  . oxybutynin (DITROPAN-XL) 10 MG 24 hr tablet Take 10 mg by mouth at bedtime.    . ranitidine (ZANTAC) 300 MG tablet TAKE 1 TABLET (300 MG TOTAL) BY MOUTH AT BEDTIME AS NEEDED FOR HEARTBURN. 30 tablet 3  . Vitamin D, Ergocalciferol, (DRISDOL) 50000 units CAPS capsule Take 1 capsule (50,000 Units total) by mouth every 7 (seven) days. 4 capsule 0   No current facility-administered medications on file prior to visit.     PAST MEDICAL HISTORY: Past Medical History:  Diagnosis Date  . Allergic rhinitis   . Apnea, sleep 06/28/2012   Previously used CPAP  . Asthma   . Atypical chest pain 01/14/2017  . Back pain   .  Decreased visual acuity 08/05/2016  . Depression 06/19/2015  . Environmental and seasonal allergies   . GERD (gastroesophageal reflux disease)   . Heartburn   . Hip pain, right 06/28/2012  . HTN (hypertension)   . Hypokalemia 01/14/2017  . Leg pain   . Morbid obesity (HCC) 04/11/2008   Qualifier: Diagnosis of  By: [name]   . Numbness in feet 05/06/2016  . Obesity   . Osteoarthritis 01/16/2010   Qualifier: Diagnosis of  By: [name]   . Preventative health care 06/25/2015  . Shortness of breath   . Shortness of breath on exertion   . Sleep apnea   . Swelling of extremity   . Tinnitus   . Vertigo     PAST SURGICAL HISTORY: Past Surgical History:  Procedure Laterality Date  . BIOPSY BREAST  1997   left   . childbirth      SOCIAL HISTORY: Social History   Tobacco Use  . Smoking status: Never Smoker  . Smokeless tobacco: Never Used  Substance Use Topics  . Alcohol use: Yes    Comment: social  . Drug use: No    FAMILY HISTORY: Family History  Problem Relation Age of Onset  . Stroke Mother   . Liver disease Mother        hepatitis b  . Hypertension  Mother   . Cancer Father        esophagus, prostate  . Hypertension Father   . Heart disease Father   . Liver disease Brother        hep c  . Mental illness Brother        PTSD from [place]  . Stroke Maternal Grandmother   . Cancer Paternal Grandfather        prostate    ROS: Review of Systems  Constitutional: Positive for weight loss.  Respiratory: Positive for cough.   Gastrointestinal: Positive for heartburn.    PHYSICAL EXAM: Blood pressure 126/77, pulse (!) 58, temperature 98.1 F (36.7 C), temperature source Oral, height 5\' 4"  (1.626 m), weight 254 lb (115.2 kg), SpO2 98 %. Body mass index is 43.6 kg/m. Physical Exam  Constitutional: She is oriented to person, place, and time. She appears well-developed and well-nourished.  Cardiovascular: Normal rate.  Pulmonary/Chest: Effort normal.   Musculoskeletal: Normal range of motion.  Neurological: She is oriented to person, place, and time.  Skin: Skin is warm and dry.  Psychiatric: She has a normal mood and affect. Her behavior is normal.  Vitals reviewed.   RECENT LABS AND TESTS: BMET    Component Value Date/Time   NA 145 (H) 08/11/2017 1155   K 4.5 08/11/2017 1155   CL 107 (H) 08/11/2017 1155   CO2 22 08/11/2017 1155   GLUCOSE 95 08/11/2017 1155   GLUCOSE 98 01/14/2017 1030   BUN 13 08/11/2017 1155   CREATININE 0.87 08/11/2017 1155   CREATININE 0.85 12/09/2013 1453   CALCIUM 9.0 08/11/2017 1155   GFRNONAA 71 08/11/2017 1155   GFRAA 81 08/11/2017 1155   Lab Results  Component Value Date   HGBA1C 5.5 08/11/2017   HGBA1C 5.4 01/18/2009   HGBA1C 5.5 04/11/2008   Lab Results  Component Value Date   INSULIN 21.8 08/11/2017   CBC    Component Value Date/Time   WBC 9.2 08/11/2017 1155   WBC 10.3 01/10/2017 1404   RBC 4.34 08/11/2017 1155   RBC 4.05 01/10/2017 1404   HGB 13.3 08/11/2017 1155   HCT 40.8 08/11/2017 1155   PLT 358 08/11/2017 1155   MCV 94 08/11/2017 1155   MCH 30.6 08/11/2017 1155   MCH 30.9 12/09/2013 1453   MCHC 32.6 08/11/2017 1155   MCHC 32.2 01/10/2017 1404   RDW 14.8 08/11/2017 1155   LYMPHSABS 2.2 08/11/2017 1155   MONOABS 0.8 12/21/2010 1107   EOSABS 0.4 08/11/2017 1155   BASOSABS 0.0 08/11/2017 1155   Iron/TIBC/Ferritin/ %Sat No results found for: IRON, TIBC, FERRITIN, IRONPCTSAT Lipid Panel     Component Value Date/Time   CHOL 150 08/11/2017 1155   TRIG 68 08/11/2017 1155   HDL 52 08/11/2017 1155   CHOLHDL 2.9 08/11/2017 1155   CHOLHDL 3 01/10/2017 1404   VLDL 27.8 01/10/2017 1404   LDLCALC 84 08/11/2017 1155   Hepatic Function Panel     Component Value Date/Time   PROT 7.4 08/11/2017 1155   ALBUMIN 4.2 08/11/2017 1155   AST 13 08/11/2017 1155   ALT 16 08/11/2017 1155   ALKPHOS 106 08/11/2017 1155   BILITOT 0.4 08/11/2017 1155   BILIDIR 0.1 12/09/2013 1453    IBILI 0.4 12/09/2013 1453      Component Value Date/Time   TSH 2.050 08/11/2017 1155   TSH 2.18 01/10/2017 1404   TSH 1.45 05/06/2016 1406    ASSESSMENT AND PLAN: Gastroesophageal reflux disease without esophagitis  Class 3 severe  obesity with serious comorbidity and body mass index (BMI) of 40.0 to 44.9 in adult, unspecified obesity type [facility](HCC)  PLAN:  GERD Terri [name] is to continue taking Prilosec q AM and start Zantac q AC every night. She may need and EGD for further evaluation. Terri [name] agrees to follow up with our clinic in 2 to 3 weeks.  We spent > than 50% of the 15 minute visit on the counseling as documented in the note.  Obesity Terri [name] is currently in the action stage of change. As such, her goal is to continue with weight loss efforts She has agreed to follow the Category 2 plan Terri [name] has been instructed to work up to a goal of 150 minutes of combined cardio and strengthening exercise per week for weight loss and overall health benefits. We discussed the following Behavioral Modification Strategies today: increasing lean protein intake and work on meal planning and easy cooking plans We discussed free [employer] grocery app to help with meal planning and prepping.  Terri [name] has agreed to follow up with our clinic in 2 to 3 weeks. She was informed of the importance of frequent follow up visits to maximize her success with intensive lifestyle modifications for her multiple health conditions.   OBESITY BEHAVIORAL INTERVENTION VISIT  Today's visit was # 7 out of 22.  Starting weight: 261 lbs Starting date: 08/11/17 Today's weight : 254 lbs  Today's date: 12/01/2017 Total lbs lost to date: 7 (Patients must lose 7 lbs in the first 6 months to continue with counseling)   ASK: We discussed the diagnosis of obesity with Sharlyne PacasPamela E [name] today and Terri [name] agreed to give us permission to discuss obesity behavioral modification therapy today.  ASSESS: Terri [name] has the diagnosis of  obesity and her BMI today is 43.58 Terri [name] is in the action stage of change   ADVISE: Terri [name] was educated on the multiple health risks of obesity as well as the benefit of weight loss to improve her health. She was advised of the need for long term treatment and the importance of lifestyle modifications.  AGREE: Multiple dietary modification options and treatment options were discussed and  Terri [name] agreed to the above obesity treatment plan.  I, Burt KnackSharon Martin, am acting as transcriptionist for Quillian Quincearen Izyan Ezzell, MD  I have reviewed the above documentation for accuracy and completeness, and I agree with the above. -Quillian Quincearen Enriqueta Augusta, MD

## 2017-12-06 ENCOUNTER — Other Ambulatory Visit (INDEPENDENT_AMBULATORY_CARE_PROVIDER_SITE_OTHER): Payer: Self-pay | Admitting: Family Medicine

## 2017-12-06 DIAGNOSIS — E559 Vitamin D deficiency, unspecified: Secondary | ICD-10-CM

## 2017-12-09 NOTE — Progress Notes (Signed)
Office: [phone]  /  Fax: [phone]    Date: December 22, 2017 Time Seen: 9:33am Duration: 35 minutes Provider: Lawerance Cruel, Psy.D. Type of Session: Individual Therapy   HPI: Per the note for the initial session with this provider on November 17, 2017, Terri [name] referred by Dr. Quillian Quince. Per Terri [name], "I just have a barrier when it comes to cooking. I just don't like to cook." She reported she is doing "okay" on the meal plan for breakfast and lunch; however, she indicated, "I have no food in the house." Terri [name] experiences cravings for bread and sweets. After indulging, she often finds herself thinking, "You really shouldn't have done that." The aforementioned is accompanied by guilt sometimes. Terri [name] denied binge eating. Terri [name] also reported experiencing decreased motivation as it relates to weight loss and worry about finances.Regarding not having food in the house, Terri [name] indicated this started approximately 25 years ago after her son moved out. She noted she would always eat out. Low motivation reportedly began five to ten years ago.  Session Content: Session focused on the goal of increasing motivation. The session was initiated with the administration of the PHQ-9 and GAD-7, as well as a brief check-in. 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 Terri [name] acknowledged she is not following the meal plan, but is "making better choices," especially at fast food places. She reported she does not like going into grocery stores; therefore, using [employer]'s free service for pick up was discussed. Moreover, Terri [name] described there being long periods where she does not eat, which often results in her eating larger portions in the  evening. She also acknowledged she is not mindful when eating, resulting in her not enjoying what she is consuming. Thus, session focused on increasing mindful eating. The provider explored with Terri [name] where she typically consumes her meals (I.e., in her bed in front of the television). As such, the provider and Terri [name] discussed how waiting until commercial breaks to eat can assist in giving her mind and body time to send and receive signals of hunger and satiation. Furthermore, the provider encouraged Terri [name] to focus on the various flavors for anything she is consuming to help her increase enjoyment in what she is consuming, and also increase awareness around foods she may potentially be consuming that she does not truly enjoy. Terri [name] agreed.  Terri [name] was receptive to today's session as evidenced by discussing the past three weeks and providing examples to demonstrate understanding of learned skills.   Mental Status Examination: Terri [name] arrived couple minutes late for the appointment. She presented as appropriately dressed and groomed. Terri [name] appeared her stated age and demonstrated adequate orientation to time, place, person, and purpose of the appointment. She also demonstrated appropriate eye contact. No psychomotor abnormalities or behavioral peculiarities noted. Her mood was euthymic with congruent affect. Her thought processes were logical, linear, and goal-directed. No hallucinations, delusions, bizarre thinking or behavior reported or observed. Judgment, insight, and impulse control appeared to be grossly intact. There was no evidence of paraphasias (i.e., errors in speech, gross mispronunciations, and word substitutions), repetition deficits, or disturbances in volume or prosody (i.e., rhythm and intonation). There was no evidence of attention or memory impairments. Terri [name] denied current suicidal and homicidal ideation, intent or plan.  Structured Assessment Results:The Patient Health Questionnaire-9  (PHQ-9) is a self-report measure that assesses symptoms and severity of depression over the course of the last two weeks. Terri [name] obtained a score of six suggesting mild depression.  Terri [name] finds the endorsed symptoms to be not difficult at all. Depression screen PHQ 2/9 12/22/2017  Decreased Interest 1  Down, Depressed, Hopeless 0  PHQ - 2 Score 1  Altered sleeping 0  Tired, decreased energy 2  Change in appetite 3  Feeling bad or failure about yourself  0  Trouble concentrating 0  Moving slowly or fidgety/restless 0  Suicidal thoughts 0  PHQ-9 Score 6  Difficult doing work/chores -   The Generalized Anxiety Disorder-7 (GAD-7) is a brief self-report measure that assesses symptoms of anxiety over the course of the last two weeks. Terri [name] obtained a score of four suggesting minimal anxiety.  GAD 7 : Generalized Anxiety Score 12/22/2017  Nervous, Anxious, on Edge 2  Control/stop worrying 1  Worry too much - different things 1  Trouble relaxing 0  Restless 0  Easily annoyed or irritable 0  Afraid - awful might happen 0  Total GAD 7 Score 4  Anxiety Difficulty Not difficult at all   Interventions: The PHQ-9 and GAD-7 were administered for symptom monitoring. Content from the last session was reviewed (I.e., the connection between thoughts, feelings, and behaviors, physical versus emotional hunger, and triggers for emotional eating). Throughout today's session, empathic reflections and validation were provided. Psychoeducation regarding various skills to increase mindful eating were provided.   DSM-5 Diagnosis: 311 (F32.8) Other Specified Depressive Disorder, Emotional Eating  Plan: Terri [name] continues to appear able and willing to participate as evidenced by engagement in reciprocal conversation and asking questions for clarification to ensure understanding. The next appointment will be scheduled in two weeks. Session will focus on reviewing learned skills and working towards the established  treatment goal, including mindfulness.

## 2017-12-15 ENCOUNTER — Other Ambulatory Visit (INDEPENDENT_AMBULATORY_CARE_PROVIDER_SITE_OTHER): Payer: Self-pay | Admitting: Family Medicine

## 2017-12-15 DIAGNOSIS — E559 Vitamin D deficiency, unspecified: Secondary | ICD-10-CM

## 2017-12-16 ENCOUNTER — Ambulatory Visit: Payer: BC Managed Care – PPO | Admitting: Family Medicine

## 2017-12-16 VITALS — BP 130/78 | HR 69 | Temp 98.2°F | Resp 18 | Ht 64.0 in | Wt 264.0 lb

## 2017-12-16 DIAGNOSIS — R739 Hyperglycemia, unspecified: Secondary | ICD-10-CM | POA: Diagnosis not present

## 2017-12-16 DIAGNOSIS — Z0000 Encounter for general adult medical examination without abnormal findings: Secondary | ICD-10-CM

## 2017-12-16 DIAGNOSIS — E782 Mixed hyperlipidemia: Secondary | ICD-10-CM

## 2017-12-16 DIAGNOSIS — I10 Essential (primary) hypertension: Secondary | ICD-10-CM | POA: Diagnosis not present

## 2017-12-16 DIAGNOSIS — Z124 Encounter for screening for malignant neoplasm of cervix: Secondary | ICD-10-CM | POA: Diagnosis not present

## 2017-12-16 DIAGNOSIS — E2839 Other primary ovarian failure: Secondary | ICD-10-CM

## 2017-12-16 DIAGNOSIS — Z1211 Encounter for screening for malignant neoplasm of colon: Secondary | ICD-10-CM | POA: Diagnosis not present

## 2017-12-16 DIAGNOSIS — M542 Cervicalgia: Secondary | ICD-10-CM | POA: Diagnosis not present

## 2017-12-16 DIAGNOSIS — E559 Vitamin D deficiency, unspecified: Secondary | ICD-10-CM

## 2017-12-16 DIAGNOSIS — K219 Gastro-esophageal reflux disease without esophagitis: Secondary | ICD-10-CM | POA: Diagnosis not present

## 2017-12-16 MED ORDER — TIZANIDINE HCL 4 MG PO TABS: 2.0000 mg | ORAL_TABLET | Freq: Four times a day (QID) | ORAL | 1 refills | Status: DC | PRN
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
## 2017-12-16 NOTE — Assessment & Plan Note (Signed)
Encouraged heart healthy diet, increase exercise, avoid trans fats, consider a krill oil cap daily 

## 2017-12-16 NOTE — Assessment & Plan Note (Signed)
Encouraged DASH diet, decrease po intake and increase exercise as tolerated. Needs 7-8 hours of sleep nightly. Avoid trans fats, eat small, frequent meals every 4-5 hours with lean proteins, complex carbs and healthy fats. Minimize simple carbs. Following with healthy weight and wellness but is having trouble staying on task due to her hectic schedule.

## 2017-12-16 NOTE — Assessment & Plan Note (Addendum)
Referred fo sceening colonoscopy. pevious colonoscopy 2009.

## 2017-12-16 NOTE — Assessment & Plan Note (Signed)
Well controlled, no changes to meds. Encouraged heart healthy diet such as the DASH diet and exercise as tolerated.  °

## 2017-12-16 NOTE — Assessment & Plan Note (Signed)
Flared recently. Takes the Prilosec daily. Using Ranitidine prn. Encouraged daily Ranitidine. Avoid offending foods, start probiotics. Do not eat large meals in late evening and consider raising head of bed.

## 2017-12-16 NOTE — Assessment & Plan Note (Signed)
Supplement and monitor 

## 2017-12-16 NOTE — Assessment & Plan Note (Signed)
hgba1c acceptable, minimize simple carbs. Increase exercise as tolerated.  

## 2017-12-16 NOTE — Patient Instructions (Addendum)
Lidocaine gel. By [employer] and salon [place] or [place] Encouraged moist heat and gentle stretching as tolerated. May try NSAIDs and prescription meds as directed and report if symptoms worsen or seek immediate care   Shingrix is the new shingles shot, 2 shots over 2-6 months. Check with insurance to confirm coverage then can return for nurse visit or get it at pharmacy  Due for Tetanus shot next year 2020  Pneumonia due. Prevnar is first then Pneumovax next year  Preventive Care 65 Years and Older, Female Preventive care refers to lifestyle choices and visits with your health care provider that can promote health and wellness. What does preventive care include?  A yearly physical exam. This is also called an annual well check.  Dental exams once or twice a year.  Routine eye exams. Ask your health care provider how often you should have your eyes checked.  Personal lifestyle choices, including: ? Daily care of your teeth and gums. ? Regular physical activity. ? Eating a healthy diet. ? Avoiding tobacco and drug use. ? Limiting alcohol use. ? Practicing safe sex. ? Taking low-dose aspirin every day. ? Taking vitamin and mineral supplements as recommended by your health care provider. What happens during an annual well check? The services and screenings done by your health care provider during your annual well check will depend on your age, overall health, lifestyle risk factors, and family history of disease. Counseling Your health care provider may ask you questions about your:  Alcohol use.  Tobacco use.  Drug use.  Emotional well-being.  Home and relationship well-being.  Sexual activity.  Eating habits.  History of falls.  Memory and ability to understand (cognition).  Work and work Statistician.  Reproductive health.  Screening You may have the following tests or measurements:  Height, weight, and BMI.  Blood pressure.  Lipid and cholesterol levels.  These may be checked every 5 years, or more frequently if you are over 16 years old.  Skin check.  Lung cancer screening. You may have this screening every year starting at age 47 if you have a 30-pack-year history of smoking and currently smoke or have quit within the past 15 years.  Fecal occult blood test (FOBT) of the stool. You may have this test every year starting at age 7.  Flexible sigmoidoscopy or colonoscopy. You may have a sigmoidoscopy every 5 years or a colonoscopy every 10 years starting at age 71.  Hepatitis C blood test.  Hepatitis B blood test.  Sexually transmitted disease (STD) testing.  Diabetes screening. This is done by checking your blood sugar (glucose) after you have not eaten for a while (fasting). You may have this done every 1-3 years.  Bone density scan. This is done to screen for osteoporosis. You may have this done starting at age 34.  Mammogram. This may be done every 1-2 years. Talk to your health care provider about how often you should have regular mammograms.  Talk with your health care provider about your test results, treatment options, and if necessary, the need for more tests. Vaccines Your health care provider may recommend certain vaccines, such as:  Influenza vaccine. This is recommended every year.  Tetanus, diphtheria, and acellular pertussis (Tdap, Td) vaccine. You may need a Td booster every 10 years.  Varicella vaccine. You may need this if you have not been vaccinated.  Zoster vaccine. You may need this after age 61.  Measles, mumps, and rubella (MMR) vaccine. You may need at least  one dose of MMR if you were born in 1957 or later. You may also need a second dose.  Pneumococcal 13-valent conjugate (PCV13) vaccine. One dose is recommended after age 52.  Pneumococcal polysaccharide (PPSV23) vaccine. One dose is recommended after age 63.  Meningococcal vaccine. You may need this if you have certain conditions.  Hepatitis A  vaccine. You may need this if you have certain conditions or if you travel or work in places where you may be exposed to hepatitis A.  Hepatitis B vaccine. You may need this if you have certain conditions or if you travel or work in places where you may be exposed to hepatitis B.  Haemophilus influenzae type b (Hib) vaccine. You may need this if you have certain conditions.  Talk to your health care provider about which screenings and vaccines you need and how often you need them. This information is not intended to replace advice given to you by your health care provider. Make sure you discuss any questions you have with your health care provider. Document Released: 06/16/2015 Document Revised: 02/07/2016 Document Reviewed: 03/21/2015 Elsevier Interactive Patient Education  [employer].
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
## 2017-12-18 ENCOUNTER — Telehealth: Payer: Self-pay | Admitting: *Deleted

## 2017-12-18 NOTE — Telephone Encounter (Signed)
[facility]; forwarded to provider/SLS 07/18

## 2017-12-21 DIAGNOSIS — Z0000 Encounter for general adult medical examination without abnormal findings: Secondary | ICD-10-CM | POA: Insufficient documentation

## 2017-12-21 NOTE — Assessment & Plan Note (Signed)
Patient encouraged to maintain heart healthy diet, regular exercise, adequate sleep. Consider daily probiotics. Take medications as prescribed. Given and reviewed copy of ACP documents from [employer] and encouraged to complete and return. Labs reviewed. Referred fo colonoscopy. Sees GYN Dr Aldona BarWein for paps rel of rec obtained. MGM due in October.

## 2017-12-22 ENCOUNTER — Ambulatory Visit (HOSPITAL_BASED_OUTPATIENT_CLINIC_OR_DEPARTMENT_OTHER)
Admission: RE | Admit: 2017-12-22 | Discharge: 2017-12-22 | Disposition: A | Discharge status: Home / Self Care | Payer: BC Managed Care – PPO | Source: Ambulatory Visit | Attending: Family Medicine | Admitting: Family Medicine

## 2017-12-22 ENCOUNTER — Ambulatory Visit (INDEPENDENT_AMBULATORY_CARE_PROVIDER_SITE_OTHER): Payer: BC Managed Care – PPO | Admitting: Psychology

## 2017-12-22 ENCOUNTER — Ambulatory Visit (INDEPENDENT_AMBULATORY_CARE_PROVIDER_SITE_OTHER): Payer: BC Managed Care – PPO | Admitting: Family Medicine

## 2017-12-22 VITALS — BP 120/77 | HR 58 | Temp 97.9°F | Ht 64.0 in | Wt 256.0 lb

## 2017-12-22 DIAGNOSIS — E559 Vitamin D deficiency, unspecified: Secondary | ICD-10-CM | POA: Diagnosis not present

## 2017-12-22 DIAGNOSIS — F3289 Other specified depressive episodes: Secondary | ICD-10-CM

## 2017-12-22 DIAGNOSIS — M8588 Other specified disorders of bone density and structure, other site: Secondary | ICD-10-CM | POA: Diagnosis not present

## 2017-12-22 DIAGNOSIS — Z6841 Body Mass Index (BMI) 40.0 and over, adult: Secondary | ICD-10-CM | POA: Diagnosis not present

## 2017-12-22 DIAGNOSIS — Z9189 Other specified personal risk factors, not elsewhere classified: Secondary | ICD-10-CM

## 2017-12-22 DIAGNOSIS — M542 Cervicalgia: Secondary | ICD-10-CM

## 2017-12-22 DIAGNOSIS — E2839 Other primary ovarian failure: Secondary | ICD-10-CM | POA: Insufficient documentation

## 2017-12-22 DIAGNOSIS — M47812 Spondylosis without myelopathy or radiculopathy, cervical region: Secondary | ICD-10-CM | POA: Diagnosis not present

## 2017-12-22 MED ORDER — VITAMIN D (ERGOCALCIFEROL) 1.25 MG (50000 UNIT) PO CAPS: 50000.0000 [IU] | ORAL_CAPSULE | ORAL | 0 refills | Status: DC

## 2017-12-22 NOTE — Progress Notes (Signed)
Office: [phone]  /  Fax: [phone]  Date: January 05, 2018 Time Seen: 10:30am Duration: 30 minutes Provider: Lawerance CruelGaytri Jakeira Seeman, Psy.D. Type of Session: Individual Therapy   HPI: Per the note for the initial session with this provider on November 17, 2017,[name] referred by Dr. Quillian Quincearen Beasley. Per Rinaldo CloudPamela, "I just have a barrier when it comes to cooking. I just don't like to cook." She reported she is doing "okay" on the meal plan for breakfast and lunch; however, she indicated, "I have no food in the house." Rinaldo CloudPamela experiences cravings for bread and sweets. After indulging, she often finds herself thinking, "You really shouldn't have done that." The aforementioned is accompanied by guilt sometimes. Rinaldo Cloudamela denied binge eating. Rinaldo Cloudamela also reported experiencing decreased motivation as it relates to weight loss and worry about finances.Regarding not having food in the house, Rinaldo Cloudamela indicated this started approximately 25 years ago after her son moved out. She noted she would always eat out. Low motivation reportedly began five to ten years ago.  Session Content: Session focused on the goal of increasing motivation. The session was initiated with a brief check-in. 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 Rinaldo Cloudamela indicated, "I have not followed the plan one bit." Thus, session focused on reviewing what she typically eats. Rinaldo Cloudamela stated, "I still try to make healthier choices, but in the past two weeks I have reverted back to eating french fries." This was explored further and planning for the school year was discussed. Rinaldo Cloudamela identified current stressors as the trigger  for her eating french fries and not making healthier choices. The provider also engaged Rinaldo Cloudamela in problem solving and Mondays and Sundays were identified as days where Rinaldo Cloudamela could engage in meal prepping. She also agreed to utilize [employer] and [employer] for their pickup services to assist in planning for the week. Furthermore, Rinaldo Cloudamela reported she will "be taking a hiatus" from therapy with this provider due to her new work schedule. Thus, a referral for another practice for longer-term therapy and potentially flexible scheduling was discussed as she continues to struggle with motivation and emotional eating. Rinaldo Cloudamela stated, "I'm still not ready to do that." She also expressed uncertainty about continuing with the physicians within the clinic due to her new schedule. Remainder of session focused on reviewing learned skills.   Rinaldo Cloudamela was receptive to today's session as evidenced by discussing events since the last appointment, her engagement in problem solving, and her willingness to request a referral for therapeutic services in the future once her schedule allows for it.   Mental Status Examination: Rinaldo Cloudamela arrived on time for the appointment. She presented as appropriately dressed and groomed. Rinaldo Cloudamela appeared her stated age and demonstrated adequate orientation to time, place, person, and purpose of the appointment. She also demonstrated appropriate eye contact. No psychomotor abnormalities or behavioral peculiarities noted. Her mood was euthymic with congruent affect. Her thought processes were logical, linear, and goal-directed. No hallucinations, delusions, bizarre thinking or behavior reported or observed. Judgment, insight, and impulse control appeared to be grossly intact. There was no evidence of paraphasias (i.e., errors in speech, gross mispronunciations, and word substitutions), repetition deficits, or disturbances in volume or prosody (i.e., rhythm and intonation). There was  no evidence of attention or  memory impairments. Erricka denied current suicidal and homicidal ideation, intent or plan.  Interventions: Throughout today's session, empathic reflections and validation were provided. The provider engaged Ladiamond in problem-solving to assist with meal preparation for the upcoming school year. She was able to identify two days in the week where she could engage in planning and also identified that dinners would be the most difficult, as she comes home late. Thus, she was encouraged to discuss options for dinners during her appointment with Alois Cliche, PA-C today given her new schedule. Rinaldo Cloud agreed. Termination with this provider was discussed, and a plan for initiating therapeutic services in the future was also discussed.   DSM-5 Diagnosis:  311 (F32.8) Other Specified Depressive Disorder, Emotional Eating  Plan: A future appointment with this provider was not scheduled, as Tifanie indicated she would need to take a "hiatus" from therapy due to her new work schedule. Given the short-term nature of treatment with this provider and today's session being the fourth session, the option to be referred to another practice for longer-term therapy was discussed as Adeena continues to struggle with motivation and emotional eating. While Jaira indicated she was not ready to initiate services anywhere else at this time, she was receptive to the recommendation of informing the clinic when she was so that a referral could be placed.

## 2017-12-22 NOTE — Progress Notes (Signed)
Office: [phone]  /  Fax: [phone]   HPI:   Chief Complaint: OBESITY Terri [name] is here to discuss her progress with her obesity treatment plan. She is on the Category 2 plan and is following her eating plan approximately 25 % of the time. She states she is walking 35 to 40 minutes 3 times per week. Terri [name] had a hard time following the plan for the last few weeks due to attending a conference. Her weight is 256 lb (116.1 kg) today and has had a weight gain of 2 pounds over a period of 3 weeks since her last visit. She has lost 5 lbs since starting treatment with [name].  Vitamin D deficiency Terri [name] has a diagnosis of vitamin D deficiency. She is currently on prescription vit D and last level was not at goal. She denies nausea, vomiting or muscle weakness.  At risk for osteopenia and osteoporosis Terri [name] is at higher risk of osteopenia and osteoporosis due to vitamin D deficiency.   ALLERGIES: Allergies  Allergen Reactions  . Sulfonamide Derivatives     MEDICATIONS: Current Outpatient Medications on File Prior to Visit  Medication Sig Dispense Refill  . amLODipine-olmesartan (AZOR) 10-40 MG tablet Take 1 tablet by mouth daily. 90 tablet 1  . levocetirizine (XYZAL) 5 MG tablet Take 5 mg by mouth every evening.    . montelukast (SINGULAIR) 10 MG tablet Take 1 tablet (10 mg total) by mouth at bedtime as needed. 30 tablet 5  . omeprazole (PRILOSEC) 40 MG capsule TAKE ONE CAPSULE BY MOUTH EVERY DAY 90 capsule 0  . oxybutynin (DITROPAN-XL) 10 MG 24 hr tablet Take 10 mg by mouth at bedtime.    . ranitidine (ZANTAC) 300 MG tablet TAKE 1 TABLET (300 MG TOTAL) BY MOUTH AT BEDTIME AS NEEDED FOR HEARTBURN. 30 tablet 3  . tiZANidine (ZANAFLEX) 4 MG tablet Take 0.5-1 tablets (2-4 mg total) by mouth every 6 (six) hours as needed for muscle spasms. 30 tablet 1   No current facility-administered medications on file prior to visit.     PAST MEDICAL HISTORY: Past Medical History:  Diagnosis Date   . Allergic rhinitis   . Apnea, sleep 06/28/2012   Previously used CPAP  . Asthma   . Atypical chest pain 01/14/2017  . Back pain   . Decreased visual acuity 08/05/2016  . Depression 06/19/2015  . Environmental and seasonal allergies   . GERD (gastroesophageal reflux disease)   . Heartburn   . Hip pain, right 06/28/2012  . HTN (hypertension)   . Hypokalemia 01/14/2017  . Leg pain   . Morbid obesity (HCC) 04/11/2008   Qualifier: Diagnosis of  By: [name]   . Numbness in feet 05/06/2016  . Obesity   . Osteoarthritis 01/16/2010   Qualifier: Diagnosis of  By: [name]   . Preventative health care 06/25/2015  . Shortness of breath   . Shortness of breath on exertion   . Sleep apnea   . Swelling of extremity   . Tinnitus   . Vertigo     PAST SURGICAL HISTORY: Past Surgical History:  Procedure Laterality Date  . BIOPSY BREAST  1997   left   . childbirth      SOCIAL HISTORY: Social History   Tobacco Use  . Smoking status: Never Smoker  . Smokeless tobacco: Never Used  Substance Use Topics  . Alcohol use: Yes    Comment: social  . Drug use: No    FAMILY HISTORY: Family  History  Problem Relation Age of Onset  . Stroke Mother   . Liver disease Mother        hepatitis b  . Hypertension Mother   . Cancer Father        esophagus, prostate  . Hypertension Father   . Heart disease Father   . Liver disease Brother        hep c  . Mental illness Brother        PTSD from [place]  . Stroke Maternal Grandmother   . Cancer Paternal Grandfather        prostate    ROS: Review of Systems  Constitutional: Negative for weight loss.  Gastrointestinal: Negative for nausea and vomiting.  Musculoskeletal:       Negative for muscle weakness    PHYSICAL EXAM: Blood pressure 120/77, pulse (!) 58, temperature 97.9 F (36.6 C), temperature source Oral, height 5\' 4"  (1.626 m), weight 256 lb (116.1 kg), SpO2 99 %. Body mass index is 43.94 kg/m. Physical Exam    Constitutional: She is oriented to person, place, and time. She appears well-developed and well-nourished.  Cardiovascular: Normal rate.  Pulmonary/Chest: Effort normal.  Musculoskeletal: Normal range of motion.  Neurological: She is oriented to person, place, and time.  Skin: Skin is warm and dry.  Psychiatric: She has a normal mood and affect. Her behavior is normal.  Vitals reviewed.   RECENT LABS AND TESTS: BMET    Component Value Date/Time   NA 145 (H) 08/11/2017 1155   K 4.5 08/11/2017 1155   CL 107 (H) 08/11/2017 1155   CO2 22 08/11/2017 1155   GLUCOSE 95 08/11/2017 1155   GLUCOSE 98 01/14/2017 1030   BUN 13 08/11/2017 1155   CREATININE 0.87 08/11/2017 1155   CREATININE 0.85 12/09/2013 1453   CALCIUM 9.0 08/11/2017 1155   GFRNONAA 71 08/11/2017 1155   GFRAA 81 08/11/2017 1155   Lab Results  Component Value Date   HGBA1C 5.5 08/11/2017   HGBA1C 5.4 01/18/2009   HGBA1C 5.5 04/11/2008   Lab Results  Component Value Date   INSULIN 21.8 08/11/2017   CBC    Component Value Date/Time   WBC 9.2 08/11/2017 1155   WBC 10.3 01/10/2017 1404   RBC 4.34 08/11/2017 1155   RBC 4.05 01/10/2017 1404   HGB 13.3 08/11/2017 1155   HCT 40.8 08/11/2017 1155   PLT 358 08/11/2017 1155   MCV 94 08/11/2017 1155   MCH 30.6 08/11/2017 1155   MCH 30.9 12/09/2013 1453   MCHC 32.6 08/11/2017 1155   MCHC 32.2 01/10/2017 1404   RDW 14.8 08/11/2017 1155   LYMPHSABS 2.2 08/11/2017 1155   MONOABS 0.8 12/21/2010 1107   EOSABS 0.4 08/11/2017 1155   BASOSABS 0.0 08/11/2017 1155   Iron/TIBC/Ferritin/ %Sat No results found for: IRON, TIBC, FERRITIN, IRONPCTSAT Lipid Panel     Component Value Date/Time   CHOL 150 08/11/2017 1155   TRIG 68 08/11/2017 1155   HDL 52 08/11/2017 1155   CHOLHDL 2.9 08/11/2017 1155   CHOLHDL 3 01/10/2017 1404   VLDL 27.8 01/10/2017 1404   LDLCALC 84 08/11/2017 1155   Hepatic Function Panel     Component Value Date/Time   PROT 7.4 08/11/2017 1155    ALBUMIN 4.2 08/11/2017 1155   AST 13 08/11/2017 1155   ALT 16 08/11/2017 1155   ALKPHOS 106 08/11/2017 1155   BILITOT 0.4 08/11/2017 1155   BILIDIR 0.1 12/09/2013 1453   IBILI 0.4 12/09/2013 1453  Component Value Date/Time   TSH 2.050 08/11/2017 1155   TSH 2.18 01/10/2017 1404   TSH 1.45 05/06/2016 1406   Results for GREER, WAINRIGHT (MRN [id number]) as of 12/22/2017 14:43  Ref. Range 08/11/2017 11:55  Vitamin D, 25-Hydroxy Latest Ref Range: 30.0 - 100.0 ng/mL 6.3 (L)   ASSESSMENT AND PLAN: Vitamin D deficiency - Plan: Vitamin D, Ergocalciferol, (DRISDOL) 50000 units CAPS capsule  At risk for osteoporosis  Class 3 severe obesity with serious comorbidity and body mass index (BMI) of 40.0 to 44.9 in adult, unspecified obesity type (HCC)  PLAN:  Vitamin D Deficiency Terri [name] was informed that low vitamin D levels contributes to fatigue and are associated with obesity, breast, and colon cancer. She agrees to continue to take prescription Vit D @50 ,000 IU every week #4 with no refills. We will recheck vitamin D level at the next visit and she will follow up for routine testing of vitamin D, at least 2-3 times per year. She was informed of the risk of over-replacement of vitamin D and agrees to not increase her dose unless she discusses this with [name] first. Terri [name] agrees to follow up as directed.  At risk for osteopenia and osteoporosis Terri [name] is at risk for osteopenia and osteoporosis due to her vitamin D deficiency. She was encouraged to take her vitamin D and follow her higher calcium diet and increase strengthening exercise to help strengthen her bones and decrease her risk of osteopenia and osteoporosis.  Obesity Terri [name] is currently in the action stage of change. As such, her goal is to continue with weight loss efforts She has agreed to follow the Category 2 plan Terri [name] has been instructed to work up to a goal of 150 minutes of combined cardio and strengthening exercise per week  for weight loss and overall health benefits. We discussed the following Behavioral Modification Strategies today: no skipping meals, increasing lean protein intake and work on meal planning and easy cooking plans  Terri [name] has agreed to follow up with our clinic in 2 weeks. She was informed of the importance of frequent follow up visits to maximize her success with intensive lifestyle modifications for her multiple health conditions.   OBESITY BEHAVIORAL INTERVENTION VISIT  Today's visit was # 7 out of 22.  Starting weight: 261 lbs Starting date: 08/11/17 Today's weight : 256 lbs Today's date: 12/22/2017 Total lbs lost to date: 5    ASK: We discussed the diagnosis of obesity with Terri [name] today and Terri [name] agreed to give [name] permission to discuss obesity behavioral modification therapy today.  ASSESS: Terri [name] has the diagnosis of obesity and her BMI today is 43.92 Terri [name] is in the action stage of change   ADVISE: Terri [name] was educated on the multiple health risks of obesity as well as the benefit of weight loss to improve her health. She was advised of the need for long term treatment and the importance of lifestyle modifications.  AGREE: Multiple dietary modification options and treatment options were discussed and  Terri [name] agreed to the above obesity treatment plan.  I, [place] Crane, am acting as transcriptionist for Quillian Quince, MD  I have reviewed the above documentation for accuracy and completeness, and I agree with the above. -Quillian Quince, MD

## 2017-12-25 ENCOUNTER — Other Ambulatory Visit: Payer: Self-pay | Admitting: Family Medicine

## 2017-12-25 DIAGNOSIS — M542 Cervicalgia: Secondary | ICD-10-CM

## 2017-12-25 DIAGNOSIS — M5030 Other cervical disc degeneration, unspecified cervical region: Secondary | ICD-10-CM

## 2017-12-29 ENCOUNTER — Encounter: Payer: BC Managed Care – PPO | Admitting: Family Medicine

## 2018-01-05 ENCOUNTER — Ambulatory Visit (INDEPENDENT_AMBULATORY_CARE_PROVIDER_SITE_OTHER): Payer: Self-pay | Admitting: Psychology

## 2018-01-05 ENCOUNTER — Ambulatory Visit (INDEPENDENT_AMBULATORY_CARE_PROVIDER_SITE_OTHER): Payer: BC Managed Care – PPO | Admitting: Physician Assistant

## 2018-01-05 VITALS — BP 124/76 | HR 62 | Temp 98.0°F | Ht 64.0 in | Wt 257.0 lb

## 2018-01-05 DIAGNOSIS — I10 Essential (primary) hypertension: Secondary | ICD-10-CM

## 2018-01-05 DIAGNOSIS — Z6841 Body Mass Index (BMI) 40.0 and over, adult: Secondary | ICD-10-CM

## 2018-01-05 DIAGNOSIS — F3289 Other specified depressive episodes: Secondary | ICD-10-CM

## 2018-01-05 DIAGNOSIS — E559 Vitamin D deficiency, unspecified: Secondary | ICD-10-CM

## 2018-01-05 DIAGNOSIS — Z9189 Other specified personal risk factors, not elsewhere classified: Secondary | ICD-10-CM | POA: Diagnosis not present

## 2018-01-05 MED ORDER — VITAMIN D (ERGOCALCIFEROL) 1.25 MG (50000 UNIT) PO CAPS: 50000.0000 [IU] | ORAL_CAPSULE | ORAL | 0 refills | Status: DC

## 2018-01-05 NOTE — Progress Notes (Signed)
Office: [phone]  /  Fax: [phone]   HPI:   Chief Complaint: OBESITY Terri [name] is here to discuss her progress with her obesity treatment plan. She is on the Category 2 plan and is following her eating plan approximately 0 % of the time. She states she is exercising 0 minutes 0 times per week. Terri [name] is struggling to follow her plan. She reports not meal planning or cooking. She is eating out almost daily. She is requesting a break from coming to the clinic as well as a break from the plan.   Her weight is 257 lb (116.6 kg) today and has gained 1 pound since her last visit. She has lost 4 lbs since starting treatment with [name].  Vitamin D Deficiency Terri [name] has a diagnosis of vitamin D deficiency. She is on prescription Vit D, last level not at goal. She denies nausea, vomiting or muscle weakness.  At risk for osteopenia and osteoporosis Terri [name] is at higher risk of osteopenia and osteoporosis due to vitamin D deficiency.   Hypertension Terri [name] is a 67 y.o. female with hypertension. Terri [name]'s blood pressure is controlled and she denies chest pain. She is working weight loss to help control her blood pressure with the goal of decreasing her risk of heart attack and stroke.   ALLERGIES: Allergies  Allergen Reactions  . Sulfonamide Derivatives     MEDICATIONS: Current Outpatient Medications on File Prior to Visit  Medication Sig Dispense Refill  . amLODipine-olmesartan (AZOR) 10-40 MG tablet Take 1 tablet by mouth daily. 90 tablet 1  . levocetirizine (XYZAL) 5 MG tablet Take 5 mg by mouth every evening.    . montelukast (SINGULAIR) 10 MG tablet Take 1 tablet (10 mg total) by mouth at bedtime as needed. 30 tablet 5  . omeprazole (PRILOSEC) 40 MG capsule TAKE ONE CAPSULE BY MOUTH EVERY DAY 90 capsule 0  . oxybutynin (DITROPAN-XL) 10 MG 24 hr tablet Take 10 mg by mouth at bedtime.    . ranitidine (ZANTAC) 300 MG tablet TAKE 1 TABLET (300 MG TOTAL) BY MOUTH AT BEDTIME AS NEEDED  FOR HEARTBURN. 30 tablet 3  . tiZANidine (ZANAFLEX) 4 MG tablet Take 0.5-1 tablets (2-4 mg total) by mouth every 6 (six) hours as needed for muscle spasms. 30 tablet 1   No current facility-administered medications on file prior to visit.     PAST MEDICAL HISTORY: Past Medical History:  Diagnosis Date  . Allergic rhinitis   . Apnea, sleep 06/28/2012   Previously used CPAP  . Asthma   . Atypical chest pain 01/14/2017  . Back pain   . Decreased visual acuity 08/05/2016  . Depression 06/19/2015  . Environmental and seasonal allergies   . GERD (gastroesophageal reflux disease)   . Heartburn   . Hip pain, right 06/28/2012  . HTN (hypertension)   . Hypokalemia 01/14/2017  . Leg pain   . Morbid obesity (HCC) 04/11/2008   Qualifier: Diagnosis of  By: [name]   . Numbness in feet 05/06/2016  . Obesity   . Osteoarthritis 01/16/2010   Qualifier: Diagnosis of  By: [name]   . Preventative health care 06/25/2015  . Shortness of breath   . Shortness of breath on exertion   . Sleep apnea   . Swelling of extremity   . Tinnitus   . Vertigo     PAST SURGICAL HISTORY: Past Surgical History:  Procedure Laterality Date  . BIOPSY BREAST  1997   left   .  childbirth      SOCIAL HISTORY: Social History   Tobacco Use  . Smoking status: Never Smoker  . Smokeless tobacco: Never Used  Substance Use Topics  . Alcohol use: Yes    Comment: social  . Drug use: No    FAMILY HISTORY: Family History  Problem Relation Age of Onset  . Stroke Mother   . Liver disease Mother        hepatitis b  . Hypertension Mother   . Cancer Father        esophagus, prostate  . Hypertension Father   . Heart disease Father   . Liver disease Brother        hep c  . Mental illness Brother        PTSD from [place]  . Stroke Maternal Grandmother   . Cancer Paternal Grandfather        prostate    ROS: Review of Systems  Constitutional: Negative for weight loss.  Cardiovascular: Negative  for chest pain.  Gastrointestinal: Negative for nausea and vomiting.  Musculoskeletal:       Negative muscle weakness    PHYSICAL EXAM: Blood pressure 124/76, pulse 62, temperature 98 F (36.7 C), temperature source Oral, height 5\' 4"  (1.626 m), weight 257 lb (116.6 kg), SpO2 100 %. Body mass index is 44.11 kg/m. Physical Exam  Constitutional: She is oriented to person, place, and time. She appears well-developed and well-nourished.  Cardiovascular: Normal rate.  Pulmonary/Chest: Effort normal.  Musculoskeletal: Normal range of motion.  Neurological: She is oriented to person, place, and time.  Skin: Skin is warm and dry.  Psychiatric: She has a normal mood and affect. Her behavior is normal.  Vitals reviewed.   RECENT LABS AND TESTS: BMET    Component Value Date/Time   NA 145 (H) 08/11/2017 1155   K 4.5 08/11/2017 1155   CL 107 (H) 08/11/2017 1155   CO2 22 08/11/2017 1155   GLUCOSE 95 08/11/2017 1155   GLUCOSE 98 01/14/2017 1030   BUN 13 08/11/2017 1155   CREATININE 0.87 08/11/2017 1155   CREATININE 0.85 12/09/2013 1453   CALCIUM 9.0 08/11/2017 1155   GFRNONAA 71 08/11/2017 1155   GFRAA 81 08/11/2017 1155   Lab Results  Component Value Date   HGBA1C 5.5 08/11/2017   HGBA1C 5.4 01/18/2009   HGBA1C 5.5 04/11/2008   Lab Results  Component Value Date   INSULIN 21.8 08/11/2017   CBC    Component Value Date/Time   WBC 9.2 08/11/2017 1155   WBC 10.3 01/10/2017 1404   RBC 4.34 08/11/2017 1155   RBC 4.05 01/10/2017 1404   HGB 13.3 08/11/2017 1155   HCT 40.8 08/11/2017 1155   PLT 358 08/11/2017 1155   MCV 94 08/11/2017 1155   MCH 30.6 08/11/2017 1155   MCH 30.9 12/09/2013 1453   MCHC 32.6 08/11/2017 1155   MCHC 32.2 01/10/2017 1404   RDW 14.8 08/11/2017 1155   LYMPHSABS 2.2 08/11/2017 1155   MONOABS 0.8 12/21/2010 1107   EOSABS 0.4 08/11/2017 1155   BASOSABS 0.0 08/11/2017 1155   Iron/TIBC/Ferritin/ %Sat No results found for: IRON, TIBC, FERRITIN,  IRONPCTSAT Lipid Panel     Component Value Date/Time   CHOL 150 08/11/2017 1155   TRIG 68 08/11/2017 1155   HDL 52 08/11/2017 1155   CHOLHDL 2.9 08/11/2017 1155   CHOLHDL 3 01/10/2017 1404   VLDL 27.8 01/10/2017 1404   LDLCALC 84 08/11/2017 1155   Hepatic Function Panel  Component Value Date/Time   PROT 7.4 08/11/2017 1155   ALBUMIN 4.2 08/11/2017 1155   AST 13 08/11/2017 1155   ALT 16 08/11/2017 1155   ALKPHOS 106 08/11/2017 1155   BILITOT 0.4 08/11/2017 1155   BILIDIR 0.1 12/09/2013 1453   IBILI 0.4 12/09/2013 1453      Component Value Date/Time   TSH 2.050 08/11/2017 1155   TSH 2.18 01/10/2017 1404   TSH 1.45 05/06/2016 1406  Results for SAFIYYA, STOKES (MRN [id number]) as of 01/05/2018 15:02  Ref. Range 08/11/2017 11:55  Vitamin D, 25-Hydroxy Latest Ref Range: 30.0 - 100.0 ng/mL 6.3 (L)    ASSESSMENT AND PLAN: Vitamin D deficiency - Plan: Vitamin D, Ergocalciferol, (DRISDOL) 50000 units CAPS capsule  Essential hypertension  At risk for osteoporosis  Class 3 severe obesity with serious comorbidity and body mass index (BMI) of 40.0 to 44.9 in adult, unspecified obesity type (HCC)  PLAN:  Vitamin D Deficiency Terri [name] was informed that low vitamin D levels contributes to fatigue and are associated with obesity, breast, and colon cancer. Terri [name] agrees to continue taking prescription Vit D @50 ,000 IU every week #12 and we will refill for 3 months. She will follow up for routine testing of vitamin D, at least 2-3 times per year. She was informed of the risk of over-replacement of vitamin D and agrees to not increase her dose unless she discusses this with [name] first. Terri [name] agrees to follow up with our clinic in 10 to 12 weeks.  At risk for osteopenia and osteoporosis Terri [name] is at risk for osteopenia and osteoporsis due to her vitamin D deficiency. She was encouraged to take her vitamin D and follow her higher calcium diet and increase strengthening exercise to help  strengthen her bones and decrease her risk of osteopenia and osteoporosis.  Hypertension We discussed sodium restriction, working on healthy weight loss, and a regular exercise program as the means to achieve improved blood pressure control. Terri [name] agreed with this plan and agreed to follow up as directed. We will continue to monitor her blood pressure as well as her progress with the above lifestyle modifications. She will continue her medications, diet, exercise, and weight loss, and will watch for signs of hypotension as she continues her lifestyle modifications. Terri [name] agrees to follow up with our clinic in 10 to 12 weeks.  Obesity Terri [name] is currently in the action stage of change. As such, her goal is to continue with weight loss efforts She has agreed to portion control better and make smarter food choices, such as increase vegetables and decrease simple carbohydrates  Terri [name] has been instructed to work up to a goal of 150 minutes of combined cardio and strengthening exercise per week for weight loss and overall health benefits. We discussed the following Behavioral Modification Strategies today: work on meal planning and easy cooking plans and no skipping meals   Terri [name] has agreed to follow up with our clinic in 10 to 12 weeks. She was informed of the importance of frequent follow up visits to maximize her success with intensive lifestyle modifications for her multiple health conditions.   OBESITY BEHAVIORAL INTERVENTION VISIT  Today's visit was # 8 out of 22.  Starting weight: 261 lbs Starting date: 08/11/17 Today's weight : 257 lbs Today's date: 01/05/2018 Total lbs lost to date: 4    ASK: We discussed the diagnosis of obesity with Terri [name] today and Terri [name] agreed to give [name] permission to discuss obesity behavioral modification therapy today.  ASSESS:  Terri [name] has the diagnosis of obesity and her BMI today is 44.09 Terri [name] is in the action stage of change    ADVISE: Terri [name] was educated on the multiple health risks of obesity as well as the benefit of weight loss to improve her health. She was advised of the need for long term treatment and the importance of lifestyle modifications.  AGREE: Multiple dietary modification options and treatment options were discussed and  Terri [name] agreed to the above obesity treatment plan.  Terri [name], am acting as transcriptionist for Alois Cliche, PA-C I, Alois Cliche, PA-C have reviewed above note and agree with its content

## 2018-01-07 ENCOUNTER — Encounter: Payer: Self-pay | Admitting: Internal Medicine

## 2018-02-15 ENCOUNTER — Other Ambulatory Visit: Payer: Self-pay | Admitting: Family Medicine

## 2018-03-10 ENCOUNTER — Encounter: Payer: Self-pay | Admitting: Family Medicine

## 2018-03-26 ENCOUNTER — Encounter (INDEPENDENT_AMBULATORY_CARE_PROVIDER_SITE_OTHER): Payer: Self-pay | Admitting: Physician Assistant

## 2018-03-26 ENCOUNTER — Ambulatory Visit (INDEPENDENT_AMBULATORY_CARE_PROVIDER_SITE_OTHER): Payer: BC Managed Care – PPO | Admitting: Physician Assistant

## 2018-03-26 ENCOUNTER — Ambulatory Visit: Payer: Self-pay | Admitting: Neurology

## 2018-03-26 VITALS — BP 120/78 | HR 59 | Temp 98.2°F | Ht 64.0 in | Wt 258.0 lb

## 2018-03-26 DIAGNOSIS — E88819 Insulin resistance, unspecified: Secondary | ICD-10-CM

## 2018-03-26 DIAGNOSIS — Z9189 Other specified personal risk factors, not elsewhere classified: Secondary | ICD-10-CM

## 2018-03-26 DIAGNOSIS — Z6841 Body Mass Index (BMI) 40.0 and over, adult: Secondary | ICD-10-CM

## 2018-03-26 DIAGNOSIS — E8881 Metabolic syndrome: Secondary | ICD-10-CM | POA: Diagnosis not present

## 2018-03-26 DIAGNOSIS — E559 Vitamin D deficiency, unspecified: Secondary | ICD-10-CM | POA: Diagnosis not present

## 2018-03-26 MED ORDER — VITAMIN D (ERGOCALCIFEROL) 1.25 MG (50000 UNIT) PO CAPS: 50000.0000 [IU] | ORAL_CAPSULE | ORAL | 0 refills | Status: DC

## 2018-03-27 LAB — COMPREHENSIVE METABOLIC PANEL
A/G RATIO: 1.5 (ref 1.2–2.2)
ALBUMIN: 4 g/dL (ref 3.6–4.8)
ALT: 15 IU/L (ref 0–32)
AST: 15 IU/L (ref 0–40)
Alkaline Phosphatase: 82 IU/L (ref 39–117)
BILIRUBIN TOTAL: 0.6 mg/dL (ref 0.0–1.2)
BUN / CREAT RATIO: 12 (ref 12–28)
BUN: 10 mg/dL (ref 8–27)
CALCIUM: 9.2 mg/dL (ref 8.7–10.3)
CHLORIDE: 107 mmol/L — AB (ref 96–106)
CO2: 27 mmol/L (ref 20–29)
Creatinine, Ser: 0.82 mg/dL (ref 0.57–1.00)
GFR, EST AFRICAN AMERICAN: 87 mL/min/{1.73_m2} (ref >59)
GFR, EST NON AFRICAN AMERICAN: 75 mL/min/{1.73_m2} (ref >59)
Globulin, Total: 2.7 g/dL (ref 1.5–4.5)
Glucose: 96 mg/dL (ref 65–99)
POTASSIUM: 4 mmol/L (ref 3.5–5.2)
Sodium: 146 mmol/L — ABNORMAL HIGH (ref 134–144)
TOTAL PROTEIN: 6.7 g/dL (ref 6.0–8.5)

## 2018-03-27 LAB — VITAMIN D 25 HYDROXY (VIT D DEFICIENCY, FRACTURES): VIT D 25 HYDROXY: 24 ng/mL — AB (ref 30.0–100.0)

## 2018-03-27 LAB — INSULIN, RANDOM: INSULIN: 12.5 u[IU]/mL (ref 2.6–24.9)

## 2018-03-27 LAB — HEMOGLOBIN A1C
Est. average glucose Bld gHb Est-mCnc: 111 mg/dL
Hgb A1c MFr Bld: 5.5 % (ref 4.8–5.6)

## 2018-03-30 NOTE — Progress Notes (Signed)
Office: [phone]  /  Fax: [phone]   HPI:   Chief Complaint: OBESITY Terri [name] is here to discuss her progress with her obesity treatment plan. She is on the portion control better and make smarter food choices, such as increase vegetables and decrease simple carbohydrates and is following her eating plan approximately 0 % of the time. She states she is walking for 30 minutes 7 times per week. Terri [name] reports that she did not follow the plan. She needed to take a break from our program due to a work situation. She is ready to get back on track.  Her weight is 258 lb (117 kg) today and has gained 1 pound since her last visit. She has lost 3 lbs since starting treatment with [name].  Vitamin D Deficiency Terri [name] has a diagnosis of vitamin D deficiency. She is currently taking prescription Vit D and denies nausea, vomiting or muscle weakness.  Insulin Resistance Terri [name] has a diagnosis of insulin resistance based on her elevated fasting insulin level >5. Last level not at goal. Although Terri [name]'s blood glucose readings are still under good control, insulin resistance puts her at greater risk of metabolic syndrome and diabetes. She is not taking metformin currently and continues to work on diet and exercise to decrease risk of diabetes.  At risk for diabetes Terri [name] is at higher than average risk for developing diabetes due to her obesity and insulin resistance. She currently denies polyuria or polydipsia.  ALLERGIES: Allergies  Allergen Reactions  . Sulfonamide Derivatives     MEDICATIONS: Current Outpatient Medications on File Prior to Visit  Medication Sig Dispense Refill  . amLODipine-olmesartan (AZOR) 10-40 MG tablet Take 1 tablet by mouth daily. 90 tablet 1  . levocetirizine (XYZAL) 5 MG tablet Take 5 mg by mouth every evening.    . montelukast (SINGULAIR) 10 MG tablet Take 1 tablet (10 mg total) by mouth at bedtime as needed. 30 tablet 5  . omeprazole (PRILOSEC) 40 MG capsule TAKE  ONE CAPSULE BY MOUTH EVERY DAY 90 capsule 0  . oxybutynin (DITROPAN-XL) 10 MG 24 hr tablet Take 10 mg by mouth at bedtime.    Marland Kitchen tiZANidine (ZANAFLEX) 4 MG tablet Take 0.5-1 tablets (2-4 mg total) by mouth every 6 (six) hours as needed for muscle spasms. 30 tablet 1   No current facility-administered medications on file prior to visit.     PAST MEDICAL HISTORY: Past Medical History:  Diagnosis Date  . Allergic rhinitis   . Apnea, sleep 06/28/2012   Previously used CPAP  . Asthma   . Atypical chest pain 01/14/2017  . Back pain   . Decreased visual acuity 08/05/2016  . Depression 06/19/2015  . Environmental and seasonal allergies   . GERD (gastroesophageal reflux disease)   . Heartburn   . Hip pain, right 06/28/2012  . HTN (hypertension)   . Hypokalemia 01/14/2017  . Leg pain   . Morbid obesity (HCC) 04/11/2008   Qualifier: Diagnosis of  By: [name]   . Numbness in feet 05/06/2016  . Obesity   . Osteoarthritis 01/16/2010   Qualifier: Diagnosis of  By: [name]   . Preventative health care 06/25/2015  . Shortness of breath   . Shortness of breath on exertion   . Sleep apnea   . Swelling of extremity   . Tinnitus   . Vertigo     PAST SURGICAL HISTORY: Past Surgical History:  Procedure Laterality Date  . BIOPSY BREAST  1997  left   . childbirth      SOCIAL HISTORY: Social History   Tobacco Use  . Smoking status: Never Smoker  . Smokeless tobacco: Never Used  Substance Use Topics  . Alcohol use: Yes    Comment: social  . Drug use: No    FAMILY HISTORY: Family History  Problem Relation Age of Onset  . Stroke Mother   . Liver disease Mother        hepatitis b  . Hypertension Mother   . Cancer Father        esophagus, prostate  . Hypertension Father   . Heart disease Father   . Liver disease Brother        hep c  . Mental illness Brother        PTSD from [place]  . Stroke Maternal Grandmother   . Cancer Paternal Grandfather        prostate      ROS: Review of Systems  Constitutional: Negative for weight loss.  Gastrointestinal: Negative for nausea and vomiting.  Genitourinary: Negative for frequency.  Musculoskeletal:       Negative muscle weakness  Endo/Heme/Allergies: Negative for polydipsia.    PHYSICAL EXAM: Blood pressure 120/78, pulse (!) 59, temperature 98.2 F (36.8 C), temperature source Oral, height 5\' 4"  (1.626 m), weight 258 lb (117 kg), SpO2 98 %. Body mass index is 44.29 kg/m. Physical Exam  Constitutional: She is oriented to person, place, and time. She appears well-developed and well-nourished.  Cardiovascular: Normal rate.  Pulmonary/Chest: Effort normal.  Musculoskeletal: Normal range of motion.  Neurological: She is oriented to person, place, and time.  Skin: Skin is warm and dry.  Psychiatric: She has a normal mood and affect. Her behavior is normal.  Vitals reviewed.   RECENT LABS AND TESTS: BMET    Component Value Date/Time   NA 146 (H) 03/26/2018 1315   K 4.0 03/26/2018 1315   CL 107 (H) 03/26/2018 1315   CO2 27 03/26/2018 1315   GLUCOSE 96 03/26/2018 1315   GLUCOSE 98 01/14/2017 1030   BUN 10 03/26/2018 1315   CREATININE 0.82 03/26/2018 1315   CREATININE 0.85 12/09/2013 1453   CALCIUM 9.2 03/26/2018 1315   GFRNONAA 75 03/26/2018 1315   GFRAA 87 03/26/2018 1315   Lab Results  Component Value Date   HGBA1C 5.5 03/26/2018   HGBA1C 5.5 08/11/2017   HGBA1C 5.4 01/18/2009   HGBA1C 5.5 04/11/2008   Lab Results  Component Value Date   INSULIN 12.5 03/26/2018   INSULIN 21.8 08/11/2017   CBC    Component Value Date/Time   WBC 9.2 08/11/2017 1155   WBC 10.3 01/10/2017 1404   RBC 4.34 08/11/2017 1155   RBC 4.05 01/10/2017 1404   HGB 13.3 08/11/2017 1155   HCT 40.8 08/11/2017 1155   PLT 358 08/11/2017 1155   MCV 94 08/11/2017 1155   MCH 30.6 08/11/2017 1155   MCH 30.9 12/09/2013 1453   MCHC 32.6 08/11/2017 1155   MCHC 32.2 01/10/2017 1404   RDW 14.8 08/11/2017 1155    LYMPHSABS 2.2 08/11/2017 1155   MONOABS 0.8 12/21/2010 1107   EOSABS 0.4 08/11/2017 1155   BASOSABS 0.0 08/11/2017 1155   Iron/TIBC/Ferritin/ %Sat No results found for: IRON, TIBC, FERRITIN, IRONPCTSAT Lipid Panel     Component Value Date/Time   CHOL 150 08/11/2017 1155   TRIG 68 08/11/2017 1155   HDL 52 08/11/2017 1155   CHOLHDL 2.9 08/11/2017 1155   CHOLHDL 3 01/10/2017 1404  VLDL 27.8 01/10/2017 1404   LDLCALC 84 08/11/2017 1155   Hepatic Function Panel     Component Value Date/Time   PROT 6.7 03/26/2018 1315   ALBUMIN 4.0 03/26/2018 1315   AST 15 03/26/2018 1315   ALT 15 03/26/2018 1315   ALKPHOS 82 03/26/2018 1315   BILITOT 0.6 03/26/2018 1315   BILIDIR 0.1 12/09/2013 1453   IBILI 0.4 12/09/2013 1453      Component Value Date/Time   TSH 2.050 08/11/2017 1155   TSH 2.18 01/10/2017 1404   TSH 1.45 05/06/2016 1406  Results for MAHLI, GLAHN (MRN [id number]) as of 03/30/2018 10:09  Ref. Range 08/11/2017 11:55  Vitamin D, 25-Hydroxy Latest Ref Range: 30.0 - 100.0 ng/mL 6.3 (L)    ASSESSMENT AND PLAN: Vitamin D deficiency - Plan: Vitamin D, Ergocalciferol, (DRISDOL) 50000 units CAPS capsule, VITAMIN D 25 Hydroxy (Vit-D Deficiency, Fractures)  Insulin resistance - Plan: Comprehensive metabolic panel, Hemoglobin A1c, Insulin, random  At risk for diabetes mellitus  Class 3 severe obesity with serious comorbidity and body mass index (BMI) of 40.0 to 44.9 in adult, unspecified obesity type (HCC)  PLAN:  Vitamin D Deficiency Terri [name] was informed that low vitamin D levels contributes to fatigue and are associated with obesity, breast, and colon cancer. Terri [name] agrees to continue taking prescription Vit D @50 ,000 IU every week #4 and we will refill for 1 month. She will follow up for routine testing of vitamin D, at least 2-3 times per year. She was informed of the risk of over-replacement of vitamin D and agrees to not increase her dose unless she discusses this with [name]  first. We will check labs and Terri [name] agrees to follow up with our clinic in 2 to 3 weeks.  Insulin Resistance Terri [name] will continue to work on weight loss, exercise, and decreasing simple carbohydrates in her diet to help decrease the risk of diabetes. We dicussed metformin including benefits and risks. She was informed that eating too many simple carbohydrates or too many calories at one sitting increases the likelihood of GI side effects. Terri [name] declined metformin for now and prescription was not written today. We will check labs and Terri [name] agrees to follow up with our clinic in 2 to 3 weeks as directed to monitor her progress.  Diabetes risk counselling Terri [name] was given extended (15 minutes) diabetes prevention counseling today. She is 67 y.o. female and has risk factors for diabetes including obesity and insulin resistance. We discussed intensive lifestyle modifications today with an emphasis on weight loss as well as increasing exercise and decreasing simple carbohydrates in her diet.  Obesity Terri [name] is currently in the action stage of change. As such, her goal is to get back to weightloss efforts  She has agreed to follow the Category 2 plan Terri [name] has been instructed to work up to a goal of 150 minutes of combined cardio and strengthening exercise per week for weight loss and overall health benefits. We discussed the following Behavioral Modification Strategies today: increasing lean protein intake, work on meal planning and easy cooking plans, and planning for success   Terri [name] has agreed to follow up with our clinic in 2 to 3 weeks. She was informed of the importance of frequent follow up visits to maximize her success with intensive lifestyle modifications for her multiple health conditions.   OBESITY BEHAVIORAL INTERVENTION VISIT  Today's visit was # 9   Starting weight: 261 lbs Starting date: 08/11/17 Today's weight : 258 lbs Today's date: 03/26/2018 Total lbs lost  to date:  3    ASK: We discussed the diagnosis of obesity with Terri [name] today and Terri [name] agreed to give [name] permission to discuss obesity behavioral modification therapy today.  ASSESS: Terri [name] has the diagnosis of obesity and her BMI today is 44.26 Terri [name] is in the action stage of change   ADVISE: Terri [name] was educated on the multiple health risks of obesity as well as the benefit of weight loss to improve her health. She was advised of the need for long term treatment and the importance of lifestyle modifications.  AGREE: Multiple dietary modification options and treatment options were discussed and  Terri [name] agreed to the above obesity treatment plan.  Trude Mcburney, am acting as transcriptionist for Alois Cliche, PA-C I, Alois Cliche, PA-C have reviewed above note and agree with its content

## 2018-04-13 ENCOUNTER — Ambulatory Visit (INDEPENDENT_AMBULATORY_CARE_PROVIDER_SITE_OTHER): Payer: Self-pay | Admitting: Physician Assistant

## 2018-04-21 ENCOUNTER — Ambulatory Visit (INDEPENDENT_AMBULATORY_CARE_PROVIDER_SITE_OTHER): Payer: BC Managed Care – PPO | Admitting: Physician Assistant

## 2018-04-21 ENCOUNTER — Encounter (INDEPENDENT_AMBULATORY_CARE_PROVIDER_SITE_OTHER): Payer: Self-pay | Admitting: Physician Assistant

## 2018-04-21 VITALS — BP 126/61 | HR 69 | Temp 97.8°F | Ht 64.0 in | Wt 256.0 lb

## 2018-04-21 DIAGNOSIS — E8881 Metabolic syndrome: Secondary | ICD-10-CM

## 2018-04-21 DIAGNOSIS — Z6841 Body Mass Index (BMI) 40.0 and over, adult: Secondary | ICD-10-CM

## 2018-04-21 DIAGNOSIS — E88819 Insulin resistance, unspecified: Secondary | ICD-10-CM

## 2018-04-22 LAB — HM PAP SMEAR: HM Pap smear: NEGATIVE

## 2018-04-22 LAB — HM MAMMOGRAPHY

## 2018-04-23 NOTE — Progress Notes (Signed)
Office: [phone]  /  Fax: [phone]   HPI:   Chief Complaint: OBESITY Terri [name] is here to discuss her progress with her obesity treatment plan. She is on the Category 2 plan and is following her eating plan approximately 30 % of the time. She states she is walking 35 minutes 3 times per week. Wanna did well with weight loss. She reports eating out more often, especially for dinner. She is working more hours as well.  Her weight is 256 lb (116.1 kg) today and has had a weight loss of 2 pounds over a period of 3 weeks since her last visit. She has lost 5 lbs since starting treatment with [name].  Insulin Resistance Terri [name] has a diagnosis of insulin resistance based on her elevated fasting insulin level >5. Although [name]'s blood glucose readings are still under good control, insulin resistance puts her at greater risk of metabolic syndrome and diabetes. She is not taking medications currently and continues to work on diet and exercise to decrease risk of diabetes. She denies polyphagia.  ALLERGIES: Allergies  Allergen Reactions  . Sulfonamide Derivatives     MEDICATIONS: Current Outpatient Medications on File Prior to Visit  Medication Sig Dispense Refill  . amLODipine-olmesartan (AZOR) 10-40 MG tablet Take 1 tablet by mouth daily. 90 tablet 1  . levocetirizine (XYZAL) 5 MG tablet Take 5 mg by mouth every evening.    . montelukast (SINGULAIR) 10 MG tablet Take 1 tablet (10 mg total) by mouth at bedtime as needed. 30 tablet 5  . omeprazole (PRILOSEC) 40 MG capsule TAKE ONE CAPSULE BY MOUTH EVERY DAY 90 capsule 0  . oxybutynin (DITROPAN-XL) 10 MG 24 hr tablet Take 10 mg by mouth at bedtime.    Marland Kitchen tiZANidine (ZANAFLEX) 4 MG tablet Take 0.5-1 tablets (2-4 mg total) by mouth every 6 (six) hours as needed for muscle spasms. 30 tablet 1  . Vitamin D, Ergocalciferol, (DRISDOL) 50000 units CAPS capsule Take 1 capsule (50,000 Units total) by mouth every 7 (seven) days. 12 capsule 0   No  current facility-administered medications on file prior to visit.     PAST MEDICAL HISTORY: Past Medical History:  Diagnosis Date  . Allergic rhinitis   . Apnea, sleep 06/28/2012   Previously used CPAP  . Asthma   . Atypical chest pain 01/14/2017  . Back pain   . Decreased visual acuity 08/05/2016  . Depression 06/19/2015  . Environmental and seasonal allergies   . GERD (gastroesophageal reflux disease)   . Heartburn   . Hip pain, right 06/28/2012  . HTN (hypertension)   . Hypokalemia 01/14/2017  . Leg pain   . Morbid obesity (HCC) 04/11/2008   Qualifier: Diagnosis of  By: [name]   . Numbness in feet 05/06/2016  . Obesity   . Osteoarthritis 01/16/2010   Qualifier: Diagnosis of  By: [name]   . Preventative health care 06/25/2015  . Shortness of breath   . Shortness of breath on exertion   . Sleep apnea   . Swelling of extremity   . Tinnitus   . Vertigo     PAST SURGICAL HISTORY: Past Surgical History:  Procedure Laterality Date  . BIOPSY BREAST  1997   left   . childbirth      SOCIAL HISTORY: Social History   Tobacco Use  . Smoking status: Never Smoker  . Smokeless tobacco: Never Used  Substance Use Topics  . Alcohol use: Yes    Comment: social  .  Drug use: No    FAMILY HISTORY: Family History  Problem Relation Age of Onset  . Stroke Mother   . Liver disease Mother        hepatitis b  . Hypertension Mother   . Cancer Father        esophagus, prostate  . Hypertension Father   . Heart disease Father   . Liver disease Brother        hep c  . Mental illness Brother        PTSD from [place]  . Stroke Maternal Grandmother   . Cancer Paternal Grandfather        prostate    ROS: Review of Systems  Constitutional: Positive for weight loss.  Endo/Heme/Allergies:       Negative for polyphagia.    PHYSICAL EXAM: Blood pressure 126/61, pulse 69, temperature 97.8 F (36.6 C), temperature source Oral, height 5\' 4"  (1.626 m), weight 256  lb (116.1 kg), SpO2 99 %. Body mass index is 43.94 kg/m. Physical Exam  Constitutional: She is oriented to person, place, and time. She appears well-developed and well-nourished.  Cardiovascular: Normal rate.  Pulmonary/Chest: Effort normal.  Musculoskeletal: Normal range of motion.  Neurological: She is oriented to person, place, and time.  Skin: Skin is warm and dry.  Psychiatric: She has a normal mood and affect. Her behavior is normal.  Vitals reviewed.   RECENT LABS AND TESTS: BMET    Component Value Date/Time   NA 146 (H) 03/26/2018 1315   K 4.0 03/26/2018 1315   CL 107 (H) 03/26/2018 1315   CO2 27 03/26/2018 1315   GLUCOSE 96 03/26/2018 1315   GLUCOSE 98 01/14/2017 1030   BUN 10 03/26/2018 1315   CREATININE 0.82 03/26/2018 1315   CREATININE 0.85 12/09/2013 1453   CALCIUM 9.2 03/26/2018 1315   GFRNONAA 75 03/26/2018 1315   GFRAA 87 03/26/2018 1315   Lab Results  Component Value Date   HGBA1C 5.5 03/26/2018   HGBA1C 5.5 08/11/2017   HGBA1C 5.4 01/18/2009   HGBA1C 5.5 04/11/2008   Lab Results  Component Value Date   INSULIN 12.5 03/26/2018   INSULIN 21.8 08/11/2017   CBC    Component Value Date/Time   WBC 9.2 08/11/2017 1155   WBC 10.3 01/10/2017 1404   RBC 4.34 08/11/2017 1155   RBC 4.05 01/10/2017 1404   HGB 13.3 08/11/2017 1155   HCT 40.8 08/11/2017 1155   PLT 358 08/11/2017 1155   MCV 94 08/11/2017 1155   MCH 30.6 08/11/2017 1155   MCH 30.9 12/09/2013 1453   MCHC 32.6 08/11/2017 1155   MCHC 32.2 01/10/2017 1404   RDW 14.8 08/11/2017 1155   LYMPHSABS 2.2 08/11/2017 1155   MONOABS 0.8 12/21/2010 1107   EOSABS 0.4 08/11/2017 1155   BASOSABS 0.0 08/11/2017 1155   Iron/TIBC/Ferritin/ %Sat No results found for: IRON, TIBC, FERRITIN, IRONPCTSAT Lipid Panel     Component Value Date/Time   CHOL 150 08/11/2017 1155   TRIG 68 08/11/2017 1155   HDL 52 08/11/2017 1155   CHOLHDL 2.9 08/11/2017 1155   CHOLHDL 3 01/10/2017 1404   VLDL 27.8 01/10/2017  1404   LDLCALC 84 08/11/2017 1155   Hepatic Function Panel     Component Value Date/Time   PROT 6.7 03/26/2018 1315   ALBUMIN 4.0 03/26/2018 1315   AST 15 03/26/2018 1315   ALT 15 03/26/2018 1315   ALKPHOS 82 03/26/2018 1315   BILITOT 0.6 03/26/2018 1315   BILIDIR 0.1 12/09/2013 1453  IBILI 0.4 12/09/2013 1453      Component Value Date/Time   TSH 2.050 08/11/2017 1155   TSH 2.18 01/10/2017 1404   TSH 1.45 05/06/2016 1406   Results for COLLEENE, SWARTHOUT (MRN [id number]) as of 04/23/2018 15:11  Ref. Range 03/26/2018 13:15  Vitamin D, 25-Hydroxy Latest Ref Range: 30.0 - 100.0 ng/mL 24.0 (L)   ASSESSMENT AND PLAN: Insulin resistance  Class 3 severe obesity with serious comorbidity and body mass index (BMI) of 40.0 to 44.9 in adult, unspecified obesity type (HCC)  PLAN:  Insulin Resistance Jane will continue to work on weight loss, exercise, and decreasing simple carbohydrates in her diet to help decrease the risk of diabetes.  She was informed that eating too many simple carbohydrates or too many calories at one sitting increases the likelihood of GI side effects. Jeniyah agreed to continue with her diet and weight loss and agreed to follow up with [name] as directed to monitor her progress in 2 weeks.  I spent > than 50% of the 15 minute visit on counseling as documented in the note.  Obesity Tameka is currently in the action stage of change. As such, her goal is to continue with weight loss efforts. She has agreed to follow the Category 2 plan. The "Eating Out" sheet was given. Caitlynn has been instructed to work up to a goal of 150 minutes of combined cardio and strengthening exercise per week for weight loss and overall health benefits. We discussed the following Behavioral Modification Strategies today: increasing lean protein intake and work on meal planning and easy cooking plans and keep a strict food journal.   Zoii has agreed to follow up with our clinic in 2 weeks.  She was informed of the importance of frequent follow up visits to maximize her success with intensive lifestyle modifications for her multiple health conditions.   OBESITY BEHAVIORAL INTERVENTION VISIT  Today's visit was # 10   Starting weight: 261 lbs Starting date: 08/11/17 Today's weight : Weight: 256 lb (116.1 kg)  Today's date: 04/21/2018 Total lbs lost to date: 5  ASK: We discussed the diagnosis of obesity with [name] today and Kacey agreed to give [name] permission to discuss obesity behavioral modification therapy today.  ASSESS: Arantza has the diagnosis of obesity and her BMI today is 43.92. Jerolyn is in the action stage of change.   ADVISE: Falecia was educated on the multiple health risks of obesity as well as the benefit of weight loss to improve her health. She was advised of the need for long term treatment and the importance of lifestyle modifications to improve her current health and to decrease her risk of future health problems.  AGREE: Multiple dietary modification options and treatment options were discussed and Winna agreed to follow the recommendations documented in the above note.  ARRANGE: Jolana was educated on the importance of frequent visits to treat obesity as outlined per CMS and USPSTF guidelines and agreed to schedule her next follow up appointment today.  Launa Flight, am acting as transcriptionist for Alois Cliche, PA-C I, Alois Cliche, PA-C have reviewed above note and agree with its content

## 2018-04-29 ENCOUNTER — Telehealth: Payer: Self-pay | Admitting: *Deleted

## 2018-04-29 NOTE — Telephone Encounter (Signed)
[facility]; forwarded to provider/SLS 11/27

## 2018-05-08 ENCOUNTER — Encounter: Payer: Self-pay | Admitting: Family Medicine

## 2018-05-09 ENCOUNTER — Other Ambulatory Visit: Payer: Self-pay | Admitting: Family Medicine

## 2018-05-13 ENCOUNTER — Ambulatory Visit (INDEPENDENT_AMBULATORY_CARE_PROVIDER_SITE_OTHER): Payer: Self-pay | Admitting: Bariatrics

## 2018-05-16 ENCOUNTER — Other Ambulatory Visit: Payer: Self-pay | Admitting: Family Medicine

## 2018-05-25 ENCOUNTER — Encounter (INDEPENDENT_AMBULATORY_CARE_PROVIDER_SITE_OTHER): Payer: Self-pay | Admitting: Physician Assistant

## 2018-05-25 ENCOUNTER — Ambulatory Visit (INDEPENDENT_AMBULATORY_CARE_PROVIDER_SITE_OTHER): Payer: BC Managed Care – PPO | Admitting: Physician Assistant

## 2018-05-25 VITALS — BP 121/75 | HR 71 | Temp 98.1°F | Ht 64.0 in | Wt 256.0 lb

## 2018-05-25 DIAGNOSIS — Z6841 Body Mass Index (BMI) 40.0 and over, adult: Secondary | ICD-10-CM | POA: Diagnosis not present

## 2018-05-25 DIAGNOSIS — E559 Vitamin D deficiency, unspecified: Secondary | ICD-10-CM | POA: Diagnosis not present

## 2018-05-25 NOTE — Progress Notes (Signed)
Office: [phone]  /  Fax: [phone]   HPI:   Chief Complaint: OBESITY Terri [name] is here to discuss her progress with her obesity treatment plan. She is on  the Category 2 plan and is following her eating plan approximately 10 % of the time. She states she is walking 30-45 minutes 3 times per week. Terri [name] did well with weight maintenance. [place] [facility] [place]. She reports that she will be ready to get back on track in the new year. Her weight is 256 lb (116.1 kg) today and has not lost weight since her last visit. She has lost 5 lbs since starting treatment with us.  Vitamin D Deficiency Terri [name] has a diagnosis of vitamin D deficiency. She is currently taking prescription Vit D and denies nausea, vomiting or muscle weakness.  ASSESSMENT AND PLAN:  Vitamin D deficiency  Class 3 severe obesity with serious comorbidity and body mass index (BMI) of 40.0 to 44.9 in adult, unspecified obesity type (HCC)  PLAN:  Vitamin D Deficiency Terri [name] was informed that low vitamin D levels contributes to fatigue and are associated with obesity, breast, and colon cancer. Terri [name] agrees to continue taking prescription Vit D 50,000 IU every week and will follow up for routine testing of vitamin D, at least 2-3 times per year. She was informed of the risk of over-replacement of vitamin D and agrees to not increase her dose unless she discusses this with us first. Terri [name] agrees to follow up with our clinic in 4 weeks.  I spent > than 50% of the 15 minute visit on counseling as documented in the note.  Obesity Terri [name] is currently in the action stage of change. As such, her goal is to continue with weight loss efforts She has agreed to follow the Category 2 plan  Terri [name] has been instructed to work up to a goal of 150 minutes of combined cardio and strengthening exercise per week for weight loss and overall health benefits. We discussed the following Behavioral Modification  Strategies today: work on travel eating strategies meal planning and easy cooking plans  Terri [name] has agreed to follow up with our clinic in 4 weeks. She was informed of the importance of frequent follow up visits to maximize her success with intensive lifestyle modifications for her multiple health conditions.  ALLERGIES: Allergies  Allergen Reactions  . Sulfonamide Derivatives     MEDICATIONS: Current Outpatient Medications on File Prior to Visit  Medication Sig Dispense Refill  . amLODipine-olmesartan (AZOR) 10-40 MG tablet TAKE 1 TABLET BY MOUTH EVERY DAY 90 tablet 1  . levocetirizine (XYZAL) 5 MG tablet Take 5 mg by mouth every evening.    . montelukast (SINGULAIR) 10 MG tablet Take 1 tablet (10 mg total) by mouth at bedtime as needed. 30 tablet 5  . omeprazole (PRILOSEC) 40 MG capsule TAKE ONE CAPSULE BY MOUTH EVERY DAY 90 capsule 0  . oxybutynin (DITROPAN-XL) 10 MG 24 hr tablet Take 10 mg by mouth at bedtime.    Marland Kitchen. tiZANidine (ZANAFLEX) 4 MG tablet Take 0.5-1 tablets (2-4 mg total) by mouth every 6 (six) hours as needed for muscle spasms. 30 tablet 1  . Vitamin D, Ergocalciferol, (DRISDOL) 50000 units CAPS capsule Take 1 capsule (50,000 Units total) by mouth every 7 (seven) days. 12 capsule 0   No current facility-administered medications on file prior to visit.     PAST MEDICAL HISTORY: Past Medical History:  Diagnosis Date  . Allergic rhinitis   .  Apnea, sleep 06/28/2012   Previously used CPAP  . Asthma   . Atypical chest pain 01/14/2017  . Back pain   . Decreased visual acuity 08/05/2016  . Depression 06/19/2015  . Environmental and seasonal allergies   . GERD (gastroesophageal reflux disease)   . Heartburn   . Hip pain, right 06/28/2012  . HTN (hypertension)   . Hypokalemia 01/14/2017  . Leg pain   . Morbid obesity (HCC) 04/11/2008   Qualifier: Diagnosis of  By: Nena JordanYoo DO, D. Robert   . Numbness in feet 05/06/2016  . Obesity   . Osteoarthritis 01/16/2010   Qualifier:  Diagnosis of  By: Nena JordanYoo DO, D. Robert   . Preventative health care 06/25/2015  . Shortness of breath   . Shortness of breath on exertion   . Sleep apnea   . Swelling of extremity   . Tinnitus   . Vertigo     PAST SURGICAL HISTORY: Past Surgical History:  Procedure Laterality Date  . BIOPSY BREAST  1997   left   . childbirth      SOCIAL HISTORY: Social History   Tobacco Use  . Smoking status: Never Smoker  . Smokeless tobacco: Never Used  Substance Use Topics  . Alcohol use: Yes    Comment: social  . Drug use: No    FAMILY HISTORY: Family History  Problem Relation Age of Onset  . Stroke Mother   . Liver disease Mother        hepatitis b  . Hypertension Mother   . Cancer Father        esophagus, prostate  . Hypertension Father   . Heart disease Father   . Liver disease Brother        hep c  . Mental illness Brother        PTSD from [place]  . Stroke Maternal Grandmother   . Cancer Paternal Grandfather        prostate    ROS: Review of Systems  Constitutional: Negative for weight loss.  Gastrointestinal: Negative for nausea and vomiting.  Musculoskeletal:       Negative for muscle weakness    PHYSICAL EXAM: Blood pressure 121/75, pulse 71, temperature 98.1 F (36.7 C), temperature source Oral, height 5\' 4"  (1.626 m), weight 256 lb (116.1 kg), SpO2 95 %. Body mass index is 43.94 kg/m. Physical Exam Vitals signs reviewed.  Constitutional:      Appearance: Normal appearance. She is obese.  Cardiovascular:     Rate and Rhythm: Normal rate.     Pulses: Normal pulses.  Pulmonary:     Effort: Pulmonary effort is normal.  Musculoskeletal: Normal range of motion.  Skin:    General: Skin is warm and dry.  Neurological:     Mental Status: She is alert and oriented to person, place, and time.  Psychiatric:        Mood and Affect: Mood normal.        Behavior: Behavior normal.     RECENT LABS AND TESTS: BMET    Component Value Date/Time   NA 146 (H)  03/26/2018 1315   K 4.0 03/26/2018 1315   CL 107 (H) 03/26/2018 1315   CO2 27 03/26/2018 1315   GLUCOSE 96 03/26/2018 1315   GLUCOSE 98 01/14/2017 1030   BUN 10 03/26/2018 1315   CREATININE 0.82 03/26/2018 1315   CREATININE 0.85 12/09/2013 1453   CALCIUM 9.2 03/26/2018 1315   GFRNONAA 75 03/26/2018 1315   GFRAA 87 03/26/2018 1315  Lab Results  Component Value Date   HGBA1C 5.5 03/26/2018   HGBA1C 5.5 08/11/2017   HGBA1C 5.4 01/18/2009   HGBA1C 5.5 04/11/2008   Lab Results  Component Value Date   INSULIN 12.5 03/26/2018   INSULIN 21.8 08/11/2017   CBC    Component Value Date/Time   WBC 9.2 08/11/2017 1155   WBC 10.3 01/10/2017 1404   RBC 4.34 08/11/2017 1155   RBC 4.05 01/10/2017 1404   HGB 13.3 08/11/2017 1155   HCT 40.8 08/11/2017 1155   PLT 358 08/11/2017 1155   MCV 94 08/11/2017 1155   MCH 30.6 08/11/2017 1155   MCH 30.9 12/09/2013 1453   MCHC 32.6 08/11/2017 1155   MCHC 32.2 01/10/2017 1404   RDW 14.8 08/11/2017 1155   LYMPHSABS 2.2 08/11/2017 1155   MONOABS 0.8 12/21/2010 1107   EOSABS 0.4 08/11/2017 1155   BASOSABS 0.0 08/11/2017 1155   Iron/TIBC/Ferritin/ %Sat No results found for: IRON, TIBC, FERRITIN, IRONPCTSAT Lipid Panel     Component Value Date/Time   CHOL 150 08/11/2017 1155   TRIG 68 08/11/2017 1155   HDL 52 08/11/2017 1155   CHOLHDL 2.9 08/11/2017 1155   CHOLHDL 3 01/10/2017 1404   VLDL 27.8 01/10/2017 1404   LDLCALC 84 08/11/2017 1155   Hepatic Function Panel     Component Value Date/Time   PROT 6.7 03/26/2018 1315   ALBUMIN 4.0 03/26/2018 1315   AST 15 03/26/2018 1315   ALT 15 03/26/2018 1315   ALKPHOS 82 03/26/2018 1315   BILITOT 0.6 03/26/2018 1315   BILIDIR 0.1 12/09/2013 1453   IBILI 0.4 12/09/2013 1453      Component Value Date/Time   TSH 2.050 08/11/2017 1155   TSH 2.18 01/10/2017 1404   TSH 1.45 05/06/2016 1406     Ref. Range 03/26/2018 13:15  Vitamin D, 25-Hydroxy Latest Ref Range: 30.0 - 100.0 ng/mL 24.0 (L)      OBESITY BEHAVIORAL INTERVENTION VISIT  Today's visit was # 14   Starting weight: 261 lbs Starting date: 08/11/17 Today's weight : 256 lbs Today's date: 05/25/2018 Total lbs lost to date: 5  ASK: We discussed the diagnosis of obesity with [name] today and Damaris agreed to give [name] permission to discuss obesity behavioral modification therapy today.  ASSESS: Zulma has the diagnosis of obesity and her BMI today is 43.92 Diasha is in the action stage of change   ADVISE: Haely was educated on the multiple health risks of obesity as well as the benefit of weight loss to improve her health. She was advised of the need for long term treatment and the importance of lifestyle modifications to improve her current health and to decrease her risk of future health problems.  AGREE: Multiple dietary modification options and treatment options were discussed and  Aricka agreed to follow the recommendations documented in the above note.  ARRANGE: Jonee was educated on the importance of frequent visits to treat obesity as outlined per CMS and USPSTF guidelines and agreed to schedule her next follow up appointment today.  I, Tammy Wysor, am acting as Energy manager for [employer], PA-C I, Alois Cliche, PA-C have reviewed above note and agree with its content

## 2018-05-28 ENCOUNTER — Telehealth: Payer: Self-pay | Admitting: *Deleted

## 2018-05-28 NOTE — Telephone Encounter (Signed)
[facility]; forwarded to provider/SLS 12/26

## 2018-06-02 ENCOUNTER — Encounter: Payer: Self-pay | Admitting: Family Medicine

## 2018-06-22 ENCOUNTER — Encounter (INDEPENDENT_AMBULATORY_CARE_PROVIDER_SITE_OTHER): Payer: Self-pay | Admitting: Physician Assistant

## 2018-06-22 ENCOUNTER — Ambulatory Visit: Payer: BC Managed Care – PPO | Admitting: Family Medicine

## 2018-06-22 ENCOUNTER — Ambulatory Visit (INDEPENDENT_AMBULATORY_CARE_PROVIDER_SITE_OTHER): Payer: BC Managed Care – PPO | Admitting: Physician Assistant

## 2018-06-22 VITALS — BP 137/83 | HR 70 | Temp 98.6°F | Ht 64.0 in | Wt 255.0 lb

## 2018-06-22 DIAGNOSIS — Z9189 Other specified personal risk factors, not elsewhere classified: Secondary | ICD-10-CM | POA: Diagnosis not present

## 2018-06-22 DIAGNOSIS — I10 Essential (primary) hypertension: Secondary | ICD-10-CM

## 2018-06-22 DIAGNOSIS — E559 Vitamin D deficiency, unspecified: Secondary | ICD-10-CM | POA: Diagnosis not present

## 2018-06-22 DIAGNOSIS — Z6841 Body Mass Index (BMI) 40.0 and over, adult: Secondary | ICD-10-CM

## 2018-06-22 MED ORDER — VITAMIN D (ERGOCALCIFEROL) 1.25 MG (50000 UNIT) PO CAPS: 50000.0000 [IU] | ORAL_CAPSULE | ORAL | 0 refills | Status: DC

## 2018-06-22 NOTE — Progress Notes (Signed)
Office: [phone]  /  Fax: [phone]   HPI:   Chief Complaint: OBESITY Terri [name] is here to discuss her progress with her obesity treatment plan. She is on the Category 2 plan and is following her eating plan approximately 20 % of the time. She states she is walking 5000 steps 3 times per week. Edid did well weight loss. She reports that she has been eating out more often and is not yet cooking her meals as she had hoped to in the new year. She continues to drink sweet tea and soda. Her weight is 255 lb (115.7 kg) today and has had a weight loss of 1 pounds over a period of 5 [name] since her last visit. She has lost 6 lbs since starting treatment with Terri [name].  Hypertension Terri [name] is a 68 y.o. female with hypertension.  Terri [name] denies chest pain.  She is working weight loss to help control her blood pressure with the goal of decreasing her risk of heart attack and stroke. Terri [name] blood pressure is currently controlled. She is taking amlodipine-olmesartan.  Vitamin D deficiency Terri [name] has a diagnosis of vitamin D deficiency. She is currently taking prescription Vit D and denies nausea, vomiting or muscle weakness.  At risk for cardiovascular disease Terri [name] is at a higher than average risk for cardiovascular disease due to obesity. She currently denies any chest pain.  ASSESSMENT AND PLAN:  Essential hypertension  Vitamin D deficiency - Plan: Vitamin D, Ergocalciferol, (DRISDOL) 1.25 MG (50000 UT) CAPS capsule  At risk for heart disease  Class 3 severe obesity with serious comorbidity and body mass index (BMI) of 40.0 to 44.9 in adult, unspecified obesity type (HCC)  PLAN:  Hypertension We discussed sodium restriction, working on healthy weight loss, and a regular exercise program as the means to achieve improved blood pressure control. Shakiea agreed with this plan and agreed to follow up as directed. We will continue to monitor her blood pressure as well as her  progress with the above lifestyle modifications. She will continue her medications as prescribed and will watch for signs of hypotension as she continues her lifestyle modifications. Keyatta agrees to follow up with Terri [name].  Vitamin D Deficiency Terri [name] was informed that low vitamin D levels contributes to fatigue and are associated with obesity, breast, and colon cancer. She agrees to continue taking prescription Vit D @50 ,000 IU every week #4 with no refills and will follow up for routine testing of vitamin D, at least 2-3 times per year. She was informed of the risk of over-replacement of vitamin D and agrees to not increase her dose unless she discusses this with Terri [name] first. Yazlyn agrees to follow up with Terri [name].  Cardiovascular risk counseling Terri [name] was given extended (15 minutes) coronary artery disease prevention counseling today. She is 68 y.o. female and has risk factors for heart disease including obesity. We discussed intensive lifestyle modifications today with an emphasis on specific weight loss instructions and strategies. Pt was also informed of the importance of increasing exercise and decreasing saturated fats to help prevent heart disease.  Obesity Terri [name] is currently in the action stage of change. As such, her goal is to continue with weight loss efforts She has agreed to follow the Category 2 plan Terri [name] has been instructed to work up to a goal of 150 minutes of combined cardio and strengthening exercise per week for weight loss and overall health benefits. We discussed the following  Behavioral Modification Strategies today: increasing lean protein intake and work on meal planning and easy cooking plans  Terri [name] has agreed to follow up with Terri [name]. She was informed of the importance of frequent follow up visits to maximize her success with intensive lifestyle modifications for her multiple health conditions.  ALLERGIES: Allergies    Allergen Reactions  . Sulfonamide Derivatives     MEDICATIONS: Current Outpatient Medications on File Prior to Visit  Medication Sig Dispense Refill  . amLODipine-olmesartan (AZOR) 10-40 MG tablet TAKE 1 TABLET BY MOUTH EVERY DAY 90 tablet 1  . levocetirizine (XYZAL) 5 MG tablet Take 5 mg by mouth every evening.    . montelukast (SINGULAIR) 10 MG tablet Take 1 tablet (10 mg total) by mouth at bedtime as needed. 30 tablet 5  . omeprazole (PRILOSEC) 40 MG capsule TAKE ONE CAPSULE BY MOUTH EVERY DAY 90 capsule 0  . oxybutynin (DITROPAN-XL) 10 MG 24 hr tablet Take 10 mg by mouth at bedtime.    Marland Kitchen tiZANidine (ZANAFLEX) 4 MG tablet Take 0.5-1 tablets (2-4 mg total) by mouth every 6 (six) hours as needed for muscle spasms. 30 tablet 1   No current facility-administered medications on file prior to visit.     PAST MEDICAL HISTORY: Past Medical History:  Diagnosis Date  . Allergic rhinitis   . Apnea, sleep 06/28/2012   Previously used CPAP  . Asthma   . Atypical chest pain 01/14/2017  . Back pain   . Decreased visual acuity 08/05/2016  . Depression 06/19/2015  . Environmental and seasonal allergies   . GERD (gastroesophageal reflux disease)   . Heartburn   . Hip pain, right 06/28/2012  . HTN (hypertension)   . Hypokalemia 01/14/2017  . Leg pain   . Morbid obesity (HCC) 04/11/2008   Qualifier: Diagnosis of  By: [name]   . Numbness in feet 05/06/2016  . Obesity   . Osteoarthritis 01/16/2010   Qualifier: Diagnosis of  By: [name]   . Preventative health care 06/25/2015  . Shortness of breath   . Shortness of breath on exertion   . Sleep apnea   . Swelling of extremity   . Tinnitus   . Vertigo     PAST SURGICAL HISTORY: Past Surgical History:  Procedure Laterality Date  . BIOPSY BREAST  1997   left   . childbirth      SOCIAL HISTORY: Social History   Tobacco Use  . Smoking status: Never Smoker  . Smokeless tobacco: Never Used  Substance Use Topics  .  Alcohol use: Yes    Comment: social  . Drug use: No    FAMILY HISTORY: Family History  Problem Relation Age of Onset  . Stroke Mother   . Liver disease Mother        hepatitis b  . Hypertension Mother   . Cancer Father        esophagus, prostate  . Hypertension Father   . Heart disease Father   . Liver disease Brother        hep c  . Mental illness Brother        PTSD from [place]  . Stroke Maternal Grandmother   . Cancer Paternal Grandfather        prostate    ROS: Review of Systems  Constitutional: Positive for weight loss.  Cardiovascular: Negative for chest pain.  Gastrointestinal: Negative for nausea and vomiting.  Musculoskeletal:  Negative for muscle weakness    PHYSICAL EXAM: Blood pressure 137/83, pulse 70, temperature 98.6 F (37 C), temperature source Oral, height 5\' 4"  (1.626 m), weight 255 lb (115.7 kg), SpO2 99 %. Body mass index is 43.77 kg/m. Physical Exam Vitals signs reviewed.  Constitutional:      Appearance: Normal appearance. She is obese.  Cardiovascular:     Rate and Rhythm: Normal rate.     Pulses: Normal pulses.  Pulmonary:     Effort: Pulmonary effort is normal.  Musculoskeletal: Normal range of motion.  Skin:    General: Skin is warm and dry.  Neurological:     Mental Status: She is alert and oriented to person, place, and time.  Psychiatric:        Mood and Affect: Mood normal.        Behavior: Behavior normal.     RECENT LABS AND TESTS: BMET    Component Value Date/Time   NA 146 (H) 03/26/2018 1315   K 4.0 03/26/2018 1315   CL 107 (H) 03/26/2018 1315   CO2 27 03/26/2018 1315   GLUCOSE 96 03/26/2018 1315   GLUCOSE 98 01/14/2017 1030   BUN 10 03/26/2018 1315   CREATININE 0.82 03/26/2018 1315   CREATININE 0.85 12/09/2013 1453   CALCIUM 9.2 03/26/2018 1315   GFRNONAA 75 03/26/2018 1315   GFRAA 87 03/26/2018 1315   Lab Results  Component Value Date   HGBA1C 5.5 03/26/2018   HGBA1C 5.5 08/11/2017   HGBA1C 5.4  01/18/2009   HGBA1C 5.5 04/11/2008   Lab Results  Component Value Date   INSULIN 12.5 03/26/2018   INSULIN 21.8 08/11/2017   CBC    Component Value Date/Time   WBC 9.2 08/11/2017 1155   WBC 10.3 01/10/2017 1404   RBC 4.34 08/11/2017 1155   RBC 4.05 01/10/2017 1404   HGB 13.3 08/11/2017 1155   HCT 40.8 08/11/2017 1155   PLT 358 08/11/2017 1155   MCV 94 08/11/2017 1155   MCH 30.6 08/11/2017 1155   MCH 30.9 12/09/2013 1453   MCHC 32.6 08/11/2017 1155   MCHC 32.2 01/10/2017 1404   RDW 14.8 08/11/2017 1155   LYMPHSABS 2.2 08/11/2017 1155   MONOABS 0.8 12/21/2010 1107   EOSABS 0.4 08/11/2017 1155   BASOSABS 0.0 08/11/2017 1155   Iron/TIBC/Ferritin/ %Sat No results found for: IRON, TIBC, FERRITIN, IRONPCTSAT Lipid Panel     Component Value Date/Time   CHOL 150 08/11/2017 1155   TRIG 68 08/11/2017 1155   HDL 52 08/11/2017 1155   CHOLHDL 2.9 08/11/2017 1155   CHOLHDL 3 01/10/2017 1404   VLDL 27.8 01/10/2017 1404   LDLCALC 84 08/11/2017 1155   Hepatic Function Panel     Component Value Date/Time   PROT 6.7 03/26/2018 1315   ALBUMIN 4.0 03/26/2018 1315   AST 15 03/26/2018 1315   ALT 15 03/26/2018 1315   ALKPHOS 82 03/26/2018 1315   BILITOT 0.6 03/26/2018 1315   BILIDIR 0.1 12/09/2013 1453   IBILI 0.4 12/09/2013 1453      Component Value Date/Time   TSH 2.050 08/11/2017 1155   TSH 2.18 01/10/2017 1404   TSH 1.45 05/06/2016 1406     Ref. Range 03/26/2018 13:15  Vitamin D, 25-Hydroxy Latest Ref Range: 30.0 - 100.0 ng/mL 24.0 (L)     OBESITY BEHAVIORAL INTERVENTION VISIT  Today's visit was # 12   Starting weight: 261 lbs Starting date: 08/11/2017 Today's weight :: 255 lbs Today's date: 06/22/2018 Total lbs lost to date:  6   ASK: We discussed the diagnosis of obesity with Terri [name] today and Terri [name] agreed to give Terri [name] to discuss obesity behavioral modification therapy today.  ASSESS: Terri [name] has the diagnosis of obesity and her BMI today  is 43.75 Terri [name] is in the action stage of change   ADVISE: Terri [name] was educated on the multiple health risks of obesity as well as the benefit of weight loss to improve her health. She was advised of the need for long term treatment and the importance of lifestyle modifications to improve her current health and to decrease her risk of future health problems.  AGREE: Multiple dietary modification options and treatment options were discussed and  Terri [name] agreed to follow the recommendations documented in the above note.  ARRANGE: Terri [name] was educated on the importance of frequent visits to treat obesity as outlined per CMS and USPSTF guidelines and agreed to schedule her next follow up appointment today.  I, Tammy Wysor, am acting as Energy managertranscriptionist for [employer] Cherokee Clowers, PA-C I, Alois Clicheracey Destiney Sanabia, PA-C have reviewed above note and agree with its content

## 2018-07-13 ENCOUNTER — Encounter: Payer: Self-pay | Admitting: Family Medicine

## 2018-07-13 ENCOUNTER — Ambulatory Visit: Payer: BC Managed Care – PPO | Admitting: Family Medicine

## 2018-07-13 DIAGNOSIS — E559 Vitamin D deficiency, unspecified: Secondary | ICD-10-CM | POA: Diagnosis not present

## 2018-07-13 DIAGNOSIS — R05 Cough: Secondary | ICD-10-CM

## 2018-07-13 DIAGNOSIS — R059 Cough, unspecified: Secondary | ICD-10-CM

## 2018-07-13 DIAGNOSIS — E782 Mixed hyperlipidemia: Secondary | ICD-10-CM

## 2018-07-13 DIAGNOSIS — R739 Hyperglycemia, unspecified: Secondary | ICD-10-CM

## 2018-07-13 MED ORDER — AMOXICILLIN 500 MG PO CAPS: 500.0000 mg | ORAL_CAPSULE | Freq: Three times a day (TID) | ORAL | 0 refills | Status: DC

## 2018-07-13 NOTE — Assessment & Plan Note (Signed)
Encouraged DASH diet, decrease po intake and increase exercise as tolerated. Needs 7-8 hours of sleep nightly. Avoid trans fats, eat small, frequent meals every 4-5 hours with lean proteins, complex carbs and healthy fats. Minimize simple carbs 

## 2018-07-13 NOTE — Progress Notes (Signed)
Subjective:    [name], female    DOB: 01-18-1951, 68 y.o.   MRN: [id number]  No chief complaint on file.   HPI Patient is in today for follow-up.  [place] after going there to support her knees in a marching band activity.  She did a good deal of walking while there but also acknowledges a good deal with eating.  She is currently following with healthy weight and wellness and is frustrated with her recent weight gain.  Overall she feels well except for some persistent cough and congestion she has been struggling with for about a month.  No fevers or chills.  No other acute complaints noted today. Denies CP/palp/SOB/HAn/fevers/GI or GU c/o. Taking meds as prescribed  Past Medical History:  Diagnosis Date  . Allergic rhinitis   . Apnea, sleep 06/28/2012   Previously used CPAP  . Asthma   . Atypical chest pain 01/14/2017  . Back pain   . Decreased visual acuity 08/05/2016  . Depression 06/19/2015  . Environmental and seasonal allergies   . GERD (gastroesophageal reflux disease)   . Heartburn   . Hip pain, right 06/28/2012  . HTN (hypertension)   . Hypokalemia 01/14/2017  . Leg pain   . Morbid obesity (HCC) 04/11/2008   Qualifier: Diagnosis of  By: Nena JordanYoo DO, D. Robert   . Numbness in feet 05/06/2016  . Obesity   . Osteoarthritis 01/16/2010   Qualifier: Diagnosis of  By: Nena JordanYoo DO, D. Robert   . Preventative health care 06/25/2015  . Shortness of breath   . Shortness of breath on exertion   . Sleep apnea   . Swelling of extremity   . Tinnitus   . Vertigo     Past Surgical History:  Procedure Laterality Date  . BIOPSY BREAST  1997   left   . childbirth      Family History  Problem Relation Age of Onset  . Stroke Mother   . Liver disease Mother        hepatitis b  . Hypertension Mother   . Cancer Father        esophagus, prostate  . Hypertension Father   . Heart disease Father   . Liver disease Brother        hep c  .  Mental illness Brother        PTSD from [place]  . Stroke Maternal Grandmother   . Cancer Paternal Grandfather        prostate    Social History   Socioeconomic History  . Marital status: Divorced    Spouse name: Not on file  . Number of children: 1  . Years of education: Not on file  . Highest education level: Not on file  Occupational History  . Occupation: HS music teacher- [employer]  Social Needs  . Financial resource strain: Not on file  . Food insecurity:    Worry: Not on file    Inability: Not on file  . Transportation needs:    Medical: Not on file    Non-medical: Not on file  Tobacco Use  . Smoking status: Never Smoker  . Smokeless tobacco: Never Used  Substance and Sexual Activity  . Alcohol use: Yes    Comment: social  . Drug use: No  . Sexual activity: Never    Comment: band teacher, 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 no dietary restrictions  Lifestyle  . Physical activity:  Days per week: Not on file    Minutes per session: Not on file  . Stress: Not on file  Relationships  . Social connections:    Talks on phone: Not on file    Gets together: Not on file    Attends religious service: Not on file    Active member of club or organization: Not on file    Attends meetings of clubs or organizations: Not on file    Relationship status: Not on file  . Intimate partner violence:    Fear of current or ex partner: Not on file    Emotionally abused: Not on file    Physically abused: Not on file    Forced sexual activity: Not on file  Other Topics Concern  . Not on file  Social History Narrative  . Not on file    Outpatient Medications Prior to Visit  Medication Sig Dispense Refill  . amLODipine-olmesartan (AZOR) 10-40 MG tablet TAKE 1 TABLET BY MOUTH EVERY DAY 90 tablet 1  . levocetirizine (XYZAL) 5 MG tablet Take 5 mg by mouth every evening.    . montelukast (SINGULAIR) 10 MG tablet Take 1 tablet (10 mg total) by mouth at bedtime as needed. 30 tablet 5    . omeprazole (PRILOSEC) 40 MG capsule TAKE ONE CAPSULE BY MOUTH EVERY DAY 90 capsule 0  . oxybutynin (DITROPAN-XL) 10 MG 24 hr tablet Take 10 mg by mouth at bedtime.    Marland Kitchen tiZANidine (ZANAFLEX) 4 MG tablet Take 0.5-1 tablets (2-4 mg total) by mouth every 6 (six) hours as needed for muscle spasms. 30 tablet 1  . Vitamin D, Ergocalciferol, (DRISDOL) 1.25 MG (50000 UT) CAPS capsule Take 1 capsule (50,000 Units total) by mouth every 7 (seven) days. 12 capsule 0   No facility-administered medications prior to visit.     Allergies  Allergen Reactions  . Sulfonamide Derivatives     Review of Systems  Constitutional: Positive for malaise/fatigue. Negative for fever.  HENT: Positive for congestion. Negative for sore throat.   Eyes: Negative for blurred vision.  Respiratory: Positive for cough. Negative for shortness of breath.   Cardiovascular: Negative for chest pain, palpitations and leg swelling.  Gastrointestinal: Negative for abdominal pain, blood in stool and nausea.  Genitourinary: Negative for dysuria and frequency.  Musculoskeletal: Negative for falls.  Skin: Negative for rash.  Neurological: Negative for dizziness, loss of consciousness and headaches.  Endo/Heme/Allergies: Negative for environmental allergies.  Psychiatric/Behavioral: Negative for depression. The patient is not nervous/anxious.        Objective:    Physical Exam Vitals signs and nursing note reviewed.  Constitutional:      General: She is not in acute distress.    Appearance: She is well-developed.  HENT:     Head: Normocephalic and atraumatic.     Nose: Nose normal.  Eyes:     General:        Right eye: No discharge.        Left eye: No discharge.  Neck:     Musculoskeletal: Normal range of motion and neck supple.  Cardiovascular:     Rate and Rhythm: Normal rate and regular rhythm.     Heart sounds: No murmur.  Pulmonary:     Effort: Pulmonary effort is normal.     Breath sounds: Normal breath  sounds.  Abdominal:     General: Bowel sounds are normal.     Palpations: Abdomen is soft.     Tenderness: There is no  abdominal tenderness.  Skin:    General: Skin is warm and dry.  Neurological:     Mental Status: She is alert and oriented to person, place, and time.     BP 126/64 (BP Location: Left Arm, Patient Position: Sitting, Cuff Size: Normal)   Pulse 83   Temp 98.3 F (36.8 C) (Oral)   Resp 18   Wt 266 lb 3.2 oz (120.7 kg)   SpO2 97%   BMI 45.69 kg/m  Wt Readings from Last 3 Encounters:  07/13/18 266 lb 3.2 oz (120.7 kg)  06/22/18 255 lb (115.7 kg)  05/25/18 256 lb (116.1 kg)     Lab Results  Component Value Date   WBC 9.2 08/11/2017   HGB 13.3 08/11/2017   HCT 40.8 08/11/2017   PLT 358 08/11/2017   GLUCOSE 96 03/26/2018   CHOL 150 08/11/2017   TRIG 68 08/11/2017   HDL 52 08/11/2017   LDLCALC 84 08/11/2017   ALT 15 03/26/2018   AST 15 03/26/2018   NA 146 (H) 03/26/2018   K 4.0 03/26/2018   CL 107 (H) 03/26/2018   CREATININE 0.82 03/26/2018   BUN 10 03/26/2018   CO2 27 03/26/2018   TSH 2.050 08/11/2017   HGBA1C 5.5 03/26/2018    Lab Results  Component Value Date   TSH 2.050 08/11/2017   Lab Results  Component Value Date   WBC 9.2 08/11/2017   HGB 13.3 08/11/2017   HCT 40.8 08/11/2017   MCV 94 08/11/2017   PLT 358 08/11/2017   Lab Results  Component Value Date   NA 146 (H) 03/26/2018   K 4.0 03/26/2018   CO2 27 03/26/2018   GLUCOSE 96 03/26/2018   BUN 10 03/26/2018   CREATININE 0.82 03/26/2018   BILITOT 0.6 03/26/2018   ALKPHOS 82 03/26/2018   AST 15 03/26/2018   ALT 15 03/26/2018   PROT 6.7 03/26/2018   ALBUMIN 4.0 03/26/2018   CALCIUM 9.2 03/26/2018   GFR 78.00 01/14/2017   Lab Results  Component Value Date   CHOL 150 08/11/2017   Lab Results  Component Value Date   HDL 52 08/11/2017   Lab Results  Component Value Date   LDLCALC 84 08/11/2017   Lab Results  Component Value Date   TRIG 68 08/11/2017   Lab Results    Component Value Date   CHOLHDL 2.9 08/11/2017   Lab Results  Component Value Date   HGBA1C 5.5 03/26/2018       Assessment & Plan:   Problem List Items Addressed This Visit    Hyperlipidemia, mixed    Encouraged heart healthy diet, increase exercise, avoid trans fats, consider a krill oil cap daily      Relevant Orders   Comprehensive metabolic panel   Lipid panel   TSH   Morbid obesity (HCC)    Encouraged DASH diet, decrease po intake and increase exercise as tolerated. Needs 7-8 hours of sleep nightly. Avoid trans fats, eat small, frequent meals every 4-5 hours with lean proteins, complex carbs and healthy fats. Minimize simple carbs      Relevant Orders   TSH   Vitamin D deficiency    Supplement and monitor      Relevant Orders   Vitamin D (25 hydroxy)   Hyperglycemia    hgba1c acceptable, minimize simple carbs. Increase exercise as tolerated.       Relevant Orders   Hemoglobin A1c   TSH   Insulin, random   Cough  Encouraged increased rest and hydration, add probiotics, zinc such as Coldeze or Xicam. Treat fevers as needed, elderberry, vitamin C, Mucinex can add Amoxicillin if worsens       Relevant Orders   CBC      I am having Terri [name] start on amoxicillin. I am also having her maintain her montelukast, oxybutynin, levocetirizine, tiZANidine, amLODipine-olmesartan, omeprazole, and Vitamin D (Ergocalciferol).  Meds ordered this encounter  Medications  . amoxicillin (AMOXIL) 500 MG capsule    Sig: Take 1 capsule (500 mg total) by mouth 3 (three) times daily.    Dispense:  30 capsule    Refill:  0     Danise EdgeStacey , MD

## 2018-07-13 NOTE — Assessment & Plan Note (Signed)
hgba1c acceptable, minimize simple carbs. Increase exercise as tolerated.  

## 2018-07-13 NOTE — Assessment & Plan Note (Addendum)
Encouraged increased rest and hydration, add probiotics, zinc such as Coldeze or Xicam. Treat fevers as needed, elderberry, vitamin C, Mucinex can add Amoxicillin if worsens

## 2018-07-13 NOTE — Patient Instructions (Addendum)
Encouraged increased rest and hydration, add probiotics, zinc such as Coldeze or Xicam. Treat fevers as needed, Elderberry, vitamin C and mucinex. Can use Amoxicillin if worsens  Tdap is due Prevnar is given first followed by Pneumovax a year later Shingrix is the new shingles shot 2 shots over 2-6 months      Call insurance regarding coverage and best location and document Can give at next visit, can get at pharmacy and/or can have a nurse appt here for shots only    Carbohydrate Counting for Diabetes Mellitus, Adult  Carbohydrate counting is a method of keeping track of how many carbohydrates you eat. Eating carbohydrates naturally increases the amount of sugar (glucose) in the blood. Counting how many carbohydrates you eat helps keep your blood glucose within normal limits, which helps you manage your diabetes (diabetes mellitus). It is important to know how many carbohydrates you can safely have in each meal. This is different for every person. A diet and nutrition specialist (registered dietitian) can help you make a meal plan and calculate how many carbohydrates you should have at each meal and snack. Carbohydrates are found in the following foods:  Grains, such as breads and cereals.  Dried beans and soy products.  Starchy vegetables, such as potatoes, peas, and corn.  Fruit and fruit juices.  Milk and yogurt.  Sweets and snack foods, such as cake, cookies, candy, chips, and soft drinks. How do I count carbohydrates? There are two ways to count carbohydrates in food. You can use either of the methods or a combination of both. Reading "Nutrition Facts" on packaged food The "Nutrition Facts" list is included on the labels of almost all packaged foods and beverages in the [place] It includes:  The serving size.  Information about nutrients in each serving, including the grams (g) of carbohydrate per serving. To use the "Nutrition Facts":  Decide how many servings you will  have.  Multiply the number of servings by the number of carbohydrates per serving.  The resulting number is the total amount of carbohydrates that you will be having. Learning standard serving sizes of other foods When you eat carbohydrate foods that are not packaged or do not include "Nutrition Facts" on the label, you need to measure the servings in order to count the amount of carbohydrates:  Measure the foods that you will eat with a food scale or measuring cup, if needed.  Decide how many standard-size servings you will eat.  Multiply the number of servings by 15. Most carbohydrate-rich foods have about 15 g of carbohydrates per serving. ? For example, if you eat 8 oz (170 g) of strawberries, you will have eaten 2 servings and 30 g of carbohydrates (2 servings x 15 g = 30 g).  For foods that have more than one food mixed, such as soups and casseroles, you must count the carbohydrates in each food that is included. The following list contains standard serving sizes of common carbohydrate-rich foods. Each of these servings has about 15 g of carbohydrates:   hamburger bun or  English muffin.   oz (15 mL) syrup.   oz (14 g) jelly.  1 slice of bread.  1 six-inch tortilla.  3 oz (85 g) cooked rice or pasta.  4 oz (113 g) cooked dried beans.  4 oz (113 g) starchy vegetable, such as peas, corn, or potatoes.  4 oz (113 g) hot cereal.  4 oz (113 g) mashed potatoes or  of a large baked potato.  4 oz (113 g) canned or frozen fruit.  4 oz (120 mL) fruit juice.  4-6 crackers.  6 chicken nuggets.  6 oz (170 g) unsweetened dry cereal.  6 oz (170 g) plain fat-free yogurt or yogurt sweetened with artificial sweeteners.  8 oz (240 mL) milk.  8 oz (170 g) fresh fruit or one small piece of fruit.  24 oz (680 g) popped popcorn. Example of carbohydrate counting Sample meal  3 oz (85 g) chicken breast.  6 oz (170 g) brown rice.  4 oz (113 g) corn.  8 oz (240 mL)  milk.  8 oz (170 g) strawberries with sugar-free whipped topping. Carbohydrate calculation 1. Identify the foods that contain carbohydrates: ? Rice. ? Corn. ? Milk. ? Strawberries. 2. Calculate how many servings you have of each food: ? 2 servings rice. ? 1 serving corn. ? 1 serving milk. ? 1 serving strawberries. 3. Multiply each number of servings by 15 g: ? 2 servings rice x 15 g = 30 g. ? 1 serving corn x 15 g = 15 g. ? 1 serving milk x 15 g = 15 g. ? 1 serving strawberries x 15 g = 15 g. 4. Add together all of the amounts to find the total grams of carbohydrates eaten: ? 30 g + 15 g + 15 g + 15 g = 75 g of carbohydrates total. Summary  Carbohydrate counting is a method of keeping track of how many carbohydrates you eat.  Eating carbohydrates naturally increases the amount of sugar (glucose) in the blood.  Counting how many carbohydrates you eat helps keep your blood glucose within normal limits, which helps you manage your diabetes.  A diet and nutrition specialist (registered dietitian) can help you make a meal plan and calculate how many carbohydrates you should have at each meal and snack. This information is not intended to replace advice given to you by your health care provider. Make sure you discuss any questions you have with your health care provider. Document Released: 05/20/2005 Document Revised: 11/27/2016 Document Reviewed: 11/01/2015 Elsevier Interactive Patient Education  2019 [employer]

## 2018-07-13 NOTE — Assessment & Plan Note (Signed)
Supplement and monitor 

## 2018-07-13 NOTE — Assessment & Plan Note (Signed)
Encouraged heart healthy diet, increase exercise, avoid trans fats, consider a krill oil cap daily 

## 2018-07-14 LAB — CBC
HCT: 38.1 % (ref 36.0–46.0)
Hemoglobin: 12.1 g/dL (ref 12.0–15.0)
MCHC: 31.8 g/dL (ref 30.0–36.0)
MCV: 93.2 fl (ref 78.0–100.0)
Platelets: 336 10*3/uL (ref 150.0–400.0)
RBC: 4.09 Mil/uL (ref 3.87–5.11)
RDW: 16 % — AB (ref 11.5–15.5)
WBC: 12.6 10*3/uL — ABNORMAL HIGH (ref 4.0–10.5)

## 2018-07-14 LAB — COMPREHENSIVE METABOLIC PANEL
ALT: 13 U/L (ref 0–35)
AST: 14 U/L (ref 0–37)
Albumin: 3.9 g/dL (ref 3.5–5.2)
Alkaline Phosphatase: 77 U/L (ref 39–117)
BILIRUBIN TOTAL: 0.6 mg/dL (ref 0.2–1.2)
BUN: 14 mg/dL (ref 6–23)
CO2: 30 meq/L (ref 19–32)
CREATININE: 0.93 mg/dL (ref 0.40–1.20)
Calcium: 9.3 mg/dL (ref 8.4–10.5)
Chloride: 108 mEq/L (ref 96–112)
GFR: 73.04 mL/min (ref >60.00)
GLUCOSE: 95 mg/dL (ref 70–99)
Potassium: 4.7 mEq/L (ref 3.5–5.1)
SODIUM: 145 meq/L (ref 135–145)
Total Protein: 6.6 g/dL (ref 6.0–8.3)

## 2018-07-14 LAB — TSH: TSH: 1.96 u[IU]/mL (ref 0.35–4.50)

## 2018-07-14 LAB — LIPID PANEL
CHOL/HDL RATIO: 3
Cholesterol: 140 mg/dL (ref 0–200)
HDL: 43.6 mg/dL (ref >39.00)
LDL Cholesterol: 75 mg/dL (ref 0–99)
NONHDL: 96.13
Triglycerides: 104 mg/dL (ref 0.0–149.0)
VLDL: 20.8 mg/dL (ref 0.0–40.0)

## 2018-07-14 LAB — HEMOGLOBIN A1C: HEMOGLOBIN A1C: 5.6 % (ref 4.6–6.5)

## 2018-07-14 LAB — VITAMIN D 25 HYDROXY (VIT D DEFICIENCY, FRACTURES): VITD: 31.88 ng/mL (ref 30.00–100.00)

## 2018-07-15 LAB — INSULIN, RANDOM: INSULIN: 32.3 u[IU]/mL — AB (ref 2.0–19.6)

## 2018-07-20 ENCOUNTER — Encounter (INDEPENDENT_AMBULATORY_CARE_PROVIDER_SITE_OTHER): Payer: Self-pay | Admitting: Physician Assistant

## 2018-07-20 ENCOUNTER — Ambulatory Visit (INDEPENDENT_AMBULATORY_CARE_PROVIDER_SITE_OTHER): Payer: BC Managed Care – PPO | Admitting: Physician Assistant

## 2018-07-20 VITALS — BP 125/75 | HR 65 | Temp 98.5°F | Ht 64.0 in | Wt 255.0 lb

## 2018-07-20 DIAGNOSIS — E8881 Metabolic syndrome: Secondary | ICD-10-CM

## 2018-07-20 DIAGNOSIS — Z9189 Other specified personal risk factors, not elsewhere classified: Secondary | ICD-10-CM | POA: Diagnosis not present

## 2018-07-20 DIAGNOSIS — E559 Vitamin D deficiency, unspecified: Secondary | ICD-10-CM

## 2018-07-20 DIAGNOSIS — Z6841 Body Mass Index (BMI) 40.0 and over, adult: Secondary | ICD-10-CM

## 2018-07-20 DIAGNOSIS — E88819 Insulin resistance, unspecified: Secondary | ICD-10-CM

## 2018-07-20 MED ORDER — METFORMIN HCL 500 MG PO TABS: 500.0000 mg | ORAL_TABLET | Freq: Every day | ORAL | 0 refills | Status: DC

## 2018-07-20 NOTE — Progress Notes (Signed)
Office: [phone]  /  Fax: [phone]   HPI:   Chief Complaint: OBESITY Terri Terri [name] is here to discuss her progress with her obesity treatment plan. She is on the Category 2 plan and is following her eating plan approximately 20 % of the time. She states she is exercising 0 minutes 0 times per week. Terri Terri [name] reports that she has stopped drinking soda but is still drinking sweet tea. She reports that her hunger is controlled and she is not having cravings. Her weight is 255 lb (115.7 kg) today and has had a weight loss of 0 lbs pounds over a period of 4 weeks since her last visit. She has lost 6 lbs since starting treatment with [name].  Insulin Resistance Terri [name] has a diagnosis of insulin resistance based on her elevated fasting insulin level >5. Although Terri Terri [name]'s blood glucose readings are still under good control, insulin resistance puts her at greater risk of metabolic syndrome and diabetes. She is not taking metformin currently and continues to work on diet and exercise to decrease risk of diabetes. Her last insulin level had increased.   Vitamin D deficiency Terri Terri [name] has a diagnosis of vitamin D deficiency. She is currently taking prescription Vit D and denies nausea, vomiting or muscle weakness. Her last level was not at goal.  At risk for diabetes Terri Terri [name] is at higher than average risk for developing diabetes due to her obesity. She currently denies polyuria or polydipsia.  ASSESSMENT AND PLAN:  Insulin resistance - Plan: metFORMIN (GLUCOPHAGE) 500 MG tablet  Vitamin D deficiency  At risk for diabetes mellitus  Class 3 severe obesity with serious comorbidity and body mass index (BMI) of 40.0 to 44.9 in adult, unspecified obesity type (HCC)  PLAN:  Insulin Resistance Terri Terri [name] will continue to work on weight loss, exercise, and decreasing simple carbohydrates in her diet to help decrease the risk of diabetes. We dicussed metformin including benefits and risks. She was informed that  eating too many simple carbohydrates or too many calories at one sitting increases the likelihood of GI side effects. Brette agrees to begin taking metformin 500 mg qam #30 with no refills for now and prescription was written today. Terri Terri [name] agrees to follow up with our clinic in 4 weeks.  Vitamin D Deficiency Terri Terri [name] was informed that low vitamin D levels contributes to fatigue and are associated with obesity, breast, and colon cancer. She agrees to continue to take prescription Vit D 50,000 IU every week and will follow up for routine testing of vitamin D, at least 2-3 times per year. She was informed of the risk of over-replacement of vitamin D and agrees to not increase her dose unless she discusses this with [name] first. Terri Terri [name] agrees to follow up with our clinic in 4 weeks.  Diabetes risk counseling Terri Terri [name] was given extended (15 minutes) diabetes prevention counseling today. She is 68 y.o. female and has risk factors for diabetes including obesity. We discussed intensive lifestyle modifications today with an emphasis on weight loss as well as increasing exercise and decreasing simple carbohydrates in her diet.  Obesity Terri [name] is currently in the action stage of change. As such, her goal is to continue with weight loss efforts She has agreed to follow the Category 2 plan Terri Terri [name] has been instructed to work up to a goal of 150 minutes of combined cardio and strengthening exercise per week for weight loss and overall health benefits. We discussed the following Behavioral Modification Strategies today: work on meal planning and easy cooking plans  Terri Terri [name] has agreed to follow up with our clinic in 4 weeks. She was informed of the importance of frequent follow up visits to maximize her success with intensive lifestyle modifications for her multiple health conditions.  ALLERGIES: Allergies  Allergen Reactions  . Sulfonamide Derivatives     MEDICATIONS: Current Outpatient Medications on File Prior to  Visit  Medication Sig Dispense Refill  . amLODipine-olmesartan (AZOR) 10-40 MG tablet TAKE 1 TABLET BY MOUTH EVERY DAY 90 tablet 1  . amoxicillin (AMOXIL) 500 MG capsule Take 1 capsule (500 mg total) by mouth 3 (three) times daily. 30 capsule 0  . levocetirizine (XYZAL) 5 MG tablet Take 5 mg by mouth every evening.    . montelukast (SINGULAIR) 10 MG tablet Take 1 tablet (10 mg total) by mouth at bedtime as needed. 30 tablet 5  . omeprazole (PRILOSEC) 40 MG capsule TAKE ONE CAPSULE BY MOUTH EVERY DAY 90 capsule 0  . oxybutynin (DITROPAN-XL) 10 MG 24 hr tablet Take 10 mg by mouth at bedtime.    Marland Kitchen. tiZANidine (ZANAFLEX) 4 MG tablet Take 0.5-1 tablets (2-4 mg total) by mouth every 6 (six) hours as needed for muscle spasms. 30 tablet 1  . Vitamin D, Ergocalciferol, (DRISDOL) 1.25 MG (50000 UT) CAPS capsule Take 1 capsule (50,000 Units total) by mouth every 7 (seven) days. 12 capsule 0   No current facility-administered medications on file prior to visit.     PAST MEDICAL HISTORY: Past Medical History:  Diagnosis Date  . Allergic rhinitis   . Apnea, sleep 06/28/2012   Previously used CPAP  . Asthma   . Atypical chest pain 01/14/2017  . Back pain   . Decreased visual acuity 08/05/2016  . Depression 06/19/2015  . Environmental and seasonal allergies   . GERD (gastroesophageal reflux disease)   . Heartburn   . Hip pain, right 06/28/2012  . HTN (hypertension)   . Hypokalemia 01/14/2017  . Leg pain   . Morbid obesity (HCC) 04/11/2008   Qualifier: Diagnosis of  By: Nena JordanYoo DO, D. Robert   . Numbness in feet 05/06/2016  . Obesity   . Osteoarthritis 01/16/2010   Qualifier: Diagnosis of  By: Nena JordanYoo DO, D. Robert   . Preventative health care 06/25/2015  . Shortness of breath   . Shortness of breath on exertion   . Sleep apnea   . Swelling of extremity   . Tinnitus   . Vertigo     PAST SURGICAL HISTORY: Past Surgical History:  Procedure Laterality Date  . BIOPSY BREAST  1997   left   . childbirth        SOCIAL HISTORY: Social History   Tobacco Use  . Smoking status: Never Smoker  . Smokeless tobacco: Never Used  Substance Use Topics  . Alcohol use: Yes    Comment: social  . Drug use: No    FAMILY HISTORY: Family History  Problem Relation Age of Onset  . Stroke Mother   . Liver disease Mother        hepatitis b  . Hypertension Mother   . Cancer Father        esophagus, prostate  . Hypertension Father   . Heart disease Father   . Liver disease Brother        hep c  . Mental illness Brother        PTSD from [place]  . Stroke Maternal Grandmother   . Cancer Paternal Grandfather        prostate  ROS: Review of Systems  Constitutional: Negative for weight loss.  Gastrointestinal: Negative for nausea and vomiting.  Genitourinary:       Negative for polyuria  Musculoskeletal:       Negative for muscle weakness  Endo/Heme/Allergies: Negative for polydipsia.       Negative for polyphagia    PHYSICAL EXAM: Blood pressure 125/75, pulse 65, temperature 98.5 F (36.9 C), temperature source Oral, height 5\' 4"  (1.626 m), weight 255 lb (115.7 kg), SpO2 100 %. Body mass index is 43.77 kg/m. Physical Exam Vitals signs reviewed.  Constitutional:      Appearance: Normal appearance. She is obese.  Cardiovascular:     Rate and Rhythm: Normal rate.     Pulses: Normal pulses.  Pulmonary:     Effort: Pulmonary effort is normal.  Musculoskeletal: Normal range of motion.  Skin:    General: Skin is warm and dry.  Neurological:     Mental Status: She is alert and oriented to person, place, and time.  Psychiatric:        Mood and Affect: Mood normal.        Behavior: Behavior normal.     RECENT LABS AND TESTS: BMET    Component Value Date/Time   NA 145 07/13/2018 1705   NA 146 (H) 03/26/2018 1315   K 4.7 07/13/2018 1705   CL 108 07/13/2018 1705   CO2 30 07/13/2018 1705   GLUCOSE 95 07/13/2018 1705   BUN 14 07/13/2018 1705   BUN 10 03/26/2018 1315    CREATININE 0.93 07/13/2018 1705   CREATININE 0.85 12/09/2013 1453   CALCIUM 9.3 07/13/2018 1705   GFRNONAA 75 03/26/2018 1315   GFRAA 87 03/26/2018 1315   Lab Results  Component Value Date   HGBA1C 5.6 07/13/2018   HGBA1C 5.5 03/26/2018   HGBA1C 5.5 08/11/2017   HGBA1C 5.4 01/18/2009   HGBA1C 5.5 04/11/2008   Lab Results  Component Value Date   INSULIN 12.5 03/26/2018   INSULIN 21.8 08/11/2017   CBC    Component Value Date/Time   WBC 12.6 (H) 07/13/2018 1705   RBC 4.09 07/13/2018 1705   HGB 12.1 07/13/2018 1705   HGB 13.3 08/11/2017 1155   HCT 38.1 07/13/2018 1705   HCT 40.8 08/11/2017 1155   PLT 336.0 07/13/2018 1705   PLT 358 08/11/2017 1155   MCV 93.2 07/13/2018 1705   MCV 94 08/11/2017 1155   MCH 30.6 08/11/2017 1155   MCH 30.9 12/09/2013 1453   MCHC 31.8 07/13/2018 1705   RDW 16.0 (H) 07/13/2018 1705   RDW 14.8 08/11/2017 1155   LYMPHSABS 2.2 08/11/2017 1155   MONOABS 0.8 12/21/2010 1107   EOSABS 0.4 08/11/2017 1155   BASOSABS 0.0 08/11/2017 1155   Iron/TIBC/Ferritin/ %Sat No results found for: IRON, TIBC, FERRITIN, IRONPCTSAT Lipid Panel     Component Value Date/Time   CHOL 140 07/13/2018 1705   CHOL 150 08/11/2017 1155   TRIG 104.0 07/13/2018 1705   HDL 43.60 07/13/2018 1705   HDL 52 08/11/2017 1155   CHOLHDL 3 07/13/2018 1705   VLDL 20.8 07/13/2018 1705   LDLCALC 75 07/13/2018 1705   LDLCALC 84 08/11/2017 1155   Hepatic Function Panel     Component Value Date/Time   PROT 6.6 07/13/2018 1705   PROT 6.7 03/26/2018 1315   ALBUMIN 3.9 07/13/2018 1705   ALBUMIN 4.0 03/26/2018 1315   AST 14 07/13/2018 1705   ALT 13 07/13/2018 1705   ALKPHOS 77 07/13/2018 1705   BILITOT  0.6 07/13/2018 1705   BILITOT 0.6 03/26/2018 1315   BILIDIR 0.1 12/09/2013 1453   IBILI 0.4 12/09/2013 1453      Component Value Date/Time   TSH 1.96 07/13/2018 1705   TSH 2.050 08/11/2017 1155   TSH 2.18 01/10/2017 1404    Ref. Range 03/26/2018 13:15  Vitamin D,  25-Hydroxy Latest Ref Range: 30.0 - 100.0 ng/mL 24.0 (L)     OBESITY BEHAVIORAL INTERVENTION VISIT  Today's visit was # 13   Starting weight: 261 lbs Starting date: 08/11/2017 Today's weight :: 255 lbs Today's date: 07/20/2018 Total lbs lost to date: 6   ASK: We discussed the diagnosis of obesity with Terri Terri [name] today and Terri Terri [name] agreed to give [name] permission to discuss obesity behavioral modification therapy today.  ASSESS: Terri Terri [name] has the diagnosis of obesity and her BMI today is 43.75 Terri Terri [name] is in the action stage of change   ADVISE: Terri Terri [name] was educated on the multiple health risks of obesity as well as the benefit of weight loss to improve her health. She was advised of the need for long term treatment and the importance of lifestyle modifications to improve her current health and to decrease her risk of future health problems.  AGREE: Multiple dietary modification options and treatment options were discussed and  Terri Terri [name] agreed to follow the recommendations documented in the above note.  ARRANGE: Terri Terri [name] was educated on the importance of frequent visits to treat obesity as outlined per CMS and USPSTF guidelines and agreed to schedule her next follow up appointment today.  I, Tammy Wysor, am acting as Energy manager for [employer] I, Alois Cliche, PA-C have reviewed above note and agree with its content

## 2018-07-24 ENCOUNTER — Ambulatory Visit (INDEPENDENT_AMBULATORY_CARE_PROVIDER_SITE_OTHER): Payer: BC Managed Care – PPO

## 2018-07-24 ENCOUNTER — Encounter: Payer: Self-pay | Admitting: Nurse Practitioner

## 2018-07-24 ENCOUNTER — Ambulatory Visit: Payer: BC Managed Care – PPO | Admitting: Nurse Practitioner

## 2018-07-24 VITALS — BP 126/74 | HR 84 | Temp 99.6°F | Ht 64.0 in | Wt 260.0 lb

## 2018-07-24 DIAGNOSIS — R6883 Chills (without fever): Secondary | ICD-10-CM

## 2018-07-24 DIAGNOSIS — J209 Acute bronchitis, unspecified: Secondary | ICD-10-CM

## 2018-07-24 MED ORDER — METHYLPREDNISOLONE ACETATE 40 MG/ML IJ SUSP
40.0000 mg | Freq: Once | INTRAMUSCULAR | Status: AC
  Administered 2018-07-24: 40 mg via INTRAMUSCULAR

## 2018-07-24 MED ORDER — BENZONATATE 100 MG PO CAPS: 100.0000 mg | ORAL_CAPSULE | Freq: Three times a day (TID) | ORAL | 0 refills | Status: DC | PRN

## 2018-07-24 MED ORDER — PREDNISONE 20 MG PO TABS: 40.0000 mg | ORAL_TABLET | Freq: Every day | ORAL | 0 refills | Status: DC

## 2018-07-24 MED ORDER — ALBUTEROL SULFATE HFA 108 (90 BASE) MCG/ACT IN AERS: 1.0000 | INHALATION_SPRAY | Freq: Four times a day (QID) | RESPIRATORY_TRACT | 0 refills | Status: DC | PRN

## 2018-07-24 NOTE — Patient Instructions (Addendum)
CXR indicates bronchitis. No pneumonia. Sent additional oral prednisone to start tomorrow with food. Use in combination with medications prescribed today  Maintain adequate oral hydration.  May also use robitussin DM or delsym OTC for cough.  Stop oral decongestants.
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
## 2018-08-17 ENCOUNTER — Ambulatory Visit (INDEPENDENT_AMBULATORY_CARE_PROVIDER_SITE_OTHER): Payer: Self-pay | Admitting: Physician Assistant

## 2018-08-26 ENCOUNTER — Other Ambulatory Visit: Payer: Self-pay

## 2018-08-26 ENCOUNTER — Ambulatory Visit (INDEPENDENT_AMBULATORY_CARE_PROVIDER_SITE_OTHER): Payer: BC Managed Care – PPO | Admitting: Physician Assistant

## 2018-08-26 ENCOUNTER — Encounter (INDEPENDENT_AMBULATORY_CARE_PROVIDER_SITE_OTHER): Payer: Self-pay | Admitting: Physician Assistant

## 2018-08-26 DIAGNOSIS — Z6841 Body Mass Index (BMI) 40.0 and over, adult: Secondary | ICD-10-CM | POA: Diagnosis not present

## 2018-08-26 DIAGNOSIS — E8881 Metabolic syndrome: Secondary | ICD-10-CM

## 2018-08-26 DIAGNOSIS — E88819 Insulin resistance, unspecified: Secondary | ICD-10-CM

## 2018-08-27 ENCOUNTER — Encounter (INDEPENDENT_AMBULATORY_CARE_PROVIDER_SITE_OTHER): Payer: Self-pay

## 2018-08-27 MED ORDER — METFORMIN HCL 500 MG PO TABS: 500.0000 mg | ORAL_TABLET | Freq: Every day | ORAL | 0 refills | Status: DC

## 2018-08-27 NOTE — Progress Notes (Signed)
Office: [phone]: [phone] TeleHealth Visit:  Terri [name] has consented to this TeleHealth visit today via telephone call. The patient is located at home, the provider is located at the [facility] office. The participants in this visit include the listed provider and patient and any and all parties involved.   HPI:   Chief Complaint: OBESITY Terri [name] is here to discuss her progress with her obesity treatment plan. She is on the Category 2 plan and is following her eating plan approximately 25 % of the time. She states she is exercising 0 minutes 0 times per week. Terri [name] reports that she thinks that she has lost 3 to 5 pounds, as her pants are getting bigger. She has not been drinking sodas and she is cutting out other sugary drinks. We were unable to weight the patient today for this TeleHealth visit.She feels as if she has maintained weight since her last visit. She has lost 6 lbs since starting treatment with Terri [name].  Insulin Resistance [name] has a diagnosis of insulin resistance based on her elevated fasting insulin level >5. Although [name]'s blood glucose readings are still under good control, insulin resistance puts her at greater risk of metabolic syndrome and diabetes. She denies any nausea, vomiting or diarrhea on metformin. [name]  continues to work on diet and exercise to decrease risk of diabetes.  ASSESSMENT AND PLAN:  Insulin resistance - Plan: metFORMIN (GLUCOPHAGE) 500 MG tablet  Class 3 severe obesity with serious comorbidity and body mass index (BMI) of 40.0 to 44.9 in adult, unspecified obesity type (HCC)  PLAN:  Insulin Resistance Terri [name] will continue to work on weight loss, exercise, and decreasing simple carbohydrates in her diet to help decrease the risk of diabetes. We dicussed metformin including benefits and risks. She was informed that eating too many simple carbohydrates or too many calories at one sitting increases the likelihood  of GI side effects. Chris agreed to continue metformin 500 mg daily #30 with no refills and follow up with Terri [name] as directed to monitor her progress.  I spent > than 50% of the 25 minute visit on counseling as documented in the note.  Obesity Terri [name] is currently in the action stage of change. As such, her goal is to continue with weight loss efforts She has agreed to follow the Category 2 plan Terri [name] has been instructed to work up to a goal of 150 minutes of combined cardio and strengthening exercise per week for weight loss and overall health benefits. We discussed the following Behavioral Modification Strategies today: keeping healthy foods in the home and work on meal planning and easy cooking plans  Terri [name] has agreed to follow up with our clinic in 3 weeks. She was informed of the importance of frequent follow up visits to maximize her success with intensive lifestyle modifications for her multiple health conditions.  I spent > than 50% of the 25 minute visit on counseling as documented in the note.    ALLERGIES: Allergies  Allergen Reactions  . Sulfonamide Derivatives   . Sulfa Antibiotics Rash    MEDICATIONS: Current Outpatient Medications on File Prior to Visit  Medication Sig Dispense Refill  . albuterol (PROVENTIL HFA;VENTOLIN HFA) 108 (90 Base) MCG/ACT inhaler Inhale 1-2 puffs into the lungs every 6 (six) hours as needed. 1 Inhaler 0  . amLODipine-olmesartan (AZOR) 10-40 MG tablet TAKE 1 TABLET BY MOUTH EVERY DAY 90 tablet 1  . benzonatate (TESSALON) 100 MG capsule Take 1-2 capsules (100-200  mg total) by mouth 3 (three) times daily as needed for cough. 20 capsule 0  . levocetirizine (XYZAL) 5 MG tablet Take 5 mg by mouth every evening.    Marland Kitchen omeprazole (PRILOSEC) 40 MG capsule TAKE ONE CAPSULE BY MOUTH EVERY DAY 90 capsule 0  . oxybutynin (DITROPAN-XL) 10 MG 24 hr tablet Take 10 mg by mouth at bedtime.    . predniSONE (DELTASONE) 20 MG tablet Take 2 tablets (40 mg total) by  mouth daily with breakfast. 6 tablet 0  . Vitamin D, Ergocalciferol, (DRISDOL) 1.25 MG (50000 UT) CAPS capsule Take 1 capsule (50,000 Units total) by mouth every 7 (seven) days. 12 capsule 0   No current facility-administered medications on file prior to visit.     PAST MEDICAL HISTORY: Past Medical History:  Diagnosis Date  . Allergic rhinitis   . Apnea, sleep 06/28/2012   Previously used CPAP  . Asthma   . Atypical chest pain 01/14/2017  . Back pain   . Decreased visual acuity 08/05/2016  . Depression 06/19/2015  . Environmental and seasonal allergies   . GERD (gastroesophageal reflux disease)   . Heartburn   . Hip pain, right 06/28/2012  . HTN (hypertension)   . Hypokalemia 01/14/2017  . Leg pain   . Morbid obesity (HCC) 04/11/2008   Qualifier: Diagnosis of  By: [name]   . Numbness in feet 05/06/2016  . Obesity   . Osteoarthritis 01/16/2010   Qualifier: Diagnosis of  By: [name]   . Preventative health care 06/25/2015  . Shortness of breath   . Shortness of breath on exertion   . Sleep apnea   . Swelling of extremity   . Tinnitus   . Vertigo     PAST SURGICAL HISTORY: Past Surgical History:  Procedure Laterality Date  . BIOPSY BREAST  1997   left   . childbirth      SOCIAL HISTORY: Social History   Tobacco Use  . Smoking status: Never Smoker  . Smokeless tobacco: Never Used  Substance Use Topics  . Alcohol use: Yes    Comment: social  . Drug use: No    FAMILY HISTORY: Family History  Problem Relation Age of Onset  . Stroke Mother   . Liver disease Mother        hepatitis b  . Hypertension Mother   . Cancer Father        esophagus, prostate  . Hypertension Father   . Heart disease Father   . Liver disease Brother        hep c  . Mental illness Brother        PTSD from [place]  . Stroke Maternal Grandmother   . Cancer Paternal Grandfather        prostate    ROS: Review of Systems  Constitutional: Negative for weight loss.   Gastrointestinal: Negative for diarrhea, nausea and vomiting.    PHYSICAL EXAM: Pt in no acute distress  RECENT LABS AND TESTS: BMET    Component Value Date/Time   NA 145 07/13/2018 1705   NA 146 (H) 03/26/2018 1315   K 4.7 07/13/2018 1705   CL 108 07/13/2018 1705   CO2 30 07/13/2018 1705   GLUCOSE 95 07/13/2018 1705   BUN 14 07/13/2018 1705   BUN 10 03/26/2018 1315   CREATININE 0.93 07/13/2018 1705   CREATININE 0.85 12/09/2013 1453   CALCIUM 9.3 07/13/2018 1705   GFRNONAA 75 03/26/2018 1315   GFRAA 87  03/26/2018 1315   Lab Results  Component Value Date   HGBA1C 5.6 07/13/2018   HGBA1C 5.5 03/26/2018   HGBA1C 5.5 08/11/2017   HGBA1C 5.4 01/18/2009   HGBA1C 5.5 04/11/2008   Lab Results  Component Value Date   INSULIN 12.5 03/26/2018   INSULIN 21.8 08/11/2017   CBC    Component Value Date/Time   WBC 12.6 (H) 07/13/2018 1705   RBC 4.09 07/13/2018 1705   HGB 12.1 07/13/2018 1705   HGB 13.3 08/11/2017 1155   HCT 38.1 07/13/2018 1705   HCT 40.8 08/11/2017 1155   PLT 336.0 07/13/2018 1705   PLT 358 08/11/2017 1155   MCV 93.2 07/13/2018 1705   MCV 94 08/11/2017 1155   MCH 30.6 08/11/2017 1155   MCH 30.9 12/09/2013 1453   MCHC 31.8 07/13/2018 1705   RDW 16.0 (H) 07/13/2018 1705   RDW 14.8 08/11/2017 1155   LYMPHSABS 2.2 08/11/2017 1155   MONOABS 0.8 12/21/2010 1107   EOSABS 0.4 08/11/2017 1155   BASOSABS 0.0 08/11/2017 1155   Iron/TIBC/Ferritin/ %Sat No results found for: IRON, TIBC, FERRITIN, IRONPCTSAT Lipid Panel     Component Value Date/Time   CHOL 140 07/13/2018 1705   CHOL 150 08/11/2017 1155   TRIG 104.0 07/13/2018 1705   HDL 43.60 07/13/2018 1705   HDL 52 08/11/2017 1155   CHOLHDL 3 07/13/2018 1705   VLDL 20.8 07/13/2018 1705   LDLCALC 75 07/13/2018 1705   LDLCALC 84 08/11/2017 1155   Hepatic Function Panel     Component Value Date/Time   PROT 6.6 07/13/2018 1705   PROT 6.7 03/26/2018 1315   ALBUMIN 3.9 07/13/2018 1705   ALBUMIN 4.0  03/26/2018 1315   AST 14 07/13/2018 1705   ALT 13 07/13/2018 1705   ALKPHOS 77 07/13/2018 1705   BILITOT 0.6 07/13/2018 1705   BILITOT 0.6 03/26/2018 1315   BILIDIR 0.1 12/09/2013 1453   IBILI 0.4 12/09/2013 1453      Component Value Date/Time   TSH 1.96 07/13/2018 1705   TSH 2.050 08/11/2017 1155   TSH 2.18 01/10/2017 1404     Ref. Range 03/26/2018 13:15  Vitamin D, 25-Hydroxy Latest Ref Range: 30.0 - 100.0 ng/mL 24.0 (L)     I, [place] Crane, am acting as transcriptionist for [employer], PA-C  Alois Cliche, PA-C have reviewed above note and agree with its content

## 2018-09-14 ENCOUNTER — Other Ambulatory Visit: Payer: Self-pay

## 2018-09-14 ENCOUNTER — Ambulatory Visit (INDEPENDENT_AMBULATORY_CARE_PROVIDER_SITE_OTHER): Payer: BC Managed Care – PPO | Admitting: Physician Assistant

## 2018-09-14 ENCOUNTER — Encounter (INDEPENDENT_AMBULATORY_CARE_PROVIDER_SITE_OTHER): Payer: Self-pay | Admitting: Physician Assistant

## 2018-09-14 DIAGNOSIS — Z6841 Body Mass Index (BMI) 40.0 and over, adult: Secondary | ICD-10-CM

## 2018-09-14 DIAGNOSIS — E559 Vitamin D deficiency, unspecified: Secondary | ICD-10-CM

## 2018-09-14 NOTE — Progress Notes (Unsigned)
Office: [phone]  /  Fax: [phone] TeleHealth Visit:  Terri [name] has verbally consented to this TeleHealth visit today. The patient is located at home, the provider is located at the [facility] office. The participants in this visit include the listed provider and patient. The visit was conducted today via Webex.  HPI:   Chief Complaint: OBESITY Terri [name] is here to discuss her progress with her obesity treatment plan. She is on the Category 2 plan and is following her eating plan approximately 0 % of the time. She states she is exercising 0 minutes 0 times per week. Terri [name] reports that she has only been eating once a day since she needs to go food shopping. She continues to decrease her sweet tea and soda.  We were unable to weigh the patient today for this TeleHealth visit. She feels as if she has maintained weight since her last visit. She has lost 6 lbs since starting treatment with us.  Vitamin D Deficiency Terri [name] has a diagnosis of vitamin D deficiency. She is currently on vit D. Terri [name] denies nausea, vomiting, or muscle weakness.  ASSESSMENT AND PLAN:  Vitamin D deficiency  Class 3 severe obesity with serious comorbidity and body mass index (BMI) of 40.0 to 44.9 in adult, unspecified obesity type (HCC)  PLAN:  Vitamin D Deficiency Terri [name] was informed that low vitamin D levels contribute to fatigue and are associated with obesity, breast, and colon cancer. Terri [name] agrees to continue to take prescription Vit D @50 ,000 IU every week and will follow up for routine testing of vitamin D, at least 2-3 times per year. She was informed of the risk of over-replacement of vitamin D and agrees to not increase her dose unless she discusses this with us first. Terri [name] agrees to follow up in 2 weeks as directed.  Obesity Terri [name] is currently in the action stage of change. As such, her goal is to continue with weight loss efforts. She has agreed to follow the Category 2  plan. Terri [name] has been instructed to work up to a goal of 150 minutes of combined cardio and strengthening exercise per week for weight loss and overall health benefits. We discussed the following Behavioral Modification Strategies today: work on meal planning and easy cooking plans and no skipping meals.  Terri [name] has agreed to follow up with our clinic in 2 weeks. She was informed of the importance of frequent follow up visits to maximize her success with intensive lifestyle modifications for her multiple health conditions.  ALLERGIES: Allergies  Allergen Reactions  . Sulfonamide Derivatives   . Sulfa Antibiotics Rash    MEDICATIONS: Current Outpatient Medications on File Prior to Visit  Medication Sig Dispense Refill  . albuterol (PROVENTIL HFA;VENTOLIN HFA) 108 (90 Base) MCG/ACT inhaler Inhale 1-2 puffs into the lungs every 6 (six) hours as needed. 1 Inhaler 0  . amLODipine-olmesartan (AZOR) 10-40 MG tablet TAKE 1 TABLET BY MOUTH EVERY DAY 90 tablet 1  . benzonatate (TESSALON) 100 MG capsule Take 1-2 capsules (100-200 mg total) by mouth 3 (three) times daily as needed for cough. 20 capsule 0  . levocetirizine (XYZAL) 5 MG tablet Take 5 mg by mouth every evening.    . metFORMIN (GLUCOPHAGE) 500 MG tablet Take 1 tablet (500 mg total) by mouth daily with breakfast. 30 tablet 0  . omeprazole (PRILOSEC) 40 MG capsule TAKE ONE CAPSULE BY MOUTH EVERY DAY 90 capsule 0  . oxybutynin (DITROPAN-XL) 10 MG 24 hr tablet Take 10 mg by  mouth at bedtime.    . predniSONE (DELTASONE) 20 MG tablet Take 2 tablets (40 mg total) by mouth daily with breakfast. 6 tablet 0  . Vitamin D, Ergocalciferol, (DRISDOL) 1.25 MG (50000 UT) CAPS capsule Take 1 capsule (50,000 Units total) by mouth every 7 (seven) days. 12 capsule 0   No current facility-administered medications on file prior to visit.     PAST MEDICAL HISTORY: Past Medical History:  Diagnosis Date  . Allergic rhinitis   . Apnea, sleep 06/28/2012    Previously used CPAP  . Asthma   . Atypical chest pain 01/14/2017  . Back pain   . Decreased visual acuity 08/05/2016  . Depression 06/19/2015  . Environmental and seasonal allergies   . GERD (gastroesophageal reflux disease)   . Heartburn   . Hip pain, right 06/28/2012  . HTN (hypertension)   . Hypokalemia 01/14/2017  . Leg pain   . Morbid obesity (HCC) 04/11/2008   Qualifier: Diagnosis of  By: [name]   . Numbness in feet 05/06/2016  . Obesity   . Osteoarthritis 01/16/2010   Qualifier: Diagnosis of  By: [name]   . Preventative health care 06/25/2015  . Shortness of breath   . Shortness of breath on exertion   . Sleep apnea   . Swelling of extremity   . Tinnitus   . Vertigo     PAST SURGICAL HISTORY: Past Surgical History:  Procedure Laterality Date  . BIOPSY BREAST  1997   left   . childbirth      SOCIAL HISTORY: Social History   Tobacco Use  . Smoking status: Never Smoker  . Smokeless tobacco: Never Used  Substance Use Topics  . Alcohol use: Yes    Comment: social  . Drug use: No    FAMILY HISTORY: Family History  Problem Relation Age of Onset  . Stroke Mother   . Liver disease Mother        hepatitis b  . Hypertension Mother   . Cancer Father        esophagus, prostate  . Hypertension Father   . Heart disease Father   . Liver disease Brother        hep c  . Mental illness Brother        PTSD from [place]  . Stroke Maternal Grandmother   . Cancer Paternal Grandfather        prostate    ROS: Review of Systems  Gastrointestinal: Negative for nausea and vomiting.  Musculoskeletal:       Negative for muscle weakness.    PHYSICAL EXAM: Pt in no acute distress  RECENT LABS AND TESTS: BMET    Component Value Date/Time   NA 145 07/13/2018 1705   NA 146 (H) 03/26/2018 1315   K 4.7 07/13/2018 1705   CL 108 07/13/2018 1705   CO2 30 07/13/2018 1705   GLUCOSE 95 07/13/2018 1705   BUN 14 07/13/2018 1705   BUN 10 03/26/2018 1315    CREATININE 0.93 07/13/2018 1705   CREATININE 0.85 12/09/2013 1453   CALCIUM 9.3 07/13/2018 1705   GFRNONAA 75 03/26/2018 1315   GFRAA 87 03/26/2018 1315   Lab Results  Component Value Date   HGBA1C 5.6 07/13/2018   HGBA1C 5.5 03/26/2018   HGBA1C 5.5 08/11/2017   HGBA1C 5.4 01/18/2009   HGBA1C 5.5 04/11/2008   Lab Results  Component Value Date   INSULIN 12.5 03/26/2018   INSULIN 21.8 08/11/2017   CBC  Component Value Date/Time   WBC 12.6 (H) 07/13/2018 1705   RBC 4.09 07/13/2018 1705   HGB 12.1 07/13/2018 1705   HGB 13.3 08/11/2017 1155   HCT 38.1 07/13/2018 1705   HCT 40.8 08/11/2017 1155   PLT 336.0 07/13/2018 1705   PLT 358 08/11/2017 1155   MCV 93.2 07/13/2018 1705   MCV 94 08/11/2017 1155   MCH 30.6 08/11/2017 1155   MCH 30.9 12/09/2013 1453   MCHC 31.8 07/13/2018 1705   RDW 16.0 (H) 07/13/2018 1705   RDW 14.8 08/11/2017 1155   LYMPHSABS 2.2 08/11/2017 1155   MONOABS 0.8 12/21/2010 1107   EOSABS 0.4 08/11/2017 1155   BASOSABS 0.0 08/11/2017 1155   Iron/TIBC/Ferritin/ %Sat No results found for: IRON, TIBC, FERRITIN, IRONPCTSAT Lipid Panel     Component Value Date/Time   CHOL 140 07/13/2018 1705   CHOL 150 08/11/2017 1155   TRIG 104.0 07/13/2018 1705   HDL 43.60 07/13/2018 1705   HDL 52 08/11/2017 1155   CHOLHDL 3 07/13/2018 1705   VLDL 20.8 07/13/2018 1705   LDLCALC 75 07/13/2018 1705   LDLCALC 84 08/11/2017 1155   Hepatic Function Panel     Component Value Date/Time   PROT 6.6 07/13/2018 1705   PROT 6.7 03/26/2018 1315   ALBUMIN 3.9 07/13/2018 1705   ALBUMIN 4.0 03/26/2018 1315   AST 14 07/13/2018 1705   ALT 13 07/13/2018 1705   ALKPHOS 77 07/13/2018 1705   BILITOT 0.6 07/13/2018 1705   BILITOT 0.6 03/26/2018 1315   BILIDIR 0.1 12/09/2013 1453   IBILI 0.4 12/09/2013 1453      Component Value Date/Time   TSH 1.96 07/13/2018 1705   TSH 2.050 08/11/2017 1155   TSH 2.18 01/10/2017 1404   Results for ZAMAYAH, ENSOR (MRN [id number]) as  of 09/14/2018 14:18  Ref. Range 03/26/2018 13:15  Vitamin D, 25-Hydroxy Latest Ref Range: 30.0 - 100.0 ng/mL 24.0 (L)     I, Kirke Corin, CMA, am acting as transcriptionist for Wilder Glade, MD

## 2018-09-19 ENCOUNTER — Other Ambulatory Visit (INDEPENDENT_AMBULATORY_CARE_PROVIDER_SITE_OTHER): Payer: Self-pay | Admitting: Physician Assistant

## 2018-09-19 DIAGNOSIS — E88819 Insulin resistance, unspecified: Secondary | ICD-10-CM

## 2018-09-19 DIAGNOSIS — E8881 Metabolic syndrome: Secondary | ICD-10-CM

## 2018-09-29 ENCOUNTER — Encounter (INDEPENDENT_AMBULATORY_CARE_PROVIDER_SITE_OTHER): Payer: Self-pay | Admitting: Physician Assistant

## 2018-09-29 ENCOUNTER — Ambulatory Visit (INDEPENDENT_AMBULATORY_CARE_PROVIDER_SITE_OTHER): Payer: BC Managed Care – PPO | Admitting: Physician Assistant

## 2018-09-29 ENCOUNTER — Other Ambulatory Visit: Payer: Self-pay

## 2018-09-29 DIAGNOSIS — E559 Vitamin D deficiency, unspecified: Secondary | ICD-10-CM

## 2018-09-29 DIAGNOSIS — Z6841 Body Mass Index (BMI) 40.0 and over, adult: Secondary | ICD-10-CM | POA: Diagnosis not present

## 2018-09-29 DIAGNOSIS — E66813 Obesity, class 3: Secondary | ICD-10-CM

## 2018-09-30 NOTE — Progress Notes (Signed)
Office: [phone]  /  Fax: [phone] TeleHealth Visit:  Terri [name] has verbally consented to this TeleHealth visit today. The patient is located at home, the provider is located at the [facility] office. The participants in this visit include the listed provider and patient. The visit was conducted today via Webex.  HPI:   Chief Complaint: OBESITY Terri [name] is here to discuss her progress with her obesity treatment plan. She is on the Category 2 plan and is following her eating plan approximately 40% of the time. She states she is exercising utilizing a walking video 45 minutes 3 times per week. Terri [name] reports that she continues to struggle getting all of the meals in daily. She is drinking 2 regular sodas a week. We were unable to weigh the patient today for this TeleHealth visit. She feels as if she has maintained her weight since her last visit. She has lost 6 lbs since starting treatment with [name].  Vitamin D deficiency Terri [name] has a diagnosis of Vitamin D deficiency. She is currently taking Vit D and denies nausea, vomiting or muscle weakness.  ASSESSMENT AND PLAN:  Vitamin D deficiency  Class 3 severe obesity with serious comorbidity and body mass index (BMI) of 40.0 to 44.9 in adult, unspecified obesity type (HCC)  PLAN:  Vitamin D Deficiency Terri [name] was informed that low Vitamin D levels contributes to fatigue and are associated with obesity, breast, and colon cancer. She agrees to continue taking Vit D and will follow-up for routine testing of Vitamin D, at least 2-3 times per year. She was informed of the risk of over-replacement of Vitamin D and agrees to not increase her dose unless she discusses this with [name] first. Terri [name] agrees to follow-up with our clinic in 2 weeks.  Obesity [name] is currently in the action stage of change. As such, her goal is to continue with weight loss efforts. She has agreed to follow the Category 2 plan. Terri [name] has been  instructed to work up to a goal of 150 minutes of combined cardio and strengthening exercise per week for weight loss and overall health benefits. We discussed the following Behavioral Modification Strategies today: work on meal planning, easy cooking plans, and keeping healthy foods in the home.  Terri [name] has agreed to follow-up with our clinic in 2 weeks. She was informed of the importance of frequent follow-up visits to maximize her success with intensive lifestyle modifications for her multiple health conditions.  ALLERGIES: Allergies  Allergen Reactions  . Sulfonamide Derivatives   . Sulfa Antibiotics Rash    MEDICATIONS: Current Outpatient Medications on File Prior to Visit  Medication Sig Dispense Refill  . albuterol (PROVENTIL HFA;VENTOLIN HFA) 108 (90 Base) MCG/ACT inhaler Inhale 1-2 puffs into the lungs every 6 (six) hours as needed. 1 Inhaler 0  . amLODipine-olmesartan (AZOR) 10-40 MG tablet TAKE 1 TABLET BY MOUTH EVERY DAY 90 tablet 1  . benzonatate (TESSALON) 100 MG capsule Take 1-2 capsules (100-200 mg total) by mouth 3 (three) times daily as needed for cough. 20 capsule 0  . levocetirizine (XYZAL) 5 MG tablet Take 5 mg by mouth every evening.    . metFORMIN (GLUCOPHAGE) 500 MG tablet Take 1 tablet (500 mg total) by mouth daily with breakfast. 30 tablet 0  . omeprazole (PRILOSEC) 40 MG capsule TAKE ONE CAPSULE BY MOUTH EVERY DAY 90 capsule 0  . oxybutynin (DITROPAN-XL) 10 MG 24 hr tablet Take 10 mg by mouth at bedtime.    . predniSONE (DELTASONE)  20 MG tablet Take 2 tablets (40 mg total) by mouth daily with breakfast. 6 tablet 0  . Vitamin D, Ergocalciferol, (DRISDOL) 1.25 MG (50000 UT) CAPS capsule Take 1 capsule (50,000 Units total) by mouth every 7 (seven) days. 12 capsule 0   No current facility-administered medications on file prior to visit.     PAST MEDICAL HISTORY: Past Medical History:  Diagnosis Date  . Allergic rhinitis   . Apnea, sleep 06/28/2012   Previously  used CPAP  . Asthma   . Atypical chest pain 01/14/2017  . Back pain   . Decreased visual acuity 08/05/2016  . Depression 06/19/2015  . Environmental and seasonal allergies   . GERD (gastroesophageal reflux disease)   . Heartburn   . Hip pain, right 06/28/2012  . HTN (hypertension)   . Hypokalemia 01/14/2017  . Leg pain   . Morbid obesity (HCC) 04/11/2008   Qualifier: Diagnosis of  By: [name]   . Numbness in feet 05/06/2016  . Obesity   . Osteoarthritis 01/16/2010   Qualifier: Diagnosis of  By: [name]   . Preventative health care 06/25/2015  . Shortness of breath   . Shortness of breath on exertion   . Sleep apnea   . Swelling of extremity   . Tinnitus   . Vertigo     PAST SURGICAL HISTORY: Past Surgical History:  Procedure Laterality Date  . BIOPSY BREAST  1997   left   . childbirth      SOCIAL HISTORY: Social History   Tobacco Use  . Smoking status: Never Smoker  . Smokeless tobacco: Never Used  Substance Use Topics  . Alcohol use: Yes    Comment: social  . Drug use: No    FAMILY HISTORY: Family History  Problem Relation Age of Onset  . Stroke Mother   . Liver disease Mother        hepatitis b  . Hypertension Mother   . Cancer Father        esophagus, prostate  . Hypertension Father   . Heart disease Father   . Liver disease Brother        hep c  . Mental illness Brother        PTSD from [place]  . Stroke Maternal Grandmother   . Cancer Paternal Grandfather        prostate   ROS: Review of Systems  Gastrointestinal: Negative for nausea and vomiting.  Musculoskeletal:       Negative for muscle weakness.   PHYSICAL EXAM: Pt in no acute distress  RECENT LABS AND TESTS: BMET    Component Value Date/Time   NA 145 07/13/2018 1705   NA 146 (H) 03/26/2018 1315   K 4.7 07/13/2018 1705   CL 108 07/13/2018 1705   CO2 30 07/13/2018 1705   GLUCOSE 95 07/13/2018 1705   BUN 14 07/13/2018 1705   BUN 10 03/26/2018 1315   CREATININE  0.93 07/13/2018 1705   CREATININE 0.85 12/09/2013 1453   CALCIUM 9.3 07/13/2018 1705   GFRNONAA 75 03/26/2018 1315   GFRAA 87 03/26/2018 1315   Lab Results  Component Value Date   HGBA1C 5.6 07/13/2018   HGBA1C 5.5 03/26/2018   HGBA1C 5.5 08/11/2017   HGBA1C 5.4 01/18/2009   HGBA1C 5.5 04/11/2008   Lab Results  Component Value Date   INSULIN 12.5 03/26/2018   INSULIN 21.8 08/11/2017   CBC    Component Value Date/Time   WBC 12.6 (H)  07/13/2018 1705   RBC 4.09 07/13/2018 1705   HGB 12.1 07/13/2018 1705   HGB 13.3 08/11/2017 1155   HCT 38.1 07/13/2018 1705   HCT 40.8 08/11/2017 1155   PLT 336.0 07/13/2018 1705   PLT 358 08/11/2017 1155   MCV 93.2 07/13/2018 1705   MCV 94 08/11/2017 1155   MCH 30.6 08/11/2017 1155   MCH 30.9 12/09/2013 1453   MCHC 31.8 07/13/2018 1705   RDW 16.0 (H) 07/13/2018 1705   RDW 14.8 08/11/2017 1155   LYMPHSABS 2.2 08/11/2017 1155   MONOABS 0.8 12/21/2010 1107   EOSABS 0.4 08/11/2017 1155   BASOSABS 0.0 08/11/2017 1155   Iron/TIBC/Ferritin/ %Sat No results found for: IRON, TIBC, FERRITIN, IRONPCTSAT Lipid Panel     Component Value Date/Time   CHOL 140 07/13/2018 1705   CHOL 150 08/11/2017 1155   TRIG 104.0 07/13/2018 1705   HDL 43.60 07/13/2018 1705   HDL 52 08/11/2017 1155   CHOLHDL 3 07/13/2018 1705   VLDL 20.8 07/13/2018 1705   LDLCALC 75 07/13/2018 1705   LDLCALC 84 08/11/2017 1155   Hepatic Function Panel     Component Value Date/Time   PROT 6.6 07/13/2018 1705   PROT 6.7 03/26/2018 1315   ALBUMIN 3.9 07/13/2018 1705   ALBUMIN 4.0 03/26/2018 1315   AST 14 07/13/2018 1705   ALT 13 07/13/2018 1705   ALKPHOS 77 07/13/2018 1705   BILITOT 0.6 07/13/2018 1705   BILITOT 0.6 03/26/2018 1315   BILIDIR 0.1 12/09/2013 1453   IBILI 0.4 12/09/2013 1453      Component Value Date/Time   TSH 1.96 07/13/2018 1705   TSH 2.050 08/11/2017 1155   TSH 2.18 01/10/2017 1404   Results for Sharlyne PacasCARLSON, Siri E (MRN [id number]) as of 09/30/2018  09:21  Ref. Range 03/26/2018 13:15  Vitamin D, 25-Hydroxy Latest Ref Range: 30.0 - 100.0 ng/mL 24.0 (L)   I, Marianna Paymentenise Haag, am acting as Energy managertranscriptionist for [employer] Drelyn Pistilli, PA-C I, Alois Clicheracey Tiffancy Moger, PA-C have reviewed above note and agree with its content

## 2018-10-15 ENCOUNTER — Other Ambulatory Visit: Payer: Self-pay

## 2018-10-15 ENCOUNTER — Ambulatory Visit (INDEPENDENT_AMBULATORY_CARE_PROVIDER_SITE_OTHER): Payer: BC Managed Care – PPO | Admitting: Physician Assistant

## 2018-10-15 DIAGNOSIS — I10 Essential (primary) hypertension: Secondary | ICD-10-CM

## 2018-10-15 DIAGNOSIS — Z6841 Body Mass Index (BMI) 40.0 and over, adult: Secondary | ICD-10-CM | POA: Diagnosis not present

## 2018-10-15 DIAGNOSIS — E559 Vitamin D deficiency, unspecified: Secondary | ICD-10-CM | POA: Diagnosis not present

## 2018-10-15 MED ORDER — VITAMIN D (ERGOCALCIFEROL) 1.25 MG (50000 UNIT) PO CAPS: 50000.0000 [IU] | ORAL_CAPSULE | ORAL | 0 refills | Status: DC

## 2018-10-15 NOTE — Progress Notes (Signed)
Office: [phone]  /  Fax: [phone] TeleHealth Visit:  Terri [name] has verbally consented to this TeleHealth visit today. The patient is located at home, the provider is located at the [facility] office. The participants in this visit include the listed provider and patient. The visit was conducted today via Webex.  HPI:   Chief Complaint: OBESITY Terri [name] is here to discuss her progress with her obesity treatment plan. She is on the Category 2 plan and is following her eating plan approximately 50% of the time. She states she is exercising 0 minutes 0 times per week. [place]ale reports that she struggled the last few days due to numerous celebrations over [employer] Day weekend. She is ready to get back on track.  We were unable to weigh the patient today for this TeleHealth visit. She feels as if she has gained weight since her last visit. She has lost 6 lbs since starting treatment with [name].  Vitamin D deficiency Terri [name] has a diagnosis of Vitamin D deficiency. She is currently taking prescription Vit D and denies nausea, vomiting or muscle weakness.  Hypertension Terri [name] is a 68 y.o. female with hypertension and is on Azor.  Terri [name] denies chest pain or headache. She is working weight loss to help control her blood pressure with the goal of decreasing her risk of heart attack and stroke.  ASSESSMENT AND PLAN:  Vitamin D deficiency - Plan: Vitamin D, Ergocalciferol, (DRISDOL) 1.25 MG (50000 UT) CAPS capsule  Essential hypertension  Class 3 severe obesity with serious comorbidity and body mass index (BMI) of 40.0 to 44.9 in adult, unspecified obesity type (HCC)  PLAN:  Vitamin D Deficiency Terri [name] was informed that low Vitamin D levels contributes to fatigue and are associated with obesity, breast, and colon cancer. She agrees to continue to take prescription Vit D @ 50,000 IU every week #4 with 0 refills and will follow-up for routine testing of  Vitamin D, at least 2-3 times per year. She was informed of the risk of over-replacement of Vitamin D and agrees to not increase her dose unless she discusses this with [name] first. Terri [name] agrees to follow-up with our clinic in 2 weeks.  Hypertension We discussed sodium restriction, working on healthy weight loss, and a regular exercise program as the means to achieve improved blood pressure control. Terri [name] agreed with this plan and agreed to follow up as directed. We will continue to monitor her blood pressure as well as her progress with the above lifestyle modifications. She will continue her medications as prescribed and will watch for signs of hypotension as she continues her lifestyle modifications.  Obesity Terri [name] is currently in the action stage of change. As such, her goal is to continue with weight loss efforts. She has agreed to follow the Category 2 plan. Terri [name] has been instructed to work up to a goal of 150 minutes of combined cardio and strengthening exercise per week for weight loss and overall health benefits. We discussed the following Behavioral Modification Strategies today: work on meal planning, easy cooking plans, and keeping healthy foods in the home.  Terri [name] has agreed to follow-up with our clinic in 2 weeks. She was informed of the importance of frequent follow-up visits to maximize her success with intensive lifestyle modifications for her multiple health conditions.  ALLERGIES: Allergies  Allergen Reactions  . Sulfonamide Derivatives   . Sulfa Antibiotics Rash    MEDICATIONS: Current Outpatient Medications on File Prior to Visit  Medication Sig Dispense Refill  . albuterol (PROVENTIL HFA;VENTOLIN HFA) 108 (90 Base) MCG/ACT inhaler Inhale 1-2 puffs into the lungs every 6 (six) hours as needed. 1 Inhaler 0  . amLODipine-olmesartan (AZOR) 10-40 MG tablet TAKE 1 TABLET BY MOUTH EVERY DAY 90 tablet 1  . benzonatate (TESSALON) 100 MG capsule Take 1-2 capsules (100-200 mg  total) by mouth 3 (three) times daily as needed for cough. 20 capsule 0  . levocetirizine (XYZAL) 5 MG tablet Take 5 mg by mouth every evening.    . metFORMIN (GLUCOPHAGE) 500 MG tablet Take 1 tablet (500 mg total) by mouth daily with breakfast. 30 tablet 0  . omeprazole (PRILOSEC) 40 MG capsule TAKE ONE CAPSULE BY MOUTH EVERY DAY 90 capsule 0  . oxybutynin (DITROPAN-XL) 10 MG 24 hr tablet Take 10 mg by mouth at bedtime.    . predniSONE (DELTASONE) 20 MG tablet Take 2 tablets (40 mg total) by mouth daily with breakfast. 6 tablet 0   No current facility-administered medications on file prior to visit.     PAST MEDICAL HISTORY: Past Medical History:  Diagnosis Date  . Allergic rhinitis   . Apnea, sleep 06/28/2012   Previously used CPAP  . Asthma   . Atypical chest pain 01/14/2017  . Back pain   . Decreased visual acuity 08/05/2016  . Depression 06/19/2015  . Environmental and seasonal allergies   . GERD (gastroesophageal reflux disease)   . Heartburn   . Hip pain, right 06/28/2012  . HTN (hypertension)   . Hypokalemia 01/14/2017  . Leg pain   . Morbid obesity (HCC) 04/11/2008   Qualifier: Diagnosis of  By: [name]   . Numbness in feet 05/06/2016  . Obesity   . Osteoarthritis 01/16/2010   Qualifier: Diagnosis of  By: [name]   . Preventative health care 06/25/2015  . Shortness of breath   . Shortness of breath on exertion   . Sleep apnea   . Swelling of extremity   . Tinnitus   . Vertigo     PAST SURGICAL HISTORY: Past Surgical History:  Procedure Laterality Date  . BIOPSY BREAST  1997   left   . childbirth      SOCIAL HISTORY: Social History   Tobacco Use  . Smoking status: Never Smoker  . Smokeless tobacco: Never Used  Substance Use Topics  . Alcohol use: Yes    Comment: social  . Drug use: No    FAMILY HISTORY: Family History  Problem Relation Age of Onset  . Stroke Mother   . Liver disease Mother        hepatitis b  . Hypertension Mother    . Cancer Father        esophagus, prostate  . Hypertension Father   . Heart disease Father   . Liver disease Brother        hep c  . Mental illness Brother        PTSD from [place]  . Stroke Maternal Grandmother   . Cancer Paternal Grandfather        prostate   ROS: Review of Systems  Cardiovascular: Negative for chest pain.  Gastrointestinal: Negative for nausea and vomiting.  Musculoskeletal:       Negative for muscle weakness.  Neurological: Negative for headaches.   PHYSICAL EXAM: Pt in no acute distress  RECENT LABS AND TESTS: BMET    Component Value Date/Time   NA 145 07/13/2018 1705   NA 146 (H)  03/26/2018 1315   K 4.7 07/13/2018 1705   CL 108 07/13/2018 1705   CO2 30 07/13/2018 1705   GLUCOSE 95 07/13/2018 1705   BUN 14 07/13/2018 1705   BUN 10 03/26/2018 1315   CREATININE 0.93 07/13/2018 1705   CREATININE 0.85 12/09/2013 1453   CALCIUM 9.3 07/13/2018 1705   GFRNONAA 75 03/26/2018 1315   GFRAA 87 03/26/2018 1315   Lab Results  Component Value Date   HGBA1C 5.6 07/13/2018   HGBA1C 5.5 03/26/2018   HGBA1C 5.5 08/11/2017   HGBA1C 5.4 01/18/2009   HGBA1C 5.5 04/11/2008   Lab Results  Component Value Date   INSULIN 12.5 03/26/2018   INSULIN 21.8 08/11/2017   CBC    Component Value Date/Time   WBC 12.6 (H) 07/13/2018 1705   RBC 4.09 07/13/2018 1705   HGB 12.1 07/13/2018 1705   HGB 13.3 08/11/2017 1155   HCT 38.1 07/13/2018 1705   HCT 40.8 08/11/2017 1155   PLT 336.0 07/13/2018 1705   PLT 358 08/11/2017 1155   MCV 93.2 07/13/2018 1705   MCV 94 08/11/2017 1155   MCH 30.6 08/11/2017 1155   MCH 30.9 12/09/2013 1453   MCHC 31.8 07/13/2018 1705   RDW 16.0 (H) 07/13/2018 1705   RDW 14.8 08/11/2017 1155   LYMPHSABS 2.2 08/11/2017 1155   MONOABS 0.8 12/21/2010 1107   EOSABS 0.4 08/11/2017 1155   BASOSABS 0.0 08/11/2017 1155   Iron/TIBC/Ferritin/ %Sat No results found for: IRON, TIBC, FERRITIN, IRONPCTSAT Lipid Panel     Component Value  Date/Time   CHOL 140 07/13/2018 1705   CHOL 150 08/11/2017 1155   TRIG 104.0 07/13/2018 1705   HDL 43.60 07/13/2018 1705   HDL 52 08/11/2017 1155   CHOLHDL 3 07/13/2018 1705   VLDL 20.8 07/13/2018 1705   LDLCALC 75 07/13/2018 1705   LDLCALC 84 08/11/2017 1155   Hepatic Function Panel     Component Value Date/Time   PROT 6.6 07/13/2018 1705   PROT 6.7 03/26/2018 1315   ALBUMIN 3.9 07/13/2018 1705   ALBUMIN 4.0 03/26/2018 1315   AST 14 07/13/2018 1705   ALT 13 07/13/2018 1705   ALKPHOS 77 07/13/2018 1705   BILITOT 0.6 07/13/2018 1705   BILITOT 0.6 03/26/2018 1315   BILIDIR 0.1 12/09/2013 1453   IBILI 0.4 12/09/2013 1453      Component Value Date/Time   TSH 1.96 07/13/2018 1705   TSH 2.050 08/11/2017 1155   TSH 2.18 01/10/2017 1404   Results for Sharlyne PacasCARLSON, Terri E (MRN [id number]) as of 10/15/2018 15:12  Ref. Range 03/26/2018 13:15  Vitamin D, 25-Hydroxy Latest Ref Range: 30.0 - 100.0 ng/mL 24.0 (L)    I, Marianna Paymentenise Haag, am acting as Energy managertranscriptionist for [employer] Jaelani Posa, PA-C I, Alois Clicheracey Dalbert Stillings, PA-C have reviewed above note and agree with its content

## 2018-10-17 ENCOUNTER — Other Ambulatory Visit (INDEPENDENT_AMBULATORY_CARE_PROVIDER_SITE_OTHER): Payer: Self-pay | Admitting: Physician Assistant

## 2018-10-17 DIAGNOSIS — E8881 Metabolic syndrome: Secondary | ICD-10-CM

## 2018-10-17 DIAGNOSIS — E88819 Insulin resistance, unspecified: Secondary | ICD-10-CM

## 2018-11-02 ENCOUNTER — Ambulatory Visit (INDEPENDENT_AMBULATORY_CARE_PROVIDER_SITE_OTHER): Payer: BC Managed Care – PPO | Admitting: Physician Assistant

## 2018-11-05 ENCOUNTER — Ambulatory Visit (INDEPENDENT_AMBULATORY_CARE_PROVIDER_SITE_OTHER): Payer: BC Managed Care – PPO | Admitting: Physician Assistant

## 2018-11-05 ENCOUNTER — Other Ambulatory Visit: Payer: Self-pay

## 2018-11-05 ENCOUNTER — Encounter (INDEPENDENT_AMBULATORY_CARE_PROVIDER_SITE_OTHER): Payer: Self-pay | Admitting: Physician Assistant

## 2018-11-05 DIAGNOSIS — E8881 Metabolic syndrome: Secondary | ICD-10-CM

## 2018-11-05 DIAGNOSIS — E88819 Insulin resistance, unspecified: Secondary | ICD-10-CM

## 2018-11-05 DIAGNOSIS — Z6841 Body Mass Index (BMI) 40.0 and over, adult: Secondary | ICD-10-CM

## 2018-11-05 DIAGNOSIS — E559 Vitamin D deficiency, unspecified: Secondary | ICD-10-CM | POA: Diagnosis not present

## 2018-11-05 MED ORDER — VITAMIN D (ERGOCALCIFEROL) 1.25 MG (50000 UNIT) PO CAPS: 50000.0000 [IU] | ORAL_CAPSULE | ORAL | 0 refills | Status: DC

## 2018-11-05 NOTE — Progress Notes (Signed)
Office: [phone]  /  Fax: [phone] TeleHealth Visit:  Terri [name] has verbally consented to this TeleHealth visit today. The patient is located at home, the provider is located at the [facility] office. The participants in this visit include the listed provider and patient. The visit was conducted today via Webex.  HPI:   Chief Complaint: OBESITY Terri [name] is here to discuss her progress with her obesity treatment plan. She is on the Category 2 plan and is following her eating plan approximately 50% of the time. She states she is exercising 0 minutes 0 times per week. Terri [name] reports that she had quite a few people die recently that were close to her and she has not been following the plan.  We were unable to weigh the patient today for this TeleHealth visit. She feels as if she has maintained her weight since her last visit. She has lost 6 lbs since starting treatment with [name].  Vitamin D deficiency Terri [name] has a diagnosis of Vitamin D deficiency. She is currently taking prescription Vit D and denies nausea, vomiting or muscle weakness.  Insulin Resistance [name] has a diagnosis of insulin resistance based on her elevated fasting insulin level >5. Although Terri [name]'s blood glucose readings are still under good control, insulin resistance puts her at greater risk of metabolic syndrome and diabetes. She is on no medication and continues to work on diet and exercise to decrease risk of diabetes. No polyphagia.  ASSESSMENT AND PLAN:  Vitamin D deficiency - Plan: Vitamin D, Ergocalciferol, (DRISDOL) 1.25 MG (50000 UT) CAPS capsule  Insulin resistance  Class 3 severe obesity with serious comorbidity and body mass index (BMI) of 40.0 to 44.9 in adult, unspecified obesity type (HCC)  PLAN:  Vitamin D Deficiency Terri [name] was informed that low Vitamin D levels contributes to fatigue and are associated with obesity, breast, and colon cancer. She agrees to continue to take  prescription Vit D @ 50,000 IU every week #4 with 0 refills and will follow-up for routine testing of Vitamin D, at least 2-3 times per year. She was informed of the risk of over-replacement of Vitamin D and agrees to not increase her dose unless she discusses this with [name] first. Terri [name] agrees to follow-up with our clinic in 2 weeks.  Insulin Resistance Terri [name] will continue to work on weight loss, exercise, and decreasing simple carbohydrates in her diet to help decrease the risk of diabetes. We dicussed metformin including benefits and risks. She was informed that eating too many simple carbohydrates or too many calories at one sitting increases the likelihood of GI side effects. Terri [name] will continue with weight loss and follow-up with [name] as directed to monitor her progress.  Obesity Terri [name] is currently in the action stage of change. As such, her goal is to continue with weight loss efforts. She has agreed to follow the Category 2 plan. Terri [name] has been instructed to work up to a goal of 150 minutes of combined cardio and strengthening exercise per week for weight loss and overall health benefits. We discussed the following Behavioral Modification Strategies today: no skipping meals, work on meal planning and easy cooking plans.  Terri [name] has agreed to follow-up with our clinic in 2 weeks. She was informed of the importance of frequent follow-up visits to maximize her success with intensive lifestyle modifications for her multiple health conditions.  ALLERGIES: Allergies  Allergen Reactions  . Sulfonamide Derivatives   . Sulfa Antibiotics Rash    MEDICATIONS: Current Outpatient Medications  on File Prior to Visit  Medication Sig Dispense Refill  . albuterol (PROVENTIL HFA;VENTOLIN HFA) 108 (90 Base) MCG/ACT inhaler Inhale 1-2 puffs into the lungs every 6 (six) hours as needed. 1 Inhaler 0  . amLODipine-olmesartan (AZOR) 10-40 MG tablet TAKE 1 TABLET BY MOUTH EVERY DAY 90 tablet 1  .  levocetirizine (XYZAL) 5 MG tablet Take 5 mg by mouth every evening.    Terri [name] omeprazole (PRILOSEC) 40 MG capsule TAKE ONE CAPSULE BY MOUTH EVERY DAY 90 capsule 0  . oxybutynin (DITROPAN-XL) 10 MG 24 hr tablet Take 10 mg by mouth at bedtime.     No current facility-administered medications on file prior to visit.     PAST MEDICAL HISTORY: Past Medical History:  Diagnosis Date  . Allergic rhinitis   . Apnea, sleep 06/28/2012   Previously used CPAP  . Asthma   . Atypical chest pain 01/14/2017  . Back pain   . Decreased visual acuity 08/05/2016  . Depression 06/19/2015  . Environmental and seasonal allergies   . GERD (gastroesophageal reflux disease)   . Heartburn   . Hip pain, right 06/28/2012  . HTN (hypertension)   . Hypokalemia 01/14/2017  . Leg pain   . Morbid obesity (HCC) 04/11/2008   Qualifier: Diagnosis of  By: [name]   . Numbness in feet 05/06/2016  . Obesity   . Osteoarthritis 01/16/2010   Qualifier: Diagnosis of  By: [name]   . Preventative health care 06/25/2015  . Shortness of breath   . Shortness of breath on exertion   . Sleep apnea   . Swelling of extremity   . Tinnitus   . Vertigo     PAST SURGICAL HISTORY: Past Surgical History:  Procedure Laterality Date  . BIOPSY BREAST  1997   left   . childbirth      SOCIAL HISTORY: Social History   Tobacco Use  . Smoking status: Never Smoker  . Smokeless tobacco: Never Used  Substance Use Topics  . Alcohol use: Yes    Comment: social  . Drug use: No    FAMILY HISTORY: Family History  Problem Relation Age of Onset  . Stroke Mother   . Liver disease Mother        hepatitis b  . Hypertension Mother   . Cancer Father        esophagus, prostate  . Hypertension Father   . Heart disease Father   . Liver disease Brother        hep c  . Mental illness Brother        PTSD from [place]  . Stroke Maternal Grandmother   . Cancer Paternal Grandfather        prostate   ROS: Review of Systems   Gastrointestinal: Negative for nausea and vomiting.  Musculoskeletal:       Negative for muscle weakness.  Endo/Heme/Allergies:       Negative for polyphagia.   PHYSICAL EXAM: Pt in no acute distress  RECENT LABS AND TESTS: BMET    Component Value Date/Time   NA 145 07/13/2018 1705   NA 146 (H) 03/26/2018 1315   K 4.7 07/13/2018 1705   CL 108 07/13/2018 1705   CO2 30 07/13/2018 1705   GLUCOSE 95 07/13/2018 1705   BUN 14 07/13/2018 1705   BUN 10 03/26/2018 1315   CREATININE 0.93 07/13/2018 1705   CREATININE 0.85 12/09/2013 1453   CALCIUM 9.3 07/13/2018 1705   GFRNONAA 75 03/26/2018 1315  GFRAA 87 03/26/2018 1315   Lab Results  Component Value Date   HGBA1C 5.6 07/13/2018   HGBA1C 5.5 03/26/2018   HGBA1C 5.5 08/11/2017   HGBA1C 5.4 01/18/2009   HGBA1C 5.5 04/11/2008   Lab Results  Component Value Date   INSULIN 12.5 03/26/2018   INSULIN 21.8 08/11/2017   CBC    Component Value Date/Time   WBC 12.6 (H) 07/13/2018 1705   RBC 4.09 07/13/2018 1705   HGB 12.1 07/13/2018 1705   HGB 13.3 08/11/2017 1155   HCT 38.1 07/13/2018 1705   HCT 40.8 08/11/2017 1155   PLT 336.0 07/13/2018 1705   PLT 358 08/11/2017 1155   MCV 93.2 07/13/2018 1705   MCV 94 08/11/2017 1155   MCH 30.6 08/11/2017 1155   MCH 30.9 12/09/2013 1453   MCHC 31.8 07/13/2018 1705   RDW 16.0 (H) 07/13/2018 1705   RDW 14.8 08/11/2017 1155   LYMPHSABS 2.2 08/11/2017 1155   MONOABS 0.8 12/21/2010 1107   EOSABS 0.4 08/11/2017 1155   BASOSABS 0.0 08/11/2017 1155   Iron/TIBC/Ferritin/ %Sat No results found for: IRON, TIBC, FERRITIN, IRONPCTSAT Lipid Panel     Component Value Date/Time   CHOL 140 07/13/2018 1705   CHOL 150 08/11/2017 1155   TRIG 104.0 07/13/2018 1705   HDL 43.60 07/13/2018 1705   HDL 52 08/11/2017 1155   CHOLHDL 3 07/13/2018 1705   VLDL 20.8 07/13/2018 1705   LDLCALC 75 07/13/2018 1705   LDLCALC 84 08/11/2017 1155   Hepatic Function Panel     Component Value Date/Time    PROT 6.6 07/13/2018 1705   PROT 6.7 03/26/2018 1315   ALBUMIN 3.9 07/13/2018 1705   ALBUMIN 4.0 03/26/2018 1315   AST 14 07/13/2018 1705   ALT 13 07/13/2018 1705   ALKPHOS 77 07/13/2018 1705   BILITOT 0.6 07/13/2018 1705   BILITOT 0.6 03/26/2018 1315   BILIDIR 0.1 12/09/2013 1453   IBILI 0.4 12/09/2013 1453      Component Value Date/Time   TSH 1.96 07/13/2018 1705   TSH 2.050 08/11/2017 1155   TSH 2.18 01/10/2017 1404   Results for Sharlyne PacasCARLSON, Sacoya E (MRN [id number]) as of 11/05/2018 16:12  Ref. Range 03/26/2018 13:15  Vitamin D, 25-Hydroxy Latest Ref Range: 30.0 - 100.0 ng/mL 24.0 (L)    I, Marianna Paymentenise Haag, am acting as Energy managertranscriptionist for [employer] Seirra Kos, PA-C I, Alois Clicheracey Derrico Zhong, PA-C have reviewed above note and agree with its content

## 2018-11-15 ENCOUNTER — Other Ambulatory Visit: Payer: Self-pay | Admitting: Family Medicine

## 2018-11-18 ENCOUNTER — Other Ambulatory Visit: Payer: Self-pay

## 2018-11-18 ENCOUNTER — Ambulatory Visit (INDEPENDENT_AMBULATORY_CARE_PROVIDER_SITE_OTHER): Payer: BC Managed Care – PPO | Admitting: Physician Assistant

## 2018-11-18 VITALS — BP 108/70 | HR 75 | Temp 98.5°F | Ht 64.0 in | Wt 251.0 lb

## 2018-11-18 DIAGNOSIS — Z6841 Body Mass Index (BMI) 40.0 and over, adult: Secondary | ICD-10-CM | POA: Diagnosis not present

## 2018-11-18 DIAGNOSIS — E559 Vitamin D deficiency, unspecified: Secondary | ICD-10-CM

## 2018-11-19 NOTE — Progress Notes (Signed)
Office: [phone]  /  Fax: [phone]   HPI:   Chief Complaint: OBESITY Terri [name] is here to discuss her progress with her obesity treatment plan. She is on the  follow the Category 2 plan and is following her eating plan approximately 100 % of the time. She states she is exercising 0 minutes 0 times per week. Terri [name] reports that she has been eating breakfast everyday, but she is only eating one other meal. Her weight is 251 lb (113.9 kg) today and has had a weight loss of 4 pounds over a period of 2 weeks since her last visit. She has lost 10 lbs since starting treatment with us.  Vitamin D deficiency Terri [name] has a diagnosis of vitamin D deficiency. Terri [name] is currently on vit D and she denies nausea, vomiting or muscle weakness.  ASSESSMENT AND PLAN:  Vitamin D deficiency  Class 3 severe obesity with serious comorbidity and body mass index (BMI) of 40.0 to 44.9 in adult, unspecified obesity type (HCC)  PLAN:  Vitamin D Deficiency Terri [name] was informed that low vitamin D levels contributes to fatigue and are associated with obesity, breast, and colon cancer. She agrees to continue to take prescription Vit D @50 ,000 IU every week and will follow up for routine testing of vitamin D, at least 2-3 times per year. She was informed of the risk of over-replacement of vitamin D and agrees to not increase her dose unless she discusses this with us first.  I spent > than 50% of the 25 minute visit on counseling as documented in the note.  Obesity Terri [name] is currently in the action stage of change. As such, her goal is to continue with weight loss efforts She has agreed to follow the Category 2 plan Terri [name] has been instructed to work up to a goal of 150 minutes of combined cardio and strengthening exercise per week for weight loss and overall health benefits. We discussed the following Behavioral Modification Strategies today: increasing lean protein intake and work on meal planning and easy  cooking plans  Terri [name] has agreed to follow up with our clinic in 2 weeks. She was informed of the importance of frequent follow up visits to maximize her success with intensive lifestyle modifications for her multiple health conditions.  I spent > than 50% of the 25 minute visit on counseling as documented in the note.   ALLERGIES: Allergies  Allergen Reactions   Sulfonamide Derivatives    Sulfa Antibiotics Rash    MEDICATIONS: Current Outpatient Medications on File Prior to Visit  Medication Sig Dispense Refill   albuterol (PROVENTIL HFA;VENTOLIN HFA) 108 (90 Base) MCG/ACT inhaler Inhale 1-2 puffs into the lungs every 6 (six) hours as needed. 1 Inhaler 0   amLODipine-olmesartan (AZOR) 10-40 MG tablet TAKE 1 TABLET BY MOUTH EVERY DAY 90 tablet 1   levocetirizine (XYZAL) 5 MG tablet Take 5 mg by mouth every evening.     omeprazole (PRILOSEC) 40 MG capsule TAKE 1 CAPSULE BY MOUTH EVERY DAY 90 capsule 0   oxybutynin (DITROPAN-XL) 10 MG 24 hr tablet Take 10 mg by mouth at bedtime.     Vitamin D, Ergocalciferol, (DRISDOL) 1.25 MG (50000 UT) CAPS capsule Take 1 capsule (50,000 Units total) by mouth every 7 (seven) days. 4 capsule 0   No current facility-administered medications on file prior to visit.     PAST MEDICAL HISTORY: Past Medical History:  Diagnosis Date   Allergic rhinitis    Apnea, sleep 06/28/2012   Previously used CPAP  Asthma    Atypical chest pain 01/14/2017   Back pain    Decreased visual acuity 08/05/2016   Depression 06/19/2015   Environmental and seasonal allergies    GERD (gastroesophageal reflux disease)    Heartburn    Hip pain, right 06/28/2012   HTN (hypertension)    Hypokalemia 01/14/2017   Leg pain    Morbid obesity (HCC) 04/11/2008   Qualifier: Diagnosis of  By: Nena JordanYoo DO, D. Robert    Numbness in feet 05/06/2016   Obesity    Osteoarthritis 01/16/2010   Qualifier: Diagnosis of  By: Nena JordanYoo DO, D. Robert    Preventative health care  06/25/2015   Shortness of breath    Shortness of breath on exertion    Sleep apnea    Swelling of extremity    Tinnitus    Vertigo     PAST SURGICAL HISTORY: Past Surgical History:  Procedure Laterality Date   BIOPSY BREAST  1997   left    childbirth      SOCIAL HISTORY: Social History   Tobacco Use   Smoking status: Never Smoker   Smokeless tobacco: Never Used  Substance Use Topics   Alcohol use: Yes    Comment: social   Drug use: No    FAMILY HISTORY: Family History  Problem Relation Age of Onset   Stroke Mother    Liver disease Mother        hepatitis b   Hypertension Mother    Cancer Father        esophagus, prostate   Hypertension Father    Heart disease Father    Liver disease Brother        hep c   Mental illness Brother        PTSD from [place]   Stroke Maternal Grandmother    Cancer Paternal Grandfather        prostate    ROS: Review of Systems  Constitutional: Positive for weight loss.  Gastrointestinal: Negative for nausea and vomiting.  Musculoskeletal:       Negative for muscle weakness    PHYSICAL EXAM: Blood pressure 108/70, pulse 75, temperature 98.5 F (36.9 C), temperature source Oral, height 5\' 4"  (1.626 m), weight 251 lb (113.9 kg), SpO2 97 %. Body mass index is 43.08 kg/m. Physical Exam Vitals signs reviewed.  Constitutional:      Appearance: Normal appearance. She is well-developed. She is obese.  Cardiovascular:     Rate and Rhythm: Normal rate.  Pulmonary:     Effort: Pulmonary effort is normal.  Musculoskeletal: Normal range of motion.  Skin:    General: Skin is warm and dry.  Neurological:     Mental Status: She is alert and oriented to person, place, and time.  Psychiatric:        Mood and Affect: Mood normal.        Behavior: Behavior normal.     RECENT LABS AND TESTS: BMET    Component Value Date/Time   NA 145 07/13/2018 1705   NA 146 (H) 03/26/2018 1315   K 4.7 07/13/2018 1705    CL 108 07/13/2018 1705   CO2 30 07/13/2018 1705   GLUCOSE 95 07/13/2018 1705   BUN 14 07/13/2018 1705   BUN 10 03/26/2018 1315   CREATININE 0.93 07/13/2018 1705   CREATININE 0.85 12/09/2013 1453   CALCIUM 9.3 07/13/2018 1705   GFRNONAA 75 03/26/2018 1315   GFRAA 87 03/26/2018 1315   Lab Results  Component Value Date  HGBA1C 5.6 07/13/2018   HGBA1C 5.5 03/26/2018   HGBA1C 5.5 08/11/2017   HGBA1C 5.4 01/18/2009   HGBA1C 5.5 04/11/2008   Lab Results  Component Value Date   INSULIN 12.5 03/26/2018   INSULIN 21.8 08/11/2017   CBC    Component Value Date/Time   WBC 12.6 (H) 07/13/2018 1705   RBC 4.09 07/13/2018 1705   HGB 12.1 07/13/2018 1705   HGB 13.3 08/11/2017 1155   HCT 38.1 07/13/2018 1705   HCT 40.8 08/11/2017 1155   PLT 336.0 07/13/2018 1705   PLT 358 08/11/2017 1155   MCV 93.2 07/13/2018 1705   MCV 94 08/11/2017 1155   MCH 30.6 08/11/2017 1155   MCH 30.9 12/09/2013 1453   MCHC 31.8 07/13/2018 1705   RDW 16.0 (H) 07/13/2018 1705   RDW 14.8 08/11/2017 1155   LYMPHSABS 2.2 08/11/2017 1155   MONOABS 0.8 12/21/2010 1107   EOSABS 0.4 08/11/2017 1155   BASOSABS 0.0 08/11/2017 1155   Iron/TIBC/Ferritin/ %Sat No results found for: IRON, TIBC, FERRITIN, IRONPCTSAT Lipid Panel     Component Value Date/Time   CHOL 140 07/13/2018 1705   CHOL 150 08/11/2017 1155   TRIG 104.0 07/13/2018 1705   HDL 43.60 07/13/2018 1705   HDL 52 08/11/2017 1155   CHOLHDL 3 07/13/2018 1705   VLDL 20.8 07/13/2018 1705   LDLCALC 75 07/13/2018 1705   LDLCALC 84 08/11/2017 1155   Hepatic Function Panel     Component Value Date/Time   PROT 6.6 07/13/2018 1705   PROT 6.7 03/26/2018 1315   ALBUMIN 3.9 07/13/2018 1705   ALBUMIN 4.0 03/26/2018 1315   AST 14 07/13/2018 1705   ALT 13 07/13/2018 1705   ALKPHOS 77 07/13/2018 1705   BILITOT 0.6 07/13/2018 1705   BILITOT 0.6 03/26/2018 1315   BILIDIR 0.1 12/09/2013 1453   IBILI 0.4 12/09/2013 1453      Component Value Date/Time    TSH 1.96 07/13/2018 1705   TSH 2.050 08/11/2017 1155   TSH 2.18 01/10/2017 1404     Ref. Range 07/13/2018 17:05  VITD Latest Ref Range: 30.00 - 100.00 ng/mL 31.88    OBESITY BEHAVIORAL INTERVENTION VISIT  Today's visit was # 19   Starting weight: 261 lbs Starting date: 08/11/2017 Today's weight : 251 lbs  Today's date: 11/18/2018 Total lbs lost to date: 10     11/18/2018  Height 5\' 4"  (1.626 m)  Weight 251 lb (113.9 kg)  BMI (Calculated) 43.06  BLOOD PRESSURE - SYSTOLIC 108  BLOOD PRESSURE - DIASTOLIC 70   Body Fat % 50.8 %  Total Body Water (lbs) 92 lbs    ASK: We discussed the diagnosis of obesity with Terri [name] today and Terri [name] agreed to give us permission to discuss obesity behavioral modification therapy today.  ASSESS: Terri [name] has the diagnosis of obesity and her BMI today is 43.06 Terri [name] is in the action stage of change   ADVISE: Terri [name] was educated on the multiple health risks of obesity as well as the benefit of weight loss to improve her health. She was advised of the need for long term treatment and the importance of lifestyle modifications to improve her current health and to decrease her risk of future health problems.  AGREE: Multiple dietary modification options and treatment options were discussed and  Terri [name] agreed to follow the recommendations documented in the above note.  ARRANGE: Terri [name] was educated on the importance of frequent visits to treat obesity as outlined per CMS and USPSTF guidelines and  agreed to schedule her next follow up appointment today.  IDoreene Nest, am acting as transcriptionist for [employer], PA-C

## 2018-11-26 ENCOUNTER — Other Ambulatory Visit (INDEPENDENT_AMBULATORY_CARE_PROVIDER_SITE_OTHER): Payer: Self-pay | Admitting: Physician Assistant

## 2018-11-26 DIAGNOSIS — E559 Vitamin D deficiency, unspecified: Secondary | ICD-10-CM

## 2018-12-02 ENCOUNTER — Ambulatory Visit (INDEPENDENT_AMBULATORY_CARE_PROVIDER_SITE_OTHER): Payer: BC Managed Care – PPO | Admitting: Physician Assistant

## 2018-12-03 ENCOUNTER — Other Ambulatory Visit: Payer: Self-pay

## 2018-12-03 ENCOUNTER — Ambulatory Visit (INDEPENDENT_AMBULATORY_CARE_PROVIDER_SITE_OTHER): Payer: BC Managed Care – PPO | Admitting: Physician Assistant

## 2018-12-03 ENCOUNTER — Encounter (INDEPENDENT_AMBULATORY_CARE_PROVIDER_SITE_OTHER): Payer: Self-pay | Admitting: Physician Assistant

## 2018-12-03 VITALS — BP 141/79 | HR 64 | Temp 97.7°F | Ht 64.0 in | Wt 250.0 lb

## 2018-12-03 DIAGNOSIS — E8881 Metabolic syndrome: Secondary | ICD-10-CM

## 2018-12-03 DIAGNOSIS — E88819 Insulin resistance, unspecified: Secondary | ICD-10-CM

## 2018-12-03 DIAGNOSIS — Z9189 Other specified personal risk factors, not elsewhere classified: Secondary | ICD-10-CM | POA: Diagnosis not present

## 2018-12-03 DIAGNOSIS — E559 Vitamin D deficiency, unspecified: Secondary | ICD-10-CM | POA: Diagnosis not present

## 2018-12-03 DIAGNOSIS — Z6841 Body Mass Index (BMI) 40.0 and over, adult: Secondary | ICD-10-CM

## 2018-12-03 MED ORDER — VITAMIN D (ERGOCALCIFEROL) 1.25 MG (50000 UNIT) PO CAPS: 50000.0000 [IU] | ORAL_CAPSULE | ORAL | 0 refills | Status: DC

## 2018-12-08 NOTE — Progress Notes (Signed)
Office: [phone]  /  Fax: [phone]   HPI:   Chief Complaint: OBESITY Terri [name] is here to discuss her progress with her obesity treatment plan. She is on the Category 2 plan and is following her eating plan approximately 0 % of the time. She states she is exercising 0 minutes 0 times per week. Terri [name] reports that she has not been following the plan. Terri [name] has been getting up late and she is not eating breakfast. Her weight is 250 lb (113.4 kg) today and has had a weight loss of 1 pound over a period of 2 weeks since her last visit. She has lost 11 lbs since starting treatment with [name].  Vitamin D deficiency Terri [name] has a diagnosis of vitamin D deficiency. She is on weekly prescription vit D and denies nausea, vomiting or muscle weakness.  At risk for osteopenia and osteoporosis Terri [name] is at higher risk of osteopenia and osteoporosis due to vitamin D deficiency.   Insulin Resistance Terri [name] has a diagnosis of insulin resistance based on her elevated fasting insulin level >5. Although Terri [name]'s blood glucose readings are still under good control, insulin resistance puts her at greater risk of metabolic syndrome and diabetes. Terri [name] is not on medications currently and she continues to work on diet and exercise to decrease risk of diabetes. Terri [name] denies polyphagia.  ASSESSMENT AND PLAN:  Vitamin D deficiency - Plan: Vitamin D, Ergocalciferol, (DRISDOL) 1.25 MG (50000 UT) CAPS capsule  Insulin resistance  At risk for osteoporosis  Class 3 severe obesity with serious comorbidity and body mass index (BMI) of 40.0 to 44.9 in adult, unspecified obesity type ([place])  PLAN:  Vitamin D Deficiency Terri [name] was informed that low vitamin D levels contributes to fatigue and are associated with obesity, breast, and colon cancer. She agrees to continue to take prescription Vit D @50 ,000 IU every week #4 with no refills and will follow up for routine testing of vitamin D, at least 2-3 times per year.  She was informed of the risk of over-replacement of vitamin D and agrees to not increase her dose unless she discusses this with [name] first. Terri [name] agrees to follow up with our clinic in 3 weeks.  At risk for osteopenia and osteoporosis Terri [name] was given extended  (15 minutes) osteoporosis prevention counseling today. Terri [name] is at risk for osteopenia and osteoporosis due to her vitamin D deficiency. She was encouraged to take her vitamin D and follow her higher calcium diet and increase strengthening exercise to help strengthen her bones and decrease her risk of osteopenia and osteoporosis.  Insulin Resistance Terri [name] will continue to work on weight loss, exercise, and decreasing simple carbohydrates in her diet to help decrease the risk of diabetes. She was informed that eating too many simple carbohydrates or too many calories at one sitting increases the likelihood of GI side effects. Terri [name] agreed to follow up with [name] as directed to monitor her progress.  Obesity Terri [name] is currently in the action stage of change. As such, her goal is to continue with weight loss efforts She has agreed to follow the Category 2 plan Terri [name] has been instructed to work up to a goal of 150 minutes of combined cardio and strengthening exercise per week for weight loss and overall health benefits. We discussed the following Behavioral Modification Strategies today: keeping healthy foods in the home and work on meal planning and easy cooking plans  Terri [name] has agreed to follow up with our clinic in 3 weeks. She was informed of the  importance of frequent follow up visits to maximize her success with intensive lifestyle modifications for her multiple health conditions.  ALLERGIES: Allergies  Allergen Reactions   Sulfonamide Derivatives    Sulfa Antibiotics Rash    MEDICATIONS: Current Outpatient Medications on File Prior to Visit  Medication Sig Dispense Refill   albuterol (PROVENTIL HFA;VENTOLIN HFA) 108 (90  Base) MCG/ACT inhaler Inhale 1-2 puffs into the lungs every 6 (six) hours as needed. 1 Inhaler 0   amLODipine-olmesartan (AZOR) 10-40 MG tablet TAKE 1 TABLET BY MOUTH EVERY DAY 90 tablet 1   levocetirizine (XYZAL) 5 MG tablet Take 5 mg by mouth every evening.     omeprazole (PRILOSEC) 40 MG capsule TAKE 1 CAPSULE BY MOUTH EVERY DAY 90 capsule 0   oxybutynin (DITROPAN-XL) 10 MG 24 hr tablet Take 10 mg by mouth at bedtime.     No current facility-administered medications on file prior to visit.     PAST MEDICAL HISTORY: Past Medical History:  Diagnosis Date   Allergic rhinitis    Apnea, sleep 06/28/2012   Previously used CPAP   Asthma    Atypical chest pain 01/14/2017   Back pain    Decreased visual acuity 08/05/2016   Depression 06/19/2015   Environmental and seasonal allergies    GERD (gastroesophageal reflux disease)    Heartburn    Hip pain, right 06/28/2012   HTN (hypertension)    Hypokalemia 01/14/2017   Leg pain    Morbid obesity (HCC) 04/11/2008   Qualifier: Diagnosis of  By: Nena JordanYoo DO, D. Robert    Numbness in feet 05/06/2016   Obesity    Osteoarthritis 01/16/2010   Qualifier: Diagnosis of  By: Nena JordanYoo DO, D. Robert    Preventative health care 06/25/2015   Shortness of breath    Shortness of breath on exertion    Sleep apnea    Swelling of extremity    Tinnitus    Vertigo     PAST SURGICAL HISTORY: Past Surgical History:  Procedure Laterality Date   BIOPSY BREAST  1997   left    childbirth      SOCIAL HISTORY: Social History   Tobacco Use   Smoking status: Never Smoker   Smokeless tobacco: Never Used  Substance Use Topics   Alcohol use: Yes    Comment: social   Drug use: No    FAMILY HISTORY: Family History  Problem Relation Age of Onset   Stroke Mother    Liver disease Mother        hepatitis b   Hypertension Mother    Cancer Father        esophagus, prostate   Hypertension Father    Heart disease Father    Liver  disease Brother        hep c   Mental illness Brother        PTSD from [place]   Stroke Maternal Grandmother    Cancer Paternal Grandfather        prostate    ROS: Review of Systems  Constitutional: Positive for weight loss.  Gastrointestinal: Negative for nausea and vomiting.  Musculoskeletal:       Negative for muscle weakness  Endo/Heme/Allergies:       Negative for polyphagia    PHYSICAL EXAM: Blood pressure (!) 141/79, pulse 64, temperature 97.7 F (36.5 C), temperature source Oral, height 5\' 4"  (1.626 m), weight 250 lb (113.4 kg), SpO2 97 %. Body mass index is 42.91 kg/m. Physical Exam Vitals signs reviewed.  Constitutional:  Appearance: Normal appearance. She is well-developed. She is obese.  Cardiovascular:     Rate and Rhythm: Normal rate.  Pulmonary:     Effort: Pulmonary effort is normal.  Musculoskeletal: Normal range of motion.  Skin:    General: Skin is warm and dry.  Neurological:     Mental Status: She is alert and oriented to person, place, and time.  Psychiatric:        Mood and Affect: Mood normal.        Behavior: Behavior normal.     RECENT LABS AND TESTS: BMET    Component Value Date/Time   NA 145 07/13/2018 1705   NA 146 (H) 03/26/2018 1315   K 4.7 07/13/2018 1705   CL 108 07/13/2018 1705   CO2 30 07/13/2018 1705   GLUCOSE 95 07/13/2018 1705   BUN 14 07/13/2018 1705   BUN 10 03/26/2018 1315   CREATININE 0.93 07/13/2018 1705   CREATININE 0.85 12/09/2013 1453   CALCIUM 9.3 07/13/2018 1705   GFRNONAA 75 03/26/2018 1315   GFRAA 87 03/26/2018 1315   Lab Results  Component Value Date   HGBA1C 5.6 07/13/2018   HGBA1C 5.5 03/26/2018   HGBA1C 5.5 08/11/2017   HGBA1C 5.4 01/18/2009   HGBA1C 5.5 04/11/2008   Lab Results  Component Value Date   INSULIN 12.5 03/26/2018   INSULIN 21.8 08/11/2017   CBC    Component Value Date/Time   WBC 12.6 (H) 07/13/2018 1705   RBC 4.09 07/13/2018 1705   HGB 12.1 07/13/2018 1705   HGB  13.3 08/11/2017 1155   HCT 38.1 07/13/2018 1705   HCT 40.8 08/11/2017 1155   PLT 336.0 07/13/2018 1705   PLT 358 08/11/2017 1155   MCV 93.2 07/13/2018 1705   MCV 94 08/11/2017 1155   MCH 30.6 08/11/2017 1155   MCH 30.9 12/09/2013 1453   MCHC 31.8 07/13/2018 1705   RDW 16.0 (H) 07/13/2018 1705   RDW 14.8 08/11/2017 1155   LYMPHSABS 2.2 08/11/2017 1155   MONOABS 0.8 12/21/2010 1107   EOSABS 0.4 08/11/2017 1155   BASOSABS 0.0 08/11/2017 1155   Iron/TIBC/Ferritin/ %Sat No results found for: IRON, TIBC, FERRITIN, IRONPCTSAT Lipid Panel     Component Value Date/Time   CHOL 140 07/13/2018 1705   CHOL 150 08/11/2017 1155   TRIG 104.0 07/13/2018 1705   HDL 43.60 07/13/2018 1705   HDL 52 08/11/2017 1155   CHOLHDL 3 07/13/2018 1705   VLDL 20.8 07/13/2018 1705   LDLCALC 75 07/13/2018 1705   LDLCALC 84 08/11/2017 1155   Hepatic Function Panel     Component Value Date/Time   PROT 6.6 07/13/2018 1705   PROT 6.7 03/26/2018 1315   ALBUMIN 3.9 07/13/2018 1705   ALBUMIN 4.0 03/26/2018 1315   AST 14 07/13/2018 1705   ALT 13 07/13/2018 1705   ALKPHOS 77 07/13/2018 1705   BILITOT 0.6 07/13/2018 1705   BILITOT 0.6 03/26/2018 1315   BILIDIR 0.1 12/09/2013 1453   IBILI 0.4 12/09/2013 1453      Component Value Date/Time   TSH 1.96 07/13/2018 1705   TSH 2.050 08/11/2017 1155   TSH 2.18 01/10/2017 1404     Ref. Range 07/13/2018 17:05  VITD Latest Ref Range: 30.00 - 100.00 ng/mL 31.88    OBESITY BEHAVIORAL INTERVENTION VISIT  Today's visit was # 20   Starting weight: 261 lbs Starting date: 08/11/2017 Today's weight : 250 lbs Today's date: 12/03/2018 Total lbs lost to date: 11    12/03/2018  Height  5\' 4"  (1.626 m)  Weight 250 lb (113.4 kg)  BMI (Calculated) 42.89  BLOOD PRESSURE - SYSTOLIC 141  BLOOD PRESSURE - DIASTOLIC 79   Body Fat % 53.5 %  Total Body Water (lbs) 89.4 lbs    ASK: We discussed the diagnosis of obesity with Terri PacasPamela E Crance today and Terri [name] agreed to give  Terri [name] to discuss obesity behavioral modification therapy today.  ASSESS: Terri [name] has the diagnosis of obesity and her BMI today is 42.89  Terri [name] is in the action stage of change   ADVISE: Terri [name] was educated on the multiple health risks of obesity as well as the benefit of weight loss to improve her health. She was advised of the need for long term treatment and the importance of lifestyle modifications to improve her current health and to decrease her risk of future health problems.  AGREE: Multiple dietary modification options and treatment options were discussed and  Terri [name] agreed to follow the recommendations documented in the above note.  ARRANGE: Terri [name] was educated on the importance of frequent visits to treat obesity as outlined per CMS and USPSTF guidelines and agreed to schedule her next follow up appointment today.  Cristi LoronI, Joanne Murray, am acting as transcriptionist for Alois Clicheracey Aguilar, PA-C I, Alois Clicheracey Aguilar, PA-C have reviewed above note and agree with its content

## 2018-12-24 ENCOUNTER — Encounter (INDEPENDENT_AMBULATORY_CARE_PROVIDER_SITE_OTHER): Payer: Self-pay | Admitting: Physician Assistant

## 2018-12-24 ENCOUNTER — Ambulatory Visit (INDEPENDENT_AMBULATORY_CARE_PROVIDER_SITE_OTHER): Payer: BC Managed Care – PPO | Admitting: Physician Assistant

## 2018-12-24 ENCOUNTER — Other Ambulatory Visit: Payer: Self-pay

## 2018-12-24 ENCOUNTER — Other Ambulatory Visit (INDEPENDENT_AMBULATORY_CARE_PROVIDER_SITE_OTHER): Payer: Self-pay | Admitting: Physician Assistant

## 2018-12-24 VITALS — BP 110/69 | HR 67 | Temp 98.5°F | Ht 64.0 in | Wt 253.0 lb

## 2018-12-24 DIAGNOSIS — E88819 Insulin resistance, unspecified: Secondary | ICD-10-CM

## 2018-12-24 DIAGNOSIS — Z9189 Other specified personal risk factors, not elsewhere classified: Secondary | ICD-10-CM

## 2018-12-24 DIAGNOSIS — E559 Vitamin D deficiency, unspecified: Secondary | ICD-10-CM

## 2018-12-24 DIAGNOSIS — Z6841 Body Mass Index (BMI) 40.0 and over, adult: Secondary | ICD-10-CM

## 2018-12-24 DIAGNOSIS — E8881 Metabolic syndrome: Secondary | ICD-10-CM

## 2018-12-24 NOTE — Progress Notes (Signed)
Office: [phone]  /  Fax: [phone]   HPI:   Chief Complaint: OBESITY Terri [name] is here to discuss her progress with her obesity treatment plan. She is on the Category 2 plan and is following her eating plan approximately 20 % of the time. She states she is exercising 0 minutes 0 times per week. Terri [name] reports that she has been skipping meals and eating once daily, when she eats out. She bought groceries yesterday in preparation to restart the plan.  Her weight is 253 lb (114.8 kg) today and has gained 3 lbs since her last visit. She has lost 8 lbs since starting treatment with us.  Insulin Resistance Terri [name] has a diagnosis of insulin resistance based on her elevated fasting insulin level >5. Although Kalyse's blood glucose readings are still under good control, insulin resistance puts her at greater risk of metabolic syndrome and diabetes. She is not on any medications and denies polyphagia. She continues to work on diet and exercise to decrease risk of diabetes.  Vitamin D Deficiency Terri [name] has a diagnosis of vitamin D deficiency. She is currently taking prescription Vit D and denies nausea, vomiting or muscle weakness.  At risk for osteopenia and osteoporosis Terri [name] is at higher risk of osteopenia and osteoporosis due to vitamin D deficiency.   ASSESSMENT AND PLAN:  Insulin resistance - Plan: Hemoglobin A1c, Insulin, random, Comprehensive metabolic panel  Vitamin D deficiency - Plan: VITAMIN D 25 Hydroxy (Vit-D Deficiency, Fractures)  At risk for osteoporosis  Class 3 severe obesity with serious comorbidity and body mass index (BMI) of 40.0 to 44.9 in adult, unspecified obesity type (HCC)  PLAN:  Insulin Resistance Terri [name] will continue to work on weight loss, exercise, and decreasing simple carbohydrates in her diet to help decrease the risk of diabetes. We dicussed metformin including benefits and risks. She was informed that eating too many simple carbohydrates or too  many calories at one sitting increases the likelihood of GI side effects. Terri [name] declined metformin for now and prescription was not written today. We will check labs. Terri [name] agrees to follow up with our clinic in 2 weeks as directed to monitor her progress.  Vitamin D Deficiency Terri [name] was informed that low vitamin D levels contributes to fatigue and are associated with obesity, breast, and colon cancer. Terri [name] agrees to continue taking prescription Vit D 50,000 IU every week and will follow up for routine testing of vitamin D, at least 2-3 times per year. She was informed of the risk of over-replacement of vitamin D and agrees to not increase her dose unless she discusses this with us first. We will check labs. Terri [name] agrees to follow up with our clinic in 2 weeks.  At risk for osteopenia and osteoporosis Terri [name] was given extended (15 minutes) osteoporosis prevention counseling today. Terri [name] is at risk for osteopenia and osteoporsis due to her vitamin D deficiency. She was encouraged to take her vitamin D and follow her higher calcium diet and increase strengthening exercise to help strengthen her bones and decrease her risk of osteopenia and osteoporosis.  Obesity Terri [name] is currently in the action stage of change. As such, her goal is to continue with weight loss efforts She has agreed to follow the Category 2 plan Terri [name] has been instructed to work up to a goal of 150 minutes of combined cardio and strengthening exercise per week for weight loss and overall health benefits. We discussed the following Behavioral Modification Strategies today: work on meal planning and easy cooking plans and  no skipping meals   Morgaine has agreed to follow up with our clinic in 2 weeks. She was informed of the importance of frequent follow up visits to maximize her success with intensive lifestyle modifications for her multiple health conditions.  ALLERGIES: Allergies  Allergen Reactions  . Sulfonamide  Derivatives   . Sulfa Antibiotics Rash    MEDICATIONS: Current Outpatient Medications on File Prior to Visit  Medication Sig Dispense Refill  . albuterol (PROVENTIL HFA;VENTOLIN HFA) 108 (90 Base) MCG/ACT inhaler Inhale 1-2 puffs into the lungs every 6 (six) hours as needed. 1 Inhaler 0  . amLODipine-olmesartan (AZOR) 10-40 MG tablet TAKE 1 TABLET BY MOUTH EVERY DAY 90 tablet 1  . levocetirizine (XYZAL) 5 MG tablet Take 5 mg by mouth every evening.    Marland Kitchen omeprazole (PRILOSEC) 40 MG capsule TAKE 1 CAPSULE BY MOUTH EVERY DAY 90 capsule 0  . oxybutynin (DITROPAN-XL) 10 MG 24 hr tablet Take 10 mg by mouth at bedtime.    . Vitamin D, Ergocalciferol, (DRISDOL) 1.25 MG (50000 UT) CAPS capsule Take 1 capsule (50,000 Units total) by mouth every 7 (seven) days. 4 capsule 0   No current facility-administered medications on file prior to visit.     PAST MEDICAL HISTORY: Past Medical History:  Diagnosis Date  . Allergic rhinitis   . Apnea, sleep 06/28/2012   Previously used CPAP  . Asthma   . Atypical chest pain 01/14/2017  . Back pain   . Decreased visual acuity 08/05/2016  . Depression 06/19/2015  . Environmental and seasonal allergies   . GERD (gastroesophageal reflux disease)   . Heartburn   . Hip pain, right 06/28/2012  . HTN (hypertension)   . Hypokalemia 01/14/2017  . Leg pain   . Morbid obesity ([place]) 04/11/2008   Qualifier: Diagnosis of  By: Wynona Luna   . Numbness in feet 05/06/2016  . Obesity   . Osteoarthritis 01/16/2010   Qualifier: Diagnosis of  By: Wynona Luna   . Preventative health care 06/25/2015  . Shortness of breath   . Shortness of breath on exertion   . Sleep apnea   . Swelling of extremity   . Tinnitus   . Vertigo     PAST SURGICAL HISTORY: Past Surgical History:  Procedure Laterality Date  . BIOPSY BREAST  1997   left   . childbirth      SOCIAL HISTORY: Social History   Tobacco Use  . Smoking status: Never Smoker  . Smokeless tobacco: Never  Used  Substance Use Topics  . Alcohol use: Yes    Comment: social  . Drug use: No    FAMILY HISTORY: Family History  Problem Relation Age of Onset  . Stroke Mother   . Liver disease Mother        hepatitis b  . Hypertension Mother   . Cancer Father        esophagus, prostate  . Hypertension Father   . Heart disease Father   . Liver disease Brother        hep c  . Mental illness Brother        PTSD from [place]  . Stroke Maternal Grandmother   . Cancer Paternal Grandfather        prostate    ROS: Review of Systems  Constitutional: Negative for weight loss.  Gastrointestinal: Negative for nausea and vomiting.  Musculoskeletal:       Negative muscle weakness    PHYSICAL EXAM: Blood pressure 110/69,  pulse 67, temperature 98.5 F (36.9 C), temperature source Oral, height 5\' 4"  (1.626 m), weight 253 lb (114.8 kg), SpO2 95 %. Body mass index is 43.43 kg/m. Physical Exam Vitals signs reviewed.  Constitutional:      Appearance: Normal appearance. She is obese.  Cardiovascular:     Rate and Rhythm: Normal rate.     Pulses: Normal pulses.  Pulmonary:     Effort: Pulmonary effort is normal.     Breath sounds: Normal breath sounds.  Musculoskeletal: Normal range of motion.  Skin:    General: Skin is warm and dry.  Neurological:     Mental Status: She is alert and oriented to person, place, and time.  Psychiatric:        Mood and Affect: Mood normal.        Behavior: Behavior normal.     RECENT LABS AND TESTS: BMET    Component Value Date/Time   NA 145 07/13/2018 1705   NA 146 (H) 03/26/2018 1315   K 4.7 07/13/2018 1705   CL 108 07/13/2018 1705   CO2 30 07/13/2018 1705   GLUCOSE 95 07/13/2018 1705   BUN 14 07/13/2018 1705   BUN 10 03/26/2018 1315   CREATININE 0.93 07/13/2018 1705   CREATININE 0.85 12/09/2013 1453   CALCIUM 9.3 07/13/2018 1705   GFRNONAA 75 03/26/2018 1315   GFRAA 87 03/26/2018 1315   Lab Results  Component Value Date   HGBA1C 5.6  07/13/2018   HGBA1C 5.5 03/26/2018   HGBA1C 5.5 08/11/2017   HGBA1C 5.4 01/18/2009   HGBA1C 5.5 04/11/2008   Lab Results  Component Value Date   INSULIN 12.5 03/26/2018   INSULIN 21.8 08/11/2017   CBC    Component Value Date/Time   WBC 12.6 (H) 07/13/2018 1705   RBC 4.09 07/13/2018 1705   HGB 12.1 07/13/2018 1705   HGB 13.3 08/11/2017 1155   HCT 38.1 07/13/2018 1705   HCT 40.8 08/11/2017 1155   PLT 336.0 07/13/2018 1705   PLT 358 08/11/2017 1155   MCV 93.2 07/13/2018 1705   MCV 94 08/11/2017 1155   MCH 30.6 08/11/2017 1155   MCH 30.9 12/09/2013 1453   MCHC 31.8 07/13/2018 1705   RDW 16.0 (H) 07/13/2018 1705   RDW 14.8 08/11/2017 1155   LYMPHSABS 2.2 08/11/2017 1155   MONOABS 0.8 12/21/2010 1107   EOSABS 0.4 08/11/2017 1155   BASOSABS 0.0 08/11/2017 1155   Iron/TIBC/Ferritin/ %Sat No results found for: IRON, TIBC, FERRITIN, IRONPCTSAT Lipid Panel     Component Value Date/Time   CHOL 140 07/13/2018 1705   CHOL 150 08/11/2017 1155   TRIG 104.0 07/13/2018 1705   HDL 43.60 07/13/2018 1705   HDL 52 08/11/2017 1155   CHOLHDL 3 07/13/2018 1705   VLDL 20.8 07/13/2018 1705   LDLCALC 75 07/13/2018 1705   LDLCALC 84 08/11/2017 1155   Hepatic Function Panel     Component Value Date/Time   PROT 6.6 07/13/2018 1705   PROT 6.7 03/26/2018 1315   ALBUMIN 3.9 07/13/2018 1705   ALBUMIN 4.0 03/26/2018 1315   AST 14 07/13/2018 1705   ALT 13 07/13/2018 1705   ALKPHOS 77 07/13/2018 1705   BILITOT 0.6 07/13/2018 1705   BILITOT 0.6 03/26/2018 1315   BILIDIR 0.1 12/09/2013 1453   IBILI 0.4 12/09/2013 1453      Component Value Date/Time   TSH 1.96 07/13/2018 1705   TSH 2.050 08/11/2017 1155   TSH 2.18 01/10/2017 1404      OBESITY  BEHAVIORAL INTERVENTION VISIT  Today's visit was # 21   Starting weight: 261 lbs Starting date: 08/11/17 Today's weight : 253 lbs  Today's date: 12/24/2018 Total lbs lost to date: 8    ASK: We discussed the diagnosis of obesity with  Sharlyne PacasPamela E Damas today and Terri [name] agreed to give us permission to discuss obesity behavioral modification therapy today.  ASSESS: Terri [name] has the diagnosis of obesity and her BMI today is 43.41 Terri [name] is in the action stage of change   ADVISE: Terri [name] was educated on the multiple health risks of obesity as well as the benefit of weight loss to improve her health. She was advised of the need for long term treatment and the importance of lifestyle modifications to improve her current health and to decrease her risk of future health problems.  AGREE: Multiple dietary modification options and treatment options were discussed and  Terri [name] agreed to follow the recommendations documented in the above note.  ARRANGE: Terri [name] was educated on the importance of frequent visits to treat obesity as outlined per CMS and USPSTF guidelines and agreed to schedule her next follow up appointment today.  Trude McburneyI, Ernie Kasler, am acting as transcriptionist for Alois Clicheracey Aguilar, PA-C I, Alois Clicheracey Aguilar, PA-C have reviewed above note and agree with its content

## 2018-12-25 LAB — COMPREHENSIVE METABOLIC PANEL
ALT: 9 IU/L (ref 0–32)
AST: 12 IU/L (ref 0–40)
Albumin/Globulin Ratio: 1.8 (ref 1.2–2.2)
Albumin: 4.2 g/dL (ref 3.8–4.8)
Alkaline Phosphatase: 79 IU/L (ref 39–117)
BUN/Creatinine Ratio: 16 (ref 12–28)
BUN: 14 mg/dL (ref 8–27)
Bilirubin Total: 0.6 mg/dL (ref 0.0–1.2)
CO2: 24 mmol/L (ref 20–29)
Calcium: 9.3 mg/dL (ref 8.7–10.3)
Chloride: 107 mmol/L — ABNORMAL HIGH (ref 96–106)
Creatinine, Ser: 0.88 mg/dL (ref 0.57–1.00)
GFR calc Af Amer: 79 mL/min/{1.73_m2} (ref >59)
GFR calc non Af Amer: 69 mL/min/{1.73_m2} (ref >59)
Globulin, Total: 2.4 g/dL (ref 1.5–4.5)
Glucose: 101 mg/dL — ABNORMAL HIGH (ref 65–99)
Potassium: 4 mmol/L (ref 3.5–5.2)
Sodium: 146 mmol/L — ABNORMAL HIGH (ref 134–144)
Total Protein: 6.6 g/dL (ref 6.0–8.5)

## 2018-12-25 LAB — VITAMIN D 25 HYDROXY (VIT D DEFICIENCY, FRACTURES): Vit D, 25-Hydroxy: 41.2 ng/mL (ref 30.0–100.0)

## 2018-12-25 LAB — HEMOGLOBIN A1C
Est. average glucose Bld gHb Est-mCnc: 105 mg/dL
Hgb A1c MFr Bld: 5.3 % (ref 4.8–5.6)

## 2018-12-25 LAB — INSULIN, RANDOM: INSULIN: 23 u[IU]/mL (ref 2.6–24.9)

## 2019-01-07 ENCOUNTER — Encounter: Payer: Self-pay | Admitting: Family Medicine

## 2019-01-07 ENCOUNTER — Other Ambulatory Visit: Payer: Self-pay

## 2019-01-07 ENCOUNTER — Ambulatory Visit: Payer: BC Managed Care – PPO | Admitting: Family Medicine

## 2019-01-07 VITALS — BP 138/80 | HR 65 | Temp 98.2°F | Resp 18 | Wt 259.8 lb

## 2019-01-07 DIAGNOSIS — E88819 Insulin resistance, unspecified: Secondary | ICD-10-CM

## 2019-01-07 DIAGNOSIS — E559 Vitamin D deficiency, unspecified: Secondary | ICD-10-CM | POA: Diagnosis not present

## 2019-01-07 DIAGNOSIS — J029 Acute pharyngitis, unspecified: Secondary | ICD-10-CM

## 2019-01-07 DIAGNOSIS — R739 Hyperglycemia, unspecified: Secondary | ICD-10-CM

## 2019-01-07 DIAGNOSIS — R059 Cough, unspecified: Secondary | ICD-10-CM

## 2019-01-07 DIAGNOSIS — I10 Essential (primary) hypertension: Secondary | ICD-10-CM | POA: Diagnosis not present

## 2019-01-07 DIAGNOSIS — R05 Cough: Secondary | ICD-10-CM

## 2019-01-07 DIAGNOSIS — E782 Mixed hyperlipidemia: Secondary | ICD-10-CM

## 2019-01-07 DIAGNOSIS — K219 Gastro-esophageal reflux disease without esophagitis: Secondary | ICD-10-CM

## 2019-01-07 DIAGNOSIS — E8881 Metabolic syndrome: Secondary | ICD-10-CM | POA: Diagnosis not present

## 2019-01-07 LAB — CBC
HCT: 40.2 % (ref 36.0–46.0)
Hemoglobin: 13.3 g/dL (ref 12.0–15.0)
MCHC: 33.1 g/dL (ref 30.0–36.0)
MCV: 94.8 fl (ref 78.0–100.0)
Platelets: 354 10*3/uL (ref 150.0–400.0)
RBC: 4.24 Mil/uL (ref 3.87–5.11)
RDW: 14.5 % (ref 11.5–15.5)
WBC: 8.1 10*3/uL (ref 4.0–10.5)

## 2019-01-07 LAB — LIPID PANEL
Cholesterol: 152 mg/dL (ref 0–200)
HDL: 46.8 mg/dL (ref >39.00)
LDL Cholesterol: 87 mg/dL (ref 0–99)
NonHDL: 105.59
Total CHOL/HDL Ratio: 3
Triglycerides: 95 mg/dL (ref 0.0–149.0)
VLDL: 19 mg/dL (ref 0.0–40.0)

## 2019-01-07 LAB — TSH: TSH: 2.43 u[IU]/mL (ref 0.35–4.50)

## 2019-01-07 MED ORDER — FAMOTIDINE 40 MG PO TABS: 40.0000 mg | ORAL_TABLET | Freq: Every day | ORAL | 1 refills | Status: DC

## 2019-01-07 NOTE — Patient Instructions (Signed)
Carbohydrate Counting for hyperglycemia  [employer] Blood pressure cuff upper arm  Pulse oximeter    Check vitals weekly  Carbohydrate counting is a method of keeping track of how many carbohydrates you eat. Eating carbohydrates naturally increases the amount of sugar (glucose) in the blood. Counting how many carbohydrates you eat helps keep your blood glucose within normal limits, which helps you manage your diabetes (diabetes mellitus). It is important to know how many carbohydrates you can safely have in each meal. This is different for every person. A diet and nutrition specialist (registered dietitian) can help you make a meal plan and calculate how many carbohydrates you should have at each meal and snack. Carbohydrates are found in the following foods:  Grains, such as breads and cereals.  Dried beans and soy products.  Starchy vegetables, such as potatoes, peas, and corn.  Fruit and fruit juices.  Milk and yogurt.  Sweets and snack foods, such as cake, cookies, candy, chips, and soft drinks. How do I count carbohydrates? There are two ways to count carbohydrates in food. You can use either of the methods or a combination of both. Reading "Nutrition Facts" on packaged food The "Nutrition Facts" list is included on the labels of almost all packaged foods and beverages in the [place] It includes:  The serving size.  Information about nutrients in each serving, including the grams (g) of carbohydrate per serving. To use the "Nutrition Facts":  Decide how many servings you will have.  Multiply the number of servings by the number of carbohydrates per serving.  The resulting number is the total amount of carbohydrates that you will be having. Learning standard serving sizes of other foods When you eat carbohydrate foods that are not packaged or do not include "Nutrition Facts" on the label, you need to measure the servings in order to count the amount of carbohydrates:  Measure  the foods that you will eat with a food scale or measuring cup, if needed.  Decide how many standard-size servings you will eat.  Multiply the number of servings by 15. Most carbohydrate-rich foods have about 15 g of carbohydrates per serving. ? For example, if you eat 8 oz (170 g) of strawberries, you will have eaten 2 servings and 30 g of carbohydrates (2 servings x 15 g = 30 g).  For foods that have more than one food mixed, such as soups and casseroles, you must count the carbohydrates in each food that is included. The following list contains standard serving sizes of common carbohydrate-rich foods. Each of these servings has about 15 g of carbohydrates:   hamburger bun or  English muffin.   oz (15 mL) syrup.   oz (14 g) jelly.  1 slice of bread.  1 six-inch tortilla.  3 oz (85 g) cooked rice or pasta.  4 oz (113 g) cooked dried beans.  4 oz (113 g) starchy vegetable, such as peas, corn, or potatoes.  4 oz (113 g) hot cereal.  4 oz (113 g) mashed potatoes or  of a large baked potato.  4 oz (113 g) canned or frozen fruit.  4 oz (120 mL) fruit juice.  4-6 crackers.  6 chicken nuggets.  6 oz (170 g) unsweetened dry cereal.  6 oz (170 g) plain fat-free yogurt or yogurt sweetened with artificial sweeteners.  8 oz (240 mL) milk.  8 oz (170 g) fresh fruit or one small piece of fruit.  24 oz (680 g) popped popcorn. Example of carbohydrate counting  Sample meal  3 oz (85 g) chicken breast.  6 oz (170 g) brown rice.  4 oz (113 g) corn.  8 oz (240 mL) milk.  8 oz (170 g) strawberries with sugar-free whipped topping. Carbohydrate calculation 1. Identify the foods that contain carbohydrates: ? Rice. ? Corn. ? Milk. ? Strawberries. 2. Calculate how many servings you have of each food: ? 2 servings rice. ? 1 serving corn. ? 1 serving milk. ? 1 serving strawberries. 3. Multiply each number of servings by 15 g: ? 2 servings rice x 15 g = 30 g. ? 1  serving corn x 15 g = 15 g. ? 1 serving milk x 15 g = 15 g. ? 1 serving strawberries x 15 g = 15 g. 4. Add together all of the amounts to find the total grams of carbohydrates eaten: ? 30 g + 15 g + 15 g + 15 g = 75 g of carbohydrates total. Summary  Carbohydrate counting is a method of keeping track of how many carbohydrates you eat.  Eating carbohydrates naturally increases the amount of sugar (glucose) in the blood.  Counting how many carbohydrates you eat helps keep your blood glucose within normal limits, which helps you manage your diabetes.  A diet and nutrition specialist (registered dietitian) can help you make a meal plan and calculate how many carbohydrates you should have at each meal and snack. This information is not intended to replace advice given to you by your health care provider. Make sure you discuss any questions you have with your health care provider. Document Released: 05/20/2005 Document Revised: 12/12/2016 Document Reviewed: 11/01/2015 Elsevier Patient Education  2020 [employer]

## 2019-01-10 NOTE — Assessment & Plan Note (Signed)
Supplement and monitor 

## 2019-01-10 NOTE — Assessment & Plan Note (Signed)
Avoid offending foods, start probiotics. Do not eat large meals in late evening and consider raising head of bed. Protonix and Pepcid. Referred to Gastroenterology for further consideration

## 2019-01-10 NOTE — Assessment & Plan Note (Signed)
Encouraged heart healthy diet, increase exercise, avoid trans fats, consider a krill oil cap daily 

## 2019-01-10 NOTE — Assessment & Plan Note (Signed)
Check weekly vitals, no changes to meds. Encouraged heart healthy diet such as the DASH diet and exercise as tolerated.

## 2019-01-10 NOTE — Assessment & Plan Note (Signed)
Persistent. Pepcid and Zyrtec bid. Likely related to reflux

## 2019-01-10 NOTE — Assessment & Plan Note (Addendum)
hgba1c acceptable, minimize simple carbs. Increase exercise as tolerated. 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 She will not return to work for now.

## 2019-01-10 NOTE — Progress Notes (Signed)
Subjective:    [name], female    DOB: 21-Jul-1950, 68 y.o.   MRN: [id number]  No chief complaint on file.   HPI Patient is in today for follow up on chronic medical concerns including reflux, hypertension, hyperglycemia and more. She has been quarantining well since being sent home from school in March. No recent febrile illness or hospitalizations. No c/o polyuria or polydipsia. Denies CP/palp/SOB/HA/congestion/fevers or GU c/o. Taking meds as prescribed. Is noting worsening heartburn, sore throat and cough worse in am upon first arising.   Past Medical History:  Diagnosis Date  . Allergic rhinitis   . Apnea, sleep 06/28/2012   Previously used CPAP  . Asthma   . Atypical chest pain 01/14/2017  . Back pain   . Decreased visual acuity 08/05/2016  . Depression 06/19/2015  . Environmental and seasonal allergies   . GERD (gastroesophageal reflux disease)   . Heartburn   . Hip pain, right 06/28/2012  . HTN (hypertension)   . Hypokalemia 01/14/2017  . Leg pain   . Morbid obesity (HCC) 04/11/2008   Qualifier: Diagnosis of  By: Nena JordanYoo DO, D. Robert   . Numbness in feet 05/06/2016  . Obesity   . Osteoarthritis 01/16/2010   Qualifier: Diagnosis of  By: Nena JordanYoo DO, D. Robert   . Preventative health care 06/25/2015  . Shortness of breath   . Shortness of breath on exertion   . Sleep apnea   . Swelling of extremity   . Tinnitus   . Vertigo     Past Surgical History:  Procedure Laterality Date  . BIOPSY BREAST  1997   left   . childbirth      Family History  Problem Relation Age of Onset  . Stroke Mother   . Liver disease Mother        hepatitis b  . Hypertension Mother   . Cancer Father        esophagus, prostate  . Hypertension Father   . Heart disease Father   . Liver disease Brother        hep c  . Mental illness Brother        PTSD from [place]  . Stroke Maternal Grandmother   . Cancer Paternal Grandfather        prostate    Social History    Socioeconomic History  . Marital status: Divorced    Spouse name: Not on file  . Number of children: 1  . Years of education: Not on file  . Highest education level: Not on file  Occupational History  . Occupation: HS music teacher- [employer]  Social Needs  . Financial resource strain: Not on file  . Food insecurity    Worry: Not on file    Inability: Not on file  . Transportation needs    Medical: Not on file    Non-medical: Not on file  Tobacco Use  . Smoking status: Never Smoker  . Smokeless tobacco: Never Used  Substance and Sexual Activity  . Alcohol use: Yes    Comment: social  . Drug use: No  . Sexual activity: Never    Comment: band teacher, 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 no dietary restrictions  Lifestyle  . Physical activity    Days per week: Not on file    Minutes per session: Not on file  . Stress: Not on file  Relationships  . Social connections    Talks on phone: Not on file  Gets together: Not on file    Attends religious service: Not on file    Active member of club or organization: Not on file    Attends meetings of clubs or organizations: Not on file    Relationship status: Not on file  . Intimate partner violence    Fear of current or ex partner: Not on file    Emotionally abused: Not on file    Physically abused: Not on file    Forced sexual activity: Not on file  Other Topics Concern  . Not on file  Social History Narrative  . Not on file    Outpatient Medications Prior to Visit  Medication Sig Dispense Refill  . albuterol (PROVENTIL HFA;VENTOLIN HFA) 108 (90 Base) MCG/ACT inhaler Inhale 1-2 puffs into the lungs every 6 (six) hours as needed. 1 Inhaler 0  . amLODipine-olmesartan (AZOR) 10-40 MG tablet TAKE 1 TABLET BY MOUTH EVERY DAY 90 tablet 1  . levocetirizine (XYZAL) 5 MG tablet Take 5 mg by mouth every evening.    Marland Kitchen omeprazole (PRILOSEC) 40 MG capsule TAKE 1 CAPSULE BY MOUTH EVERY DAY 90 capsule 0  . oxybutynin (DITROPAN-XL) 10 MG 24  hr tablet Take 10 mg by mouth at bedtime.    . Vitamin D, Ergocalciferol, (DRISDOL) 1.25 MG (50000 UT) CAPS capsule Take 1 capsule (50,000 Units total) by mouth every 7 (seven) days. 4 capsule 0   No facility-administered medications prior to visit.     Allergies  Allergen Reactions  . Sulfonamide Derivatives   . Sulfa Antibiotics Rash    Review of Systems  Constitutional: Negative for fever and malaise/fatigue.  HENT: Positive for sore throat. Negative for congestion.   Eyes: Negative for blurred vision.  Respiratory: Positive for cough. Negative for shortness of breath.   Cardiovascular: Negative for chest pain, palpitations and leg swelling.  Gastrointestinal: Positive for heartburn. Negative for abdominal pain, blood in stool and nausea.  Genitourinary: Negative for dysuria and frequency.  Musculoskeletal: Negative for falls.  Skin: Negative for rash.  Neurological: Negative for dizziness, loss of consciousness and headaches.  Endo/Heme/Allergies: Negative for environmental allergies.  Psychiatric/Behavioral: Negative for depression. The patient is not nervous/anxious.        Objective:    Physical Exam Vitals signs and nursing note reviewed.  Constitutional:      General: She is not in acute distress.    Appearance: She is well-developed.  HENT:     Head: Normocephalic and atraumatic.     Nose: Nose normal.  Eyes:     General:        Right eye: No discharge.        Left eye: No discharge.  Neck:     Musculoskeletal: Normal range of motion and neck supple.  Cardiovascular:     Rate and Rhythm: Normal rate and regular rhythm.     Heart sounds: No murmur.  Pulmonary:     Effort: Pulmonary effort is normal.     Breath sounds: Normal breath sounds.  Abdominal:     General: Bowel sounds are normal.     Palpations: Abdomen is soft.     Tenderness: There is no abdominal tenderness.  Skin:    General: Skin is warm and dry.  Neurological:     Mental Status: She is  alert and oriented to person, place, and time.     BP 138/80 (BP Location: Left Arm, Patient Position: Sitting, Cuff Size: Normal)   Pulse 65   Temp 98.2 F (  36.8 C) (Oral)   Resp 18   Wt 259 lb 12.8 oz (117.8 kg)   SpO2 99%   BMI 44.59 kg/m  Wt Readings from Last 3 Encounters:  01/07/19 259 lb 12.8 oz (117.8 kg)  12/24/18 253 lb (114.8 kg)  12/03/18 250 lb (113.4 kg)    Diabetic Foot Exam - Simple   No data filed     Lab Results  Component Value Date   WBC 8.1 01/07/2019   HGB 13.3 01/07/2019   HCT 40.2 01/07/2019   PLT 354.0 01/07/2019   GLUCOSE 101 (H) 12/24/2018   CHOL 152 01/07/2019   TRIG 95.0 01/07/2019   HDL 46.80 01/07/2019   LDLCALC 87 01/07/2019   ALT 9 12/24/2018   AST 12 12/24/2018   NA 146 (H) 12/24/2018   K 4.0 12/24/2018   CL 107 (H) 12/24/2018   CREATININE 0.88 12/24/2018   BUN 14 12/24/2018   CO2 24 12/24/2018   TSH 2.43 01/07/2019   HGBA1C 5.3 12/24/2018    Lab Results  Component Value Date   TSH 2.43 01/07/2019   Lab Results  Component Value Date   WBC 8.1 01/07/2019   HGB 13.3 01/07/2019   HCT 40.2 01/07/2019   MCV 94.8 01/07/2019   PLT 354.0 01/07/2019   Lab Results  Component Value Date   NA 146 (H) 12/24/2018   K 4.0 12/24/2018   CO2 24 12/24/2018   GLUCOSE 101 (H) 12/24/2018   BUN 14 12/24/2018   CREATININE 0.88 12/24/2018   BILITOT 0.6 12/24/2018   ALKPHOS 79 12/24/2018   AST 12 12/24/2018   ALT 9 12/24/2018   PROT 6.6 12/24/2018   ALBUMIN 4.2 12/24/2018   CALCIUM 9.3 12/24/2018   GFR 73.04 07/13/2018   Lab Results  Component Value Date   CHOL 152 01/07/2019   Lab Results  Component Value Date   HDL 46.80 01/07/2019   Lab Results  Component Value Date   LDLCALC 87 01/07/2019   Lab Results  Component Value Date   TRIG 95.0 01/07/2019   Lab Results  Component Value Date   CHOLHDL 3 01/07/2019   Lab Results  Component Value Date   HGBA1C 5.3 12/24/2018       Assessment & Plan:   Problem List  Items Addressed This Visit    Hyperlipidemia, mixed    Encouraged heart healthy diet, increase exercise, avoid trans fats, consider a krill oil cap daily      Relevant Orders   Lipid panel (Completed)   Essential hypertension - Primary    Check weekly vitals, no changes to meds. Encouraged heart healthy diet such as the DASH diet and exercise as tolerated.       Relevant Orders   CBC (Completed)   TSH (Completed)   GERD    Avoid offending foods, start probiotics. Do not eat large meals in late evening and consider raising head of bed. Protonix and Pepcid. Referred to Gastroenterology for further consideration      Relevant Medications   famotidine (PEPCID) 40 MG tablet   Other Relevant Orders   Ambulatory referral to Gastroenterology   Vitamin D deficiency    Supplement and monitor      Insulin resistance   Hyperglycemia    hgba1c acceptable, minimize simple carbs. Increase exercise as tolerated. Patient is a band Interior and spatial designerdirector and at high risk for complications from COVID were she to get it. She will not return to work for now.  Cough    Persistent. Pepcid and Zyrtec bid. Likely related to reflux      Relevant Orders   Ambulatory referral to Gastroenterology    Other Visit Diagnoses    Sore throat       Relevant Orders   Ambulatory referral to Gastroenterology      I am having Terri [name] start on famotidine. I am also having Terri [name] maintain Terri [name] oxybutynin, levocetirizine, albuterol, omeprazole, amLODipine-olmesartan, and Vitamin D (Ergocalciferol).  Meds ordered this encounter  Medications  . famotidine (PEPCID) 40 MG tablet    Sig: Take 1 tablet (40 mg total) by mouth daily.    Dispense:  90 tablet    Refill:  1     Danise EdgeStacey Puanani Gene, MD

## 2019-01-14 ENCOUNTER — Ambulatory Visit: Payer: BC Managed Care – PPO | Admitting: Family Medicine

## 2019-02-08 ENCOUNTER — Other Ambulatory Visit (INDEPENDENT_AMBULATORY_CARE_PROVIDER_SITE_OTHER): Payer: Self-pay | Admitting: Physician Assistant

## 2019-02-08 DIAGNOSIS — E559 Vitamin D deficiency, unspecified: Secondary | ICD-10-CM

## 2019-02-16 ENCOUNTER — Other Ambulatory Visit: Payer: Self-pay

## 2019-02-16 ENCOUNTER — Encounter: Payer: Self-pay | Admitting: Internal Medicine

## 2019-02-16 ENCOUNTER — Ambulatory Visit: Payer: BC Managed Care – PPO | Admitting: Internal Medicine

## 2019-02-16 VITALS — BP 110/70 | HR 72 | Temp 97.9°F | Ht 64.0 in | Wt 258.1 lb

## 2019-02-16 DIAGNOSIS — Z1211 Encounter for screening for malignant neoplasm of colon: Secondary | ICD-10-CM | POA: Diagnosis not present

## 2019-02-16 DIAGNOSIS — K219 Gastro-esophageal reflux disease without esophagitis: Secondary | ICD-10-CM

## 2019-02-16 DIAGNOSIS — R053 Chronic cough: Secondary | ICD-10-CM

## 2019-02-16 DIAGNOSIS — R05 Cough: Secondary | ICD-10-CM
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
## 2019-02-16 NOTE — Patient Instructions (Signed)
You have been scheduled for an endoscopy and colonoscopy. Please follow the written instructions given to you at your visit today. Please pick up your prep supplies at the pharmacy within the next 1-3 days. If you use inhalers (even only as needed), please bring them with you on the day of your procedure.  I appreciate the opportunity to care for you. Carl Gessner, MD, FACG 

## 2019-02-19 ENCOUNTER — Encounter: Payer: Self-pay | Admitting: Internal Medicine

## 2019-02-26 ENCOUNTER — Telehealth: Payer: Self-pay | Admitting: Internal Medicine

## 2019-02-26 NOTE — Telephone Encounter (Signed)
Left msg to call back for Covid questions:  Do you now or have you had a fever in the last 14 days?        Do you have any respiratory symptoms of shortness of breath or cough now or in the last 14 days?       Do you have any family members or close contacts with diagnosed or suspected Covid-19 in the past 14 days?     Have you been tested for Covid-19 and found to be positive?      Made pt aware that care partner may come to the lobby during the procedure but will need to provide their own mask.

## 2019-02-26 NOTE — Telephone Encounter (Signed)
Pt responded "no" to all screening questions °

## 2019-02-27 ENCOUNTER — Other Ambulatory Visit: Payer: Self-pay | Admitting: Family Medicine

## 2019-03-01 ENCOUNTER — Ambulatory Visit (AMBULATORY_SURGERY_CENTER): Payer: BC Managed Care – PPO | Admitting: Internal Medicine

## 2019-03-01 ENCOUNTER — Other Ambulatory Visit: Payer: Self-pay

## 2019-03-01 ENCOUNTER — Encounter: Payer: Self-pay | Admitting: Internal Medicine

## 2019-03-01 VITALS — BP 118/54 | HR 66 | Temp 97.7°F | Resp 12 | Ht 64.0 in | Wt 258.0 lb

## 2019-03-01 DIAGNOSIS — R053 Chronic cough: Secondary | ICD-10-CM

## 2019-03-01 DIAGNOSIS — R05 Cough: Secondary | ICD-10-CM | POA: Diagnosis not present

## 2019-03-01 DIAGNOSIS — K449 Diaphragmatic hernia without obstruction or gangrene: Secondary | ICD-10-CM | POA: Diagnosis not present

## 2019-03-01 DIAGNOSIS — Z1211 Encounter for screening for malignant neoplasm of colon: Secondary | ICD-10-CM | POA: Diagnosis not present

## 2019-03-01 DIAGNOSIS — K219 Gastro-esophageal reflux disease without esophagitis: Secondary | ICD-10-CM | POA: Diagnosis not present

## 2019-03-01 HISTORY — PX: UPPER GASTROINTESTINAL ENDOSCOPY: SHX188

## 2019-03-01 LAB — HM COLONOSCOPY

## 2019-03-01 MED ORDER — SODIUM CHLORIDE 0.9 % IV SOLN: 500.0000 mL | Freq: Once | INTRAVENOUS | Status: DC

## 2019-03-01 MED ORDER — DEXILANT 30 MG PO CPDR: 30.0000 mg | DELAYED_RELEASE_CAPSULE | Freq: Every day | ORAL | 0 refills | Status: DC

## 2019-03-01 MED ORDER — DEXILANT 60 MG PO CPDR: 60.0000 mg | DELAYED_RELEASE_CAPSULE | Freq: Every day | ORAL | 0 refills | Status: DC

## 2019-03-01 NOTE — Op Note (Signed)
[facility] Patient Name: Terri [name] Procedure Date: 03/01/2019 3:15 PM MRN: [id number] Endoscopist: Iva Boop , MD Age: 68 Referring MD:  Date of Birth: 1951-02-08 Gender: Female Account #: [id number] Procedure:                Upper GI endoscopy Indications:              Heartburn, Chronic cough Medicines:                Propofol per Anesthesia, Monitored Anesthesia Care Procedure:                Pre-Anesthesia Assessment:                           - Prior to the procedure, a History and Physical                            was performed, and patient medications and                            allergies were reviewed. The patient's tolerance of                            previous anesthesia was also reviewed. The risks                            and benefits of the procedure and the sedation                            options and risks were discussed with the patient.                            All questions were answered, and informed consent                            was obtained. Prior Anticoagulants: The patient has                            taken no previous anticoagulant or antiplatelet                            agents. ASA Grade Assessment: III - A patient with                            severe systemic disease. After reviewing the risks                            and benefits, the patient was deemed in                            satisfactory condition to undergo the procedure.                           After obtaining informed consent, the endoscope was  passed under direct vision. Throughout the                            procedure, the patient's blood pressure, pulse, and                            oxygen saturations were monitored continuously. The                            Endoscope was introduced through the mouth, and                            advanced to the second part of duodenum. The upper                            GI  endoscopy was accomplished without difficulty.                            The patient tolerated the procedure well. Scope In: Scope Out: Findings:                 A single small submucosal papule (nodule) was found                            on the greater curvature of the gastric antrum.                           A 2 cm hiatal hernia was present.                           The gastroesophageal flap valve was visualized                            endoscopically and classified as Hill Grade IV (no                            fold, wide open lumen, hiatal hernia present).                           The exam was otherwise without abnormality.                           The cardia and gastric fundus were normal on                            retroflexion. Complications:            No immediate complications. Estimated Blood Loss:     Estimated blood loss: none. Impression:               - A single submucosal papule (nodule) found in the                            stomach.                           -  2 cm hiatal hernia.                           - Gastroesophageal flap valve classified as Hill                            Grade IV (no fold, wide open lumen, hiatal hernia                            present).                           - The examination was otherwise normal.                           - No specimens collected. Recommendation:           - Patient has a contact number available for                            emergencies. The signs and symptoms of potential                            delayed complications were discussed with the                            patient. Return to normal activities tomorrow.                            Written discharge instructions were provided to the                            patient.                           - Change PPI to Dexilant 60- mg                           Work on weight loss - try low carb diet                           Discuss possible EUS of  nodule at f/u - this lesion                            is very small and very likely inconsequential                           See me in 2 mos [name], MD 03/01/2019 4:12:18 PM This report has been signed electronically.

## 2019-03-01 NOTE — Op Note (Signed)
[facility] Patient Name: Terri [name] Procedure Date: 03/01/2019 3:15 PM MRN: [id number] Endoscopist: Iva Booparl E [place] Dube , MD Age: 68 Referring MD:  Date of Birth: 07/29/1950 Gender: Female Account #: [id number] Procedure:                Colonoscopy Indications:              Screening for colorectal malignant neoplasm Medicines:                Propofol per Anesthesia, Monitored Anesthesia Care Procedure:                Pre-Anesthesia Assessment:                           - Prior to the procedure, a History and Physical                            was performed, and patient medications and                            allergies were reviewed. The patient's tolerance of                            previous anesthesia was also reviewed. The risks                            and benefits of the procedure and the sedation                            options and risks were discussed with the patient.                            All questions were answered, and informed consent                            was obtained. Prior Anticoagulants: The patient has                            taken no previous anticoagulant or antiplatelet                            agents. ASA Grade Assessment: III - A patient with                            severe systemic disease. After reviewing the risks                            and benefits, the patient was deemed in                            satisfactory condition to undergo the procedure.                           After obtaining informed consent, the colonoscope  was passed under direct vision. Throughout the                            procedure, the patient's blood pressure, pulse, and                            oxygen saturations were monitored continuously. The                            Colonoscope was introduced through the anus and                            advanced to the the cecum, identified by                             appendiceal orifice and ileocecal valve. The                            colonoscopy was performed with moderate difficulty                            due to significant looping. Successful completion                            of the procedure was aided by using manual                            pressure. The patient tolerated the procedure well.                            The quality of the bowel preparation was excellent.                            The bowel preparation used was Miralax via split                            dose instruction. The ileocecal valve, appendiceal                            orifice, and rectum were photographed. Scope In: 3:37:54 PM Scope Out: 3:53:29 PM Scope Withdrawal Time: 0 hours 4 minutes 32 seconds  Total Procedure Duration: 0 hours 15 minutes 35 seconds  Findings:                 The entire examined colon appeared normal on direct                            and retroflexion views. Complications:            No immediate complications. Estimated Blood Loss:     Estimated blood loss: none. Impression:               - The entire examined colon is normal on direct and  retroflexion views.                           - No specimens collected. Recommendation:           - Patient has a contact number available for                            emergencies. The signs and symptoms of potential                            delayed complications were discussed with the                            patient. Return to normal activities tomorrow.                            Written discharge instructions were provided to the                            patient.                           - Low Carb.                           - No repeat colonoscopy due to current age (3                            years or older) and the absence of colonic polyps. [name], MD 03/01/2019 4:14:34 PM This report has been signed electronically.

## 2019-03-01 NOTE — Progress Notes (Signed)
Vitals CW Temp KA

## 2019-03-01 NOTE — Patient Instructions (Addendum)
I found a small hiatal hernia and a small nodule in the stomach. Nothing bad going on.  The colonoscopy was normal.  I think your excess weight is a major part of the problem.  I have stopped omeprazole and prescribed Dexilant to treat the reflux.  In addition please look at a website called DietDoctor.com and look at low carb diets, drberry.com and look at the proper human diet.  You should give this a try.  Another helpful site is bulletproofdiet.com   I bet if you choose one or some version of these diets you will lose weight and feel better.  Go ahead and make a follow-up visit to see me in late November (call now please).  I appreciate the opportunity to care for you. Iva Boop, MD, FACG      YOU HAD AN ENDOSCOPIC PROCEDURE TODAY AT THE [facility]:   Refer to the procedure report that was given to you for any specific questions about what was found during the examination.  If the procedure report does not answer your questions, please call your gastroenterologist to clarify.  If you requested that your care partner not be given the details of your procedure findings, then the procedure report has been included in a sealed envelope for you to review at your convenience later.  YOU SHOULD EXPECT: Some feelings of bloating in the abdomen. Passage of more gas than usual.  Walking can help get rid of the air that was put into your GI tract during the procedure and reduce the bloating. If you had a lower endoscopy (such as a colonoscopy or flexible sigmoidoscopy) you may notice spotting of blood in your stool or on the toilet paper. If you underwent a bowel prep for your procedure, you may not have a normal bowel movement for a few days.  Please Note:  You might notice some irritation and congestion in your nose or some drainage.  This is from the oxygen used during your procedure.  There is no need for concern and it should clear up in a day or so.  SYMPTOMS TO  REPORT IMMEDIATELY:   Following lower endoscopy (colonoscopy or flexible sigmoidoscopy):  Excessive amounts of blood in the stool  Significant tenderness or worsening of abdominal pains  Swelling of the abdomen that is new, acute  Fever of 100F or higher   Following upper endoscopy (EGD)  Vomiting of blood or coffee ground material  New chest pain or pain under the shoulder blades  Painful or persistently difficult swallowing  New shortness of breath  Fever of 100F or higher  Black, tarry-looking stools  For urgent or emergent issues, a gastroenterologist can be reached at any hour by calling [phone].   DIET:  We do recommend a small meal at first, but then you may proceed to your regular diet.  Please follow a Low-Carb diet (see handout and website given to you by Dr. Leone Payor for this). Drink plenty of fluids but you should avoid alcoholic beverages for 24 hours.  MEDICATIONS: Change PPI to Dexilant 60 mg (RX sent to your pharmacy per Dr. Leone Payor).  Please see handouts given to you by your recovery nurse.  FOLLOW-UP: Follow up with Dr. Leone Payor in 2 months in his office.  ACTIVITY:  You should plan to take it easy for the rest of today and you should NOT DRIVE or use heavy machinery until tomorrow (because of the sedation medicines used during the test).    FOLLOW UP:  Our staff will call the number listed on your records 48-72 hours following your procedure to check on you and address any questions or concerns that you may have regarding the information given to you following your procedure. If we do not reach you, we will leave a message.  We will attempt to reach you two times.  During this call, we will ask if you have developed any symptoms of COVID 19. If you develop any symptoms (ie: fever, flu-like symptoms, shortness of breath, cough etc.) before then, please call [phone].  If you test positive for Covid 19 in the 2 weeks post procedure, please call and report  this information to [name].    If any biopsies were taken you will be contacted by phone or by letter within the next 1-3 weeks.  Please call us at [phone] if you have not heard about the biopsies in 3 weeks.   Thank you for allowing [name] to provide for your healthcare needs today.   SIGNATURES/CONFIDENTIALITY: You and/or your care partner have signed paperwork which will be entered into your electronic medical record.  These signatures attest to the fact that that the information above on your After Visit Summary has been reviewed and is understood.  Full responsibility of the confidentiality of this discharge information lies with you and/or your care-partner.

## 2019-03-01 NOTE — Progress Notes (Signed)
A and O x3. Report to RN. Tolerated MAC anesthesia well.Teeth unchanged after procedure.

## 2019-03-03 ENCOUNTER — Telehealth: Payer: Self-pay

## 2019-03-03 NOTE — Telephone Encounter (Signed)
Opened second one up in error

## 2019-03-03 NOTE — Telephone Encounter (Signed)
  Follow up Call-  Call back number 03/01/2019  Post procedure Call Back phone  # [phone]  Permission to leave phone message Yes  Some recent data might be hidden     Patient questions:  Do you have a fever, pain , or abdominal swelling? No. Pain Score  0 *  Have you tolerated food without any problems? Yes.    Have you been able to return to your normal activities? Yes.    Do you have any questions about your discharge instructions: Diet   No. Medications  No. Follow up visit  No.  Do you have questions or concerns about your Care? No.  Actions: * If pain score is 4 or above: No action needed, pain <4.  1. Have you developed a fever since your procedure? no  2.   Have you had an respiratory symptoms (SOB or cough) since your procedure? no  3.   Have you tested positive for COVID 19 since your procedure no  4.   Have you had any family members/close contacts diagnosed with the COVID 19 since your procedure?  no   If yes to any of these questions please route to Joylene John, RN and Alphonsa Gin, Therapist, sports.

## 2019-03-19 ENCOUNTER — Encounter (HOSPITAL_COMMUNITY): Payer: Self-pay | Admitting: Emergency Medicine

## 2019-03-19 ENCOUNTER — Emergency Department (HOSPITAL_COMMUNITY): Payer: BC Managed Care – PPO

## 2019-03-19 ENCOUNTER — Other Ambulatory Visit: Payer: Self-pay

## 2019-03-19 ENCOUNTER — Emergency Department (HOSPITAL_COMMUNITY)
Admission: EM | Admit: 2019-03-19 | Discharge: 2019-03-19 | Disposition: A | Discharge status: Home / Self Care | Payer: BC Managed Care – PPO | Attending: Emergency Medicine | Admitting: Emergency Medicine

## 2019-03-19 DIAGNOSIS — R0789 Other chest pain: Secondary | ICD-10-CM | POA: Diagnosis not present

## 2019-03-19 DIAGNOSIS — I10 Essential (primary) hypertension: Secondary | ICD-10-CM | POA: Insufficient documentation

## 2019-03-19 DIAGNOSIS — Z79899 Other long term (current) drug therapy: Secondary | ICD-10-CM | POA: Insufficient documentation

## 2019-03-19 DIAGNOSIS — J45909 Unspecified asthma, uncomplicated: Secondary | ICD-10-CM | POA: Diagnosis not present

## 2019-03-19 LAB — CBC WITH DIFFERENTIAL/PLATELET
Abs Immature Granulocytes: 0.06 10*3/uL (ref 0.00–0.07)
Basophils Absolute: 0.1 10*3/uL (ref 0.0–0.1)
Basophils Relative: 1 %
Eosinophils Absolute: 0.8 10*3/uL — ABNORMAL HIGH (ref 0.0–0.5)
Eosinophils Relative: 6 %
HCT: 43.7 % (ref 36.0–46.0)
Hemoglobin: 13.9 g/dL (ref 12.0–15.0)
Immature Granulocytes: 1 %
Lymphocytes Relative: 28 %
Lymphs Abs: 3.5 10*3/uL (ref 0.7–4.0)
MCH: 30.8 pg (ref 26.0–34.0)
MCHC: 31.8 g/dL (ref 30.0–36.0)
MCV: 96.9 fL (ref 80.0–100.0)
Monocytes Absolute: 1.1 10*3/uL — ABNORMAL HIGH (ref 0.1–1.0)
Monocytes Relative: 9 %
Neutro Abs: 6.9 10*3/uL (ref 1.7–7.7)
Neutrophils Relative %: 55 %
Platelets: 345 10*3/uL (ref 150–400)
RBC: 4.51 MIL/uL (ref 3.87–5.11)
RDW: 14.1 % (ref 11.5–15.5)
WBC: 12.3 10*3/uL — ABNORMAL HIGH (ref 4.0–10.5)
nRBC: 0 % (ref 0.0–0.2)

## 2019-03-19 LAB — TROPONIN I (HIGH SENSITIVITY)
Troponin I (High Sensitivity): <2 ng/L (ref <18)
Troponin I (High Sensitivity): <2 ng/L

## 2019-03-19 LAB — COMPREHENSIVE METABOLIC PANEL
ALT: 17 U/L (ref 0–44)
AST: 19 U/L (ref 15–41)
Albumin: 4.2 g/dL (ref 3.5–5.0)
Alkaline Phosphatase: 79 U/L (ref 38–126)
Anion gap: 7 (ref 5–15)
BUN: 13 mg/dL (ref 8–23)
CO2: 26 mmol/L (ref 22–32)
Calcium: 9 mg/dL (ref 8.9–10.3)
Chloride: 106 mmol/L (ref 98–111)
Creatinine, Ser: 0.98 mg/dL (ref 0.44–1.00)
GFR calc Af Amer: >60 mL/min (ref >60)
GFR calc non Af Amer: >60 mL/min (ref >60)
Glucose, Bld: 97 mg/dL (ref 70–99)
Potassium: 3.6 mmol/L (ref 3.5–5.1)
Sodium: 139 mmol/L (ref 135–145)
Total Bilirubin: 1.3 mg/dL — ABNORMAL HIGH (ref 0.3–1.2)
Total Protein: 8 g/dL (ref 6.5–8.1)

## 2019-03-19 NOTE — ED Triage Notes (Signed)
Per pt, states she was talking too her son on the phone when she suddenly got a sharp pain left, mid chest that radiated to her back and right ear-states it lasted for less than 15 minutes-no pain now-some right ear discomfort

## 2019-03-19 NOTE — ED Provider Notes (Addendum)
[facility]-EMERGENCY DEPT Provider Note   CSN: [id number] Arrival date & time: 03/19/19  1704     History   Chief Complaint Chief Complaint  Patient presents with  . Chest Pain    HPI Terri [name] is a 68 y.o. female hx of HTN, here presenting with left-sided chest pain.  Patient states that just prior to arrival, she was driving down [address] and was talking to her son.  She states that she had sudden onset of left-sided sharp chest pain that radiated to her neck .  States that it lasted about 10 minutes.  She went to the grocery store and decided to come here for evaluation. She states that she had normal stress test several years ago has no known history of CAD.  Denies any recent travels or sick contacts.  Denies any fevers or chills.     The history is provided by the patient.    Past Medical History:  Diagnosis Date  . Allergic rhinitis   . Apnea, sleep 06/28/2012   Previously used CPAP  . Asthma   . Atypical chest pain 01/14/2017  . Back pain   . Decreased visual acuity 08/05/2016  . Depression 06/19/2015  . Environmental and seasonal allergies   . GERD (gastroesophageal reflux disease)   . Heartburn   . Hip pain, right 06/28/2012  . HTN (hypertension)   . Hypokalemia 01/14/2017  . Leg pain   . Morbid obesity (HCC) 04/11/2008   Qualifier: Diagnosis of  By: [name]   . Numbness in feet 05/06/2016  . Obesity   . Osteoarthritis 01/16/2010   Qualifier: Diagnosis of  By: [name]   . Preventative health care 06/25/2015  . Shortness of breath   . Shortness of breath on exertion   . Sleep apnea   . Swelling of extremity   . Tinnitus   . Vertigo     Patient Active Problem List   Diagnosis Date Noted  . Cough 07/13/2018  . Encounter for preventative adult health care examination 12/21/2017  . Hyperglycemia 12/16/2017  . Neck pain 12/16/2017  . Vitamin D deficiency 08/25/2017  . Insulin resistance 08/25/2017  . Hypokalemia  01/14/2017  . Atypical chest pain 01/14/2017  . Decreased visual acuity 08/05/2016  . Numbness in feet 05/06/2016  . Colon cancer screening 06/25/2015  . Depression 06/19/2015  . Skin lesion 12/09/2013  . Hip pain, right 06/28/2012  . Apnea, sleep 06/28/2012  . Vertigo 01/07/2011  . Osteoarthritis 01/16/2010  . EDEMA 03/21/2009  . Hyperlipidemia, mixed 06/20/2008  . Morbid obesity (HCC) 04/11/2008  . LATERAL EPICONDYLITIS, LEFT 05/26/2007  . PLANTAR FASCIITIS 05/26/2007  . Essential hypertension 01/31/2007  . Allergic rhinitis 01/31/2007  . Asthma 01/31/2007  . GERD 01/31/2007    Past Surgical History:  Procedure Laterality Date  . BIOPSY BREAST  1997   left   . childbirth    . COLONOSCOPY  2004  . UPPER GASTROINTESTINAL ENDOSCOPY  03/01/2019     OB History    Gravida  1   Para  1   Term  1   Preterm      AB      Living  1     SAB      TAB      Ectopic      Multiple      Live Births               Home Medications  Prior to Admission medications   Medication Sig Start Date End Date Taking? Authorizing Provider  albuterol (PROVENTIL HFA;VENTOLIN HFA) 108 (90 Base) MCG/ACT inhaler Inhale 1-2 puffs into the lungs every 6 (six) hours as needed. 07/24/18  Yes Nche, Bonna Gainsharlotte Lum, NP  amLODipine-olmesartan (AZOR) 10-40 MG tablet TAKE 1 TABLET BY MOUTH EVERY DAY Patient taking differently: Take 1 tablet by mouth daily.  11/16/18  Yes Bradd CanaryBlyth, Stacey A, MD  dexlansoprazole (DEXILANT) 60 MG capsule Take 1 capsule (60 mg total) by mouth daily before breakfast. 03/01/19  Yes Iva BoopGessner, Carl E, MD  famotidine (PEPCID) 40 MG tablet Take 1 tablet (40 mg total) by mouth daily. Patient taking differently: Take 40 mg by mouth at bedtime.  01/07/19  Yes Bradd CanaryBlyth, Stacey A, MD  fluticasone (FLONASE) 50 MCG/ACT nasal spray Place 1 spray into both nostrils daily as needed. 07/13/17  Yes [provider]  levocetirizine (XYZAL) 5 MG tablet Take 5 mg by mouth every  evening.   Yes [provider]  oxybutynin (DITROPAN-XL) 10 MG 24 hr tablet Take 10 mg by mouth daily.    Yes [provider]  EPINEPHrine (EPIPEN 2-PAK) 0.3 mg/0.3 mL IJ SOAJ injection Inject 1 Dose into the muscle as needed for allergies. Inject after allergy shot for anaphylaxis    [provider]  omeprazole (PRILOSEC) 40 MG capsule TAKE 1 CAPSULE BY MOUTH EVERY DAY Patient not taking: Reported on 03/19/2019 03/01/19   Bradd CanaryBlyth, Stacey A, MD  Vitamin D, Ergocalciferol, (DRISDOL) 1.25 MG (50000 UT) CAPS capsule Take 1 capsule (50,000 Units total) by mouth every 7 (seven) days. Patient not taking: Reported on 03/19/2019 12/03/18   Alois ClicheAguilar, Tracey, PA-C    Family History Family History  Problem Relation Age of Onset  . Stroke Mother   . Liver disease Mother        hepatitis b  . Hypertension Mother   . Cancer Father        esophagus, prostate  . Hypertension Father   . Heart disease Father   . Esophageal cancer Father   . Liver disease Brother        hep c  . Mental illness Brother        PTSD from [place]  . Stroke Maternal Grandmother   . Cancer Paternal Grandfather        prostate  . Colitis Neg Hx   . Colon cancer Neg Hx   . Rectal cancer Neg Hx   . Stomach cancer Neg Hx     Social History Social History   Tobacco Use  . Smoking status: Never Smoker  . Smokeless tobacco: Never Used  Substance Use Topics  . Alcohol use: Yes    Comment: social  . Drug use: No     Allergies   Sulfonamide derivatives and Sulfa antibiotics   Review of Systems Review of Systems  Cardiovascular: Positive for chest pain.  All other systems reviewed and are negative.    Physical Exam Updated Vital Signs BP (!) 128/59   Pulse (!) 57   Temp 98 F (36.7 C) (Oral)   Resp 18   SpO2 100%   Physical Exam Vitals signs and nursing note reviewed.  HENT:     Head: Normocephalic.  Eyes:     Pupils: Pupils are equal, round, and reactive to light.  Neck:      Musculoskeletal: Normal range of motion and neck supple.  Cardiovascular:     Rate and Rhythm: Normal rate and regular rhythm.  Heart sounds: Normal heart sounds.  Pulmonary:     Effort: Pulmonary effort is normal.     Breath sounds: Normal breath sounds.  Abdominal:     General: Bowel sounds are normal.     Palpations: Abdomen is soft.  Musculoskeletal: Normal range of motion.  Skin:    General: Skin is warm.     Capillary Refill: Capillary refill takes less than 2 seconds.  Neurological:     General: No focal deficit present.     Mental Status: She is alert and oriented to person, place, and time.  Psychiatric:        Mood and Affect: Mood normal.        Behavior: Behavior normal.      ED Treatments / Results  Labs (all labs ordered are listed, but only abnormal results are displayed) Labs Reviewed  CBC WITH DIFFERENTIAL/PLATELET - Abnormal; Notable for the following components:      Result Value   WBC 12.3 (*)    Monocytes Absolute 1.1 (*)    Eosinophils Absolute 0.8 (*)    All other components within normal limits  COMPREHENSIVE METABOLIC PANEL - Abnormal; Notable for the following components:   Total Bilirubin 1.3 (*)    All other components within normal limits  TROPONIN I (HIGH SENSITIVITY)  TROPONIN I (HIGH SENSITIVITY)    EKG EKG Interpretation  Date/Time:  Friday March 19 2019 17:22:34 EDT Ventricular Rate:  65 PR Interval:    QRS Duration: 93 QT Interval:  430 QTC Calculation: 448 R Axis:   13 Text Interpretation:  Sinus rhythm Low voltage, precordial leads Borderline T wave abnormalities Baseline wander in lead(s) I II aVR aVL No previous ECGs available Confirmed by Wandra Arthurs [phone]) on 03/19/2019 5:51:37 PM   Radiology Dg Chest 2 View  Result Date: 03/19/2019 CLINICAL DATA:  Chest pain EXAM: CHEST - 2 VIEW COMPARISON:  07/24/2018 FINDINGS: The heart size and mediastinal contours are within normal limits. No focal airspace consolidation,  pleural effusion, or pneumothorax. The visualized skeletal structures are unremarkable. IMPRESSION: No active cardiopulmonary disease. Electronically Signed   By: Davina Poke M.D.   On: 03/19/2019 19:07    Procedures Procedures (including critical care time)  Medications Ordered in ED Medications - No data to display   Initial Impression / Assessment and Plan / ED Course  I have reviewed the triage vital signs and the nursing notes.  Pertinent labs & imaging results that were available during my care of the patient were reviewed by me and considered in my medical decision making (see chart for details).       Terri [name] is a 68 y.o. female here with chest pain. Low risk for ACS and I doubt PE or dissection. Will get labs, CXR, trop x 2.    9:50 PM Labs unremarkable. Trop neg x 2. CXR clear. Will dc home.   Final Clinical Impressions(s) / ED Diagnoses   Final diagnoses:  None    ED Discharge Orders    None       Drenda Freeze, MD 03/19/19 2151    Drenda Freeze, MD 03/19/19 2152

## 2019-03-19 NOTE — Discharge Instructions (Signed)
See your doctor. Consider stress test if you still have pain   Return to ER if you have worse chest pain, shortness of breath

## 2019-04-09 ENCOUNTER — Other Ambulatory Visit: Payer: Self-pay

## 2019-04-09 ENCOUNTER — Ambulatory Visit (INDEPENDENT_AMBULATORY_CARE_PROVIDER_SITE_OTHER): Payer: BC Managed Care – PPO | Admitting: Family Medicine

## 2019-04-09 DIAGNOSIS — I10 Essential (primary) hypertension: Secondary | ICD-10-CM

## 2019-04-09 DIAGNOSIS — R739 Hyperglycemia, unspecified: Secondary | ICD-10-CM | POA: Diagnosis not present

## 2019-04-09 DIAGNOSIS — E559 Vitamin D deficiency, unspecified: Secondary | ICD-10-CM | POA: Diagnosis not present

## 2019-04-09 DIAGNOSIS — K219 Gastro-esophageal reflux disease without esophagitis: Secondary | ICD-10-CM | POA: Diagnosis not present

## 2019-04-09 DIAGNOSIS — F3289 Other specified depressive episodes: Secondary | ICD-10-CM

## 2019-04-09 DIAGNOSIS — K449 Diaphragmatic hernia without obstruction or gangrene: Secondary | ICD-10-CM

## 2019-04-09 MED ORDER — HYOSCYAMINE SULFATE 0.125 MG SL SUBL: 0.1250 mg | SUBLINGUAL_TABLET | SUBLINGUAL | 1 refills | Status: DC | PRN

## 2019-04-09 NOTE — Patient Instructions (Signed)
xyzal increase to twice daily or Xyzal in am and Zyrtec/Cetirizine 10 mg in pm Flonase daily Famotidine at bed Dexilant in the morning Add Omeprazole add dinner for 1-2 months  If no improvement then ENT referral

## 2019-04-12 NOTE — Assessment & Plan Note (Signed)
Avoid offending foods, start probiotics. Do not eat large meals in late evening and consider raising head of bed. Has some esophageal spasm and cough at times. Given Hyoscyamine to use prn as this happens infrequently but she will report if worsens or does not respond so we can refer to gastroenterology

## 2019-04-12 NOTE — Assessment & Plan Note (Signed)
Encouraged DASH or MIND diets or Weight Watchers APP, decrease po intake and increase exercise as tolerated. Needs 7-8 hours of sleep nightly. Avoid trans fats, eat small, frequent meals every 4-5 hours with lean proteins, complex carbs and healthy fats. Minimize simple carbs, GMO foods.
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
## 2019-04-12 NOTE — Assessment & Plan Note (Signed)
Monitor weekly and report any concerns, no changes to meds. Encouraged heart healthy diet such as the DASH diet and exercise as tolerated.  

## 2019-04-12 NOTE — Assessment & Plan Note (Signed)
hgba1c acceptable, minimize simple carbs. Increase exercise as tolerated.  

## 2019-04-12 NOTE — Assessment & Plan Note (Signed)
Supplement and monitor 

## 2019-05-08 ENCOUNTER — Other Ambulatory Visit: Payer: Self-pay

## 2019-05-10 MED ORDER — DEXILANT 60 MG PO CPDR: 60.0000 mg | DELAYED_RELEASE_CAPSULE | Freq: Every day | ORAL | 0 refills | Status: DC

## 2019-05-19 ENCOUNTER — Other Ambulatory Visit: Payer: Self-pay | Admitting: Family Medicine

## 2019-06-15 ENCOUNTER — Other Ambulatory Visit: Payer: Self-pay

## 2019-06-15 ENCOUNTER — Ambulatory Visit (INDEPENDENT_AMBULATORY_CARE_PROVIDER_SITE_OTHER): Payer: BC Managed Care – PPO | Admitting: Family Medicine

## 2019-06-15 DIAGNOSIS — M1990 Unspecified osteoarthritis, unspecified site: Secondary | ICD-10-CM | POA: Diagnosis not present

## 2019-06-15 DIAGNOSIS — M545 Low back pain, unspecified: Secondary | ICD-10-CM

## 2019-06-15 DIAGNOSIS — E782 Mixed hyperlipidemia: Secondary | ICD-10-CM

## 2019-06-15 DIAGNOSIS — I10 Essential (primary) hypertension: Secondary | ICD-10-CM

## 2019-06-15 DIAGNOSIS — R739 Hyperglycemia, unspecified: Secondary | ICD-10-CM | POA: Diagnosis not present

## 2019-06-15 DIAGNOSIS — E559 Vitamin D deficiency, unspecified: Secondary | ICD-10-CM

## 2019-06-15 LAB — HM MAMMOGRAPHY: HM Mammogram: ABNORMAL — AB (ref 0–4)

## 2019-06-15 MED ORDER — MELOXICAM 15 MG PO TABS: 15.0000 mg | ORAL_TABLET | Freq: Every day | ORAL | 1 refills | Status: DC | PRN

## 2019-06-15 MED ORDER — TIZANIDINE HCL 2 MG PO TABS: 1.0000 mg | ORAL_TABLET | Freq: Every evening | ORAL | 2 refills | Status: DC | PRN

## 2019-06-16 ENCOUNTER — Other Ambulatory Visit: Payer: Self-pay | Admitting: Obstetrics & Gynecology

## 2019-06-16 DIAGNOSIS — R928 Other abnormal and inconclusive findings on diagnostic imaging of breast: Secondary | ICD-10-CM

## 2019-06-16 DIAGNOSIS — M549 Dorsalgia, unspecified: Secondary | ICD-10-CM | POA: Insufficient documentation

## 2019-06-16 NOTE — Assessment & Plan Note (Signed)
Supplement and monitor 

## 2019-06-16 NOTE — Progress Notes (Signed)
Virtual Visit via Video Note  I connected with Terri [name] on 06/15/2019 at  2:00 PM EST by a video enabled telemedicine application and verified that I am speaking with the correct person using two identifiers.  Location: Patient: home Provider: home   I discussed the limitations of evaluation and management by telemedicine and the availability of in person appointments. The patient expressed understanding and agreed to proceed. Terri [name], CMA was able to get the patient set up on visit, video   Subjective:    [name], female    DOB: 1950/07/01, 69 y.o.   MRN: [id number]  No chief complaint on file.   HPI Patient is in today for follow up on chronic concerns. 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 No recent febrile illness or hospitalizations. She is noting some low back/posterior hip pain with radicular symptoms down leg. No incontinence. No fall or trauma. No polyuria or polydipsia. Denies CP/palp/SOB/HA/congestion/fevers/GI or GU c/o. Taking meds as prescribed.  Past Medical History:  Diagnosis Date  . Allergic rhinitis   . Apnea, sleep 06/28/2012   Previously used CPAP  . Asthma   . Atypical chest pain 01/14/2017  . Back pain   . Decreased visual acuity 08/05/2016  . Depression 06/19/2015  . Environmental and seasonal allergies   . GERD (gastroesophageal reflux disease)   . Heartburn   . Hip pain, right 06/28/2012  . HTN (hypertension)   . Hypokalemia 01/14/2017  . Leg pain   . Morbid obesity (HCC) 04/11/2008   Qualifier: Diagnosis of  By: [name]   . Numbness in feet 05/06/2016  . Obesity   . Osteoarthritis 01/16/2010   Qualifier: Diagnosis of  By: [name]   . Preventative health care 06/25/2015  . Shortness of breath   . Shortness of breath on exertion   . Sleep apnea   . Swelling of extremity   . Tinnitus   . Vertigo     Past Surgical History:  Procedure Laterality Date  . BIOPSY  BREAST  1997   left   . childbirth    . COLONOSCOPY  2004  . UPPER GASTROINTESTINAL ENDOSCOPY  03/01/2019    Family History  Problem Relation Age of Onset  . Stroke Mother   . Liver disease Mother        hepatitis b  . Hypertension Mother   . Cancer Father        esophagus, prostate  . Hypertension Father   . Heart disease Father   . Esophageal cancer Father   . Liver disease Brother        hep c  . Mental illness Brother        PTSD from [place]  . Stroke Maternal Grandmother   . Cancer Paternal Grandfather        prostate  . Colitis Neg Hx   . Colon cancer Neg Hx   . Rectal cancer Neg Hx   . Stomach cancer Neg Hx     Social History   Socioeconomic History  . Marital status: Divorced    Spouse name: Not on file  . Number of children: 1  . Years of education: Not on file  . Highest education level: Not on file  Occupational History  . Occupation: HS music teacher- Location manager  Tobacco Use  . Smoking status: Never Smoker  . Smokeless tobacco: Never Used  Substance and Sexual  Activity  . Alcohol use: Yes    Comment: social  . Drug use: No  . 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 Not on file  Social History Narrative   Single/divorced   1 son born 68 - sometimes there w/ her, leukemia 2016   Music teacher [employer]   Caffeine use 1 cup AM   Never smoker/tobacco   No drugs   Rare-occ EtOHSmoking    Social Determinants of Health   Financial Resource Strain:   . Difficulty of Paying Living Expenses: Not on file  Food Insecurity:   . Worried About Programme researcher, broadcasting/film/video in the Last Year: Not on file  . Ran Out of Food in the Last Year: Not on file  Transportation Needs:   . Lack of Transportation (Medical): Not on file  . Lack of Transportation (Non-Medical): Not on file  Physical Activity:   . Days of Exercise per Week: Not on file  . Minutes of  Exercise per Session: Not on file  Stress:   . Feeling of Stress : Not on file  Social Connections:   . Frequency of Communication with Friends and Family: Not on file  . Frequency of Social Gatherings with Friends and Family: Not on file  . Attends Religious Services: Not on file  . Active Member of Clubs or Organizations: Not on file  . Attends Banker Meetings: Not on file  . Marital Status: Not on file  Intimate Partner Violence:   . Fear of Current or Ex-Partner: Not on file  . Emotionally Abused: Not on file  . Physically Abused: Not on file  . Sexually Abused: Not on file    Outpatient Medications Prior to Visit  Medication Sig Dispense Refill  . albuterol (PROVENTIL HFA;VENTOLIN HFA) 108 (90 Base) MCG/ACT inhaler Inhale 1-2 puffs into the lungs every 6 (six) hours as needed. 1 Inhaler 0  . amLODipine-olmesartan (AZOR) 10-40 MG tablet TAKE 1 TABLET BY MOUTH EVERY DAY 90 tablet 1  . dexlansoprazole (DEXILANT) 60 MG capsule Take 1 capsule (60 mg total) by mouth daily before breakfast. 90 capsule 0  . EPINEPHrine (EPIPEN 2-PAK) 0.3 mg/0.3 mL IJ SOAJ injection Inject 1 Dose into the muscle as needed for allergies. Inject after allergy shot for anaphylaxis    . famotidine (PEPCID) 40 MG tablet Take 1 tablet (40 mg total) by mouth daily. (Patient taking differently: Take 40 mg by mouth at bedtime. ) 90 tablet 1  . fluticasone (FLONASE) 50 MCG/ACT nasal spray Place 1 spray into both nostrils daily as needed.    . hyoscyamine (LEVSIN SL) 0.125 MG SL tablet Place 1 tablet (0.125 mg total) under the tongue every 4 (four) hours as needed. 30 tablet 1  . levocetirizine (XYZAL) 5 MG tablet Take 5 mg by mouth every evening.    Marland Kitchen oxybutynin (DITROPAN-XL) 10 MG 24 hr tablet Take 10 mg by mouth daily.     . Vitamin D, Ergocalciferol, (DRISDOL) 1.25 MG (50000 UT) CAPS capsule Take 1 capsule (50,000 Units total) by mouth every 7 (seven) days. (Patient not taking: Reported on 03/19/2019)  4 capsule 0  . omeprazole (PRILOSEC) 40 MG capsule TAKE 1 CAPSULE BY MOUTH EVERY DAY (Patient not taking: Reported on 03/19/2019) 90 capsule 0   No facility-administered medications prior to visit.    Allergies  Allergen Reactions  . Sulfonamide Derivatives   .  Sulfa Antibiotics Rash    Review of Systems  Constitutional: Negative for fever and malaise/fatigue.  HENT: Negative for congestion.   Eyes: Negative for blurred vision.  Respiratory: Negative for shortness of breath.   Cardiovascular: Negative for chest pain, palpitations and leg swelling.  Gastrointestinal: Negative for abdominal pain, blood in stool and nausea.  Genitourinary: Negative for dysuria and frequency.  Musculoskeletal: Positive for back pain and joint pain. Negative for falls.  Skin: Negative for rash.  Neurological: Negative for dizziness, loss of consciousness and headaches.  Endo/Heme/Allergies: Negative for environmental allergies.  Psychiatric/Behavioral: Negative for depression. The patient is not nervous/anxious.        Objective:    Physical Exam Constitutional:      Appearance: Normal appearance. She is not ill-appearing.  HENT:     Head: Normocephalic and atraumatic.     Nose: Nose normal.  Eyes:     General:        Right eye: No discharge.        Left eye: No discharge.  Pulmonary:     Effort: Pulmonary effort is normal.  Neurological:     Mental Status: She is alert and oriented to person, place, and time.  Psychiatric:        Behavior: Behavior normal.     There were no vitals taken for this visit. Wt Readings from Last 3 Encounters:  03/01/19 258 lb (117 kg)  02/16/19 258 lb 2 oz (117.1 kg)  01/07/19 259 lb 12.8 oz (117.8 kg)    Diabetic Foot Exam - Simple   No data filed     Lab Results  Component Value Date   WBC 12.3 (H) 03/19/2019   HGB 13.9 03/19/2019   HCT 43.7 03/19/2019   PLT 345 03/19/2019   GLUCOSE 97 03/19/2019   CHOL 152 01/07/2019   TRIG 95.0  01/07/2019   HDL 46.80 01/07/2019   LDLCALC 87 01/07/2019   ALT 17 03/19/2019   AST 19 03/19/2019   NA 139 03/19/2019   K 3.6 03/19/2019   CL 106 03/19/2019   CREATININE 0.98 03/19/2019   BUN 13 03/19/2019   CO2 26 03/19/2019   TSH 2.43 01/07/2019   HGBA1C 5.3 12/24/2018    Lab Results  Component Value Date   TSH 2.43 01/07/2019   Lab Results  Component Value Date   WBC 12.3 (H) 03/19/2019   HGB 13.9 03/19/2019   HCT 43.7 03/19/2019   MCV 96.9 03/19/2019   PLT 345 03/19/2019   Lab Results  Component Value Date   NA 139 03/19/2019   K 3.6 03/19/2019   CO2 26 03/19/2019   GLUCOSE 97 03/19/2019   BUN 13 03/19/2019   CREATININE 0.98 03/19/2019   BILITOT 1.3 (H) 03/19/2019   ALKPHOS 79 03/19/2019   AST 19 03/19/2019   ALT 17 03/19/2019   PROT 8.0 03/19/2019   ALBUMIN 4.2 03/19/2019   CALCIUM 9.0 03/19/2019   ANIONGAP 7 03/19/2019   GFR 73.04 07/13/2018   Lab Results  Component Value Date   CHOL 152 01/07/2019   Lab Results  Component Value Date   HDL 46.80 01/07/2019   Lab Results  Component Value Date   LDLCALC 87 01/07/2019   Lab Results  Component Value Date   TRIG 95.0 01/07/2019   Lab Results  Component Value Date   CHOLHDL 3 01/07/2019   Lab Results  Component Value Date   HGBA1C 5.3 12/24/2018       Assessment & Plan:  Problem List Items Addressed This Visit    Hyperlipidemia, mixed    Encouraged heart healthy diet, increase exercise, avoid trans fats, consider a krill oil cap daily      Essential hypertension    Monitor and report any concerns, no changes to meds. Encouraged heart healthy diet such as the DASH diet and exercise as tolerated.       Osteoarthritis    Encouraged to stay as active as able, mild weight loss encouraged      Relevant Medications   tiZANidine (ZANAFLEX) 2 MG tablet   meloxicam (MOBIC) 15 MG tablet   Vitamin D deficiency    Supplement and monitor      Hyperglycemia    hgba1c acceptable, minimize  simple carbs. Increase exercise as tolerated.      Back pain    Has been struggling with posterior left hip pain with pain radiating down lateral right thigh intermittently. Will try Meloxicam 15 mg daily in am and Tizanidine qhs, moist heat and gentle stretching. If no improvement consider consultation with sports medicine      Relevant Medications   tiZANidine (ZANAFLEX) 2 MG tablet   meloxicam (MOBIC) 15 MG tablet      I have discontinued Olin Hauser E. Fedorchak's omeprazole. I am also having her start on tiZANidine and meloxicam. Additionally, I am having her maintain her oxybutynin, levocetirizine, albuterol, Vitamin D (Ergocalciferol), famotidine, fluticasone, EPINEPHrine, hyoscyamine, Dexilant, and amLODipine-olmesartan.  Meds ordered this encounter  Medications  . tiZANidine (ZANAFLEX) 2 MG tablet    Sig: Take 0.5-2 tablets (1-4 mg total) by mouth at bedtime as needed for muscle spasms.    Dispense:  30 tablet    Refill:  2  . meloxicam (MOBIC) 15 MG tablet    Sig: Take 1 tablet (15 mg total) by mouth daily as needed for pain.    Dispense:  30 tablet    Refill:  1      I discussed the assessment and treatment plan with the patient. The patient was provided an opportunity to ask questions and all were answered. The patient agreed with the plan and demonstrated an understanding of the instructions.   The patient was advised to call back or seek an in-person evaluation if the symptoms worsen or if the condition fails to improve as anticipated.  I provided 25 minutes of non-face-to-face time during this encounter.   Penni Homans, MD

## 2019-06-16 NOTE — Assessment & Plan Note (Signed)
Encouraged heart healthy diet, increase exercise, avoid trans fats, consider a krill oil cap daily 

## 2019-06-16 NOTE — Assessment & Plan Note (Signed)
Monitor and report any concerns, no changes to meds. Encouraged heart healthy diet such as the DASH diet and exercise as tolerated.  ?

## 2019-06-16 NOTE — Assessment & Plan Note (Signed)
hgba1c acceptable, minimize simple carbs. Increase exercise as tolerated.  

## 2019-06-16 NOTE — Assessment & Plan Note (Signed)
Encouraged to stay as active as able, mild weight loss encouraged

## 2019-06-16 NOTE — Assessment & Plan Note (Addendum)
Has been struggling with posterior left hip pain with pain radiating down lateral right thigh intermittently. Will try Meloxicam 15 mg daily in am and Tizanidine qhs, moist heat and gentle stretching. If no improvement consider consultation with sports medicine

## 2019-06-21 ENCOUNTER — Encounter: Payer: Self-pay | Admitting: Family Medicine

## 2019-06-23 ENCOUNTER — Ambulatory Visit
Admission: RE | Admit: 2019-06-23 | Discharge: 2019-06-23 | Disposition: A | Discharge status: Home / Self Care | Payer: BC Managed Care – PPO | Source: Ambulatory Visit | Attending: Obstetrics & Gynecology | Admitting: Obstetrics & Gynecology

## 2019-06-23 ENCOUNTER — Other Ambulatory Visit: Payer: Self-pay

## 2019-06-23 DIAGNOSIS — R928 Other abnormal and inconclusive findings on diagnostic imaging of breast: Secondary | ICD-10-CM

## 2019-06-25 ENCOUNTER — Other Ambulatory Visit: Payer: Self-pay | Admitting: Family Medicine

## 2019-06-30 ENCOUNTER — Telehealth: Payer: Self-pay | Admitting: Family Medicine

## 2019-06-30 MED ORDER — AMLODIPINE-OLMESARTAN 10-40 MG PO TABS: 1.0000 | ORAL_TABLET | Freq: Every day | ORAL | 0 refills | Status: DC

## 2019-06-30 NOTE — Telephone Encounter (Signed)
Left message on machine to call back  Not sure why [facility] denied refill, patient has refills.  Patient will may have to pay out of pocket.  Patient will need to see if she can get an override from her ins to pay, since it will be too soon to fill.  If the prescription has refills it can be filled but just ins will not pay.

## 2019-06-30 NOTE — Telephone Encounter (Signed)
Patient will use good rx for 10 tablets at [facility] for around $15.20.  Rx sent to pharmacy.

## 2019-06-30 NOTE — Telephone Encounter (Signed)
amLODipine-olmesartan (AZOR) 10-40 MG tablet  Patient states that she is out of town in [place] and forgot medication at home in [place]  She went to Pharmacy asking to get a few pill until she returns to [facility] The pharmacy Denied it.   Patient states she needs like 3 pills  Pharmacy Prefers :  [facility]  [address] [place], [place] [address] [phone]

## 2019-07-09 ENCOUNTER — Other Ambulatory Visit: Payer: Self-pay | Admitting: Family Medicine

## 2019-07-21 ENCOUNTER — Telehealth: Payer: Self-pay

## 2019-07-21 NOTE — Telephone Encounter (Signed)
LM requesting call back.  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 Need to determine if she plans to pick up or would like it faxed. (No fax number provided).

## 2019-07-31 ENCOUNTER — Other Ambulatory Visit: Payer: Self-pay | Admitting: Internal Medicine

## 2019-07-31 ENCOUNTER — Other Ambulatory Visit: Payer: Self-pay | Admitting: Family Medicine

## 2019-08-13 ENCOUNTER — Other Ambulatory Visit: Payer: Self-pay

## 2019-08-13 ENCOUNTER — Other Ambulatory Visit: Payer: Self-pay | Admitting: Family Medicine

## 2019-08-16 ENCOUNTER — Other Ambulatory Visit: Payer: Self-pay | Admitting: Family Medicine

## 2019-08-16 ENCOUNTER — Other Ambulatory Visit: Payer: Self-pay

## 2019-08-16 ENCOUNTER — Ambulatory Visit (HOSPITAL_BASED_OUTPATIENT_CLINIC_OR_DEPARTMENT_OTHER)
Admission: RE | Admit: 2019-08-16 | Discharge: 2019-08-16 | Disposition: A | Discharge status: Home / Self Care | Payer: BC Managed Care – PPO | Source: Ambulatory Visit | Attending: Family Medicine | Admitting: Family Medicine

## 2019-08-16 ENCOUNTER — Ambulatory Visit: Payer: BC Managed Care – PPO | Admitting: Family Medicine

## 2019-08-16 VITALS — BP 139/60 | HR 70 | Temp 97.1°F | Resp 12 | Ht 64.0 in | Wt 257.2 lb

## 2019-08-16 DIAGNOSIS — R059 Cough, unspecified: Secondary | ICD-10-CM

## 2019-08-16 DIAGNOSIS — R739 Hyperglycemia, unspecified: Secondary | ICD-10-CM | POA: Diagnosis not present

## 2019-08-16 DIAGNOSIS — J209 Acute bronchitis, unspecified: Secondary | ICD-10-CM

## 2019-08-16 DIAGNOSIS — J309 Allergic rhinitis, unspecified: Secondary | ICD-10-CM

## 2019-08-16 DIAGNOSIS — E559 Vitamin D deficiency, unspecified: Secondary | ICD-10-CM

## 2019-08-16 DIAGNOSIS — R05 Cough: Secondary | ICD-10-CM

## 2019-08-16 DIAGNOSIS — E669 Obesity, unspecified: Secondary | ICD-10-CM

## 2019-08-16 DIAGNOSIS — I10 Essential (primary) hypertension: Secondary | ICD-10-CM

## 2019-08-16 DIAGNOSIS — J45909 Unspecified asthma, uncomplicated: Secondary | ICD-10-CM

## 2019-08-16 DIAGNOSIS — E782 Mixed hyperlipidemia: Secondary | ICD-10-CM

## 2019-08-16 LAB — COMPREHENSIVE METABOLIC PANEL
ALT: 16 U/L (ref 0–35)
AST: 16 U/L (ref 0–37)
Albumin: 3.9 g/dL (ref 3.5–5.2)
Alkaline Phosphatase: 80 U/L (ref 39–117)
BUN: 13 mg/dL (ref 6–23)
CO2: 29 mEq/L (ref 19–32)
Calcium: 9.2 mg/dL (ref 8.4–10.5)
Chloride: 106 mEq/L (ref 96–112)
Creatinine, Ser: 0.96 mg/dL (ref 0.40–1.20)
GFR: 70.18 mL/min (ref >60.00)
Glucose, Bld: 107 mg/dL — ABNORMAL HIGH (ref 70–99)
Potassium: 4.3 mEq/L (ref 3.5–5.1)
Sodium: 139 mEq/L (ref 135–145)
Total Bilirubin: 0.6 mg/dL (ref 0.2–1.2)
Total Protein: 7 g/dL (ref 6.0–8.3)

## 2019-08-16 LAB — LIPID PANEL
Cholesterol: 163 mg/dL (ref 0–200)
HDL: 47.8 mg/dL (ref >39.00)
LDL Cholesterol: 95 mg/dL (ref 0–99)
NonHDL: 114.95
Total CHOL/HDL Ratio: 3
Triglycerides: 99 mg/dL (ref 0.0–149.0)
VLDL: 19.8 mg/dL (ref 0.0–40.0)

## 2019-08-16 LAB — CBC
HCT: 40.5 % (ref 36.0–46.0)
Hemoglobin: 13.4 g/dL (ref 12.0–15.0)
MCHC: 33.2 g/dL (ref 30.0–36.0)
MCV: 94.7 fl (ref 78.0–100.0)
Platelets: 327 10*3/uL (ref 150.0–400.0)
RBC: 4.27 Mil/uL (ref 3.87–5.11)
RDW: 14.6 % (ref 11.5–15.5)
WBC: 8.2 10*3/uL (ref 4.0–10.5)

## 2019-08-16 LAB — VITAMIN D 25 HYDROXY (VIT D DEFICIENCY, FRACTURES): VITD: 25.78 ng/mL — ABNORMAL LOW (ref 30.00–100.00)

## 2019-08-16 LAB — HEMOGLOBIN A1C: Hgb A1c MFr Bld: 5.3 % (ref 4.6–6.5)

## 2019-08-16 LAB — TSH: TSH: 2.36 u[IU]/mL (ref 0.35–4.50)

## 2019-08-16 MED ORDER — MONTELUKAST SODIUM 10 MG PO TABS: 10.0000 mg | ORAL_TABLET | Freq: Every day | ORAL | 3 refills | Status: DC

## 2019-08-16 MED ORDER — VITAMIN D (ERGOCALCIFEROL) 1.25 MG (50000 UNIT) PO CAPS: 50000.0000 [IU] | ORAL_CAPSULE | ORAL | 4 refills | Status: DC

## 2019-08-16 MED ORDER — ALBUTEROL SULFATE HFA 108 (90 BASE) MCG/ACT IN AERS: 1.0000 | INHALATION_SPRAY | Freq: Four times a day (QID) | RESPIRATORY_TRACT | 3 refills | Status: DC | PRN

## 2019-08-16 MED ORDER — FLOVENT HFA 220 MCG/ACT IN AERO: 1.0000 | INHALATION_SPRAY | Freq: Two times a day (BID) | RESPIRATORY_TRACT | 12 refills | Status: DC

## 2019-08-16 NOTE — Assessment & Plan Note (Signed)
Supplement and monitor 

## 2019-08-16 NOTE — Progress Notes (Addendum)
Subjective:    [name], female    DOB: [birth year], 69 y.o.   MRN: [id number]  Chief Complaint  Patient presents with  . Follow-up    HPI Patient is in today for follow up on chronic medical concerns. No recent febrile illness or hospitalizations. She is in the process of retiring from the schools. She has chosen not to get the COVID shots. Denies CP/palp/SOB/HA/congestion/fevers/GI or GU c/o. Taking meds as prescribed.   Past Medical History:  Diagnosis Date  . Allergic rhinitis   . Apnea, sleep 06/28/2012   Previously used CPAP  . Asthma   . Atypical chest pain 01/14/2017  . Back pain   . Decreased visual acuity 08/05/2016  . Depression 06/19/2015  . Environmental and seasonal allergies   . GERD (gastroesophageal reflux disease)   . Heartburn   . Hip pain, right 06/28/2012  . HTN (hypertension)   . Hypokalemia 01/14/2017  . Leg pain   . Morbid obesity (HCC) 04/11/2008   Qualifier: Diagnosis of  By: [name]   . Numbness in feet 05/06/2016  . Obesity   . Osteoarthritis 01/16/2010   Qualifier: Diagnosis of  By: [name]   . Preventative health care 06/25/2015  . Shortness of breath   . Shortness of breath on exertion   . Sleep apnea   . Swelling of extremity   . Tinnitus   . Vertigo     Past Surgical History:  Procedure Laterality Date  . BIOPSY BREAST  1997   left   . childbirth    . COLONOSCOPY  2004  . UPPER GASTROINTESTINAL ENDOSCOPY  03/01/2019    Family History  Problem Relation Age of Onset  . Stroke Mother   . Liver disease Mother        hepatitis b  . Hypertension Mother   . Cancer Father        esophagus, prostate  . Hypertension Father   . Heart disease Father   . Esophageal cancer Father   . Liver disease Brother        hep c  . Mental illness Brother        PTSD from [place]  . Stroke Maternal Grandmother   . Cancer Paternal Grandfather        prostate  . Colitis Neg Hx   . Colon cancer Neg Hx   .  Rectal cancer Neg Hx   . Stomach cancer Neg Hx     Social History   Socioeconomic History  . Marital status: Divorced    Spouse name: Not on file  . Number of children: 1  . Years of education: Not on file  . Highest education level: Not on file  Occupational History  . Occupation: HS music teacher- Location manager  Tobacco Use  . Smoking status: Never Smoker  . Smokeless tobacco: Never Used  Substance and Sexual Activity  . Alcohol use: Yes    Comment: social  . Drug use: No  . 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 Not on file  Social History Narrative   Single/divorced   1 son born 90 - sometimes there w/ her, leukemia 2016   Music teacher [employer]   Caffeine use 1 cup AM   Never smoker/tobacco   No drugs   Rare-occ EtOHSmoking    Social Determinants of Health  Financial Resource Strain:   . Difficulty of Paying Living Expenses:   Food Insecurity:   . Worried About Programme researcher, broadcasting/film/video in the Last Year:   . Barista in the Last Year:   Transportation Needs:   . Freight forwarder (Medical):   Marland Kitchen Lack of Transportation (Non-Medical):   Physical Activity:   . Days of Exercise per Week:   . Minutes of Exercise per Session:   Stress:   . Feeling of Stress :   Social Connections:   . Frequency of Communication with Friends and Family:   . Frequency of Social Gatherings with Friends and Family:   . Attends Religious Services:   . Active Member of Clubs or Organizations:   . Attends Banker Meetings:   Marland Kitchen Marital Status:   Intimate Partner Violence:   . Fear of Current or Ex-Partner:   . Emotionally Abused:   Marland Kitchen Physically Abused:   . Sexually Abused:     Outpatient Medications Prior to Visit  Medication Sig Dispense Refill  . amLODipine-olmesartan (AZOR) 10-40 MG tablet TAKE 1 TABLET BY MOUTH EVERY DAY 90 tablet 1  . DEXILANT 60 MG  capsule TAKE 1 CAPSULE (60 MG TOTAL) BY MOUTH DAILY BEFORE BREAKFAST. 90 capsule 0  . EPINEPHrine (EPIPEN 2-PAK) 0.3 mg/0.3 mL IJ SOAJ injection Inject 1 Dose into the muscle as needed for allergies. Inject after allergy shot for anaphylaxis    . famotidine (PEPCID) 40 MG tablet TAKE 1 TABLET BY MOUTH EVERY DAY 90 tablet 1  . fluticasone (FLONASE) 50 MCG/ACT nasal spray Place 1 spray into both nostrils daily as needed.    Marland Kitchen levocetirizine (XYZAL) 5 MG tablet Take 5 mg by mouth every evening.    . meloxicam (MOBIC) 15 MG tablet TAKE 1 TABLET BY MOUTH EVERY DAY AS NEEDED FOR PAIN 30 tablet 1  . oxybutynin (DITROPAN-XL) 10 MG 24 hr tablet Take 10 mg by mouth daily.     Marland Kitchen tiZANidine (ZANAFLEX) 2 MG tablet TAKE 1/2 TO 2 TABLETS (1-4 MG TOTAL) BY MOUTH AT BEDTIME AS NEEDED FOR MUSCLE SPASMS. 180 tablet 1  . Vitamin D, Ergocalciferol, (DRISDOL) 1.25 MG (50000 UT) CAPS capsule Take 1 capsule (50,000 Units total) by mouth every 7 (seven) days. 4 capsule 0  . albuterol (PROVENTIL HFA;VENTOLIN HFA) 108 (90 Base) MCG/ACT inhaler Inhale 1-2 puffs into the lungs every 6 (six) hours as needed. 1 Inhaler 0  . hyoscyamine (LEVSIN SL) 0.125 MG SL tablet Place 1 tablet (0.125 mg total) under the tongue every 4 (four) hours as needed. 30 tablet 1   No facility-administered medications prior to visit.    Allergies  Allergen Reactions  . Sulfonamide Derivatives   . Sulfa Antibiotics Rash    Review of Systems  Constitutional: Negative for fever and malaise/fatigue.  HENT: Negative for congestion.   Eyes: Negative for blurred vision.  Respiratory: Negative for shortness of breath.   Cardiovascular: Negative for chest pain, palpitations and leg swelling.  Gastrointestinal: Negative for abdominal pain, blood in stool and nausea.  Genitourinary: Negative for dysuria and frequency.  Musculoskeletal: Negative for falls.  Skin: Negative for rash.  Neurological: Negative for dizziness, loss of consciousness and  headaches.  Endo/Heme/Allergies: Negative for environmental allergies.  Psychiatric/Behavioral: Negative for depression. The patient is not nervous/anxious.        Objective:    Physical Exam Vitals and nursing note reviewed.  Constitutional:      General: She is not in  acute distress.    Appearance: Normal appearance. She is well-developed. She is obese. She is not ill-appearing.  HENT:     Head: Normocephalic and atraumatic.     Nose: Nose normal.  Eyes:     General:        Right eye: No discharge.        Left eye: No discharge.  Cardiovascular:     Rate and Rhythm: Normal rate and regular rhythm.     Heart sounds: No murmur.  Pulmonary:     Effort: Pulmonary effort is normal.     Breath sounds: Normal breath sounds.  Abdominal:     General: Bowel sounds are normal.     Palpations: Abdomen is soft.     Tenderness: There is no abdominal tenderness.  Musculoskeletal:     Cervical back: Normal range of motion and neck supple.  Skin:    General: Skin is warm and dry.  Neurological:     Mental Status: She is alert and oriented to person, place, and time.  Psychiatric:        Mood and Affect: Mood normal.        Behavior: Behavior normal.     BP 139/60 (BP Location: Left Arm, Cuff Size: Large)   Pulse 70   Temp (!) 97.1 F (36.2 C) (Temporal)   Resp 12   Ht 5\' 4"  (1.626 m)   Wt 257 lb 3.2 oz (116.7 kg)   SpO2 100%   BMI 44.15 kg/m  Wt Readings from Last 3 Encounters:  08/16/19 257 lb 3.2 oz (116.7 kg)  03/01/19 258 lb (117 kg)  02/16/19 258 lb 2 oz (117.1 kg)    Diabetic Foot Exam - Simple   No data filed     Lab Results  Component Value Date   WBC 12.3 (H) 03/19/2019   HGB 13.9 03/19/2019   HCT 43.7 03/19/2019   PLT 345 03/19/2019   GLUCOSE 97 03/19/2019   CHOL 152 01/07/2019   TRIG 95.0 01/07/2019   HDL 46.80 01/07/2019   LDLCALC 87 01/07/2019   ALT 17 03/19/2019   AST 19 03/19/2019   NA 139 03/19/2019   K 3.6 03/19/2019   CL 106 03/19/2019     CREATININE 0.98 03/19/2019   BUN 13 03/19/2019   CO2 26 03/19/2019   TSH 2.43 01/07/2019   HGBA1C 5.3 12/24/2018    Lab Results  Component Value Date   TSH 2.43 01/07/2019   Lab Results  Component Value Date   WBC 12.3 (H) 03/19/2019   HGB 13.9 03/19/2019   HCT 43.7 03/19/2019   MCV 96.9 03/19/2019   PLT 345 03/19/2019   Lab Results  Component Value Date   NA 139 03/19/2019   K 3.6 03/19/2019   CO2 26 03/19/2019   GLUCOSE 97 03/19/2019   BUN 13 03/19/2019   CREATININE 0.98 03/19/2019   BILITOT 1.3 (H) 03/19/2019   ALKPHOS 79 03/19/2019   AST 19 03/19/2019   ALT 17 03/19/2019   PROT 8.0 03/19/2019   ALBUMIN 4.2 03/19/2019   CALCIUM 9.0 03/19/2019   ANIONGAP 7 03/19/2019   GFR 73.04 07/13/2018   Lab Results  Component Value Date   CHOL 152 01/07/2019   Lab Results  Component Value Date   HDL 46.80 01/07/2019   Lab Results  Component Value Date   LDLCALC 87 01/07/2019   Lab Results  Component Value Date   TRIG 95.0 01/07/2019   Lab Results  Component Value Date  CHOLHDL 3 01/07/2019   Lab Results  Component Value Date   HGBA1C 5.3 12/24/2018       Assessment & Plan:   Problem List Items Addressed This Visit    Hyperlipidemia, mixed    Tolerating statin, encouraged heart healthy diet, avoid trans fats, minimize simple carbs and saturated fats. Increase exercise as tolerated      Relevant Orders   Lipid panel   Obesity    Encouraged DASH or MIND diet, decrease po intake and increase exercise as tolerated. Needs 7-8 hours of sleep nightly. Avoid trans fats, eat small, frequent meals every 4-5 hours with lean proteins, complex carbs and healthy fats. Minimize simple carbs      Essential hypertension - Primary    Well controlled, no changes to meds. Encouraged heart healthy diet such as the DASH diet and exercise as tolerated.       Relevant Orders   TSH   CBC   Allergic rhinitis   Relevant Medications   montelukast (SINGULAIR) 10 MG  tablet   Other Relevant Orders   Ambulatory referral to ENT   Asthma    Scant wheezing RLL. Refilled albuterol and started on Flovent 220 mcg 1 puff po bid and singulair 10 mg po daily      Relevant Medications   montelukast (SINGULAIR) 10 MG tablet   fluticasone (FLOVENT HFA) 220 MCG/ACT inhaler   albuterol (VENTOLIN HFA) 108 (90 Base) MCG/ACT inhaler   Vitamin D deficiency    Supplement and monitor      Relevant Orders   Comprehensive metabolic panel   VITAMIN D 25 Hydroxy (Vit-D Deficiency, Fractures)   Hyperglycemia    hgba1c acceptable, minimize simple carbs. Increase exercise as tolerated.       Relevant Orders   Hemoglobin A1c   Cough    Persistent worse in the evening and first thing in the morning. Feels rattling sometimes but never productive. She hydrates well. Heartburn is improving but can occur. Possible cough variant asthma but will check CXR and Quantiferon Gold. Referred to ENT for visualization of vocal cords as cough has been present to some degree for the past year.       Relevant Medications   montelukast (SINGULAIR) 10 MG tablet   Other Relevant Orders   DG Chest 2 View   Ambulatory referral to ENT   QuantiFERON-TB Gold Plus    Other Visit Diagnoses    Acute bronchitis, unspecified organism       Relevant Medications   albuterol (VENTOLIN HFA) 108 (90 Base) MCG/ACT inhaler      I have discontinued Olin Hauser E. Wiechman's hyoscyamine. I have also changed her albuterol. Additionally, I am having her start on montelukast and Flovent HFA. Lastly, I am having her maintain her oxybutynin, levocetirizine, Vitamin D (Ergocalciferol), fluticasone, EPINEPHrine, amLODipine-olmesartan, tiZANidine, famotidine, Dexilant, and meloxicam.  Meds ordered this encounter  Medications  . montelukast (SINGULAIR) 10 MG tablet    Sig: Take 1 tablet (10 mg total) by mouth at bedtime.    Dispense:  30 tablet    Refill:  3  . fluticasone (FLOVENT HFA) 220 MCG/ACT inhaler     Sig: Inhale 1 puff into the lungs in the morning and at bedtime.    Dispense:  1 Inhaler    Refill:  12  . albuterol (VENTOLIN HFA) 108 (90 Base) MCG/ACT inhaler    Sig: Inhale 1-2 puffs into the lungs every 6 (six) hours as needed.    Dispense:  18 g  Refill:  3     Danise Edge, MD

## 2019-08-16 NOTE — Assessment & Plan Note (Signed)
Scant wheezing RLL. Refilled albuterol and started on Flovent 220 mcg 1 puff po bid and singulair 10 mg po daily

## 2019-08-16 NOTE — Assessment & Plan Note (Signed)
Well controlled, no changes to meds. Encouraged heart healthy diet such as the DASH diet and exercise as tolerated.  °

## 2019-08-16 NOTE — Assessment & Plan Note (Signed)
hgba1c acceptable, minimize simple carbs. Increase exercise as tolerated.  

## 2019-08-16 NOTE — Addendum Note (Signed)
Addended by: Danise Edge A on: 08/16/2019 12:56 PM   Modules accepted: Orders

## 2019-08-16 NOTE — Assessment & Plan Note (Signed)
Tolerating statin, encouraged heart healthy diet, avoid trans fats, minimize simple carbs and saturated fats. Increase exercise as tolerated 

## 2019-08-16 NOTE — Assessment & Plan Note (Signed)
Encouraged DASH or MIND diet, decrease po intake and increase exercise as tolerated. Needs 7-8 hours of sleep nightly. Avoid trans fats, eat small, frequent meals every 4-5 hours with lean proteins, complex carbs and healthy fats. Minimize simple carbs 

## 2019-08-16 NOTE — Assessment & Plan Note (Addendum)
Persistent worse in the evening and first thing in the morning. Feels rattling sometimes but never productive. She hydrates well. Heartburn is improving but can occur. Possible cough variant asthma but will check CXR and Quantiferon Gold. Referred to ENT for visualization of vocal cords as cough has been present to some degree for the past year.

## 2019-08-16 NOTE — Patient Instructions (Signed)
Omron Blood Pressure cuff, upper arm, want BP 100-140/60-90 Pulse oximeter, want oxygen in 90s  Weekly vitals  Take Multivitamin with minerals, selenium Vitamin D 1000-2000 IU daily Probiotic with lactobacillus and bifidophilus Asprin EC 81 mg daily  Melatonin 2-5 mg at bedtime  [email or web address]  The mRNA technology has been in development for 20 years and we already had the Coronavirus family of viruses (which usually just cause the common cold) genetically mapped already which is why we were able to come up with viable vaccine candidates so quickly in stage 1, then stage 2 scientifically took the correct amount of time what we did to speed it up was just build the manufacturing platform at the same time we were running the experiments so if it worked we could produce faster. And stage 3 has now had many months and millions of people immunized and we are seeing the immunity hold for over 9 months now with sign of it dissipating and no significant numbers of adverse reactions.  During every flu season we see 2 anaphylactic reactions for every million shots given and we initially thought we would see 11 per million with the COVID vaccine but now we see only 2-3 with Moderna and 5 or so with Pfizer so compared to someone is dying every 20 minutes from COVID and more deadly and infectious strains are coming it is definitely best when weighing the risks and benefits to take the shots.  Another pooled analysis of the 5 most utilized vaccines in the world shows that after full immunization so far no one has died from COVID.  

## 2019-08-18 LAB — QUANTIFERON-TB GOLD PLUS
Mitogen-NIL: >10 IU/mL
NIL: 0.06 IU/mL
QuantiFERON-TB Gold Plus: NEGATIVE
TB1-NIL: 0.13 IU/mL
TB2-NIL: 0.05 IU/mL

## 2019-08-31 ENCOUNTER — Other Ambulatory Visit: Payer: Self-pay

## 2019-08-31 ENCOUNTER — Ambulatory Visit (INDEPENDENT_AMBULATORY_CARE_PROVIDER_SITE_OTHER): Payer: BC Managed Care – PPO | Admitting: Otolaryngology

## 2019-08-31 ENCOUNTER — Encounter (INDEPENDENT_AMBULATORY_CARE_PROVIDER_SITE_OTHER): Payer: Self-pay | Admitting: Otolaryngology

## 2019-08-31 VITALS — Temp 97.9°F

## 2019-08-31 DIAGNOSIS — J4530 Mild persistent asthma, uncomplicated: Secondary | ICD-10-CM | POA: Diagnosis not present

## 2019-08-31 DIAGNOSIS — J310 Chronic rhinitis: Secondary | ICD-10-CM | POA: Diagnosis not present

## 2019-08-31 DIAGNOSIS — R053 Chronic cough: Secondary | ICD-10-CM

## 2019-08-31 DIAGNOSIS — R05 Cough: Secondary | ICD-10-CM

## 2019-08-31 NOTE — Progress Notes (Signed)
HPI: Terri [name] is a 69 y.o. female who presents is referred by Dr. Abner Greenspan for evaluation of a chronic cough she has had for over a year.  She has history of nasal allergies as well as asthma.  She was told about a year ago that she had bronchitis.  She has just recently started Singulair and seems to do better since starting the Singulair.  She is having no active cough in the office today.  Although she has described coughing fits. Denies any sore throat. No hoarseness noted. 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 In Wynelle Link with steroids and regained some of her hearing but still has hearing loss on the left side and uses a hearing aid in the left ear only. She has used Flonase but uses this on a as needed basis.  Past Medical History:  Diagnosis Date  . Allergic rhinitis   . Apnea, sleep 06/28/2012   Previously used CPAP  . Asthma   . Atypical chest pain 01/14/2017  . Back pain   . Decreased visual acuity 08/05/2016  . Depression 06/19/2015  . Environmental and seasonal allergies   . GERD (gastroesophageal reflux disease)   . Heartburn   . Hip pain, right 06/28/2012  . HTN (hypertension)   . Hypokalemia 01/14/2017  . Leg pain   . Morbid obesity (HCC) 04/11/2008   Qualifier: Diagnosis of  By: [name]   . Numbness in feet 05/06/2016  . Obesity   . Osteoarthritis 01/16/2010   Qualifier: Diagnosis of  By: [name]   . Preventative health care 06/25/2015  . Shortness of breath   . Shortness of breath on exertion   . Sleep apnea   . Swelling of extremity   . Tinnitus   . Vertigo    Past Surgical History:  Procedure Laterality Date  . BIOPSY BREAST  1997   left   . childbirth    . COLONOSCOPY  2004  . UPPER GASTROINTESTINAL ENDOSCOPY  03/01/2019   Social History   Socioeconomic History  . Marital status: Divorced    Spouse name: Not on file  . Number of children: 1  . Years of  education: Not on file  . Highest education level: Not on file  Occupational History  . Occupation: HS music teacher- Location manager  Tobacco Use  . Smoking status: Never Smoker  . Smokeless tobacco: Never Used  Substance and Sexual Activity  . Alcohol use: Yes    Comment: social  . Drug use: No  . 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 Not on file  Social History Narrative   Single/divorced   1 son born 73 - sometimes there w/ her, leukemia 2016   Music teacher [employer]   Caffeine use 1 cup AM   Never smoker/tobacco   No drugs   Rare-occ EtOHSmoking    Social Determinants of Health   Financial Resource Strain:   . Difficulty of Paying Living Expenses:   Food Insecurity:   . Worried About Programme researcher, broadcasting/film/video in the Last Year:   . Barista in the Last Year:   Transportation Needs:   . Freight forwarder (Medical):   Marland Kitchen Lack of Transportation (Non-Medical):   Physical Activity:   . Days of Exercise per Week:   .  Minutes of Exercise per Session:   Stress:   . Feeling of Stress :   Social Connections:   . Frequency of Communication with Friends and Family:   . Frequency of Social Gatherings with Friends and Family:   . Attends Religious Services:   . Active Member of Clubs or Organizations:   . Attends Banker Meetings:   Marland Kitchen Marital Status:    Family History  Problem Relation Age of Onset  . Stroke Mother   . Liver disease Mother        hepatitis b  . Hypertension Mother   . Cancer Father        esophagus, prostate  . Hypertension Father   . Heart disease Father   . Esophageal cancer Father   . Liver disease Brother        hep c  . Mental illness Brother        PTSD from [place]  . Stroke Maternal Grandmother   . Cancer Paternal Grandfather        prostate  . Colitis Neg Hx   . Colon cancer Neg Hx   . Rectal cancer Neg Hx   . Stomach  cancer Neg Hx    Allergies  Allergen Reactions  . Sulfonamide Derivatives   . Sulfa Antibiotics Rash   Prior to Admission medications   Medication Sig Start Date End Date Taking? Authorizing Provider  albuterol (VENTOLIN HFA) 108 (90 Base) MCG/ACT inhaler Inhale 1-2 puffs into the lungs every 6 (six) hours as needed. 08/16/19   Bradd Canary, MD  amLODipine-olmesartan (AZOR) 10-40 MG tablet TAKE 1 TABLET BY MOUTH EVERY DAY 05/20/19   Bradd Canary, MD  DEXILANT 60 MG capsule TAKE 1 CAPSULE (60 MG TOTAL) BY MOUTH DAILY BEFORE BREAKFAST. 08/02/19   Iva Boop, MD  EPINEPHrine (EPIPEN 2-PAK) 0.3 mg/0.3 mL IJ SOAJ injection Inject 1 Dose into the muscle as needed for allergies. Inject after allergy shot for anaphylaxis    [provider]  famotidine (PEPCID) 40 MG tablet TAKE 1 TABLET BY MOUTH EVERY DAY 07/31/19   Bradd Canary, MD  fluticasone (FLONASE) 50 MCG/ACT nasal spray Place 1 spray into both nostrils daily as needed. 07/13/17   [provider]  fluticasone (FLOVENT HFA) 220 MCG/ACT inhaler Inhale 1 puff into the lungs in the morning and at bedtime. 08/16/19   Bradd Canary, MD  levocetirizine (XYZAL) 5 MG tablet Take 5 mg by mouth every evening.    [provider]  meloxicam (MOBIC) 15 MG tablet TAKE 1 TABLET BY MOUTH EVERY DAY AS NEEDED FOR PAIN 08/13/19   Bradd Canary, MD  montelukast (SINGULAIR) 10 MG tablet Take 1 tablet (10 mg total) by mouth at bedtime. 08/16/19   Bradd Canary, MD  oxybutynin (DITROPAN-XL) 10 MG 24 hr tablet Take 10 mg by mouth daily.     [provider]  tiZANidine (ZANAFLEX) 2 MG tablet TAKE 1/2 TO 2 TABLETS (1-4 MG TOTAL) BY MOUTH AT BEDTIME AS NEEDED FOR MUSCLE SPASMS. 07/12/19   Bradd Canary, MD  Vitamin D, Ergocalciferol, (DRISDOL) 1.25 MG (50000 UNIT) CAPS capsule Take 1 capsule (50,000 Units total) by mouth every 7 (seven) days. 08/16/19   Bradd Canary, MD     Positive ROS: Otherwise negative  All other  systems have been reviewed and were otherwise negative with the exception of those mentioned in the HPI and as above.  Physical Exam: Constitutional: Alert, well-appearing, no acute  distress Ears: External ears without lesions or tenderness.  Right ear canal with minimal wax which is nonobstructing and TM is clear.  Left ear canal is clear with a clear left TM.  On tuning fork testing with the 1024 tuning fork she has moderate hearing loss in the left ear and very minimal hearing loss in the right ear. Nasal: External nose without lesions. Septum slightly deviated to the left with moderate rhinitis and clear mucus discharge..  No polyps noted.  Both middle meatus regions are clear with no signs of infection. Oral: Lips and gums without lesions. Tongue and palate mucosa without lesions. Posterior oropharynx clear.  Tonsils small and symmetric. Fiberoptic laryngoscopy was performed through the right nostril.  Moderate edema with clear mucus discharge.  Nasopharynx was clear.  Base of tongue vallecula and epiglottis were normal.  Vocal cords were clear bilaterally with normal vocal cord mobility. Neck: No palpable adenopathy or masses Respiratory: Breathing comfortably.  Minimal wheezing on expiration. Skin: No facial/neck lesions or rash noted.  Laryngoscopy  Date/Time: 08/31/2019 4:51 PM Performed by: Rozetta Nunnery, MD Authorized by: Rozetta Nunnery, MD   Consent:    Consent obtained:  Verbal   Consent given by:  Patient Procedure details:    Indications: direct visualization of the upper aerodigestive tract     Medication:  Afrin   Instrument: flexible fiberoptic laryngoscope     Scope location: right nare   Sinus:    Right nasopharynx: normal   Mouth:    Vallecula: normal     Base of tongue: normal     Epiglottis: normal   Throat:    True vocal cords: normal   Comments:     Clear upper airway examination with normal vocal cords and normal vocal cord  mobility.    Assessment: History of chronic cough which has improved on Singulair. History of allergic rhinitis and asthma.  Plan: Reviewed with her concerning regular use of Flonase 2 sprays each nostril at night as this will provide better relief of nasal congestion and allergic rhinitis if used regularly. If coughing persists consider further evaluation with pulmonary.   Radene Journey, MD   CC:

## 2019-10-08 ENCOUNTER — Encounter: Payer: Self-pay | Admitting: Family Medicine

## 2019-10-09 ENCOUNTER — Other Ambulatory Visit: Payer: Self-pay | Admitting: Family Medicine

## 2019-11-07 ENCOUNTER — Other Ambulatory Visit: Payer: Self-pay | Admitting: Family Medicine

## 2019-11-07 DIAGNOSIS — J309 Allergic rhinitis, unspecified: Secondary | ICD-10-CM

## 2019-11-07 DIAGNOSIS — R059 Cough, unspecified: Secondary | ICD-10-CM

## 2019-11-18 ENCOUNTER — Ambulatory Visit: Payer: BC Managed Care – PPO | Admitting: Family Medicine

## 2019-12-04 ENCOUNTER — Other Ambulatory Visit: Payer: Self-pay | Admitting: Family Medicine

## 2019-12-04 DIAGNOSIS — J209 Acute bronchitis, unspecified: Secondary | ICD-10-CM

## 2019-12-12 ENCOUNTER — Other Ambulatory Visit: Payer: Self-pay | Admitting: Internal Medicine

## 2019-12-30 ENCOUNTER — Other Ambulatory Visit: Payer: Self-pay | Admitting: Family Medicine

## 2020-01-04 ENCOUNTER — Telehealth: Payer: BC Managed Care – PPO | Admitting: Family Medicine

## 2020-01-04 ENCOUNTER — Telehealth (INDEPENDENT_AMBULATORY_CARE_PROVIDER_SITE_OTHER): Payer: BC Managed Care – PPO | Admitting: Family Medicine

## 2020-01-04 ENCOUNTER — Other Ambulatory Visit: Payer: Self-pay

## 2020-01-04 ENCOUNTER — Encounter: Payer: Self-pay | Admitting: Family Medicine

## 2020-01-04 DIAGNOSIS — E559 Vitamin D deficiency, unspecified: Secondary | ICD-10-CM | POA: Diagnosis not present

## 2020-01-04 DIAGNOSIS — R739 Hyperglycemia, unspecified: Secondary | ICD-10-CM | POA: Diagnosis not present

## 2020-01-04 DIAGNOSIS — E669 Obesity, unspecified: Secondary | ICD-10-CM

## 2020-01-04 DIAGNOSIS — R609 Edema, unspecified: Secondary | ICD-10-CM

## 2020-01-04 DIAGNOSIS — I10 Essential (primary) hypertension: Secondary | ICD-10-CM

## 2020-01-04 DIAGNOSIS — E782 Mixed hyperlipidemia: Secondary | ICD-10-CM | POA: Diagnosis not present

## 2020-01-04 NOTE — Progress Notes (Signed)
Virtual Visit via Video Note  I connected with Terri [name] on 01/04/20 at 10:00 AM EDT by a video enabled telemedicine application and verified that I am speaking with the correct person using two identifiers.  Location: Patient: home, patient and provider in visit Provider: home   I discussed the limitations of evaluation and management by telemedicine and the availability of in person appointments. The patient expressed understanding and agreed to proceed. Thelma Barge, CMA was able to get the patient set up on a video visit    Subjective:    [name], female    DOB: [birth year], 69 y.o.   MRN: [id number]  Chief Complaint  Patient presents with  . Follow-up  . vitamin d    pt finished the rx strength but was never on the otc vit d    HPI Patient is in today for follow up on chronic medical concerns. No recent febrile illness or hospitalizations. She has now officially retired and is very happy about that fact. She has not started exercising but [facility] has [employer] through her insurance. She continues to have some trouble with swelling in her left foot. Compression hoses help some but the calf portion gets tight at times. Denies CP/palp/SOB/HA/congestion/fevers/GI or GU c/o. Taking meds as prescribed  Past Medical History:  Diagnosis Date  . Allergic rhinitis   . Apnea, sleep 06/28/2012   Previously used CPAP  . Asthma   . Atypical chest pain 01/14/2017  . Back pain   . Decreased visual acuity 08/05/2016  . Depression 06/19/2015  . Environmental and seasonal allergies   . GERD (gastroesophageal reflux disease)   . Heartburn   . Hip pain, right 06/28/2012  . HTN (hypertension)   . Hypokalemia 01/14/2017  . Leg pain   . Morbid obesity (HCC) 04/11/2008   Qualifier: Diagnosis of  By: [name]   . Numbness in feet 05/06/2016  . Obesity   . Osteoarthritis 01/16/2010   Qualifier: Diagnosis of  By: [name]   . Preventative health care 06/25/2015  . Shortness of breath   . Shortness of breath on exertion   . Sleep apnea   . Swelling of extremity   . Tinnitus   . Vertigo     Past Surgical History:  Procedure Laterality Date  . BIOPSY BREAST  1997   left   . childbirth    . COLONOSCOPY  2004  . UPPER GASTROINTESTINAL ENDOSCOPY  03/01/2019    Family History  Problem Relation Age of Onset  . Stroke Mother   . Liver disease Mother        hepatitis b  . Hypertension Mother   . Cancer Father        esophagus, prostate  . Hypertension Father   . Heart disease Father   . Esophageal cancer Father   . Liver disease Brother        hep c  . Mental illness Brother        PTSD from [place]  . Stroke Maternal Grandmother   . Cancer Paternal Grandfather        prostate  . Colitis Neg Hx   . Colon cancer Neg Hx   . Rectal cancer Neg Hx   . Stomach cancer Neg Hx     Social History   Socioeconomic History  . Marital status: Divorced    Spouse name: Not on file  .  Number of children: 1  . Years of education: Not on file  . Highest education level: Not on file  Occupational History  . Occupation: HS music teacher- Location manager  Tobacco Use  . Smoking status: Never Smoker  . Smokeless tobacco: Never Used  Vaping Use  . Vaping Use: Never used  Substance and Sexual Activity  . Alcohol use: Yes    Comment: social  . Drug use: No  . 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 Not on file  Social History Narrative   Single/divorced   1 son born 6 - sometimes there w/ her, leukemia 2016   Music teacher [employer]   Caffeine use 1 cup AM   Never smoker/tobacco   No drugs   Rare-occ EtOHSmoking    Social Determinants of Health   Financial Resource Strain:   . Difficulty of Paying Living Expenses:   Food Insecurity:   . Worried About Programme researcher, broadcasting/film/video in the Last Year:     . Barista in the Last Year:   Transportation Needs:   . Freight forwarder (Medical):   Marland Kitchen Lack of Transportation (Non-Medical):   Physical Activity:   . Days of Exercise per Week:   . Minutes of Exercise per Session:   Stress:   . Feeling of Stress :   Social Connections:   . Frequency of Communication with Friends and Family:   . Frequency of Social Gatherings with Friends and Family:   . Attends Religious Services:   . Active Member of Clubs or Organizations:   . Attends Banker Meetings:   Marland Kitchen Marital Status:   Intimate Partner Violence:   . Fear of Current or Ex-Partner:   . Emotionally Abused:   Marland Kitchen Physically Abused:   . Sexually Abused:     Outpatient Medications Prior to Visit  Medication Sig Dispense Refill  . albuterol (VENTOLIN HFA) 108 (90 Base) MCG/ACT inhaler Inhale 1-2 puffs into the lungs every 6 (six) hours as needed for wheezing or shortness of breath. 18 g 5  . amLODipine-olmesartan (AZOR) 10-40 MG tablet TAKE 1 TABLET BY MOUTH EVERY DAY 90 tablet 1  . DEXILANT 60 MG capsule TAKE 1 CAPSULE (60 MG TOTAL) BY MOUTH DAILY BEFORE BREAKFAST. 90 capsule 0  . EPINEPHrine (EPIPEN 2-PAK) 0.3 mg/0.3 mL IJ SOAJ injection Inject 1 Dose into the muscle as needed for allergies. Inject after allergy shot for anaphylaxis    . famotidine (PEPCID) 40 MG tablet TAKE 1 TABLET BY MOUTH EVERY DAY 90 tablet 1  . fluticasone (FLONASE) 50 MCG/ACT nasal spray Place 1 spray into both nostrils daily as needed.    . fluticasone (FLOVENT HFA) 220 MCG/ACT inhaler Inhale 1 puff into the lungs in the morning and at bedtime. 1 Inhaler 12  . levocetirizine (XYZAL) 5 MG tablet Take 5 mg by mouth every evening.    . meloxicam (MOBIC) 15 MG tablet TAKE 1 TABLET BY MOUTH EVERY DAY AS NEEDED FOR PAIN 30 tablet 1  . montelukast (SINGULAIR) 10 MG tablet TAKE 1 TABLET BY MOUTH EVERYDAY AT BEDTIME 90 tablet 1  . oxybutynin (DITROPAN-XL) 10 MG 24 hr tablet Take 10 mg by mouth daily.      . Vitamin D, Ergocalciferol, (DRISDOL) 1.25 MG (50000 UNIT) CAPS capsule Take 1 capsule (50,000 Units total) by mouth every 7 (seven) days. (Patient not taking:  Reported on 01/04/2020) 4 capsule 4  . tiZANidine (ZANAFLEX) 2 MG tablet TAKE 1/2 TO 2 TABLETS (1-4 MG TOTAL) BY MOUTH AT BEDTIME AS NEEDED FOR MUSCLE SPASMS. 180 tablet 1   No facility-administered medications prior to visit.    Allergies  Allergen Reactions  . Sulfonamide Derivatives   . Sulfa Antibiotics Rash    Review of Systems  Constitutional: Negative for fever and malaise/fatigue.  HENT: Negative for congestion.   Eyes: Negative for blurred vision.  Respiratory: Negative for shortness of breath.   Cardiovascular: Negative for chest pain, palpitations and leg swelling.  Gastrointestinal: Negative for abdominal pain, blood in stool and nausea.  Genitourinary: Negative for dysuria and frequency.  Musculoskeletal: Negative for falls.  Skin: Negative for rash.  Neurological: Negative for dizziness, loss of consciousness and headaches.  Endo/Heme/Allergies: Negative for environmental allergies.  Psychiatric/Behavioral: Negative for depression. The patient is not nervous/anxious.        Objective:    Physical Exam Constitutional:      Appearance: Normal appearance.  HENT:     Head: Normocephalic and atraumatic.     Right Ear: External ear normal.     Left Ear: External ear normal.     Nose: Nose normal.  Eyes:     General:        Right eye: No discharge.        Left eye: No discharge.  Pulmonary:     Effort: Pulmonary effort is normal.  Neurological:     Mental Status: She is alert and oriented to person, place, and time.  Psychiatric:        Behavior: Behavior normal.     There were no vitals taken for this visit. Wt Readings from Last 3 Encounters:  08/16/19 257 lb 3.2 oz (116.7 kg)  03/01/19 258 lb (117 kg)  02/16/19 258 lb 2 oz (117.1 kg)    Diabetic Foot Exam - Simple   No data filed     Lab  Results  Component Value Date   WBC 8.2 08/16/2019   HGB 13.4 08/16/2019   HCT 40.5 08/16/2019   PLT 327.0 08/16/2019   GLUCOSE 107 (H) 08/16/2019   CHOL 163 08/16/2019   TRIG 99.0 08/16/2019   HDL 47.80 08/16/2019   LDLCALC 95 08/16/2019   ALT 16 08/16/2019   AST 16 08/16/2019   NA 139 08/16/2019   K 4.3 08/16/2019   CL 106 08/16/2019   CREATININE 0.96 08/16/2019   BUN 13 08/16/2019   CO2 29 08/16/2019   TSH 2.36 08/16/2019   HGBA1C 5.3 08/16/2019    Lab Results  Component Value Date   TSH 2.36 08/16/2019   Lab Results  Component Value Date   WBC 8.2 08/16/2019   HGB 13.4 08/16/2019   HCT 40.5 08/16/2019   MCV 94.7 08/16/2019   PLT 327.0 08/16/2019   Lab Results  Component Value Date   NA 139 08/16/2019   K 4.3 08/16/2019   CO2 29 08/16/2019   GLUCOSE 107 (H) 08/16/2019   BUN 13 08/16/2019   CREATININE 0.96 08/16/2019   BILITOT 0.6 08/16/2019   ALKPHOS 80 08/16/2019   AST 16 08/16/2019   ALT 16 08/16/2019   PROT 7.0 08/16/2019   ALBUMIN 3.9 08/16/2019   CALCIUM 9.2 08/16/2019   ANIONGAP 7 03/19/2019   GFR 70.18 08/16/2019   Lab Results  Component Value Date   CHOL 163 08/16/2019   Lab Results  Component Value Date   HDL 47.80 08/16/2019  Lab Results  Component Value Date   LDLCALC 95 08/16/2019   Lab Results  Component Value Date   TRIG 99.0 08/16/2019   Lab Results  Component Value Date   CHOLHDL 3 08/16/2019   Lab Results  Component Value Date   HGBA1C 5.3 08/16/2019       Assessment & Plan:   Problem List Items Addressed This Visit    Hyperlipidemia, mixed    Tolerating statin, encouraged heart healthy diet, avoid trans fats, minimize simple carbs and saturated fats. Increase exercise as tolerated      Obesity    Encouraged DASH or MIND diet, decrease po intake and increase exercise as tolerated. Needs 7-8 hours of sleep nightly. Avoid trans fats, eat small, frequent meals every 4-5 hours with lean proteins, complex carbs and  healthy fats. Minimize simple carbs.       Essential hypertension    Monitor and report any concerns, no changes to meds. Encouraged heart healthy diet such as the DASH diet and exercise as tolerated.       EDEMA    Left foot still swells but her compression hose help a lot. She notes they squeeze her large calves so they get uncomfortable. Will try specialty made ones in [place]      Vitamin D deficiency    Supplement and monitor, she stopped the high dose tab a couple months will check levels      Hyperglycemia    hgba1c acceptable, minimize simple carbs. Increase exercise as tolerated.         I have discontinued Jace Dowe. Grondahl's tiZANidine. I am also having her maintain her oxybutynin, levocetirizine, fluticasone, EPINEPHrine, famotidine, Flovent HFA, Vitamin D (Ergocalciferol), meloxicam, montelukast, albuterol, Dexilant, and amLODipine-olmesartan.  No orders of the defined types were placed in this encounter.   I discussed the assessment and treatment plan with the patient. The patient was provided an opportunity to ask questions and all were answered. The patient agreed with the plan and demonstrated an understanding of the instructions.   The patient was advised to call back or seek an in-person evaluation if the symptoms worsen or if the condition fails to improve as anticipated.  I provided 20 minutes of non-face-to-face time during this encounter.   Danise Edge, MD

## 2020-01-04 NOTE — Assessment & Plan Note (Signed)
Left foot still swells but her compression hose help a lot. She notes they squeeze her large calves so they get uncomfortable. Will try specialty made ones in [facility]

## 2020-01-04 NOTE — Assessment & Plan Note (Addendum)
Supplement and monitor, she stopped the high dose tab a couple months will check levels

## 2020-01-04 NOTE — Assessment & Plan Note (Signed)
Monitor and report any concerns, no changes to meds. Encouraged heart healthy diet such as the DASH diet and exercise as tolerated.  ?

## 2020-01-04 NOTE — Assessment & Plan Note (Signed)
hgba1c acceptable, minimize simple carbs. Increase exercise as tolerated.  

## 2020-01-04 NOTE — Addendum Note (Signed)
Addended by: Thelma Barge D on: 01/04/2020 02:28 PM   Modules accepted: Orders

## 2020-01-04 NOTE — Assessment & Plan Note (Signed)
Encouraged DASH or MIND diet, decrease po intake and increase exercise as tolerated. Needs 7-8 hours of sleep nightly. Avoid trans fats, eat small, frequent meals every 4-5 hours with lean proteins, complex carbs and healthy fats. Minimize simple carbs 

## 2020-01-04 NOTE — Assessment & Plan Note (Signed)
Tolerating statin, encouraged heart healthy diet, avoid trans fats, minimize simple carbs and saturated fats. Increase exercise as tolerated 

## 2020-01-05 ENCOUNTER — Other Ambulatory Visit (INDEPENDENT_AMBULATORY_CARE_PROVIDER_SITE_OTHER): Payer: BC Managed Care – PPO

## 2020-01-05 DIAGNOSIS — E782 Mixed hyperlipidemia: Secondary | ICD-10-CM

## 2020-01-05 DIAGNOSIS — E559 Vitamin D deficiency, unspecified: Secondary | ICD-10-CM | POA: Diagnosis not present

## 2020-01-05 DIAGNOSIS — I10 Essential (primary) hypertension: Secondary | ICD-10-CM

## 2020-01-05 DIAGNOSIS — R739 Hyperglycemia, unspecified: Secondary | ICD-10-CM | POA: Diagnosis not present

## 2020-01-05 LAB — CBC
HCT: 38.4 % (ref 36.0–46.0)
Hemoglobin: 12.4 g/dL (ref 12.0–15.0)
MCHC: 32.1 g/dL (ref 30.0–36.0)
MCV: 92.8 fl (ref 78.0–100.0)
Platelets: 344 10*3/uL (ref 150.0–400.0)
RBC: 4.14 Mil/uL (ref 3.87–5.11)
RDW: 15.1 % (ref 11.5–15.5)
WBC: 9.3 10*3/uL (ref 4.0–10.5)

## 2020-01-05 LAB — COMPREHENSIVE METABOLIC PANEL
ALT: 12 U/L (ref 0–35)
AST: 14 U/L (ref 0–37)
Albumin: 3.8 g/dL (ref 3.5–5.2)
Alkaline Phosphatase: 73 U/L (ref 39–117)
BUN: 13 mg/dL (ref 6–23)
CO2: 29 mEq/L (ref 19–32)
Calcium: 9 mg/dL (ref 8.4–10.5)
Chloride: 107 mEq/L (ref 96–112)
Creatinine, Ser: 0.91 mg/dL (ref 0.40–1.20)
GFR: 74.56 mL/min (ref >60.00)
Glucose, Bld: 97 mg/dL (ref 70–99)
Potassium: 3.8 mEq/L (ref 3.5–5.1)
Sodium: 141 mEq/L (ref 135–145)
Total Bilirubin: 0.5 mg/dL (ref 0.2–1.2)
Total Protein: 6.9 g/dL (ref 6.0–8.3)

## 2020-01-05 LAB — LIPID PANEL
Cholesterol: 143 mg/dL (ref 0–200)
HDL: 44.5 mg/dL (ref >39.00)
LDL Cholesterol: 81 mg/dL (ref 0–99)
NonHDL: 98.68
Total CHOL/HDL Ratio: 3
Triglycerides: 89 mg/dL (ref 0.0–149.0)
VLDL: 17.8 mg/dL (ref 0.0–40.0)

## 2020-01-05 LAB — HEMOGLOBIN A1C: Hgb A1c MFr Bld: 5.5 % (ref 4.6–6.5)

## 2020-01-05 LAB — VITAMIN D 25 HYDROXY (VIT D DEFICIENCY, FRACTURES): VITD: 25.39 ng/mL — ABNORMAL LOW (ref 30.00–100.00)

## 2020-01-05 LAB — TSH: TSH: 3.02 u[IU]/mL (ref 0.35–4.50)

## 2020-01-07 ENCOUNTER — Other Ambulatory Visit: Payer: Self-pay | Admitting: *Deleted

## 2020-01-07 DIAGNOSIS — E559 Vitamin D deficiency, unspecified: Secondary | ICD-10-CM

## 2020-01-07 MED ORDER — VITAMIN D (ERGOCALCIFEROL) 1.25 MG (50000 UNIT) PO CAPS: 50000.0000 [IU] | ORAL_CAPSULE | ORAL | 4 refills | Status: DC

## 2020-02-01 ENCOUNTER — Other Ambulatory Visit: Payer: Self-pay | Admitting: Family Medicine

## 2020-02-11 ENCOUNTER — Other Ambulatory Visit: Payer: Self-pay

## 2020-02-11 DIAGNOSIS — Z20822 Contact with and (suspected) exposure to covid-19: Secondary | ICD-10-CM

## 2020-02-15 LAB — NOVEL CORONAVIRUS, NAA: SARS-CoV-2, NAA: NOT DETECTED

## 2020-03-15 ENCOUNTER — Other Ambulatory Visit: Payer: Self-pay

## 2020-03-15 DIAGNOSIS — Z20822 Contact with and (suspected) exposure to covid-19: Secondary | ICD-10-CM

## 2020-03-16 LAB — SARS-COV-2, NAA 2 DAY TAT

## 2020-03-16 LAB — NOVEL CORONAVIRUS, NAA: SARS-CoV-2, NAA: NOT DETECTED

## 2020-03-18 ENCOUNTER — Other Ambulatory Visit: Payer: Self-pay | Admitting: Internal Medicine

## 2020-03-26 ENCOUNTER — Other Ambulatory Visit: Payer: Self-pay | Admitting: Family Medicine

## 2020-03-26 DIAGNOSIS — E559 Vitamin D deficiency, unspecified: Secondary | ICD-10-CM

## 2020-05-22 ENCOUNTER — Other Ambulatory Visit: Payer: Self-pay | Admitting: Family Medicine

## 2020-05-22 DIAGNOSIS — J309 Allergic rhinitis, unspecified: Secondary | ICD-10-CM

## 2020-05-22 DIAGNOSIS — R059 Cough, unspecified: Secondary | ICD-10-CM

## 2020-05-25 ENCOUNTER — Other Ambulatory Visit: Payer: Medicare PPO

## 2020-05-25 DIAGNOSIS — Z20822 Contact with and (suspected) exposure to covid-19: Secondary | ICD-10-CM

## 2020-05-27 LAB — NOVEL CORONAVIRUS, NAA: SARS-CoV-2, NAA: NOT DETECTED

## 2020-05-27 LAB — SARS-COV-2, NAA 2 DAY TAT

## 2020-06-12 ENCOUNTER — Other Ambulatory Visit: Payer: Medicare PPO

## 2020-06-12 DIAGNOSIS — Z20822 Contact with and (suspected) exposure to covid-19: Secondary | ICD-10-CM

## 2020-06-13 ENCOUNTER — Other Ambulatory Visit: Payer: Self-pay | Admitting: Internal Medicine

## 2020-06-13 LAB — SARS-COV-2, NAA 2 DAY TAT

## 2020-06-13 LAB — NOVEL CORONAVIRUS, NAA: SARS-CoV-2, NAA: NOT DETECTED

## 2020-06-29 ENCOUNTER — Other Ambulatory Visit: Payer: Self-pay

## 2020-06-29 ENCOUNTER — Ambulatory Visit (INDEPENDENT_AMBULATORY_CARE_PROVIDER_SITE_OTHER): Payer: Medicare PPO | Admitting: Family Medicine

## 2020-06-29 ENCOUNTER — Encounter: Payer: Self-pay | Admitting: Family Medicine

## 2020-06-29 VITALS — BP 128/68 | HR 95 | Temp 98.1°F | Resp 18 | Ht 64.5 in | Wt 274.2 lb

## 2020-06-29 DIAGNOSIS — E559 Vitamin D deficiency, unspecified: Secondary | ICD-10-CM | POA: Diagnosis not present

## 2020-06-29 DIAGNOSIS — R739 Hyperglycemia, unspecified: Secondary | ICD-10-CM | POA: Diagnosis not present

## 2020-06-29 DIAGNOSIS — E782 Mixed hyperlipidemia: Secondary | ICD-10-CM

## 2020-06-29 DIAGNOSIS — K449 Diaphragmatic hernia without obstruction or gangrene: Secondary | ICD-10-CM

## 2020-06-29 DIAGNOSIS — Z0000 Encounter for general adult medical examination without abnormal findings: Secondary | ICD-10-CM | POA: Diagnosis not present

## 2020-06-29 DIAGNOSIS — I10 Essential (primary) hypertension: Secondary | ICD-10-CM

## 2020-06-29 DIAGNOSIS — G4730 Sleep apnea, unspecified: Secondary | ICD-10-CM

## 2020-06-29 DIAGNOSIS — M25521 Pain in right elbow: Secondary | ICD-10-CM

## 2020-06-29 DIAGNOSIS — E669 Obesity, unspecified: Secondary | ICD-10-CM

## 2020-06-29 DIAGNOSIS — G4733 Obstructive sleep apnea (adult) (pediatric): Secondary | ICD-10-CM

## 2020-06-29 DIAGNOSIS — K219 Gastro-esophageal reflux disease without esophagitis: Secondary | ICD-10-CM

## 2020-06-29 LAB — CBC WITH DIFFERENTIAL/PLATELET
Basophils Absolute: 0.1 10*3/uL (ref 0.0–0.1)
Basophils Relative: 0.9 % (ref 0.0–3.0)
Eosinophils Absolute: 0.8 10*3/uL — ABNORMAL HIGH (ref 0.0–0.7)
Eosinophils Relative: 8.5 % — ABNORMAL HIGH (ref 0.0–5.0)
HCT: 36.4 % (ref 36.0–46.0)
Hemoglobin: 11.6 g/dL — ABNORMAL LOW (ref 12.0–15.0)
Lymphocytes Relative: 27.1 % (ref 12.0–46.0)
Lymphs Abs: 2.7 10*3/uL (ref 0.7–4.0)
MCHC: 31.8 g/dL (ref 30.0–36.0)
MCV: 91.2 fl (ref 78.0–100.0)
Monocytes Absolute: 1 10*3/uL (ref 0.1–1.0)
Monocytes Relative: 10.2 % (ref 3.0–12.0)
Neutro Abs: 5.3 10*3/uL (ref 1.4–7.7)
Neutrophils Relative %: 53.3 % (ref 43.0–77.0)
Platelets: 417 10*3/uL — ABNORMAL HIGH (ref 150.0–400.0)
RBC: 3.99 Mil/uL (ref 3.87–5.11)
RDW: 14.7 % (ref 11.5–15.5)
WBC: 9.9 10*3/uL (ref 4.0–10.5)

## 2020-06-29 LAB — COMPREHENSIVE METABOLIC PANEL
ALT: 14 U/L (ref 0–35)
AST: 12 U/L (ref 0–37)
Albumin: 3.8 g/dL (ref 3.5–5.2)
Alkaline Phosphatase: 69 U/L (ref 39–117)
BUN: 15 mg/dL (ref 6–23)
CO2: 30 mEq/L (ref 19–32)
Calcium: 9.4 mg/dL (ref 8.4–10.5)
Chloride: 107 mEq/L (ref 96–112)
Creatinine, Ser: 0.95 mg/dL (ref 0.40–1.20)
GFR: 61.91 mL/min (ref >60.00)
Glucose, Bld: 89 mg/dL (ref 70–99)
Potassium: 4.4 mEq/L (ref 3.5–5.1)
Sodium: 142 mEq/L (ref 135–145)
Total Bilirubin: 0.6 mg/dL (ref 0.2–1.2)
Total Protein: 6.9 g/dL (ref 6.0–8.3)

## 2020-06-29 LAB — LIPID PANEL
Cholesterol: 153 mg/dL (ref 0–200)
HDL: 47 mg/dL (ref >39.00)
LDL Cholesterol: 89 mg/dL (ref 0–99)
NonHDL: 105.88
Total CHOL/HDL Ratio: 3
Triglycerides: 86 mg/dL (ref 0.0–149.0)
VLDL: 17.2 mg/dL (ref 0.0–40.0)

## 2020-06-29 LAB — TSH: TSH: 3.82 u[IU]/mL (ref 0.35–4.50)

## 2020-06-29 LAB — HEMOGLOBIN A1C: Hgb A1c MFr Bld: 5.5 % (ref 4.6–6.5)

## 2020-06-29 LAB — VITAMIN D 25 HYDROXY (VIT D DEFICIENCY, FRACTURES): VITD: 39.3 ng/mL (ref 30.00–100.00)

## 2020-06-29 NOTE — Assessment & Plan Note (Signed)
Small HH, Avoid offending foods, start probiotics. Do not eat large meals in late evening and consider raising head of bed.

## 2020-06-29 NOTE — Patient Instructions (Addendum)
Try CoverMyMeds or Goodrx  Tennis Elbow  Tennis elbow (lateral epicondylitis) is inflammation of tendons in your outer forearm, near your elbow. Tendons are tissues that connect muscle to bone. When you have tennis elbow, inflammation affects the tendons that you use to bend your wrist and move your hand up. Inflammation occurs in the lower part of the upper arm bone (humerus), where the tendons connect to the bone (lateral epicondyle). Tennis elbow often affects people who play tennis, but anyone may get the condition from repeatedly extending the wrist or turning the forearm. What are the causes? This condition is usually caused by repeatedly extending the wrist, turning the forearm, and using the hands. It can result from sports or work that requires repetitive forearm movements. In some cases, it may be caused by a sudden injury. What increases the risk? You are more likely to develop tennis elbow if you play tennis or another racket sport. You also have a higher risk if you frequently use your hands for work. Besides people who play tennis, others at greater risk include:  People who use computers.  Architect workers. People who work in [employer]. Preventive Care 42 Years and Older, Female Preventive care refers to lifestyle choices and visits with your health care provider that can promote health and wellness. This includes:  A yearly physical exam. This is also called an annual wellness visit.  Regular dental and eye exams.  Immunizations.  Screening for certain conditions.  Healthy lifestyle choices, such as: ? Eating a healthy diet. ? Getting regular exercise. ? Not using drugs or products that contain nicotine and tobacco. ? Limiting alcohol use. What can I expect for my preventive care visit? Physical exam Your health care provider will check your:  Height and weight. These may be used to calculate your BMI (body mass index). BMI is a measurement that tells if you are  at a healthy weight.  Heart rate and blood pressure.  Body temperature.  Skin for abnormal spots. Counseling Your health care provider may ask you questions about your:  Past medical problems.  Family's medical history.  Alcohol, tobacco, and drug use.  Emotional well-being.  Home life and relationship well-being.  Sexual activity.  Diet, exercise, and sleep habits.  History of falls.  Memory and ability to understand (cognition).  Work and work Statistician.  Pregnancy and menstrual history.  Access to firearms. What immunizations do I need? Vaccines are usually given at various ages, according to a schedule. Your health care provider will recommend vaccines for you based on your age, medical history, and lifestyle or other factors, such as travel or where you work.   What tests do I need? Blood tests  Lipid and cholesterol levels. These may be checked every 5 years, or more often depending on your overall health.  Hepatitis C test.  Hepatitis B test. Screening  Lung cancer screening. You may have this screening every year starting at age 22 if you have a 30-pack-year history of smoking and currently smoke or have quit within the past 15 years.  Colorectal cancer screening. ? All adults should have this screening starting at age 25 and continuing until age 33. ? Your health care provider may recommend screening at age 44 if you are at increased risk. ? You will have tests every 1-10 years, depending on your results and the type of screening test.  Diabetes screening. ? This is done by checking your blood sugar (glucose) after you have not eaten for a  while (fasting). ? You may have this done every 1-3 years.  Mammogram. ? This may be done every 1-2 years. ? Talk with your health care provider about how often you should have regular mammograms.  Abdominal aortic aneurysm (AAA) screening. You may need this if you are a current or former  smoker.  BRCA-related cancer screening. This may be done if you have a family history of breast, ovarian, tubal, or peritoneal cancers. Other tests  STD (sexually transmitted disease) testing, if you are at risk.  Bone density scan. This is done to screen for osteoporosis. You may have this done starting at age 54. Talk with your health care provider about your test results, treatment options, and if necessary, the need for more tests. Follow these instructions at home: Eating and drinking  Eat a diet that includes fresh fruits and vegetables, whole grains, lean protein, and low-fat dairy products. Limit your intake of foods with high amounts of sugar, saturated fats, and salt.  Take vitamin and mineral supplements as recommended by your health care provider.  Do not drink alcohol if your health care provider tells you not to drink.  If you drink alcohol: ? Limit how much you have to 0-1 drink a day. ? Be aware of how much alcohol is in your drink. In the [place] one drink equals one 12 oz bottle of beer (355 mL), one 5 oz glass of wine (148 mL), or one 1 oz glass of hard liquor (44 mL).   Lifestyle  Take daily care of your teeth and gums. Brush your teeth every morning and night with fluoride toothpaste. Floss one time each day.  Stay active. Exercise for at least 30 minutes 5 or more days each week.  Do not use any products that contain nicotine or tobacco, such as cigarettes, e-cigarettes, and chewing tobacco. If you need help quitting, ask your health care provider.  Do not use drugs.  If you are sexually active, practice safe sex. Use a condom or other form of protection in order to prevent STIs (sexually transmitted infections).  Talk with your health care provider about taking a low-dose aspirin or statin.  Find healthy ways to cope with stress, such as: ? Meditation, yoga, or listening to music. ? Journaling. ? Talking to a trusted person. ? Spending time with friends  and family. Safety  Always wear your seat belt while driving or riding in a vehicle.  Do not drive: ? If you have been drinking alcohol. Do not ride with someone who has been drinking. ? When you are tired or distracted. ? While texting.  Wear a helmet and other protective equipment during sports activities.  If you have firearms in your house, make sure you follow all gun safety procedures. What's next?  Visit your health care provider once a year for an annual wellness visit.  Ask your health care provider how often you should have your eyes and teeth checked.  Stay up to date on all vaccines. This information is not intended to replace advice given to you by your health care provider. Make sure you discuss any questions you have with your health care provider. Document Revised: 05/10/2020 Document Reviewed: 05/14/2018 Elsevier Patient Education  2021 [place].  [place].  [place]. What are the signs or symptoms? Symptoms of this condition include:  Pain and tenderness in the forearm and the outer part of the elbow. Pain may be felt only when using the arm, or it  may be there all the time.  A burning feeling that starts in the elbow and spreads down the forearm.  A weak grip in the hand. How is this diagnosed? This condition is diagnosed based on your symptoms, your medical history, and a physical exam. You may also have X-rays or an MRI to:  Confirm the diagnosis.  Look for other issues.  Check for tears in the ligaments, muscles, or tendons. How is this treated? Resting and icing your arm is often the first treatment. Your health care provider may also recommend:  Medicines to reduce pain and inflammation. These may be in the form of a pill, topical gels, or shots of a steroid medicine (cortisone).  An elbow strap to reduce stress on the area.  Physical therapy. This may include massage or exercises or both.  An elbow brace to restrict the  movements that cause symptoms. If these treatments do not help relieve your symptoms, your health care provider may recommend surgery to remove damaged muscle and reattach healthy muscle to bone. Follow these instructions at home: If you have a brace or strap:  Wear the brace or strap as told by your health care provider. Remove it only as told by your health care provider.  Check the skin around the brace or strap every day. Tell your health care provider about any concerns.  Loosen the brace if your fingers tingle, become numb, or turn cold and blue.  Keep the brace clean.  If the brace or strap is not waterproof: ? Do not let it get wet. ? Cover it with a watertight covering when you take a bath or a shower. Managing pain, stiffness, and swelling  If directed, put ice on the injured area. To do this: ? If you have a removable brace or strap, remove it as told by your health care provider. ? Put ice in a plastic bag. ? Place a towel between your skin and the bag. ? Leave the ice on for 20 minutes, 2-3 times a day. ? Remove the ice if your skin turns bright red. This is very important. If you cannot feel pain, heat, or cold, you have a greater risk of damage to the area.  Move your fingers often to reduce stiffness and swelling.   Activity  Rest your elbow and wrist and avoid activities that cause symptoms as told by your health care provider.  Do physical therapy exercises as told by your health care provider.  If you lift an object, lift it with your palm facing up. This reduces stress on your elbow. Lifestyle  If your tennis elbow is caused by sports, check your equipment and make sure that: ? You use it correctly. ? It is good match for you.  If your tennis elbow is caused by work or computer use, take frequent breaks to stretch your arm. Talk with your employer about ways to manage your condition at work. General instructions  Take over-the-counter and prescription  medicines only as told by your health care provider.  Do not use any products that contain nicotine or tobacco. These products include cigarettes, chewing tobacco, and vaping devices, such as e-cigarettes. If you need help quitting, ask your health care provider.  Keep all follow-up visits. This is important. How is this prevented?  Before and after activity: ? Warm up and stretch before being active. ? Cool down and stretch after being active. ? Give your body time to rest between periods of activity.  During activity: ?  Make sure to use equipment that fits you. ? If you play tennis, put power in your stroke with your lower body. Avoid using your arm only.  Maintain physical fitness, including: ? Strength. ? Flexibility. ? Endurance.  Do exercises to strengthen the forearm muscles. Contact a health care provider if:  You have pain that gets worse or does not get better with treatment.  You have numbness or weakness in your forearm, hand, or fingers. Get help right away if:  Your pain is severe.  You cannot move your wrist. Summary  Tennis elbow (lateral epicondylitis) is inflammation of tendons in your outer forearm, near your elbow.  Common symptoms include pain and tenderness in your forearm and the outer part of your elbow.  This condition is usually caused by repeatedly extending your wrist, turning your forearm, and using your hands.  The first treatment is often resting and icing your arm to relieve symptoms. Further treatment may include taking medicine, getting physical therapy, wearing a brace or strap, or having surgery. This information is not intended to replace advice given to you by your health care provider. Make sure you discuss any questions you have with your health care provider. Document Revised: 11/30/2019 Document Reviewed: 11/30/2019 Elsevier Patient Education  [place].

## 2020-06-29 NOTE — Assessment & Plan Note (Addendum)
Patient encouraged to maintain heart healthy diet, regular exercise, adequate sleep. Consider daily probiotics. Take medications as prescribed. Normal colonoscopy in 2020 she has been told she does not eat another one. Dexa scan in 2019 will repeat next year. MGM is scheduled next week. Labs ordered and reviewed

## 2020-06-29 NOTE — Assessment & Plan Note (Signed)
Well controlled, no changes to meds. Encouraged heart healthy diet such as the DASH diet and exercise as tolerated.  °

## 2020-06-29 NOTE — Assessment & Plan Note (Signed)
Encouraged heart healthy diet, increase exercise, avoid trans fats, consider a krill oil cap daily 

## 2020-06-29 NOTE — Assessment & Plan Note (Signed)
hgba1c acceptable, minimize simple carbs. Increase exercise as tolerated.  

## 2020-06-29 NOTE — Assessment & Plan Note (Signed)
.  sbs 

## 2020-06-29 NOTE — Assessment & Plan Note (Signed)
Is not using CPAP will refer back to pulmonology for new sleep study

## 2020-06-29 NOTE — Assessment & Plan Note (Signed)
Encouraged DASH and MIND diet, she is considering nutrisystem, decrease po intake and increase exercise as tolerated. Needs 7-8 hours of sleep nightly. Avoid trans fats, eat small, frequent meals every 4-5 hours with lean proteins, complex carbs and healthy fats. Minimize simple carbs, trued Healthy Weight and Wellness but that was while she was still working was she never could commit. She is considering [employer] as she does not like to cook.

## 2020-06-30 NOTE — Progress Notes (Signed)
Subjective:    [name], female    DOB: November 07, 1950, 70 y.o.   MRN: [id number]  Chief Complaint  Patient presents with  . Annual Exam    HPI Patient is in today for annual preventative exam and follow up on chronic medical concerns. No recent febrile illness or hospitalizations. She is enjoying retirement but she acknowledges that she has been staying in too much and not moving or exercising. She does not like to cook so she eats out all of the time. She continues to struggle with an irritated cough and is following with an allergist. Dr Madie Reno for allergy shots. This has helped her congestion and other allergic symptoms but cough is still present. Denies CP/palp/SOB/HA/congestion/fevers/GI or GU c/o. Taking meds as prescribed. No polyuria or polydipsia.   Past Medical History:  Diagnosis Date  . Allergic rhinitis   . Apnea, sleep 06/28/2012   Previously used CPAP  . Asthma   . Atypical chest pain 01/14/2017  . Back pain   . Decreased visual acuity 08/05/2016  . Depression 06/19/2015  . Environmental and seasonal allergies   . GERD (gastroesophageal reflux disease)   . Heartburn   . Hip pain, right 06/28/2012  . HTN (hypertension)   . Hypokalemia 01/14/2017  . Leg pain   . Morbid obesity (HCC) 04/11/2008   Qualifier: Diagnosis of  By: [name]   . Numbness in feet 05/06/2016  . Obesity   . Osteoarthritis 01/16/2010   Qualifier: Diagnosis of  By: [name]   . Preventative health care 06/25/2015  . Shortness of breath   . Shortness of breath on exertion   . Sleep apnea   . Swelling of extremity   . Tinnitus   . Vertigo     Past Surgical History:  Procedure Laterality Date  . BIOPSY BREAST  1997   left   . childbirth    . COLONOSCOPY  2004  . UPPER GASTROINTESTINAL ENDOSCOPY  03/01/2019    Family History  Problem Relation Age of Onset  . Stroke Mother   . Liver disease Mother        hepatitis b  . Hypertension Mother   . Cancer  Father        esophagus, prostate  . Hypertension Father   . Heart disease Father   . Esophageal cancer Father   . Liver disease Brother        hep c  . Mental illness Brother        PTSD from [place]  . Stroke Maternal Grandmother   . Cancer Paternal Grandfather        prostate  . Colitis Neg Hx   . Colon cancer Neg Hx   . Rectal cancer Neg Hx   . Stomach cancer Neg Hx     Social History   Socioeconomic History  . Marital status: Divorced    Spouse name: Not on file  . Number of children: 1  . Years of education: Not on file  . Highest education level: Not on file  Occupational History  . Occupation: HS music teacher- Location manager  Tobacco Use  . Smoking status: Never Smoker  . Smokeless tobacco: Never Used  Vaping Use  . Vaping Use: Never used  Substance and Sexual Activity  . Alcohol use: Yes    Comment: social  . Drug use: No  . Sexual activity: Not on file    Comment: band teacher, lives alone and  with son. no dietary restrictions  Other Topics Concern  . Not on file  Social History Narrative   Single/divorced   1 son born 72 - sometimes there w/ her, leukemia 2016   Music teacher [employer]   Caffeine use 1 cup AM   Never smoker/tobacco   No drugs   Rare-occ EtOHSmoking    Social Determinants of Health   Financial Resource Strain: Not on file  Food Insecurity: Not on file  Transportation Needs: Not on file  Physical Activity: Not on file  Stress: Not on file  Social Connections: Not on file  Intimate Partner Violence: Not on file    Outpatient Medications Prior to Visit  Medication Sig Dispense Refill  . albuterol (VENTOLIN HFA) 108 (90 Base) MCG/ACT inhaler Inhale 1-2 puffs into the lungs every 6 (six) hours as needed for wheezing or shortness of breath. 18 g 5  . amLODipine-olmesartan (AZOR) 10-40 MG tablet TAKE 1 TABLET BY MOUTH EVERY DAY 90 tablet 1  . DEXILANT 60 MG capsule TAKE 1 CAPSULE (60 MG TOTAL) BY MOUTH DAILY BEFORE  BREAKFAST. 90 capsule 0  . EPINEPHrine 0.3 mg/0.3 mL IJ SOAJ injection Inject 1 Dose into the muscle as needed for allergies. Inject after allergy shot for anaphylaxis    . famotidine (PEPCID) 40 MG tablet Take 1 tablet (40 mg total) by mouth daily. 90 tablet 3  . fluticasone (FLONASE) 50 MCG/ACT nasal spray Place 1 spray into both nostrils daily as needed.    . fluticasone (FLOVENT HFA) 220 MCG/ACT inhaler Inhale 1 puff into the lungs in the morning and at bedtime. 1 Inhaler 12  . levocetirizine (XYZAL) 5 MG tablet Take 5 mg by mouth every evening.    . meloxicam (MOBIC) 15 MG tablet TAKE 1 TABLET BY MOUTH EVERY DAY AS NEEDED FOR PAIN 30 tablet 1  . montelukast (SINGULAIR) 10 MG tablet TAKE 1 TABLET BY MOUTH EVERYDAY AT BEDTIME 90 tablet 1  . oxybutynin (DITROPAN-XL) 10 MG 24 hr tablet Take 10 mg by mouth daily.     . Vitamin D, Ergocalciferol, (DRISDOL) 1.25 MG (50000 UNIT) CAPS capsule TAKE 1 CAPSULE (50,000 UNITS TOTAL) BY MOUTH EVERY 7 (SEVEN) DAYS. FOR 12 WEEKS 12 capsule 1   No facility-administered medications prior to visit.    Allergies  Allergen Reactions  . Sulfonamide Derivatives   . Sulfa Antibiotics Rash    Review of Systems  Constitutional: Negative for chills, fever and malaise/fatigue.  HENT: Negative for congestion and hearing loss.   Eyes: Negative for discharge.  Respiratory: Positive for cough. Negative for sputum production and shortness of breath.   Cardiovascular: Negative for chest pain, palpitations and leg swelling.  Gastrointestinal: Negative for abdominal pain, blood in stool, constipation, diarrhea, heartburn, nausea and vomiting.  Genitourinary: Negative for dysuria, frequency, hematuria and urgency.  Musculoskeletal: Positive for joint pain. Negative for back pain, falls and myalgias.  Skin: Negative for rash.  Neurological: Negative for dizziness, sensory change, loss of consciousness, weakness and headaches.  Endo/Heme/Allergies: Negative for  environmental allergies. Does not bruise/bleed easily.  Psychiatric/Behavioral: Negative for depression and suicidal ideas. The patient is not nervous/anxious and does not have insomnia.        Objective:    Physical Exam Constitutional:      General: She is not in acute distress.    Appearance: She is not diaphoretic.  HENT:     Head: Normocephalic and atraumatic.     Right Ear: External ear normal.  Left Ear: External ear normal.     Nose: Nose normal.     Mouth/Throat:     Mouth: Oropharynx is clear and moist.     Pharynx: No oropharyngeal exudate.  Eyes:     General: No scleral icterus.       Right eye: No discharge.        Left eye: No discharge.     Conjunctiva/sclera: Conjunctivae normal.     Pupils: Pupils are equal, round, and reactive to light.  Neck:     Thyroid: No thyromegaly.  Cardiovascular:     Rate and Rhythm: Normal rate and regular rhythm.     Pulses: Intact distal pulses.     Heart sounds: Normal heart sounds. No murmur heard.   Pulmonary:     Effort: Pulmonary effort is normal. No respiratory distress.     Breath sounds: Normal breath sounds. No wheezing or rales.  Abdominal:     General: Bowel sounds are normal. There is no distension.     Palpations: Abdomen is soft. There is no mass.     Tenderness: There is no abdominal tenderness.  Musculoskeletal:        General: No tenderness or edema. Normal range of motion.     Cervical back: Normal range of motion and neck supple.  Lymphadenopathy:     Cervical: No cervical adenopathy.  Skin:    General: Skin is warm and dry.     Findings: No rash.  Neurological:     Mental Status: She is alert and oriented to person, place, and time.     Cranial Nerves: No cranial nerve deficit.     Coordination: Coordination normal.     Deep Tendon Reflexes: Reflexes are normal and symmetric. Reflexes normal.     BP 128/68   Pulse 95   Temp 98.1 F (36.7 C) (Oral)   Resp 18   Ht 5' 4.5" (1.638 m)   Wt  274 lb 3.2 oz (124.4 kg)   SpO2 98%   BMI 46.34 kg/m  Wt Readings from Last 3 Encounters:  06/29/20 274 lb 3.2 oz (124.4 kg)  08/16/19 257 lb 3.2 oz (116.7 kg)  03/01/19 258 lb (117 kg)    Diabetic Foot Exam - Simple   No data filed    Lab Results  Component Value Date   WBC 9.9 06/29/2020   HGB 11.6 (L) 06/29/2020   HCT 36.4 06/29/2020   PLT 417.0 (H) 06/29/2020   GLUCOSE 89 06/29/2020   CHOL 153 06/29/2020   TRIG 86.0 06/29/2020   HDL 47.00 06/29/2020   LDLCALC 89 06/29/2020   ALT 14 06/29/2020   AST 12 06/29/2020   NA 142 06/29/2020   K 4.4 06/29/2020   CL 107 06/29/2020   CREATININE 0.95 06/29/2020   BUN 15 06/29/2020   CO2 30 06/29/2020   TSH 3.82 06/29/2020   HGBA1C 5.5 06/29/2020    Lab Results  Component Value Date   TSH 3.82 06/29/2020   Lab Results  Component Value Date   WBC 9.9 06/29/2020   HGB 11.6 (L) 06/29/2020   HCT 36.4 06/29/2020   MCV 91.2 06/29/2020   PLT 417.0 (H) 06/29/2020   Lab Results  Component Value Date   NA 142 06/29/2020   K 4.4 06/29/2020   CO2 30 06/29/2020   GLUCOSE 89 06/29/2020   BUN 15 06/29/2020   CREATININE 0.95 06/29/2020   BILITOT 0.6 06/29/2020   ALKPHOS 69 06/29/2020   AST 12 06/29/2020  ALT 14 06/29/2020   PROT 6.9 06/29/2020   ALBUMIN 3.8 06/29/2020   CALCIUM 9.4 06/29/2020   ANIONGAP 7 03/19/2019   GFR 61.91 06/29/2020   Lab Results  Component Value Date   CHOL 153 06/29/2020   Lab Results  Component Value Date   HDL 47.00 06/29/2020   Lab Results  Component Value Date   LDLCALC 89 06/29/2020   Lab Results  Component Value Date   TRIG 86.0 06/29/2020   Lab Results  Component Value Date   CHOLHDL 3 06/29/2020   Lab Results  Component Value Date   HGBA1C 5.5 06/29/2020       Assessment & Plan:   Problem List Items Addressed This Visit    Hyperlipidemia, mixed    Encouraged heart healthy diet, increase exercise, avoid trans fats, consider a krill oil cap daily      Relevant  Orders   Lipid panel (Completed)   Obesity    Encouraged DASH and MIND diet, she is considering nutrisystem, decrease po intake and increase exercise as tolerated. Needs 7-8 hours of sleep nightly. Avoid trans fats, eat small, frequent meals every 4-5 hours with lean proteins, complex carbs and healthy fats. Minimize simple carbs, trued Healthy Weight and Wellness but that was while she was still working was she never could commit. She is considering [employer] as she does not like to cook.       Essential hypertension    Well controlled, no changes to meds. Encouraged heart healthy diet such as the DASH diet and exercise as tolerated.       Relevant Orders   CBC with Differential/Platelet (Completed)   TSH (Completed)   Gastroesophageal reflux disease with hiatal hernia    Small HH, Avoid offending foods, start probiotics. Do not eat large meals in late evening and consider raising head of bed.       Apnea, sleep    Is not using CPAP will refer back to pulmonology for new sleep study      Relevant Orders   Ambulatory referral to Pulmonology   Right elbow pain - Primary    Likely over use. Use ice and topical creams and referred to sports medicine for further evaluation.       Relevant Orders   Ambulatory referral to Sports Medicine   Vitamin D deficiency    .sbs      Relevant Orders   VITAMIN D 25 Hydroxy (Vit-D Deficiency, Fractures) (Completed)   Hyperglycemia    hgba1c acceptable, minimize simple carbs. Increase exercise as tolerated.      Relevant Orders   Hemoglobin A1c (Completed)   Comprehensive metabolic panel (Completed)   Encounter for preventative adult health care examination    Patient encouraged to maintain heart healthy diet, regular exercise, adequate sleep. Consider daily probiotics. Take medications as prescribed. Normal colonoscopy in 2020 she has been told she does not eat another one. Dexa scan in 2019 will repeat next year. MGM is scheduled next  week. Labs ordered and reviewed         I am having [name] maintain her oxybutynin, levocetirizine, fluticasone, EPINEPHrine, Flovent HFA, meloxicam, albuterol, amLODipine-olmesartan, famotidine, Vitamin D (Ergocalciferol), montelukast, and Dexilant.  No orders of the defined types were placed in this encounter.    Danise Edge, MD

## 2020-06-30 NOTE — Assessment & Plan Note (Signed)
Likely over use. Use ice and topical creams and referred to sports medicine for further evaluation.

## 2020-07-03 NOTE — Progress Notes (Signed)
   Subjective:   I, Philbert Riser, LAT, ATC acting as a scribe for [name], MD.  I'm seeing this patient as a consultation for Dr. Danise Edge. Note will be routed back to referring provider/PCP.  CC: Right elbow pain  HPI: Pt is a 70 y/o female c/o R elbow pain ongoing for 5-6 month w/ no known MOI.  Pt locates pain to lateral epicondyle area.  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  She has not been very active over the last few months and cannot think of a particular reason why her elbow is hurting her.  She is right-hand dominant.  Radiating: no Numbness/tingling: no Aggravates: supination, sleeping on it wrong Rx tried: creams  Past medical history, Surgical history, Family history, Social history, Allergies, and medications have been entered into the medical record, reviewed.   Review of Systems: No new headache, visual changes, nausea, vomiting, diarrhea, constipation, dizziness, abdominal pain, skin rash, fevers, chills, night sweats, weight loss, swollen lymph nodes, body aches, joint swelling, muscle aches, chest pain, shortness of breath, mood changes, visual or auditory hallucinations.   Objective:    Vitals:   07/04/20 1011  BP: 130/76  Pulse: 75  SpO2: 97%   General: Well Developed, well nourished, and in no acute distress.  Neuro/Psych: Alert and oriented x3, extra-ocular muscles intact, able to move all 4 extremities, sensation grossly intact. Skin: Warm and dry, no rashes noted.  Respiratory: Not using accessory muscles, speaking in full sentences, trachea midline.  Cardiovascular: Pulses palpable, no extremity edema. Abdomen: Does not appear distended. MSK: Right elbow normal-appearing Tender palpation lateral epicondyle.  Pain with resisted wrist extension.  Elbow motion and strength are intact. Pulses capillary refill and sensation are intact distally.   Impression and Recommendations:    Assessment and Plan: 70 y.o.  female with lateral epicondylitis of the right elbow. Plan for home exercise program.  Additionally recommend counterforce brace and Voltaren gel.  If not improving will either proceed with formal physical therapy or perhaps injection.  Check back in about a month.    Discussed warning signs or symptoms. Please see discharge instructions. Patient expresses understanding.   The above documentation has been reviewed and is accurate and complete [name], M.D.

## 2020-07-04 ENCOUNTER — Other Ambulatory Visit: Payer: Self-pay | Admitting: Family Medicine

## 2020-07-04 ENCOUNTER — Ambulatory Visit (INDEPENDENT_AMBULATORY_CARE_PROVIDER_SITE_OTHER): Payer: Medicare PPO | Admitting: Family Medicine

## 2020-07-04 ENCOUNTER — Other Ambulatory Visit: Payer: Self-pay

## 2020-07-04 ENCOUNTER — Ambulatory Visit: Payer: Self-pay

## 2020-07-04 VITALS — BP 130/76 | HR 75 | Ht 64.5 in | Wt 274.8 lb

## 2020-07-04 DIAGNOSIS — M25521 Pain in right elbow: Secondary | ICD-10-CM | POA: Diagnosis not present

## 2020-07-04 DIAGNOSIS — M7711 Lateral epicondylitis, right elbow: Secondary | ICD-10-CM | POA: Diagnosis not present

## 2020-07-04 NOTE — Patient Instructions (Addendum)
Thank you for coming in today.  I think this is tennis elbow.   Ok to use the counterforce brace for tennis elbow we talked about.   Please complete the exercises that the athletic trainer went over with you: View at [email or web address] using code: [id number]  Please use voltaren gel up to 4x daily for pain as needed.   Recheck in about 1 month.   If not improving I can arrange for physical therapy let me know.

## 2020-07-06 LAB — HM MAMMOGRAPHY

## 2020-07-13 ENCOUNTER — Encounter: Payer: Self-pay | Admitting: *Deleted

## 2020-07-18 ENCOUNTER — Ambulatory Visit: Payer: Medicare PPO | Attending: Internal Medicine

## 2020-07-18 DIAGNOSIS — Z23 Encounter for immunization: Secondary | ICD-10-CM

## 2020-07-18 NOTE — Progress Notes (Signed)
   Covid-19 Vaccination Clinic  Name:  Terri [name]    MRN: [id number] DOB: 07-03-1950  07/18/2020  Terri [name] was observed post Covid-19 immunization for 15 minutes without incident. She was provided with Vaccine Information Sheet and instruction to access the V-Safe system.   Terri [name] was instructed to call 911 with any severe reactions post vaccine: Marland Kitchen Difficulty breathing  . Swelling of face and throat  . A fast heartbeat  . A bad rash all over body  . Dizziness and weakness   Immunizations Administered    Name Date Dose VIS Date Route   PFIZER Comrnaty(Gray TOP) Covid-19 Vaccine 07/18/2020  1:16 PM 0.3 mL 05/11/2020 Intramuscular   Manufacturer: [employer], [employer]   Lot: [id number]   NDC: [phone]

## 2020-07-31 NOTE — Progress Notes (Signed)
   I, Christoper Fabian, LAT, ATC, am serving as scribe for Dr. [name].  Terri [name] is a 70 y.o. female who presents to [facility] today for f/u of R lateral epicondylitis / elbow pain.  She was last seen by Dr. Denyse Amass on 07/04/20 and was shown a HEP focusing on wrist extensor eccentrics and advised to wear a counterforce strap.  Since her last visit, pt reports that her R elbow is feeling much better and has almost no pain at this point.  She states that she has been doing her exercises 3x/day.  Pertinent review of systems: No fevers or chills  Relevant historical information: Pretension, obesity.   Exam:  BP 110/72 (BP Location: Left Arm, Patient Position: Sitting, Cuff Size: Large)   Pulse 82   Ht 5' 4.5" (1.638 m)   Wt 276 lb (125.2 kg)   SpO2 98%   BMI 46.64 kg/m  General: Well Developed, well nourished, and in no acute distress.   MSK: Right elbow normal-appearing nontender normal motion normal strength.  No pain with resisted wrist extension.    Assessment and Plan: 70 y.o. female with right lateral epicondylitis.  Significant improvement with home exercise program.  Continue limited home exercise program and recheck back with me as needed.    Discussed warning signs or symptoms. Please see discharge instructions. Patient expresses understanding.   The above documentation has been reviewed and is accurate and complete [name], M.D.

## 2020-08-01 ENCOUNTER — Other Ambulatory Visit: Payer: Self-pay

## 2020-08-01 ENCOUNTER — Ambulatory Visit: Payer: Medicare PPO | Admitting: Family Medicine

## 2020-08-01 ENCOUNTER — Encounter: Payer: Self-pay | Admitting: Family Medicine

## 2020-08-01 VITALS — BP 110/72 | HR 82 | Ht 64.5 in | Wt 276.0 lb

## 2020-08-01 DIAGNOSIS — M7711 Lateral epicondylitis, right elbow: Secondary | ICD-10-CM | POA: Diagnosis not present

## 2020-08-01 DIAGNOSIS — J3081 Allergic rhinitis due to animal (cat) (dog) hair and dander: Secondary | ICD-10-CM | POA: Diagnosis not present

## 2020-08-01 DIAGNOSIS — J3089 Other allergic rhinitis: Secondary | ICD-10-CM | POA: Diagnosis not present

## 2020-08-01 DIAGNOSIS — J301 Allergic rhinitis due to pollen: Secondary | ICD-10-CM | POA: Diagnosis not present

## 2020-08-01 NOTE — Patient Instructions (Signed)
Thank you for coming in today.  Recheck with me as needed for this or other issues like it.

## 2020-08-05 DIAGNOSIS — Z03818 Encounter for observation for suspected exposure to other biological agents ruled out: Secondary | ICD-10-CM | POA: Diagnosis not present

## 2020-08-05 DIAGNOSIS — Z20822 Contact with and (suspected) exposure to covid-19: Secondary | ICD-10-CM | POA: Diagnosis not present

## 2020-08-15 DIAGNOSIS — J3089 Other allergic rhinitis: Secondary | ICD-10-CM | POA: Diagnosis not present

## 2020-08-15 DIAGNOSIS — J301 Allergic rhinitis due to pollen: Secondary | ICD-10-CM | POA: Diagnosis not present

## 2020-08-15 DIAGNOSIS — J3081 Allergic rhinitis due to animal (cat) (dog) hair and dander: Secondary | ICD-10-CM | POA: Diagnosis not present

## 2020-08-16 ENCOUNTER — Ambulatory Visit: Payer: Medicare PPO

## 2020-08-18 ENCOUNTER — Ambulatory Visit: Payer: Medicare PPO | Attending: Internal Medicine

## 2020-08-18 DIAGNOSIS — Z23 Encounter for immunization: Secondary | ICD-10-CM

## 2020-08-18 NOTE — Progress Notes (Signed)
   Covid-19 Vaccination Clinic  Name:  CHRISTYL OSENTOSKI    MRN: [id number] DOB: 26-Jun-1950  08/18/2020  Ms. Gang was observed post Covid-19 immunization for 15 minutes without incident. She was provided with Vaccine Information Sheet and instruction to access the V-Safe system.   Ms. Dara was instructed to call 911 with any severe reactions post vaccine: Marland Kitchen Difficulty breathing  . Swelling of face and throat  . A fast heartbeat  . A bad rash all over body  . Dizziness and weakness   Immunizations Administered    Name Date Dose VIS Date Route   PFIZER Comrnaty(Gray TOP) Covid-19 Vaccine 08/18/2020 11:16 AM 0.3 mL 05/11/2020 Intramuscular   Manufacturer: [employer], [employer]   Lot: [id number]   NDC: [phone]

## 2020-08-29 DIAGNOSIS — J301 Allergic rhinitis due to pollen: Secondary | ICD-10-CM | POA: Diagnosis not present

## 2020-08-29 DIAGNOSIS — J3089 Other allergic rhinitis: Secondary | ICD-10-CM | POA: Diagnosis not present

## 2020-08-29 DIAGNOSIS — J3081 Allergic rhinitis due to animal (cat) (dog) hair and dander: Secondary | ICD-10-CM | POA: Diagnosis not present

## 2020-09-11 DIAGNOSIS — J3089 Other allergic rhinitis: Secondary | ICD-10-CM | POA: Diagnosis not present

## 2020-09-11 DIAGNOSIS — J3081 Allergic rhinitis due to animal (cat) (dog) hair and dander: Secondary | ICD-10-CM | POA: Diagnosis not present

## 2020-09-11 DIAGNOSIS — J301 Allergic rhinitis due to pollen: Secondary | ICD-10-CM | POA: Diagnosis not present

## 2020-09-19 ENCOUNTER — Other Ambulatory Visit: Payer: Self-pay | Admitting: Internal Medicine

## 2020-09-19 NOTE — Telephone Encounter (Signed)
Message to the patient to make an appointment for further refills on Dexilant.

## 2020-09-26 DIAGNOSIS — J3081 Allergic rhinitis due to animal (cat) (dog) hair and dander: Secondary | ICD-10-CM | POA: Diagnosis not present

## 2020-09-26 DIAGNOSIS — J301 Allergic rhinitis due to pollen: Secondary | ICD-10-CM | POA: Diagnosis not present

## 2020-09-26 DIAGNOSIS — J3089 Other allergic rhinitis: Secondary | ICD-10-CM | POA: Diagnosis not present

## 2020-09-27 DIAGNOSIS — J3089 Other allergic rhinitis: Secondary | ICD-10-CM | POA: Diagnosis not present

## 2020-09-27 DIAGNOSIS — J3081 Allergic rhinitis due to animal (cat) (dog) hair and dander: Secondary | ICD-10-CM | POA: Diagnosis not present

## 2020-09-27 DIAGNOSIS — J301 Allergic rhinitis due to pollen: Secondary | ICD-10-CM | POA: Diagnosis not present

## 2020-09-28 ENCOUNTER — Telehealth (INDEPENDENT_AMBULATORY_CARE_PROVIDER_SITE_OTHER): Payer: Medicare PPO | Admitting: Family Medicine

## 2020-09-28 ENCOUNTER — Other Ambulatory Visit: Payer: Self-pay

## 2020-09-28 DIAGNOSIS — E782 Mixed hyperlipidemia: Secondary | ICD-10-CM | POA: Diagnosis not present

## 2020-09-28 DIAGNOSIS — E559 Vitamin D deficiency, unspecified: Secondary | ICD-10-CM

## 2020-09-28 DIAGNOSIS — I10 Essential (primary) hypertension: Secondary | ICD-10-CM

## 2020-09-28 DIAGNOSIS — E669 Obesity, unspecified: Secondary | ICD-10-CM | POA: Diagnosis not present

## 2020-09-28 DIAGNOSIS — R739 Hyperglycemia, unspecified: Secondary | ICD-10-CM | POA: Diagnosis not present

## 2020-09-28 DIAGNOSIS — G4733 Obstructive sleep apnea (adult) (pediatric): Secondary | ICD-10-CM

## 2020-09-28 NOTE — Assessment & Plan Note (Signed)
Supplement and monitor 

## 2020-09-28 NOTE — Patient Instructions (Signed)
Rybelsius, wegovy, saxenda shots for weight loss   MIND diet  Cooking light Diet

## 2020-09-28 NOTE — Assessment & Plan Note (Signed)
Well controlled, no changes to meds. Encouraged heart healthy diet such as the DASH diet and exercise as tolerated.  °

## 2020-09-28 NOTE — Assessment & Plan Note (Signed)
hgba1c acceptable, minimize simple carbs. Increase exercise as tolerated.  

## 2020-09-28 NOTE — Assessment & Plan Note (Addendum)
Continues to gain weight. Encouraged MIND diet, decrease po intake and increase exercise as tolerated. Needs 7-8 hours of sleep nightly. Avoid trans fats, eat small, frequent meals every 4-5 hours with lean proteins, complex carbs and healthy fats. Minimize simple carbs, did not do well with H W and W. Consider WW or Cooking Light.

## 2020-09-28 NOTE — Assessment & Plan Note (Signed)
Tolerating statin, encouraged heart healthy diet, avoid trans fats, minimize simple carbs and saturated fats. Increase exercise as tolerated 

## 2020-09-30 NOTE — Progress Notes (Signed)
MyChart Video Visit    Virtual Visit via Video Note   This visit type was conducted due to national recommendations for restrictions regarding the COVID-19 Pandemic (e.g. social distancing) in an effort to limit this patient's exposure and mitigate transmission in our community. This patient is at least at moderate risk for complications without adequate follow up. This format is felt to be most appropriate for this patient at this time. Physical exam was limited by quality of the video and audio technology used for the visit. S Chism, CMA was able to get the patient set up on a video visit.  Patient location: home Patient and provider in visit Provider location: Office  I discussed the limitations of evaluation and management by telemedicine and the availability of in person appointments. The patient expressed understanding and agreed to proceed.  Visit Date: 09/28/2020  Today's healthcare provider: Danise Edge, MD     Subjective:    [name], female    DOB: 03-23-1951, 70 y.o.   MRN: [id number]  Chief Complaint  Patient presents with  . Follow-up  . Hypertension  . Hyperlipidemia    Pt has no concerns or problems    HPI Patient is in today for follow up on chronic medical concerns including sleep apnea, obesity, hyperglycemia, and more. She is feeling well today but she is frustrated with her weight gain and she acknowledges she is feeling more sluggish and sob with exertion. She is in need of a new sleep apnea evaluation. No recent febrile illness or hospitalizations. Denies CP/palp/SOB/HA/congestion/fevers/GI or GU c/o. Taking meds as prescribed  Past Medical History:  Diagnosis Date  . Allergic rhinitis   . Apnea, sleep 06/28/2012   Previously used CPAP  . Asthma   . Atypical chest pain 01/14/2017  . Back pain   . Decreased visual acuity 08/05/2016  . Depression 06/19/2015  . Environmental and seasonal allergies   . GERD (gastroesophageal reflux  disease)   . Heartburn   . Hip pain, right 06/28/2012  . HTN (hypertension)   . Hypokalemia 01/14/2017  . Leg pain   . Morbid obesity (HCC) 04/11/2008   Qualifier: Diagnosis of  By: [name]   . Numbness in feet 05/06/2016  . Obesity   . Osteoarthritis 01/16/2010   Qualifier: Diagnosis of  By: [name]   . Preventative health care 06/25/2015  . Shortness of breath   . Shortness of breath on exertion   . Sleep apnea   . Swelling of extremity   . Tinnitus   . Vertigo     Past Surgical History:  Procedure Laterality Date  . BIOPSY BREAST  1997   left   . childbirth    . COLONOSCOPY  2004  . UPPER GASTROINTESTINAL ENDOSCOPY  03/01/2019    Family History  Problem Relation Age of Onset  . Stroke Mother   . Liver disease Mother        hepatitis b  . Hypertension Mother   . Cancer Father        esophagus, prostate  . Hypertension Father   . Heart disease Father   . Esophageal cancer Father   . Liver disease Brother        hep c  . Mental illness Brother        PTSD from [place]  . Stroke Maternal Grandmother   . Cancer Paternal Grandfather        prostate  . Colitis Neg  Hx   . Colon cancer Neg Hx   . Rectal cancer Neg Hx   . Stomach cancer Neg Hx     Social History   Socioeconomic History  . Marital status: Divorced    Spouse name: Not on file  . Number of children: 1  . Years of education: Not on file  . Highest education level: Not on file  Occupational History  . Occupation: HS music teacher- Location manager  Tobacco Use  . Smoking status: Never Smoker  . Smokeless tobacco: Never Used  Vaping Use  . Vaping Use: Never used  Substance and Sexual Activity  . Alcohol use: Yes    Comment: social  . Drug use: No  . 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 Not on file  Social History Narrative   Single/divorced   1 son born 65 - sometimes there w/  her, leukemia 2016   Music teacher [employer]   Caffeine use 1 cup AM   Never smoker/tobacco   No drugs   Rare-occ EtOHSmoking    Social Determinants of Health   Financial Resource Strain: Not on file  Food Insecurity: Not on file  Transportation Needs: Not on file  Physical Activity: Not on file  Stress: Not on file  Social Connections: Not on file  Intimate Partner Violence: Not on file    Outpatient Medications Prior to Visit  Medication Sig Dispense Refill  . albuterol (VENTOLIN HFA) 108 (90 Base) MCG/ACT inhaler Inhale 1-2 puffs into the lungs every 6 (six) hours as needed for wheezing or shortness of breath. 18 g 5  . amLODipine-olmesartan (AZOR) 10-40 MG tablet Take 1 tablet by mouth daily. 90 tablet 1  . dexlansoprazole (DEXILANT) 60 MG capsule Take 1 capsule (60 mg total) by mouth daily before breakfast. Needs appointment for further refills 90 capsule 0  . EPINEPHrine 0.3 mg/0.3 mL IJ SOAJ injection Inject 1 Dose into the muscle as needed for allergies. Inject after allergy shot for anaphylaxis    . famotidine (PEPCID) 40 MG tablet Take 1 tablet (40 mg total) by mouth daily. 90 tablet 3  . fluticasone (FLONASE) 50 MCG/ACT nasal spray Place 1 spray into both nostrils daily as needed.    . fluticasone (FLOVENT HFA) 220 MCG/ACT inhaler Inhale 1 puff into the lungs in the morning and at bedtime. 1 Inhaler 12  . levocetirizine (XYZAL) 5 MG tablet Take 5 mg by mouth every evening.    . meloxicam (MOBIC) 15 MG tablet TAKE 1 TABLET BY MOUTH EVERY DAY AS NEEDED FOR PAIN 30 tablet 1  . montelukast (SINGULAIR) 10 MG tablet TAKE 1 TABLET BY MOUTH EVERYDAY AT BEDTIME 90 tablet 1  . oxybutynin (DITROPAN-XL) 10 MG 24 hr tablet Take 10 mg by mouth daily.     . Vitamin D, Ergocalciferol, (DRISDOL) 1.25 MG (50000 UNIT) CAPS capsule TAKE 1 CAPSULE (50,000 UNITS TOTAL) BY MOUTH EVERY 7 (SEVEN) DAYS. FOR 12 WEEKS 12 capsule 1   No facility-administered medications prior to visit.     Allergies  Allergen Reactions  . Sulfonamide Derivatives   . Sulfa Antibiotics Rash    ROS     Objective:    Physical Exam  There were no vitals taken for this visit. Wt Readings from Last 3 Encounters:  08/01/20 276 lb (125.2 kg)  07/04/20 274 lb 12.8 oz (124.6 kg)  06/29/20 274 lb 3.2  oz (124.4 kg)    Diabetic Foot Exam - Simple   No data filed    Lab Results  Component Value Date   WBC 9.9 06/29/2020   HGB 11.6 (L) 06/29/2020   HCT 36.4 06/29/2020   PLT 417.0 (H) 06/29/2020   GLUCOSE 89 06/29/2020   CHOL 153 06/29/2020   TRIG 86.0 06/29/2020   HDL 47.00 06/29/2020   LDLCALC 89 06/29/2020   ALT 14 06/29/2020   AST 12 06/29/2020   NA 142 06/29/2020   K 4.4 06/29/2020   CL 107 06/29/2020   CREATININE 0.95 06/29/2020   BUN 15 06/29/2020   CO2 30 06/29/2020   TSH 3.82 06/29/2020   HGBA1C 5.5 06/29/2020    Lab Results  Component Value Date   TSH 3.82 06/29/2020   Lab Results  Component Value Date   WBC 9.9 06/29/2020   HGB 11.6 (L) 06/29/2020   HCT 36.4 06/29/2020   MCV 91.2 06/29/2020   PLT 417.0 (H) 06/29/2020   Lab Results  Component Value Date   NA 142 06/29/2020   K 4.4 06/29/2020   CO2 30 06/29/2020   GLUCOSE 89 06/29/2020   BUN 15 06/29/2020   CREATININE 0.95 06/29/2020   BILITOT 0.6 06/29/2020   ALKPHOS 69 06/29/2020   AST 12 06/29/2020   ALT 14 06/29/2020   PROT 6.9 06/29/2020   ALBUMIN 3.8 06/29/2020   CALCIUM 9.4 06/29/2020   ANIONGAP 7 03/19/2019   GFR 61.91 06/29/2020   Lab Results  Component Value Date   CHOL 153 06/29/2020   Lab Results  Component Value Date   HDL 47.00 06/29/2020   Lab Results  Component Value Date   LDLCALC 89 06/29/2020   Lab Results  Component Value Date   TRIG 86.0 06/29/2020   Lab Results  Component Value Date   CHOLHDL 3 06/29/2020   Lab Results  Component Value Date   HGBA1C 5.5 06/29/2020       Assessment & Plan:   Problem List Items Addressed This Visit     Hyperlipidemia, mixed    Tolerating statin, encouraged heart healthy diet, avoid trans fats, minimize simple carbs and saturated fats. Increase exercise as tolerated      Obesity    Continues to gain weight. Encouraged MIND diet, decrease po intake and increase exercise as tolerated. Needs 7-8 hours of sleep nightly. Avoid trans fats, eat small, frequent meals every 4-5 hours with lean proteins, complex carbs and healthy fats. Minimize simple carbs, did not do well with [employer]. Consider WW or [employer].       Essential hypertension    Well controlled, no changes to meds. Encouraged heart healthy diet such as the DASH diet and exercise as tolerated.       Apnea, sleep - Primary    Needs reevaluation of her apnea and equipment is referred to pulmonology for further evaluation and treatment      Relevant Orders   Ambulatory referral to Pulmonology   Vitamin D deficiency    Supplement and monitor      Hyperglycemia    hgba1c acceptable, minimize simple carbs. Increase exercise as tolerated.          I am having Terri [name] maintain her oxybutynin, levocetirizine, fluticasone, EPINEPHrine, Flovent HFA, meloxicam, albuterol, famotidine, Vitamin D (Ergocalciferol), montelukast, amLODipine-olmesartan, and dexlansoprazole.  No orders of the defined types were placed in this encounter.   I discussed the assessment and treatment plan with the patient.  The patient was provided an opportunity to ask questions and all were answered. The patient agreed with the plan and demonstrated an understanding of the instructions.   The patient was advised to call back or seek an in-person evaluation if the symptoms worsen or if the condition fails to improve as anticipated.  I provided 25 minutes of face-to-face time during this encounter.   Danise EdgeStacey Ashleah Valtierra, MD [facility] [phone] (phone) [phone] (fax)  [facility]

## 2020-09-30 NOTE — Assessment & Plan Note (Signed)
Needs reevaluation of her apnea and equipment is referred to pulmonology for further evaluation and treatment

## 2020-10-03 ENCOUNTER — Other Ambulatory Visit: Payer: Self-pay | Admitting: Family Medicine

## 2020-10-03 DIAGNOSIS — J309 Allergic rhinitis, unspecified: Secondary | ICD-10-CM

## 2020-10-03 DIAGNOSIS — R059 Cough, unspecified: Secondary | ICD-10-CM

## 2020-10-11 DIAGNOSIS — J3089 Other allergic rhinitis: Secondary | ICD-10-CM | POA: Diagnosis not present

## 2020-10-11 DIAGNOSIS — J301 Allergic rhinitis due to pollen: Secondary | ICD-10-CM | POA: Diagnosis not present

## 2020-10-11 DIAGNOSIS — J3081 Allergic rhinitis due to animal (cat) (dog) hair and dander: Secondary | ICD-10-CM | POA: Diagnosis not present

## 2020-10-25 DIAGNOSIS — J301 Allergic rhinitis due to pollen: Secondary | ICD-10-CM | POA: Diagnosis not present

## 2020-10-25 DIAGNOSIS — J3081 Allergic rhinitis due to animal (cat) (dog) hair and dander: Secondary | ICD-10-CM | POA: Diagnosis not present

## 2020-10-25 DIAGNOSIS — J3089 Other allergic rhinitis: Secondary | ICD-10-CM | POA: Diagnosis not present

## 2020-11-01 NOTE — Progress Notes (Signed)
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 Medical problem list includes HTN, GERD, Asthma, Allergic Rhinitis, Morbid Obesity, HYperlipidemia,  -Singulair, Flovent 220 hfa, flonase, Ventolin hfa,  NPSG 06/22/2017-[facility] ([facility]) AHI 15.9/ hr, desaturation to 71%, body weight 273 lbs, CPAP to 7. Epworth score-2 Body weight today-272 lbs Covid vax- -----Pt is being referred by PCP for sleep consult.  Pt states that she has been told that she does snore. States she did have a sleep study about 3 years ago. No longer using old CPAP machine- she didn't like mask on face.  Sleeps alone- no reporter about snore or witnessed apnea. Occ Health evaluator told he she needed to update her sleep study. No sleep meds and very little caffeine. Minimizes daytime sleepiness. On allergy vaccine- [place]. GERD disturbs sleep- on med.   Prior to Admission medications   Medication Sig Start Date End Date Taking? Authorizing Provider  albuterol (VENTOLIN HFA) 108 (90 Base) MCG/ACT inhaler Inhale 1-2 puffs into the lungs every 6 (six) hours as needed for wheezing or shortness of breath. 12/07/19  Yes Bradd Canary, MD  amLODipine-olmesartan (AZOR) 10-40 MG tablet Take 1 tablet by mouth daily. 07/04/20  Yes Bradd Canary, MD  dexlansoprazole (DEXILANT) 60 MG capsule Take 1 capsule (60 mg total) by mouth daily before breakfast. Needs appointment for further refills 09/19/20  Yes Iva Boop, MD  EPINEPHrine 0.3 mg/0.3 mL IJ SOAJ injection Inject 1 Dose into the muscle as needed for allergies. Inject after allergy shot for anaphylaxis   Yes [provider]  famotidine (PEPCID) 40 MG tablet Take 1 tablet (40 mg total) by mouth daily. 02/01/20  Yes Bradd Canary, MD  fluticasone (FLONASE) 50 MCG/ACT nasal spray Place 1 spray into both nostrils daily as needed. 07/13/17  Yes [provider]  fluticasone (FLOVENT HFA) 220 MCG/ACT inhaler Inhale 1 puff into the lungs in the morning and at bedtime. 08/16/19  Yes Bradd Canary, MD  levocetirizine (XYZAL) 5 MG tablet Take 5 mg by mouth every evening.   Yes [provider]  montelukast (SINGULAIR) 10 MG tablet TAKE 1 TABLET BY MOUTH EVERYDAY AT BEDTIME 10/04/20  Yes Bradd Canary, MD  oxybutynin (DITROPAN-XL) 10 MG 24 hr tablet Take 10 mg by mouth daily.    Yes [provider]   Past Medical History:  Diagnosis Date  . Allergic rhinitis   . Apnea, sleep 06/28/2012   Previously used CPAP  . Asthma   . Atypical chest pain 01/14/2017  . Back pain   . Decreased visual acuity 08/05/2016  . Depression 06/19/2015  . Environmental and seasonal allergies   . GERD (gastroesophageal reflux disease)   . Heartburn   . Hip pain, right 06/28/2012  . HTN (hypertension)   . Hypokalemia 01/14/2017  . Leg pain   . Morbid obesity (HCC) 04/11/2008   Qualifier: Diagnosis of  By: [name]   . Numbness in feet 05/06/2016  . Obesity   . Osteoarthritis 01/16/2010   Qualifier: Diagnosis of  By: [name]   . Preventative health care 06/25/2015  . Shortness of breath   . Shortness of breath on exertion   . Sleep apnea   . Swelling of extremity   . Tinnitus   . Vertigo    Past Surgical History:  Procedure Laterality Date  . BIOPSY BREAST  1997  left   . childbirth    . COLONOSCOPY  2004  . UPPER GASTROINTESTINAL ENDOSCOPY  03/01/2019   Family History  Problem Relation Age of Onset  . Stroke Mother   . Liver disease Mother        hepatitis b  . Hypertension Mother   . Cancer Father        esophagus, prostate  . Hypertension Father   . Heart disease Father   . Esophageal cancer Father   . Liver disease Brother        hep c  . Mental illness Brother        PTSD from [place]  . Stroke Maternal Grandmother   . Cancer Paternal Grandfather        prostate  . Colitis Neg Hx   . Colon cancer  Neg Hx   . Rectal cancer Neg Hx   . Stomach cancer Neg Hx    Social History   Socioeconomic History  . Marital status: Divorced    Spouse name: Not on file  . Number of children: 1  . Years of education: Not on file  . Highest education level: Not on file  Occupational History  . Occupation: HS music teacher- Location manager  Tobacco Use  . Smoking status: Never Smoker  . Smokeless tobacco: Never Used  Vaping Use  . Vaping Use: Never used  Substance and Sexual Activity  . Alcohol use: Yes    Comment: social  . Drug use: No  . 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 Not on file  Social History Narrative   Single/divorced   1 son born 72 - sometimes there w/ her, leukemia 2016   Music teacher [employer]   Caffeine use 1 cup AM   Never smoker/tobacco   No drugs   Rare-occ EtOHSmoking    Social Determinants of Health   Financial Resource Strain: Not on file  Food Insecurity: Not on file  Transportation Needs: Not on file  Physical Activity: Not on file  Stress: Not on file  Social Connections: Not on file  Intimate Partner Violence: Not on file   ROS-see HPI   + = positive Constitutional:    weight loss, night sweats, fevers, chills, fatigue, lassitude. HEENT:    headaches, difficulty swallowing, tooth/dental problems, sore throat,       sneezing, itching, ear ache, +nasal congestion, post nasal drip, snoring CV:    chest pain, orthopnea, PND, +swelling in lower extremities, anasarca,                                  dizziness, palpitations Resp:   shortness of breath with exertion or at rest.                productive cough,   +non-productive cough, coughing up of blood.              change in color of mucus.  wheezing.   Skin:    rash or lesions. GI:  +heartburn, indigestion, abdominal pain, nausea, vomiting, diarrhea,                 change in bowel habits, loss of  appetite GU: dysuria, change in color of urine, no urgency or frequency.   flank pain. MS:   joint pain, stiffness, decreased range  of motion, back pain. Neuro-     nothing unusual Psych:  change in mood or affect.  depression or anxiety.   memory loss.  OBJ- Physical Exam General- Alert, Oriented, Affect-appropriate, Distress- none acute, + obese Skin- rash-none, lesions- none, excoriation- none Lymphadenopathy- none Head- atraumatic            Eyes- Gross vision intact, PERRLA, conjunctivae and secretions clear            Ears- Hearing, canals-normal            Nose- Clear, no-Septal dev, mucus, polyps, erosion, perforation             Throat- Mallampati IV , mucosa clear , drainage- none, tonsils- atrophic, + teeth Neck- flexible , trachea midline, no stridor , thyroid nl, carotid no bruit Chest - symmetrical excursion , unlabored           Heart/CV- RRR , no murmur , no gallop  , no rub, nl s1 s2                           - JVD- none , edema- none, stasis changes- none, varices- none           Lung- clear to P&A, wheeze+trace, cough- none , dullness-none, rub- none           Chest wall-  Abd-  Br/ Gen/ Rectal- Not done, not indicated Extrem- cyanosis- none, clubbing, none, atrophy- none, strength- nl Neuro- grossly intact to observation

## 2020-11-02 ENCOUNTER — Other Ambulatory Visit: Payer: Self-pay

## 2020-11-02 ENCOUNTER — Ambulatory Visit (INDEPENDENT_AMBULATORY_CARE_PROVIDER_SITE_OTHER): Payer: Medicare PPO | Admitting: Internal Medicine

## 2020-11-02 ENCOUNTER — Encounter: Payer: Self-pay | Admitting: Internal Medicine

## 2020-11-02 VITALS — BP 120/76 | HR 77 | Ht 65.0 in | Wt 272.4 lb

## 2020-11-02 DIAGNOSIS — E669 Obesity, unspecified: Secondary | ICD-10-CM | POA: Diagnosis not present

## 2020-11-02 DIAGNOSIS — K449 Diaphragmatic hernia without obstruction or gangrene: Secondary | ICD-10-CM | POA: Diagnosis not present

## 2020-11-02 DIAGNOSIS — K219 Gastro-esophageal reflux disease without esophagitis: Secondary | ICD-10-CM

## 2020-11-02 DIAGNOSIS — G4733 Obstructive sleep apnea (adult) (pediatric): Secondary | ICD-10-CM | POA: Diagnosis not present

## 2020-11-02 NOTE — Patient Instructions (Signed)
Order- schedule home sleep test   Dx OSA  Please cal [name] about 2 weeks after your sleep test for results and recommendation.

## 2020-11-05 NOTE — Assessment & Plan Note (Signed)
She describes ongoing symptoms on meds and may need GI reassessment.

## 2020-11-05 NOTE — Assessment & Plan Note (Signed)
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 With her obesity, CPAP is still likely to be best choice. There may be room to make it more comfortable with mask fitting.  Plan= sleep study

## 2020-11-05 NOTE — Assessment & Plan Note (Signed)
Consider referral for Healthy Weight and Wellness

## 2020-11-08 DIAGNOSIS — J3089 Other allergic rhinitis: Secondary | ICD-10-CM | POA: Diagnosis not present

## 2020-11-08 DIAGNOSIS — J301 Allergic rhinitis due to pollen: Secondary | ICD-10-CM | POA: Diagnosis not present

## 2020-11-08 DIAGNOSIS — J3081 Allergic rhinitis due to animal (cat) (dog) hair and dander: Secondary | ICD-10-CM | POA: Diagnosis not present

## 2020-11-22 DIAGNOSIS — J301 Allergic rhinitis due to pollen: Secondary | ICD-10-CM | POA: Diagnosis not present

## 2020-11-22 DIAGNOSIS — J3081 Allergic rhinitis due to animal (cat) (dog) hair and dander: Secondary | ICD-10-CM | POA: Diagnosis not present

## 2020-11-22 DIAGNOSIS — J3089 Other allergic rhinitis: Secondary | ICD-10-CM | POA: Diagnosis not present

## 2020-11-28 ENCOUNTER — Other Ambulatory Visit: Payer: Self-pay

## 2020-11-28 ENCOUNTER — Ambulatory Visit: Payer: Medicare PPO | Admitting: Family Medicine

## 2020-11-28 VITALS — BP 118/70 | HR 77 | Temp 98.4°F | Resp 16 | Wt 271.0 lb

## 2020-11-28 DIAGNOSIS — E782 Mixed hyperlipidemia: Secondary | ICD-10-CM

## 2020-11-28 DIAGNOSIS — M791 Myalgia, unspecified site: Secondary | ICD-10-CM

## 2020-11-28 DIAGNOSIS — T22111A Burn of first degree of right forearm, initial encounter: Secondary | ICD-10-CM | POA: Diagnosis not present

## 2020-11-28 DIAGNOSIS — R739 Hyperglycemia, unspecified: Secondary | ICD-10-CM

## 2020-11-28 DIAGNOSIS — I10 Essential (primary) hypertension: Secondary | ICD-10-CM

## 2020-11-28 DIAGNOSIS — E88819 Insulin resistance, unspecified: Secondary | ICD-10-CM

## 2020-11-28 DIAGNOSIS — J4530 Mild persistent asthma, uncomplicated: Secondary | ICD-10-CM | POA: Diagnosis not present

## 2020-11-28 DIAGNOSIS — E8881 Metabolic syndrome: Secondary | ICD-10-CM | POA: Diagnosis not present

## 2020-11-28 DIAGNOSIS — E669 Obesity, unspecified: Secondary | ICD-10-CM

## 2020-11-28 DIAGNOSIS — E559 Vitamin D deficiency, unspecified: Secondary | ICD-10-CM | POA: Diagnosis not present

## 2020-11-28 DIAGNOSIS — T2200XA Burn of unspecified degree of shoulder and upper limb, except wrist and hand, unspecified site, initial encounter: Secondary | ICD-10-CM | POA: Insufficient documentation

## 2020-11-28 LAB — CBC
HCT: 34.9 % — ABNORMAL LOW (ref 36.0–46.0)
Hemoglobin: 11.1 g/dL — ABNORMAL LOW (ref 12.0–15.0)
MCHC: 31.8 g/dL (ref 30.0–36.0)
MCV: 89 fl (ref 78.0–100.0)
Platelets: 408 10*3/uL — ABNORMAL HIGH (ref 150.0–400.0)
RBC: 3.92 Mil/uL (ref 3.87–5.11)
RDW: 17.3 % — ABNORMAL HIGH (ref 11.5–15.5)
WBC: 10.4 10*3/uL (ref 4.0–10.5)

## 2020-11-28 LAB — TSH: TSH: 2.58 u[IU]/mL (ref 0.35–4.50)

## 2020-11-28 LAB — HEMOGLOBIN A1C: Hgb A1c MFr Bld: 5.8 % (ref 4.6–6.5)

## 2020-11-28 NOTE — Patient Instructions (Addendum)
Magnesium for cramps Hyland's leg cramp medicine over the counter   Burn Care, Adult A burn is an injury to the skin or the tissues under the skin that is caused by a fire, hot liquid, chemical, or electricity. There are three types of burns: First degree. These burns may cause the skin to be red and slightly swollen. These burns do not blister or scar. Second degree. These burns are very painful and cause the skin to be very red. The skin may also swell, leak fluid, look shiny, and develop blisters. Third degree. These burns cause permanent damage. They either turn the skin white or black and make it look charred, dry, and leathery. These burns may not be painful due to damage to the nerve endings. Treatment for your burn will depend on the type of burn you have. Taking care of your burn properly can help to prevent pain and infection. It can also helpthe burn heal more quickly. How to care for a first-degree burn Right after a burn: Rinse or soak the burn under cool water for 5 minutes or more. Do not put ice on your burn. This can cause more damage. Apply a cool, clean, wet cloth (cool compress) to your skin. This may help with pain. Put lotion or gel with aloe vera on your skin. This may help soothe the burn. Caring for the burn Follow instructions from your health care provider about cleaning and caring for the burn. This may include: Using mild soap and water to clean the area. Using a clean cloth to pat the burned area dry after cleaning it. Do not rub or scrub the burn. Applying lotion or gel with aloe vera to your skin. How to care for a second-degree burn Right after a burn: Rinse or soak the burn under cool water. Do this for 5 to 10 minutes. Do not put ice on your burn. This can cause more damage. Remove any jewelry near the burned area. Lightly cover the burn with a clean cloth. Caring for the burn Raise (elevate) the injured area above the level of your heart while sitting or  lying down. Follow instructions from your health care provider about cleaning and caring for the burn. This may include: Cleaning or rinsing out (irrigating) the burned area. Putting a cream or ointment on the burn. Placing a germ-free (sterile) dressing over the burn. A dressing is a material that is placed over a burn to help it heal. How to care for a third-degree burn Right after a burn: Lightly cover the burn with a clean, dry cloth. Seek treatment right away if you have this kind of burn. You may: Require admission to the hospital. Be treated with surgery to remove damaged tissue or to place a skin graft to cover the damaged area. Be given IV fluids to keep you hydrated. Caring for the burn Follow instructions from your health care provider about cleaning and caring for the burn. This may include: Cleaning or rinsing out (irrigating) the burned area. Putting a cream or ointment on the burn. Placing a sterile dressing in the burned area (packing). Placing a sterile dressing over the burn. Other instructions Elevate the injured area above the level of your heart while sitting or lying down. Wear splints or immobilizers as instructed by your health care provider. Rest as told by your health care provider. Do not participate in sports or other physical activities until your health care provider approves. How to prevent infection when caring for a burn  Take these steps to prevent infection: Wash your hands with soap and water for at least 20 seconds before and after burn care. If soap and water are not available, use hand sanitizer. Wear clean or sterile gloves as directed by your health care provider. Do not put butter, oil, toothpaste, or other home remedies on the burn. Do not scratch or pick at the burn. Do not break any blisters. Do not peel the skin. Do not rub your burn, even when you are cleaning it. Check your burn every day for these signs of infection: More redness,  swelling, or pain. Warmth. Pus or a bad smell. Red streaks around the burn. Follow these instructions at home  Medicines Take over-the-counter and prescription medicines only as told by your health care provider. If you were prescribed an antibiotic medicine, take or apply it as told by your health care provider. Do not stop using the antibiotic even if your condition improves. Your health care provider may recommend taking over-the-counter or prescription pain medicine before changing your dressing. General instructions Protect your burn from the sun. Drink enough fluid to keep your urine pale yellow. Do not use any products that contain nicotine or tobacco, such as cigarettes, e-cigarettes, and chewing tobacco. These can delay healing. If you need help quitting, ask your health care provider. Keep all follow-up visits as told by your health care provider. This is important. Contact a health care provider if: Your condition does not improve. Your condition gets worse. You have a fever or chills. Your burn feels warm to the touch. You have more redness, swelling, or pain at the site of the burn. Your burn changes in appearance or develops black or red spots. You have pain that is not controlled with medicine. Get help right away if you have: More fluid, blood, or pus coming from your burn. Red streaks near the burn. Severe pain. Summary There are three types of burns. They are first degree, second degree, and third degree. The most severe type of burn is a third-degree burn which must be treated right away. Treatment for your burn will depend on the type of burn you have. Do not put butter, oil, toothpaste, or other home remedies on the burn. This can cause more damage to the tissue. Follow instructions from your health care provider about how to clean and take care of your burn. This information is not intended to replace advice given to you by your health care provider. Make sure  you discuss any questions you have with your healthcare provider. Document Revised: 03/09/2019 Document Reviewed: 03/09/2019 Elsevier Patient Education  2022 [employer] Blisters, Adult  A blister is a raised bubble of skin filled with liquid. Blisters often develop on skin that rubs or presses against another surface repeatedly (friction blister). Friction blisters can occur on any part of the body, but they usually form on the hands or the feet. Long-term pressure on that same area of skin can also cause the area of skin to become hardened. This hardened skin is called acallus. What are the causes? Besides friction, blisters can be caused by: An injury, such as a burn. An allergic reaction. An infection. Exposure to irritating chemicals. Friction blisters often occur in areas with a lot of heat and moisture. Friction blisters often result from: Sports. Repetitive activities. Using tools and doing other activities without wearing gloves. Shoes that are too tight or too loose. What are the signs or symptoms? A blister is often round and looks  like a bump. It may hurt or feel itchy. Before a blister forms, your skin may: Turn red. Feel warm. Itch. Be painful to the touch. How is this diagnosed? A blister is diagnosed with a physical exam. How is this treated? Treatment usually involves protecting the area where the blister has formed until your skin has healed. Treatments may include: Using a bandage (dressing) to cover your blister. Putting extra padding around and over your blister so that the blister does not rub on anything. Applying antibiotic ointment. Most blisters break open, dry up, and go away on their own within 1-2 weeks. Blisters that are very painful may be drained before they break open on their own. If your blister is large or painful, your health care provider can drainit. Follow these instructions at home: Medicines Take or apply over-the-counter and prescription  medicines only as told by your health care provider. If you were prescribed an antibiotic medicine, take or apply it as told by your health care provider. Do not stop using the antibiotic even if you start to feel better. Skin care Do not pop your blister. This can cause infection. Keep your blister clean and dry. This helps to prevent infection. Before you swim or use a hot tub, cover your blister with a waterproof dressing. Protect the area where your blister has formed as told by your health care provider. Follow instructions from your health care provider about how to take care of your blister. Make sure you: Wash your hands with soap and water for at least 20 seconds before and after you change your dressing. If soap and water are not available, use hand sanitizer. Change your dressing as told by your health care provider. Infection signs Check your blister every day for signs of infection. Check for: More redness, swelling, or pain. More fluid or blood. Warmth. Pus or a bad smell. General instructions If you have a blister on a foot or toe, wear different shoes until your blister heals. Avoid the activity that caused the blister until your blister heals. Keep all follow-up visits as told by your health care provider. This is important. How is this prevented? Taking these steps can help to prevent blisters that are caused by friction. Make sure you: Wear comfortable shoes that fit well. Always wear socks with shoes. Wear extra socks or use tape, dressings, or pads over blister-prone areas as needed. You may also apply petroleum jelly under dressings in blister-prone areas. Wear protective gear, such as gloves, when taking part in sports or activities that can cause blisters. Wear loose-fitting, moisture-wicking clothes when taking part in sports or activities. Use powders as needed to keep your feet dry. Contact a health care provider if: You have more redness, swelling, or pain  around your blister. You have more fluid or blood coming from your blister. Your blister feels warm to the touch. You have pus or a bad smell coming from your blister. You have a fever or chills. Your blister gets better and then it gets worse. Summary A blister is a raised bubble of skin filled with liquid. Blisters often develop on skin that rubs or presses against another surface repeatedly (friction blister). Most blisters break open, dry up, and go away on their own within 1-2 weeks. Keep your blister clean and dry. This helps to prevent infection. Take steps to help prevent blisters that are caused by friction. Contact a health care provider if you have signs of infection. This information is not intended to  replace advice given to you by your health care provider. Make sure you discuss any questions you have with your healthcare provider. Document Revised: 04/08/2019 Document Reviewed: 04/08/2019 Elsevier Patient Education  2022 [employer].

## 2020-11-28 NOTE — Progress Notes (Signed)
Patient ID: ECHO PROPP, female    DOB: 09-19-1950  Age: 70 y.o. MRN: [id number]    Subjective:  Subjective  HPI Terri [name] presents for office visit today for follow up on weight management and burn on left arm. She reports that she experienced the burn on her left arm last Wednesday. She states that her injury has improved. She denies CP/palp/SOB/HA/congestion/fevers/GI or GU c/o. Taking meds as prescribed.   She reports that she has started line dancing but has been experiencing leg cramps. She states that her cramps get worse in the middle of the night that they sometimes wake her up.  Review of Systems  Constitutional:  Negative for chills, fatigue and fever.  HENT:  Negative for congestion, rhinorrhea, sinus pressure, sinus pain and sore throat.   Eyes:  Negative for pain.  Respiratory:  Negative for cough and shortness of breath.   Cardiovascular:  Negative for chest pain, palpitations and leg swelling.  Gastrointestinal:  Negative for abdominal pain, blood in stool, diarrhea, nausea and vomiting.  Genitourinary:  Negative for decreased urine volume, flank pain, frequency, vaginal bleeding and vaginal discharge.  Musculoskeletal:  Negative for back pain.       (+) bilateral legs cramps  Neurological:  Negative for headaches.   History Past Medical History:  Diagnosis Date   Allergic rhinitis    Apnea, sleep 06/28/2012   Previously used CPAP   Asthma    Atypical chest pain 01/14/2017   Back pain    Decreased visual acuity 08/05/2016   Depression 06/19/2015   Environmental and seasonal allergies    GERD (gastroesophageal reflux disease)    Heartburn    Hip pain, right 06/28/2012   HTN (hypertension)    Hypokalemia 01/14/2017   Leg pain    Morbid obesity (HCC) 04/11/2008   Qualifier: Diagnosis of  By: [name]    Numbness in feet 05/06/2016   Obesity    Osteoarthritis 01/16/2010   Qualifier: Diagnosis of  By: [name]    Preventative health care  06/25/2015   Shortness of breath    Shortness of breath on exertion    Sleep apnea    Swelling of extremity    Tinnitus    Vertigo     She has a past surgical history that includes childbirth; Biopsy breast (1997); Colonoscopy (2004); and Upper gastrointestinal endoscopy (03/01/2019).   Her family history includes Cancer in her father and paternal grandfather; Esophageal cancer in her father; Heart disease in her father; Hypertension in her father and mother; Liver disease in her brother and mother; Mental illness in her brother; Stroke in her maternal grandmother and mother.She reports that she has never smoked. She has never used smokeless tobacco. She reports current alcohol use. She reports that she does not use drugs.  Current Outpatient Medications on File Prior to Visit  Medication Sig Dispense Refill   albuterol (VENTOLIN HFA) 108 (90 Base) MCG/ACT inhaler Inhale 1-2 puffs into the lungs every 6 (six) hours as needed for wheezing or shortness of breath. 18 g 5   amLODipine-olmesartan (AZOR) 10-40 MG tablet Take 1 tablet by mouth daily. 90 tablet 1   dexlansoprazole (DEXILANT) 60 MG capsule Take 1 capsule (60 mg total) by mouth daily before breakfast. Needs appointment for further refills 90 capsule 0   EPINEPHrine 0.3 mg/0.3 mL IJ SOAJ injection Inject 1 Dose into the muscle as needed for allergies. Inject after allergy shot for anaphylaxis  famotidine (PEPCID) 40 MG tablet Take 1 tablet (40 mg total) by mouth daily. 90 tablet 3   fluticasone (FLONASE) 50 MCG/ACT nasal spray Place 1 spray into both nostrils daily as needed.     fluticasone (FLOVENT HFA) 220 MCG/ACT inhaler Inhale 1 puff into the lungs in the morning and at bedtime. 1 Inhaler 12   levocetirizine (XYZAL) 5 MG tablet Take 5 mg by mouth every evening.     montelukast (SINGULAIR) 10 MG tablet TAKE 1 TABLET BY MOUTH EVERYDAY AT BEDTIME 90 tablet 1   oxybutynin (DITROPAN-XL) 10 MG 24 hr tablet Take 10 mg by mouth daily.       No current facility-administered medications on file prior to visit.     Objective:  Objective  Physical Exam Constitutional:      General: She is not in acute distress.    Appearance: Normal appearance. She is not ill-appearing or toxic-appearing.  HENT:     Head: Normocephalic and atraumatic.     Right Ear: Tympanic membrane, ear canal and external ear normal.     Left Ear: Tympanic membrane, ear canal and external ear normal.     Nose: No congestion or rhinorrhea.  Eyes:     Extraocular Movements: Extraocular movements intact.     Pupils: Pupils are equal, round, and reactive to light.  Cardiovascular:     Rate and Rhythm: Normal rate and regular rhythm.     Pulses: Normal pulses.     Heart sounds: Normal heart sounds. No murmur heard. Pulmonary:     Effort: Pulmonary effort is normal. No respiratory distress.     Breath sounds: Wheezing (difused) present. No rhonchi or rales.  Abdominal:     General: Bowel sounds are normal.     Palpations: Abdomen is soft. There is no mass.     Tenderness: no abdominal tenderness There is no guarding.     Hernia: No hernia is present.  Musculoskeletal:        General: Normal range of motion.     Cervical back: Normal range of motion and neck supple.  Skin:    General: Skin is warm and dry.     Findings: Burn (local to left arm) present.  Neurological:     Mental Status: She is alert and oriented to person, place, and time.  Psychiatric:        Behavior: Behavior normal.   BP 118/70   Pulse 77   Temp 98.4 F (36.9 C)   Resp 16   Wt 271 lb (122.9 kg)   SpO2 95%   BMI 45.10 kg/m  Wt Readings from Last 3 Encounters:  11/28/20 271 lb (122.9 kg)  11/02/20 272 lb 6.4 oz (123.6 kg)  08/01/20 276 lb (125.2 kg)     Lab Results  Component Value Date   WBC 10.4 11/28/2020   HGB 11.1 (L) 11/28/2020   HCT 34.9 (L) 11/28/2020   PLT 408.0 (H) 11/28/2020   GLUCOSE 98 11/28/2020   CHOL 145 11/28/2020   TRIG 83.0 11/28/2020    HDL 41.30 11/28/2020   LDLCALC 87 11/28/2020   ALT 9 11/28/2020   AST 12 11/28/2020   NA 141 11/28/2020   K 4.4 11/28/2020   CL 105 11/28/2020   CREATININE 1.44 (H) 11/28/2020   BUN 20 11/28/2020   CO2 27 11/28/2020   TSH 2.58 11/28/2020   HGBA1C 5.8 11/28/2020    DG Chest 2 View  Result Date: 08/16/2019 CLINICAL DATA:  Chronic cough and  wheezing for approximately 1 year. EXAM: CHEST - 2 VIEW COMPARISON:  03/19/2019 FINDINGS: The heart size and mediastinal contours are within normal limits. Both lungs are clear. The visualized skeletal structures are unremarkable. IMPRESSION: Stable exam.  No active cardiopulmonary disease. Electronically Signed   By: Danae Orleans M.D.   On: 08/16/2019 15:20     Assessment & Plan:  Plan    No orders of the defined types were placed in this encounter.   Problem List Items Addressed This Visit     Hyperlipidemia, mixed - Primary    Encourage heart healthy diet such as MIND or DASH diet, increase exercise, avoid trans fats, simple carbohydrates and processed foods, consider a krill or fish or flaxseed oil cap daily.        Relevant Orders   Lipid panel (Completed)   Obesity   Essential hypertension    Well controlled, no changes to meds. Encouraged heart healthy diet such as the DASH diet and exercise as tolerated.        Relevant Orders   CBC (Completed)   Comprehensive metabolic panel (Completed)   TSH (Completed)   Asthma   Relevant Orders   Ambulatory referral to Pulmonology   Vitamin D deficiency    Supplement and monitor       Relevant Orders   VITAMIN D 25 Hydroxy (Vit-D Deficiency, Fractures) (Completed)   Insulin resistance   Relevant Orders   Hemoglobin A1c (Completed)   Hyperglycemia    hgba1c acceptable, minimize simple carbs. Increase exercise as tolerated.        Burn of right arm    While trying to stop a mirror from falling she pressed her right forearm on her curling iron last week. She has been keeping it  clean and dry and using neosporin. She shows pictures of        Myalgia    Intermittent muscle cramps in the her legs. Encouraged increased hydration, magnesium and hydland's leg cramp medicine and check labs       Relevant Orders   Magnesium (Completed)    Follow-up: Return in about 4 months (around 03/30/2021), or 4 mn f/u Feb 2023.  I, Billie Lade, acting as a scribe for Danise Edge, MD, have documented all relevent documentation on behalf of Danise Edge, MD, as directed by Danise Edge, MD while in the presence of Danise Edge, MD.  I, Bradd Canary, MD personally performed the services described in this documentation. All medical record entries made by the scribe were at my direction and in my presence. I have reviewed the chart and agree that the record reflects my personal performance and is accurate and complete

## 2020-11-29 DIAGNOSIS — M791 Myalgia, unspecified site: Secondary | ICD-10-CM | POA: Insufficient documentation

## 2020-11-29 LAB — LIPID PANEL
Cholesterol: 145 mg/dL (ref 0–200)
HDL: 41.3 mg/dL (ref >39.00)
LDL Cholesterol: 87 mg/dL (ref 0–99)
NonHDL: 103.28
Total CHOL/HDL Ratio: 4
Triglycerides: 83 mg/dL (ref 0.0–149.0)
VLDL: 16.6 mg/dL (ref 0.0–40.0)

## 2020-11-29 LAB — COMPREHENSIVE METABOLIC PANEL
ALT: 9 U/L (ref 0–35)
AST: 12 U/L (ref 0–37)
Albumin: 3.9 g/dL (ref 3.5–5.2)
Alkaline Phosphatase: 74 U/L (ref 39–117)
BUN: 20 mg/dL (ref 6–23)
CO2: 27 mEq/L (ref 19–32)
Calcium: 9.6 mg/dL (ref 8.4–10.5)
Chloride: 105 mEq/L (ref 96–112)
Creatinine, Ser: 1.44 mg/dL — ABNORMAL HIGH (ref 0.40–1.20)
GFR: 37.47 mL/min — ABNORMAL LOW (ref >60.00)
Glucose, Bld: 98 mg/dL (ref 70–99)
Potassium: 4.4 mEq/L (ref 3.5–5.1)
Sodium: 141 mEq/L (ref 135–145)
Total Bilirubin: 0.3 mg/dL (ref 0.2–1.2)
Total Protein: 6.9 g/dL (ref 6.0–8.3)

## 2020-11-29 LAB — MAGNESIUM: Magnesium: 2.1 mg/dL (ref 1.5–2.5)

## 2020-11-29 LAB — VITAMIN D 25 HYDROXY (VIT D DEFICIENCY, FRACTURES): VITD: 23.31 ng/mL — ABNORMAL LOW (ref 30.00–100.00)

## 2020-11-29 NOTE — Assessment & Plan Note (Signed)
hgba1c acceptable, minimize simple carbs. Increase exercise as tolerated.  

## 2020-11-29 NOTE — Assessment & Plan Note (Signed)
Well controlled, no changes to meds. Encouraged heart healthy diet such as the DASH diet and exercise as tolerated.  °

## 2020-11-29 NOTE — Assessment & Plan Note (Signed)
Encourage heart healthy diet such as MIND or DASH diet, increase exercise, avoid trans fats, simple carbohydrates and processed foods, consider a krill or fish or flaxseed oil cap daily.  °

## 2020-11-29 NOTE — Assessment & Plan Note (Signed)
Supplement and monitor 

## 2020-11-29 NOTE — Assessment & Plan Note (Signed)
While trying to stop a mirror from falling she pressed her right forearm on her curling iron last week. She has been keeping it clean and dry and using neosporin. She shows pictures of

## 2020-11-29 NOTE — Assessment & Plan Note (Signed)
Intermittent muscle cramps in the her legs. Encouraged increased hydration, magnesium and hydland's leg cramp medicine and check labs

## 2020-11-30 ENCOUNTER — Ambulatory Visit: Payer: Medicare PPO

## 2020-11-30 ENCOUNTER — Other Ambulatory Visit: Payer: Self-pay

## 2020-11-30 DIAGNOSIS — G4733 Obstructive sleep apnea (adult) (pediatric): Secondary | ICD-10-CM

## 2020-12-01 ENCOUNTER — Other Ambulatory Visit: Payer: Self-pay

## 2020-12-01 DIAGNOSIS — R7989 Other specified abnormal findings of blood chemistry: Secondary | ICD-10-CM

## 2020-12-06 DIAGNOSIS — J3089 Other allergic rhinitis: Secondary | ICD-10-CM | POA: Diagnosis not present

## 2020-12-06 DIAGNOSIS — G4733 Obstructive sleep apnea (adult) (pediatric): Secondary | ICD-10-CM | POA: Diagnosis not present

## 2020-12-06 DIAGNOSIS — J3081 Allergic rhinitis due to animal (cat) (dog) hair and dander: Secondary | ICD-10-CM | POA: Diagnosis not present

## 2020-12-06 DIAGNOSIS — J301 Allergic rhinitis due to pollen: Secondary | ICD-10-CM | POA: Diagnosis not present

## 2020-12-14 NOTE — Addendum Note (Signed)
Addended by: Mervin Kung A on: 12/14/2020 05:03 PM   Modules accepted: Orders

## 2020-12-15 ENCOUNTER — Other Ambulatory Visit (INDEPENDENT_AMBULATORY_CARE_PROVIDER_SITE_OTHER): Payer: Medicare PPO

## 2020-12-15 ENCOUNTER — Other Ambulatory Visit: Payer: Self-pay

## 2020-12-15 DIAGNOSIS — R7989 Other specified abnormal findings of blood chemistry: Secondary | ICD-10-CM

## 2020-12-15 LAB — COMPREHENSIVE METABOLIC PANEL
ALT: 9 U/L (ref 0–35)
AST: 11 U/L (ref 0–37)
Albumin: 3.7 g/dL (ref 3.5–5.2)
Alkaline Phosphatase: 65 U/L (ref 39–117)
BUN: 13 mg/dL (ref 6–23)
CO2: 30 mEq/L (ref 19–32)
Calcium: 9.2 mg/dL (ref 8.4–10.5)
Chloride: 108 mEq/L (ref 96–112)
Creatinine, Ser: 1.11 mg/dL (ref 0.40–1.20)
GFR: 51.19 mL/min — ABNORMAL LOW (ref >60.00)
Glucose, Bld: 99 mg/dL (ref 70–99)
Potassium: 4.3 mEq/L (ref 3.5–5.1)
Sodium: 144 mEq/L (ref 135–145)
Total Bilirubin: 0.5 mg/dL (ref 0.2–1.2)
Total Protein: 6.5 g/dL (ref 6.0–8.3)

## 2020-12-21 DIAGNOSIS — J301 Allergic rhinitis due to pollen: Secondary | ICD-10-CM | POA: Diagnosis not present

## 2020-12-21 DIAGNOSIS — J3089 Other allergic rhinitis: Secondary | ICD-10-CM | POA: Diagnosis not present

## 2020-12-21 DIAGNOSIS — J3081 Allergic rhinitis due to animal (cat) (dog) hair and dander: Secondary | ICD-10-CM | POA: Diagnosis not present

## 2020-12-29 ENCOUNTER — Other Ambulatory Visit: Payer: Self-pay | Admitting: Internal Medicine

## 2020-12-29 ENCOUNTER — Other Ambulatory Visit: Payer: Self-pay | Admitting: Family Medicine

## 2021-01-03 DIAGNOSIS — J3081 Allergic rhinitis due to animal (cat) (dog) hair and dander: Secondary | ICD-10-CM | POA: Diagnosis not present

## 2021-01-03 DIAGNOSIS — J3089 Other allergic rhinitis: Secondary | ICD-10-CM | POA: Diagnosis not present

## 2021-01-03 DIAGNOSIS — J301 Allergic rhinitis due to pollen: Secondary | ICD-10-CM | POA: Diagnosis not present

## 2021-01-05 ENCOUNTER — Telehealth: Payer: Self-pay

## 2021-01-05 NOTE — Telephone Encounter (Signed)
Spoke with patient regarding appointment scheduled via MyChart "Shortness of breath, swelling of feet".  Patient reports DOE x 2-3 months and occasional lower extremity edema "for years". States she has never mentioned to PCP d/t not occurring at time of office visits.  Patient denies chest pain, jaw pain, diaphoresis, lightheadedness or dizziness. She reports pain in her feet when swelling occurs.  Patient denies change in diet or activity, reports she has increased her water intake.   [place] Has appt with PCP on 01/18/21.  Advised patient to continue water intake, avoid sodium and alcohol. Patient verbalized understanding.  Also advised patient if she begins to experience chest pain, severe SOB or dizziness, to go to local ER.  Patient verbalized understanding.

## 2021-01-15 DIAGNOSIS — Z20822 Contact with and (suspected) exposure to covid-19: Secondary | ICD-10-CM | POA: Diagnosis not present

## 2021-01-18 ENCOUNTER — Telehealth (INDEPENDENT_AMBULATORY_CARE_PROVIDER_SITE_OTHER): Payer: Medicare PPO | Admitting: Family Medicine

## 2021-01-18 ENCOUNTER — Other Ambulatory Visit: Payer: Self-pay

## 2021-01-18 VITALS — Temp 98.6°F

## 2021-01-18 DIAGNOSIS — K219 Gastro-esophageal reflux disease without esophagitis: Secondary | ICD-10-CM

## 2021-01-18 DIAGNOSIS — R739 Hyperglycemia, unspecified: Secondary | ICD-10-CM | POA: Diagnosis not present

## 2021-01-18 DIAGNOSIS — I10 Essential (primary) hypertension: Secondary | ICD-10-CM | POA: Diagnosis not present

## 2021-01-18 DIAGNOSIS — R609 Edema, unspecified: Secondary | ICD-10-CM | POA: Diagnosis not present

## 2021-01-18 DIAGNOSIS — G4733 Obstructive sleep apnea (adult) (pediatric): Secondary | ICD-10-CM

## 2021-01-18 DIAGNOSIS — U071 COVID-19: Secondary | ICD-10-CM | POA: Insufficient documentation

## 2021-01-18 DIAGNOSIS — E782 Mixed hyperlipidemia: Secondary | ICD-10-CM | POA: Diagnosis not present

## 2021-01-18 DIAGNOSIS — R0600 Dyspnea, unspecified: Secondary | ICD-10-CM

## 2021-01-18 DIAGNOSIS — K449 Diaphragmatic hernia without obstruction or gangrene: Secondary | ICD-10-CM

## 2021-01-18 DIAGNOSIS — E669 Obesity, unspecified: Secondary | ICD-10-CM | POA: Diagnosis not present

## 2021-01-18 MED ORDER — PROMETHAZINE-DM 6.25-15 MG/5ML PO SYRP: 2.5000 mL | ORAL_SOLUTION | Freq: Three times a day (TID) | ORAL | 0 refills | Status: DC | PRN

## 2021-01-18 MED ORDER — MOLNUPIRAVIR EUA 200MG CAPSULE: 4.0000 | ORAL_CAPSULE | Freq: Two times a day (BID) | ORAL | 0 refills | Status: AC

## 2021-01-18 NOTE — Patient Instructions (Signed)
COVID-19: Quarantine and Isolation Quarantine If you were exposed Quarantine and stay away from others when you have been in close contact with someone whohas COVID-19. Isolate If you are sick or test positive Isolate when you are sick or when you have COVID-19, even if you don't have symptoms. When to stay home Calculating quarantine The date of your exposure is considered day 0. Day 1 is the first full day after your last contact with a person who has had COVID-19. Stay home and away from other people for at least 5 days. Learn why CDC updated guidance for the general public. IF YOU were exposed to COVID-19 and are NOT up-to-date IF YOU were exposed to COVID-19 and are NOT on COVID-19 vaccinations Quarantine for at least 5 days Stay home Stay home and quarantine for at least 5 full days. Wear a well-fitted mask if you must be around others in your home. Do not travel. Get tested Even if you don't develop symptoms, get tested at least 5 days after you last had close contact with someone with COVID-19. After quarantine Watch for symptoms Watch for symptoms until 10 days after you last had close contact with someone with COVID-19. Avoid travel It is best to avoid travel until a full 10 days after you last had close contact with someone with COVID-19. If you develop symptoms Isolate immediately and get tested. Continue to stay home until you know the results. Wear a well-fitted mask around others. Take precautions until day 10 Wear a mask Wear a well-fitted mask for 10 full days any time you are around others inside your home or in public. Do not go to places where you are unable to wear a mask. If you must travel during days 6-10, take precautions. Avoid being around people who are at high risk IF YOU were exposed to COVID-19 and are up-to-date IF YOU were exposed to COVID-19 and are on COVID-19 vaccinations No quarantine You do not need to stay home unless you develop  symptoms. Get tested Even if you don't develop symptoms, get tested at least 5 days after you last had close contact with someone with COVID-19. Watch for symptoms Watch for symptoms until 10 days after you last had close contact with someone with COVID-19. If you develop symptoms Isolate immediately and get tested. Continue to stay home until you know the results. Wear a well-fitted mask around others. Take precautions until day 10 Wear a mask Wear a well-fitted mask for 10 full days any time you are around others inside your home or in public. Do not go to places where you are unable to wear a mask. Take precautions if traveling Avoid being around people who are at high risk IF YOU were exposed to COVID-19 and had confirmed COVID-19 within the past 90 days (you tested positive using a viral test) No quarantine You do not need to stay home unless you develop symptoms. Watch for symptoms Watch for symptoms until 10 days after you last had close contact with someone with COVID-19. If you develop symptoms Isolate immediately and get tested. Continue to stay home until you know the results. Wear a well-fitted mask around others. Take precautions until day 10 Wear a mask Wear a well-fitted mask for 10 full days any time you are around others inside your home or in public. Do not go to places where you are unable to wear a mask. Take precautions if traveling Avoid being around people who are at high risk Calculating isolation   Day 0 is your first day of symptoms or a positive viral test. Day 1 is the first full day after your symptoms developed or your test specimen was collected. If you have COVID-19 or have symptoms, isolate for at least 5 days. IF YOU tested positive for COVID-19 or have symptoms, regardless of vaccination status Stay home for at least 5 days Stay home for 5 days and isolate from others in your home. Wear a well-fitted mask if you must be around others in your home. Do not  travel. Ending isolation if you had symptoms End isolation after 5 full days if you are fever-free for 24 hours (without the use of fever-reducing medication) and your symptoms are improving. Ending isolation if you did NOT have symptoms End isolation after at least 5 full days after your positive test. If you were severely ill with COVID-19 or are immunocompromised You should isolate for at least 10 days. Consult your doctor before ending isolation. Take precautions until day 10 Wear a mask Wear a well-fitted mask for 10 full days any time you are around others inside your home or in public. Do not go to places where you are unable to wear a mask. Do not travel Do not travel until a full 10 days after your symptoms started or the date your positive test was taken if you had no symptoms. Avoid being around people who are at high risk Definitions Exposure Contact with someone infected with SARS-CoV-2, the virus that causes COVID-19,in a way that increases the likelihood of getting infected with the virus. Close contact A close contact is someone who was less than 6 feet away from an infected person (laboratory-confirmed or a clinical diagnosis) for a cumulative total of 15 minutes or more over a 24-hour period. For example, three individual 5-minute exposures for a total of 15 minutes. People who are exposed to someone with COVID-19 after they completed at least 5 days of isolation are notconsidered close contacts. Quarantine Quarantine is a strategy used to prevent transmission of COVID-19 by keeping people who have been in close contact with someone with COVID-19 apart from others. Who does not need to quarantine? If you had close contact with someone with COVID-19 and you are in one of the following groups, you do not need to quarantine. You are up to date with your COVID-19 vaccines. You had confirmed COVID-19 within the last 90 days (meaning you tested positive using a viral test). You  should wear a well-fitting mask around others for 10 days from the date of your last close contact with someone with COVID-19 (the date of last close contact is considered day 0). Get tested at least 5 days after you last had close contact with someone with COVID-19. If you test positive or develop COVID-19 symptoms, isolate from other people and follow recommendations in the Isolation section below. If you tested positive for COVID-19 with a viral test within the previous 90 days and subsequently recovered and remain without COVID-19 symptoms, you do not need to quarantine or get tested after close contact. You should wear a well-fitting mask around others for 10 days from the date of your last close contact withsomeone with COVID-19 (the date of last close contact is considered day 0). Who should quarantine? If you come into close contact with someone with COVID-19, you should quarantine if you are not up to date on COVID-19 vaccines. This includes people who are not vaccinated. What to do for quarantine Stay home and away   from other people for at least 5 days (day 0 through day 5) after your last contact with a person who has COVID-19. The date of your exposure is considered day 0. Wear a well-fitting mask when around others at home, if possible. For 10 days after your last close contact with someone with COVID-19, watch for fever (100.4F or greater), cough, shortness of breath, or other COVID-19 symptoms. If you develop symptoms, get tested immediately and isolate until you receive your test results. If you test positive, follow isolation recommendations. If you do not develop symptoms, get tested at least 5 days after you last had close contact with someone with COVID-19. If you test negative, you can leave your home, but continue to wear a well-fitting mask when around others at home and in public until 10 days after your last close contact with someone with COVID-19. If you test positive, you should  isolate for at least 5 days from the date of your positive test (if you do not have symptoms). If you do develop COVID-19 symptoms, isolate for at least 5 days from the date your symptoms began (the date the symptoms started is day 0). Follow recommendations in the isolation section below. If you are unable to get a test 5 days after last close contact with someone with COVID-19, you can leave your home after day 5 if you have been without COVID-19 symptoms throughout the 5-day period. Wear a well-fitting mask for 10 days after your date of last close contact when around others at home and in public. Avoid people who are immunocompromised or at high risk for severe disease, and nursing homes and other high-risk settings, until after at least 10 days. If possible, stay away from people you live with, especially people who are at higher risk for getting very sick from COVID-19, as well as others outside your home throughout the full 10 days after your last close contact with someone with COVID-19. If you are unable to quarantine, you should wear a well-fitting mask for 10 days when around others at home and in public. If you are unable to wear a mask when around others, you should continue to quarantine for 10 days. Avoid people who are immunocompromised or at high risk for severe disease, and nursing homes and other high-risk settings, until after at least 10 days. See additional information about travel. Do not go to places where you are unable to wear a mask, such as restaurants and some gyms, and avoid eating around others at home and at work until after 10 days after your last close contact with someone with COVID-19. After quarantine Watch for symptoms until 10 days after your last close contact with someone with COVID-19. If you have symptoms, isolate immediately and get tested. Quarantine in high-risk congregate settings In certain congregate settings that have high risk of secondary transmission  (such as correctional and detention facilities, homeless shelters, or cruise ships), CDC recommends a 10-day quarantine for residents, regardless of vaccination and booster status. During periods of critical staffing shortages, facilities may consider shortening the quarantine period for staff to ensure continuity of operations. Decisions to shorten quarantine in these settings should be made in consultation with state, local, tribal, or territorial health departments and should take into consideration the context and characteristics of the facility. CDC's setting-specific guidance provides additional recommendations for these settings. Isolation Isolation is used to separate people with confirmed or suspected COVID-19 from those without COVID-19. People who are in isolation should stay   home until it's safe for them to be around others. At home, anyone sick or infected should separate from others, or wear a well-fitting mask when they need to be around others. People in isolation should stay in a specific "sick room" or area and use a separate bathroom if available. Everyone who has presumed or confirmed COVID-19 should stay home and isolate from other people for at least 5 full days (day 0 is the first day of symptoms or the date of the day of the positive viral test for asymptomatic persons). They should wear a mask when around others at home and in public for an additional 5 days. People who are confirmed to have COVID-19 or are showing symptoms of COVID-19 need to isolate regardless of their vaccination status. This includes: People who have a positive viral test for COVID-19, regardless of whether or not they have symptoms. People with symptoms of COVID-19, including people who are awaiting test results or have not been tested. People with symptoms should isolate even if they do not know if they have been in close contact with someone with COVID-19. What to do for isolation Monitor your symptoms. If you  have an emergency warning sign (including trouble breathing), seek emergency medical care immediately. Stay in a separate room from other household members, if possible. Use a separate bathroom, if possible. Take steps to improve ventilation at home, if possible. Avoid contact with other members of the household and pets. Don't share personal household items, like cups, towels, and utensils. Wear a well-fitting mask when you need to be around other people. Learn more about what to do if you are sick and how to notify your contacts. Ending isolation for people who had COVID-19 and had symptoms If you had COVID-19 and had symptoms, isolate for at least 5 days. To calculate your 5-day isolation period, day 0 is your first day of symptoms. Day 1 is the first full day after your symptoms developed. You can leave isolation after 5 full days. You can end isolation after 5 full days if you are fever-free for 24 hours without the use of fever-reducing medication and your other symptoms have improved (Loss of taste and smell may persist for weeks or months after recovery and need not delay the end of isolation). You should continue to wear a well-fitting mask around others at home and in public for 5 additional days (day 6 through day 10) after the end of your 5-day isolation period. If you are unable to wear a mask when around others, you should continue to isolate for a full 10 days. Avoid people who are immunocompromised or at high risk for severe disease, and nursing homes and other high-risk settings, until after at least 10 days. If you continue to have fever or your other symptoms have not improved after 5 days of isolation, you should wait to end your isolation until you are fever-free for 24 hours without the use of fever-reducing medication and your other symptoms have improved. Continue to wear a well-fitting mask. Contact your healthcare provider if you have questions. See additional information about  travel. Do not go to places where you are unable to wear a mask, such as restaurants and some gyms, and avoid eating around others at home and at work until a full 10 days after your first day of symptoms. If an individual has access to a test and wants to test, the best approach is to use an antigen test1 towards the end of   the 5-day isolation period. Collect the test sample only if you are fever-free for 24 hours without the use of fever-reducing medication and your other symptoms have improved (loss of taste and smell may persist for weeks or months after recovery and need not delay the end of isolation). If your test result is positive, you should continue to isolate until day 10. If your test result is negative, you can end isolation, but continue to wear a well-fitting mask around others at home and in public until day 10. Follow additional recommendations for masking and avoiding travel as described above. 1As noted in the labeling for authorized over-the counter antigen tests: Negative results should be treated as presumptive. Negative results do not rule out SARS-CoV-2 infection and should not be used as the sole basis for treatment or patient management decisions, including infection control decisions. To improve results, antigen tests should be used twice over a three-day period with at least 24 hours and no more than 48 hours between tests. Note that these recommendations on ending isolation do not apply to people with moderate or severe COVID-19 or with weakened immune systems (immunocompromised). See section below for recommendations for when toend isolation for these groups. Ending isolation for people who tested positive for COVID-19 but had no symptoms If you test positive for COVID-19 and never develop symptoms, isolate for at least 5 days. Day 0 is the day of your positive viral test (based on the date you were tested) and day 1 is the first full day after the specimen was collected for your  positive test. You can leave isolation after 5 full days. If you continue to have no symptoms, you can end isolation after at least 5 days. You should continue to wear a well-fitting mask around others at home and in public until day 10 (day 6 through day 10). If you are unable to wear a mask when around others, you should continue to isolate for 10 days. Avoid people who are immunocompromised or at high risk for severe disease, and nursing homes and other high-risk settings, until after at least 10 days. If you develop symptoms after testing positive, your 5-day isolation period should start over. Day 0 is your first day of symptoms. Follow the recommendations above for ending isolation for people who had COVID-19 and had symptoms. See additional information about travel. Do not go to places where you are unable to wear a mask, such as restaurants and some gyms, and avoid eating around others at home and at work until 10 days after the day of your positive test. If an individual has access to a test and wants to test, the best approach is to use an antigen test1 towards the end of the 5-day isolation period. If your test result is positive, you should continue to isolate until day 10. If your test result is negative, you can end isolation, but continue to wear a well-fitting mask around others at home and in public until day 10. Follow additionalrecommendations for masking and avoiding travel as described above. 1As noted in the labeling for authorized over-the counter antigen tests: Negative results should be treated as presumptive. Negative results do not rule out SARS-CoV-2 infection and should not be used as the sole basis for treatment or patient management decisions, including infection control decisions. To improve results, antigen tests should be used twice over a three-day period with at least 24 hours and no more than 48 hours between tests. Ending isolation for people who were   severely ill with  COVID-19 or have a weakened immune system (immunocompromised) People who are severely ill with COVID-19 (including those who were hospitalized or required intensive care or ventilation support) and people with compromised immune systems might need to isolate at home longer. They may also require testing with a viral test to determine when they can be around others. CDC recommends an isolation period of at least 10 and up to 20 days for people who were severely ill with COVID-19 and for people with weakened immune systems. Consult with your healthcare provider about when you can resume being aroundother people. People who are immunocompromised should talk to their healthcare provider about the potential for reduced immune responses to COVID-19 vaccines and the need to continue to follow current prevention measures (including wearing a well-fitting mask, staying 6 feet apart from others they don't live with, and avoiding crowds and poorly ventilated indoor spaces) to protect themselves against COVID-19 until advised otherwise by their healthcare provider. Close contacts of immunocompromised people--including household members--should also be encouraged to receive all recommended COVID-19 vaccine doses to help protect these people. Isolation in high-risk congregate settings In certain high-risk congregate settings that have high risk of secondary transmission and where it is not feasible to cohort people (such as correctional and detention facilities, homeless shelters, and cruise ships), CDC recommends a 10-day isolation period for residents. During periods of critical staffing shortages, facilities may consider shortening the isolation period for staff to ensure continuity of operations. Decisions to shorten isolation in these settings should be made in consultation with state, local, tribal, or territorial health departments and should take into consideration the context and characteristics of the facility.  CDC's setting-specific guidance provides additional recommendations for these settings. This CDC guidance is meant to supplement--not replace--any federal, state, local,territorial, or tribal health and safety laws, rules, and regulations. Recommendations for specific settings These recommendations do not apply to healthcare professionals. For guidance specific to these settings, see Healthcare professionals: Interim Guidance for Managing Healthcare Personnel with SARS-CoV-2 Infection or Exposure to SARS-CoV-2 Patients, residents, and visitors to healthcare settings: Interim Infection Prevention and Control Recommendations for Healthcare Personnel During the Coronavirus Disease 2019 (COVID-19) Pandemic Additional setting-specific guidance and recommendations are available. These recommendations on quarantine and isolation do apply to K-12 School settings. Additional guidance is available here: Overview of COVID-19 Quarantine for K-12 Schools Travelers: Travel information and recommendations Congregate facilities and other settings: guidance pages for community, work, and school settings Ongoing COVID-19 exposure FAQs I live with someone with COVID-19, but I cannot be separated from them. How do we manage quarantine in this situation? It is very important for people with COVID-19 to remain apart from other people, if possible, even if they are living together. If separation of the person with COVID-19 from others that they live with is not possible, the other people that they live with will have ongoing exposure, meaning they will be repeatedly exposed until that person is no longer able to spread the virus to other people. In this situation, there are precautions you can take to limit the spread of COVID-19: The person with COVID-19 and everyone they live with should wear a well-fitting mask inside the home. If possible, one person should care for the person with COVID-19 to limit the number of people  who are in close contact with the infected person. Take steps to protect yourself and others to reduce transmission in the home: Quarantine if you are not up to date with your COVID-19   vaccines. Isolate if you are sick or tested positive for COVID-19, even if you don't have symptoms. Learn more about the public health recommendations for testing, mask use and quarantine of close contacts, like yourself, who have ongoing exposure. These recommendations differ depending on your vaccination status. What should I do if I have ongoing exposure to COVID-19 from someone I live with? Recommendations for this situation depend on your vaccination status: If you are not up to date on COVID-19 vaccines and have ongoing exposure to COVID-19, you should: Begin quarantine immediately and continue to quarantine throughout the isolation period of the person with COVID-19. Continue to quarantine for an additional 5 days starting the day after the end of isolation for the person with COVID-19. Get tested at least 5 days after the end of isolation of the infected person that lives with them. If you test negative, you can leave the home but should continue to wear a well-fitting mask when around others at home and in public until 10 days after the end of isolation for the person with COVID-19. Isolate immediately if you develop symptoms of COVID-19 or test positive. If you are up to date with COVID-19 vaccines and have ongoing exposure to COVID-19, you should: Get tested at least 5 days after your first exposure. A person with COVID-19 is considered infectious starting 2 days before they develop symptoms, or 2 days before the date of their positive test if they do not have symptoms. Get tested again at least 5 days after the end of isolation for the person with COVID-19. Wear a well-fitting mask when you are around the person with COVID-19, and do this throughout their isolation period. Wear a well-fitting mask around  others for 10 days after the infected person's isolation period ends. Isolate immediately if you develop symptoms of COVID-19 or test positive. What should I do if multiple people I live with test positive for COVID-19 at different times? Recommendations for this situation depend on your vaccination status: If you are not up to date with your COVID-19 vaccines, you should: Quarantine throughout the isolation period of any infected person that you live with. Continue to quarantine until 5 days after the end of isolation date for the most recently infected person that lives with you. For example, if the last day of isolation of the person most recently infected with COVID-19 was June 30, the new 5-day quarantine period starts on July 1. Get tested at least 5 days after the end of isolation for the most recently infected person that lives with you. Wear a well-fitting mask when you are around any person with COVID-19 while that person is in isolation. Wear a well-fitting mask when you are around other people until 10 days after your last close contact. Isolate immediately if you develop symptoms of COVID-19 or test positive. If you are up to date with COVID-19 your vaccines , you should: Get tested at least 5 days after your first exposure. A person with COVID-19 is considered infectious starting 2 days before they developed symptoms, or 2 days before the date of their positive test if they do not have symptoms. Get tested again at least 5 days after the end of isolation for the most recently infected person that lives with you. Wear a well-fitting mask when you are around any person with COVID-19 while that person is in isolation. Wear a well-fitting mask around others for 10 days after the end of isolation for the most recently infected person that   lives with you. For example, if the last day of isolation for the person most recently infected with COVID-19 was June 30, the new 10-day period to wear a  well-fitting mask indoors in public starts on July 1. Isolate immediately if you develop symptoms of COVID-19 or test positive. I had COVID-19 and completed isolation. Do I have to quarantine or get tested if someone I live with gets COVID-19 shortly after I completed isolation? No. If you recently completed isolation and someone that lives with you tests positive for the virus that causes COVID-19 shortly after the end of your isolation period, you do not have to quarantine or get tested as long as you do not develop new symptoms. Once all of the people that live together have completed isolation or quarantine, refer to the guidance below for new exposures to COVID-19. If you had COVID-19 in the previous 90 days and then came into close contact with someone with COVID-19, you do not have to quarantine or get tested if you do not have symptoms. But you should: Wear a well-fitting mask indoors in public for 10 days after exposure. Monitor for COVID-19 symptoms and isolate immediately if symptoms develop. Consult with a healthcare provider for testing recommendations if new symptoms develop. If more than 90 days have passed since your recovery from infection, follow CDC's recommendations for close contacts. These recommendations will differ depending on your vaccination status. 06/29/2020 Content source: National Center for Immunization and Respiratory Diseases (NCIRD), Division of Viral Diseases This information is not intended to replace advice given to you by your health care provider. Make sure you discuss any questions you have with your healthcare provider. Document Revised: 07/07/2020 Document Reviewed: 07/07/2020 Elsevier Patient Education  2022 Elsevier Inc.  

## 2021-01-18 NOTE — Progress Notes (Addendum)
MyChart Video Visit    Virtual Visit via Video Note   This visit type was conducted due to national recommendations for restrictions regarding the COVID-19 Pandemic (e.g. social distancing) in an effort to limit this patient's exposure and mitigate transmission in our community. This patient is at least at moderate risk for complications without adequate follow up. This format is felt to be most appropriate for this patient at this time. Physical exam was limited by quality of the video and audio technology used for the visit. S Chism, CMA was able to get the patient set up on a video visit.  Patient location: home Patient and provider in visit Provider location: Office  I discussed the limitations of evaluation and management by telemedicine and the availability of in person appointments. The patient expressed understanding and agreed to proceed.  Visit Date: 01/18/2021  Today's healthcare provider: Danise Edge, MD     Subjective:    [name], female    DOB: November 24, 1950, 70 y.o.   MRN: [id number]  Chief Complaint  Patient presents with   Covid Positive     HPI  Patient is in today for a video visit for testing positive for COVID-19. She states that her granddaughter had it and as a result she tested for COVID-19 on Thursday and results were negative. However on Friday she started feeling sick and having chills, but no fever. She has had a runny nose on Monday and on Saturday she started developing coughs. At the moment, she is experiencing HA's. Denies CP/palp/fevers/GI or GU c/o. Taking meds as prescribed.  She reports that since increasing water intake as instructed, she noticed increased edema in bilateral LE's.      Past Medical History:  Diagnosis Date   Allergic rhinitis    Apnea, sleep 06/28/2012   Previously used CPAP   Asthma    Atypical chest pain 01/14/2017   Back pain    Decreased visual acuity 08/05/2016   Depression 06/19/2015    Environmental and seasonal allergies    GERD (gastroesophageal reflux disease)    Heartburn    Hip pain, right 06/28/2012   HTN (hypertension)    Hypokalemia 01/14/2017   Leg pain    Morbid obesity (HCC) 04/11/2008   Qualifier: Diagnosis of  By: Artist Pais DO, Barbette Hair    Numbness in feet 05/06/2016   Obesity    Osteoarthritis 01/16/2010   Qualifier: Diagnosis of  By: [name]    Preventative health care 06/25/2015   Shortness of breath    Shortness of breath on exertion    Sleep apnea    Swelling of extremity    Tinnitus    Vertigo     Past Surgical History:  Procedure Laterality Date   BIOPSY BREAST  1997   left    childbirth     COLONOSCOPY  2004   UPPER GASTROINTESTINAL ENDOSCOPY  03/01/2019    Family History  Problem Relation Age of Onset   Stroke Mother    Liver disease Mother        hepatitis b   Hypertension Mother    Cancer Father        esophagus, prostate   Hypertension Father    Heart disease Father    Esophageal cancer Father    Liver disease Brother        hep c   Mental illness Brother        PTSD from [place]   Stroke Maternal  Grandmother    Cancer Paternal Grandfather        prostate   Colitis Neg Hx    Colon cancer Neg Hx    Rectal cancer Neg Hx    Stomach cancer Neg Hx     Social History   Socioeconomic History   Marital status: Divorced    Spouse name: Not on file   Number of children: 1   Years of education: Not on file   Highest education level: Not on file  Occupational History   Occupation: HS music teacher- [employer]  Tobacco Use   Smoking status: Never   Smokeless tobacco: Never  Vaping Use   Vaping Use: Never used  Substance and Sexual Activity   Alcohol use: Yes    Comment: social   Drug use: No   Sexual activity: Not on file    Comment: band teacher, 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 no dietary restrictions  Other Topics Concern   Not on file  Social History Narrative   Single/divorced   1 son born [id number] - sometimes  there w/ her, leukemia 2016   [place] [employer]   Caffeine use 1 cup AM   Never smoker/tobacco   No drugs   Rare-occ EtOHSmoking    Social Determinants of Health   Financial Resource Strain: Not on file  Food Insecurity: Not on file  Transportation Needs: Not on file  Physical Activity: Not on file  Stress: Not on file  Social Connections: Not on file  Intimate Partner Violence: Not on file    Outpatient Medications Prior to Visit  Medication Sig Dispense Refill   albuterol (VENTOLIN HFA) 108 (90 Base) MCG/ACT inhaler Inhale 1-2 puffs into the lungs every 6 (six) hours as needed for wheezing or shortness of breath. 18 g 5   amLODipine-olmesartan (AZOR) 10-40 MG tablet TAKE 1 TABLET BY MOUTH EVERY DAY 90 tablet 1   DEXILANT 60 MG capsule TAKE 1 CAPSULE (60 MG TOTAL) BY MOUTH DAILY BEFORE BREAKFAST. NEEDS APPOINTMENT FOR FURTHER REFILLS 90 capsule 0   EPINEPHrine 0.3 mg/0.3 mL IJ SOAJ injection Inject 1 Dose into the muscle as needed for allergies. Inject after allergy shot for anaphylaxis     famotidine (PEPCID) 40 MG tablet Take 1 tablet (40 mg total) by mouth daily. 90 tablet 3   fluticasone (FLONASE) 50 MCG/ACT nasal spray Place 1 spray into both nostrils daily as needed.     fluticasone (FLOVENT HFA) 220 MCG/ACT inhaler Inhale 1 puff into the lungs in the morning and at bedtime. 1 Inhaler 12   levocetirizine (XYZAL) 5 MG tablet Take 5 mg by mouth every evening.     montelukast (SINGULAIR) 10 MG tablet TAKE 1 TABLET BY MOUTH EVERYDAY AT BEDTIME 90 tablet 1   oxybutynin (DITROPAN-XL) 10 MG 24 hr tablet Take 10 mg by mouth daily.      No facility-administered medications prior to visit.    Allergies  Allergen Reactions   Sulfonamide Derivatives    Sulfa Antibiotics Rash    Review of Systems  Constitutional:  Positive for chills. Negative for fever and malaise/fatigue.  HENT:  Negative for congestion, sinus pain and sore throat.   Eyes:  Negative for blurred  vision.  Respiratory:  Positive for cough and shortness of breath.   Cardiovascular:  Negative for chest pain, palpitations and leg swelling.  Gastrointestinal:  Negative for blood in stool, diarrhea, nausea and vomiting.  Genitourinary:  Negative for flank pain and frequency.  Musculoskeletal:  Negative for back pain.  Skin:  Negative for rash.  Neurological:  Positive for headaches.      Objective:    Physical Exam Constitutional:      Appearance: Normal appearance.  HENT:     Head: Normocephalic and atraumatic.     Right Ear: External ear normal.     Left Ear: External ear normal.  Pulmonary:     Effort: Pulmonary effort is normal.  Musculoskeletal:        General: Normal range of motion.     Cervical back: Normal range of motion.  Skin:    General: Skin is dry.  Neurological:     Mental Status: She is alert and oriented to person, place, and time.  Psychiatric:        Behavior: Behavior normal.    Temp 98.6 F (37 C)  Wt Readings from Last 3 Encounters:  11/28/20 271 lb (122.9 kg)  11/02/20 272 lb 6.4 oz (123.6 kg)  08/01/20 276 lb (125.2 kg)    Diabetic Foot Exam - Simple   No data filed    Lab Results  Component Value Date   WBC 10.4 11/28/2020   HGB 11.1 (L) 11/28/2020   HCT 34.9 (L) 11/28/2020   PLT 408.0 (H) 11/28/2020   GLUCOSE 99 12/15/2020   CHOL 145 11/28/2020   TRIG 83.0 11/28/2020   HDL 41.30 11/28/2020   LDLCALC 87 11/28/2020   ALT 9 12/15/2020   AST 11 12/15/2020   NA 144 12/15/2020   K 4.3 12/15/2020   CL 108 12/15/2020   CREATININE 1.11 12/15/2020   BUN 13 12/15/2020   CO2 30 12/15/2020   TSH 2.58 11/28/2020   HGBA1C 5.8 11/28/2020    Lab Results  Component Value Date   TSH 2.58 11/28/2020   Lab Results  Component Value Date   WBC 10.4 11/28/2020   HGB 11.1 (L) 11/28/2020   HCT 34.9 (L) 11/28/2020   MCV 89.0 11/28/2020   PLT 408.0 (H) 11/28/2020   Lab Results  Component Value Date   NA 144 12/15/2020   K 4.3  12/15/2020   CO2 30 12/15/2020   GLUCOSE 99 12/15/2020   BUN 13 12/15/2020   CREATININE 1.11 12/15/2020   BILITOT 0.5 12/15/2020   ALKPHOS 65 12/15/2020   AST 11 12/15/2020   ALT 9 12/15/2020   PROT 6.5 12/15/2020   ALBUMIN 3.7 12/15/2020   CALCIUM 9.2 12/15/2020   ANIONGAP 7 03/19/2019   GFR 51.19 (L) 12/15/2020   Lab Results  Component Value Date   CHOL 145 11/28/2020   Lab Results  Component Value Date   HDL 41.30 11/28/2020   Lab Results  Component Value Date   LDLCALC 87 11/28/2020   Lab Results  Component Value Date   TRIG 83.0 11/28/2020   Lab Results  Component Value Date   CHOLHDL 4 11/28/2020   Lab Results  Component Value Date   HGBA1C 5.8 11/28/2020       Assessment & Plan:   Problem List Items Addressed This Visit     Hyperlipidemia, mixed   Relevant Orders   ECHOCARDIOGRAM COMPLETE   Ambulatory referral to Cardiology   Obesity   Relevant Orders   Ambulatory referral to Cardiology   Essential hypertension    Monitor and report any concerns. no changes to meds. Encouraged heart healthy diet such as the DASH diet and exercise as tolerated.       Relevant Orders   ECHOCARDIOGRAM COMPLETE   Ambulatory  referral to Cardiology   Gastroesophageal reflux disease with hiatal hernia    Avoid offending foods, start probiotics. Do not eat large meals in late evening and consider raising head of bed.       EDEMA    Elevate feet above heart for at least 15 minutes tid. Try compression hose, minimize salt      OSA (obstructive sleep apnea)   Relevant Orders   Ambulatory referral to Cardiology   Hyperglycemia    hgba1c acceptable, minimize simple carbs. Increase exercise as tolerated.       Relevant Orders   Ambulatory referral to Cardiology   COVID-19    She believes she got COVID from her granddaughter. Her symptoms began on Saturday. She is started on Molnupiravir and Promethazine DM cough syrup to use prn.       Relevant Medications    molnupiravir EUA 200 mg CAPS   Dyspnea - Primary    Worsening, most notable with exertion. Referred to cardiology for further evaluation. An Echo is ordered.       Relevant Orders   ECHOCARDIOGRAM COMPLETE   Ambulatory referral to Cardiology     Meds ordered this encounter  Medications   molnupiravir EUA 200 mg CAPS    Sig: Take 4 capsules (800 mg total) by mouth 2 (two) times daily for 5 days.    Dispense:  40 capsule    Refill:  0   promethazine-dextromethorphan (PROMETHAZINE-DM) 6.25-15 MG/5ML syrup    Sig: Take 2.5-5 mLs by mouth 3 (three) times daily as needed for cough.    Dispense:  240 mL    Refill:  0     I discussed the assessment and treatment plan with the patient. The patient was provided an opportunity to ask questions and all were answered. The patient agreed with the plan and demonstrated an understanding of the instructions.   The patient was advised to call back or seek an in-person evaluation if the symptoms worsen or if the condition fails to improve as anticipated.  I provided 22 minutes of face-to-face time during this encounter.   Danise Edge, MD [facility] [phone] (phone) [phone] (fax)  [facility]   I, Billie Lade, acting as a scribe for Danise Edge, MD, have documented all relevent documentation on behalf of Danise Edge, MD, as directed by Danise Edge, MD while in the presence of Danise Edge, MD.  I, Bradd Canary, MD personally performed the services described in this documentation. All medical record entries made by the scribe were at my direction and in my presence. I have reviewed the chart and agree that the record reflects my personal performance and is accurate and complete

## 2021-01-20 DIAGNOSIS — R0600 Dyspnea, unspecified: Secondary | ICD-10-CM | POA: Insufficient documentation

## 2021-01-20 DIAGNOSIS — R0609 Other forms of dyspnea: Secondary | ICD-10-CM | POA: Insufficient documentation

## 2021-01-20 NOTE — Assessment & Plan Note (Signed)
She believes she got COVID from her granddaughter. Her symptoms began on Saturday. She is started on Molnupiravir and Promethazine DM cough syrup to use prn.

## 2021-01-20 NOTE — Assessment & Plan Note (Signed)
Worsening, most notable with exertion. Referred to cardiology for further evaluation. An Echo is ordered.

## 2021-01-20 NOTE — Assessment & Plan Note (Signed)
hgba1c acceptable, minimize simple carbs. Increase exercise as tolerated.  

## 2021-01-20 NOTE — Assessment & Plan Note (Signed)
Avoid offending foods, start probiotics. Do not eat large meals in late evening and consider raising head of bed.  

## 2021-01-20 NOTE — Assessment & Plan Note (Signed)
Elevate feet above heart for at least 15 minutes tid. Try compression hose, minimize salt

## 2021-01-20 NOTE — Assessment & Plan Note (Addendum)
Monitor and report any concerns. no changes to meds. Encouraged heart healthy diet such as the DASH diet and exercise as tolerated.  

## 2021-01-24 DIAGNOSIS — J301 Allergic rhinitis due to pollen: Secondary | ICD-10-CM | POA: Diagnosis not present

## 2021-01-24 DIAGNOSIS — J3089 Other allergic rhinitis: Secondary | ICD-10-CM | POA: Diagnosis not present

## 2021-01-24 DIAGNOSIS — J3081 Allergic rhinitis due to animal (cat) (dog) hair and dander: Secondary | ICD-10-CM | POA: Diagnosis not present

## 2021-01-30 ENCOUNTER — Other Ambulatory Visit: Payer: Self-pay | Admitting: Family Medicine

## 2021-01-30 DIAGNOSIS — R0600 Dyspnea, unspecified: Secondary | ICD-10-CM

## 2021-01-31 ENCOUNTER — Other Ambulatory Visit: Payer: Self-pay

## 2021-01-31 ENCOUNTER — Ambulatory Visit (HOSPITAL_BASED_OUTPATIENT_CLINIC_OR_DEPARTMENT_OTHER)
Admission: RE | Admit: 2021-01-31 | Discharge: 2021-01-31 | Disposition: A | Discharge status: Home / Self Care | Payer: Medicare PPO | Source: Ambulatory Visit | Attending: Family Medicine | Admitting: Family Medicine

## 2021-01-31 DIAGNOSIS — R0609 Other forms of dyspnea: Secondary | ICD-10-CM | POA: Diagnosis not present

## 2021-01-31 DIAGNOSIS — R0600 Dyspnea, unspecified: Secondary | ICD-10-CM | POA: Insufficient documentation

## 2021-01-31 DIAGNOSIS — J3089 Other allergic rhinitis: Secondary | ICD-10-CM | POA: Diagnosis not present

## 2021-01-31 DIAGNOSIS — J3081 Allergic rhinitis due to animal (cat) (dog) hair and dander: Secondary | ICD-10-CM | POA: Diagnosis not present

## 2021-01-31 DIAGNOSIS — J301 Allergic rhinitis due to pollen: Secondary | ICD-10-CM | POA: Diagnosis not present

## 2021-01-31 LAB — ECHOCARDIOGRAM COMPLETE
AR max vel: 2.27 cm2
AV Area VTI: 2.13 cm2
AV Area mean vel: 2.36 cm2
AV Mean grad: 6 mmHg
AV Peak grad: 9.9 mmHg
Ao pk vel: 1.57 m/s
Area-P 1/2: 3.05 cm2
Calc EF: 50.6 %
S' Lateral: 3.44 cm
Single Plane A2C EF: 51 %
Single Plane A4C EF: 52.5 %

## 2021-02-06 ENCOUNTER — Other Ambulatory Visit: Payer: Self-pay | Admitting: Family Medicine

## 2021-02-16 DIAGNOSIS — J301 Allergic rhinitis due to pollen: Secondary | ICD-10-CM | POA: Diagnosis not present

## 2021-02-16 DIAGNOSIS — J3081 Allergic rhinitis due to animal (cat) (dog) hair and dander: Secondary | ICD-10-CM | POA: Diagnosis not present

## 2021-02-16 DIAGNOSIS — J3089 Other allergic rhinitis: Secondary | ICD-10-CM | POA: Diagnosis not present

## 2021-02-23 DIAGNOSIS — H1045 Other chronic allergic conjunctivitis: Secondary | ICD-10-CM | POA: Diagnosis not present

## 2021-02-23 DIAGNOSIS — J4520 Mild intermittent asthma, uncomplicated: Secondary | ICD-10-CM | POA: Diagnosis not present

## 2021-02-23 DIAGNOSIS — J3089 Other allergic rhinitis: Secondary | ICD-10-CM | POA: Diagnosis not present

## 2021-02-23 DIAGNOSIS — J301 Allergic rhinitis due to pollen: Secondary | ICD-10-CM | POA: Diagnosis not present

## 2021-03-02 ENCOUNTER — Other Ambulatory Visit: Payer: Self-pay | Admitting: Internal Medicine

## 2021-03-03 NOTE — Progress Notes (Signed)
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 Medical problem list includes HTN, GERD, Asthma, Allergic Rhinitis, Morbid Obesity, HYperlipidemia,  -Singulair, Flovent 220 hfa, flonase, Ventolin hfa,  NPSG 06/22/2017-[facility] ([facility]) AHI 15.9/ hr, desaturation to 71%, body weight 273 lbs, CPAP to 7. Epworth score-2 Body weight today-272 lbs Covid vax- -----Pt is being referred by PCP for sleep consult.  Pt states that she has been told that she does snore. States she did have a sleep study about 3 years ago. No longer using old CPAP machine- she didn't like mask on face.  Sleeps alone- no reporter about snore or witnessed apnea. Occ Health evaluator told he she needed to update her sleep study. No sleep meds and very little caffeine. Minimizes daytime sleepiness. On allergy vaccine- [place]. GERD disturbs sleep- on med.   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  We discussed treatment alternatives and purpose. She would like to explore a fitted oral appliance and asks for referral. We will see who can take m'caid. Again emphasized her responsibility for safe  personal driving and importance of weight loss.  No recent asthma exacerbation. Occ use of rescue inhaler.   ROS-see HPI   + = positive Constitutional:    weight loss, night sweats, fevers, chills, fatigue, lassitude. HEENT:    headaches, difficulty swallowing, tooth/dental problems, sore throat,       sneezing, itching, ear ache, +nasal congestion, post nasal drip, snoring CV:    chest pain, orthopnea, PND, +swelling in lower extremities, anasarca,                                   dizziness, palpitations Resp:   shortness of breath with exertion or at rest.                productive cough,   +non-productive cough, coughing up of blood.              change in color of mucus.  wheezing.   Skin:    rash or lesions. GI:  +heartburn, indigestion, abdominal pain, nausea, vomiting, diarrhea,                 change in bowel habits, loss of appetite GU: dysuria, change in color of urine, no urgency or frequency.   flank pain. MS:   joint pain, stiffness, decreased range of motion, back pain. Neuro-     nothing unusual Psych:  change in mood or affect.  depression or anxiety.   memory loss.  OBJ- Physical Exam General- Alert, Oriented, Affect-appropriate, Distress- none acute, + obese Skin- rash-none, lesions- none, excoriation- none Lymphadenopathy- none Head- atraumatic            Eyes- Gross vision intact, PERRLA,  conjunctivae and secretions clear            Ears- Hearing, canals-normal            Nose- Clear, no-Septal dev, mucus, polyps, erosion, perforation             Throat- Mallampati IV , mucosa clear , drainage- none, tonsils- atrophic, + teeth Neck- flexible , trachea midline, no stridor , thyroid nl, carotid no bruit Chest - symmetrical excursion , unlabored           Heart/CV- RRR , no murmur , no gallop  , no rub, nl s1 s2                           - JVD- none , edema- none, stasis changes- none, varices- none           Lung- clear to P&A, wheeze+trace, cough- none ,  dullness-none, rub- none           Chest wall-  Abd-  Br/ Gen/ Rectal- Not done, not indicated Extrem- cyanosis- none, clubbing, none, atrophy- none, strength- nl Neuro- grossly intact to observation

## 2021-03-05 ENCOUNTER — Encounter: Payer: Self-pay | Admitting: Internal Medicine

## 2021-03-05 ENCOUNTER — Other Ambulatory Visit: Payer: Self-pay

## 2021-03-05 ENCOUNTER — Ambulatory Visit: Payer: Medicare PPO | Admitting: Internal Medicine

## 2021-03-05 VITALS — BP 124/66 | HR 70 | Temp 97.8°F | Ht 64.5 in | Wt 273.6 lb

## 2021-03-05 DIAGNOSIS — J4520 Mild intermittent asthma, uncomplicated: Secondary | ICD-10-CM

## 2021-03-05 DIAGNOSIS — G4733 Obstructive sleep apnea (adult) (pediatric): Secondary | ICD-10-CM

## 2021-03-05 NOTE — Patient Instructions (Signed)
Order- refer to Dr Irene Limbo, DDS  consider oral appliance for obstructive sleep apnea Please call if we can help

## 2021-03-06 DIAGNOSIS — H1045 Other chronic allergic conjunctivitis: Secondary | ICD-10-CM | POA: Insufficient documentation

## 2021-03-06 DIAGNOSIS — J3081 Allergic rhinitis due to animal (cat) (dog) hair and dander: Secondary | ICD-10-CM | POA: Insufficient documentation

## 2021-03-06 DIAGNOSIS — M7989 Other specified soft tissue disorders: Secondary | ICD-10-CM | POA: Insufficient documentation

## 2021-03-06 DIAGNOSIS — J301 Allergic rhinitis due to pollen: Secondary | ICD-10-CM | POA: Insufficient documentation

## 2021-03-06 DIAGNOSIS — J4520 Mild intermittent asthma, uncomplicated: Secondary | ICD-10-CM | POA: Insufficient documentation

## 2021-03-06 DIAGNOSIS — J3089 Other allergic rhinitis: Secondary | ICD-10-CM | POA: Insufficient documentation

## 2021-03-07 ENCOUNTER — Other Ambulatory Visit: Payer: Self-pay

## 2021-03-07 ENCOUNTER — Encounter: Payer: Self-pay | Admitting: Cardiology

## 2021-03-07 ENCOUNTER — Ambulatory Visit: Payer: Medicare PPO | Admitting: Cardiology

## 2021-03-07 ENCOUNTER — Encounter: Payer: Self-pay | Admitting: Internal Medicine

## 2021-03-07 VITALS — BP 120/71 | HR 72 | Ht 64.5 in | Wt 273.0 lb

## 2021-03-07 DIAGNOSIS — R0609 Other forms of dyspnea: Secondary | ICD-10-CM | POA: Diagnosis not present

## 2021-03-07 DIAGNOSIS — R079 Chest pain, unspecified: Secondary | ICD-10-CM

## 2021-03-07 DIAGNOSIS — I10 Essential (primary) hypertension: Secondary | ICD-10-CM

## 2021-03-07 DIAGNOSIS — E669 Obesity, unspecified: Secondary | ICD-10-CM | POA: Diagnosis not present

## 2021-03-07 DIAGNOSIS — G4733 Obstructive sleep apnea (adult) (pediatric): Secondary | ICD-10-CM | POA: Diagnosis not present

## 2021-03-07 DIAGNOSIS — E8881 Metabolic syndrome: Secondary | ICD-10-CM | POA: Diagnosis not present

## 2021-03-07 DIAGNOSIS — E88819 Insulin resistance, unspecified: Secondary | ICD-10-CM

## 2021-03-07 MED ORDER — METOPROLOL TARTRATE 100 MG PO TABS: 100.0000 mg | ORAL_TABLET | Freq: Once | ORAL | 0 refills | Status: DC

## 2021-03-07 MED ORDER — FUROSEMIDE 20 MG PO TABS: 20.0000 mg | ORAL_TABLET | Freq: Every day | ORAL | 1 refills | Status: DC

## 2021-03-07 NOTE — Assessment & Plan Note (Signed)
Mild intermittent uncomplicated 

## 2021-03-07 NOTE — Progress Notes (Signed)
Cardiology Consultation:    Date:  03/07/2021   ID:  KHYLEE ALGEO, DOB 1951-03-29, MRN [id number]  PCP:  Bradd Canary, MD  Cardiologist:  Gypsy Balsam, MD   Referring MD: Bradd Canary, MD   Chief Complaint  Patient presents with   Shortness of Breath    History of Present Illness:    Terri [name] is a 70 y.o. female who is being seen today for the evaluation of dyspnea on exertion at the request of Bradd Canary, MD. past medical history significant for obesity, obstructive sleep apnea recently recognized she was referred to [name] because of dyspnea on exertion that been progressively getting worse over the last few months.  Sometimes any effort will bring significant shortness of breath to the point that she has to stop.  She does not have any chest pain tightness squeezing pressure burning chest shortness of breath.  She also noticed some swelling of lower extremities.  Recent echocardiogram performed showed lower limits of normal ejection fraction which is in contrast to the prior EKG done few years ago and her ejection fraction was 60 to 65%.  She snores at night recently she was recognized to have sleep apnea and treatment has been initiated.  She never smoked does have fairly decent cholesterol however her family history had history of CVA at early age.  She does not exercise on the regular basis she is not on  special diet.  Past Medical History:  Diagnosis Date   Allergic rhinitis    Apnea, sleep 06/28/2012   Previously used CPAP   Asthma    Atypical chest pain 01/14/2017   Back pain    Decreased visual acuity 08/05/2016   Depression 06/19/2015   Environmental and seasonal allergies    GERD (gastroesophageal reflux disease)    Heartburn    Hip pain, right 06/28/2012   HTN (hypertension)    Hypokalemia 01/14/2017   Leg pain    Morbid obesity (HCC) 04/11/2008   Qualifier: Diagnosis of  By: [name]    Numbness in feet 05/06/2016   Obesity     Osteoarthritis 01/16/2010   Qualifier: Diagnosis of  By: [name]    Preventative health care 06/25/2015   Shortness of breath    Shortness of breath on exertion    Sleep apnea    Swelling of extremity    Tinnitus    Vertigo     Past Surgical History:  Procedure Laterality Date   BIOPSY BREAST  1997   left    childbirth     COLONOSCOPY  2004   UPPER GASTROINTESTINAL ENDOSCOPY  03/01/2019    Current Medications: Current Meds  Medication Sig   albuterol (VENTOLIN HFA) 108 (90 Base) MCG/ACT inhaler Inhale 1-2 puffs into the lungs every 6 (six) hours as needed for wheezing or shortness of breath.   amLODipine-olmesartan (AZOR) 10-40 MG tablet TAKE 1 TABLET BY MOUTH EVERY DAY (Patient taking differently: Take 1 tablet by mouth daily.)   budesonide-formoterol (SYMBICORT) 80-4.5 MCG/ACT inhaler Inhale 2 puffs into the lungs See admin instructions. Twice monthly   DEXILANT 60 MG capsule TAKE 1 CAPSULE (60 MG TOTAL) BY MOUTH DAILY BEFORE BREAKFAST. (Patient taking differently: Take 60 mg by mouth daily.)   EPINEPHrine 0.3 mg/0.3 mL IJ SOAJ injection Inject 1 Dose into the muscle as needed for allergies. Inject after allergy shot for anaphylaxis   famotidine (PEPCID) 40 MG tablet TAKE 1 TABLET BY MOUTH EVERY  DAY (Patient taking differently: Take 40 mg by mouth daily.)   fluticasone (FLONASE) 50 MCG/ACT nasal spray Place 1 spray into both nostrils daily as needed for allergies or rhinitis.   fluticasone (FLOVENT HFA) 220 MCG/ACT inhaler Inhale 1 puff into the lungs in the morning and at bedtime. (Patient taking differently: Inhale 1 puff into the lungs as needed (allergies/wheezing).)   levocetirizine (XYZAL) 5 MG tablet Take 5 mg by mouth every evening.   montelukast (SINGULAIR) 10 MG tablet TAKE 1 TABLET BY MOUTH EVERYDAY AT BEDTIME (Patient taking differently: Take 10 mg by mouth at bedtime.)   oxybutynin (DITROPAN-XL) 10 MG 24 hr tablet Take 10 mg by mouth daily.     promethazine-dextromethorphan (PROMETHAZINE-DM) 6.25-15 MG/5ML syrup Take 2.5-5 mLs by mouth 3 (three) times daily as needed for cough.     Allergies:   Sulfamethoxazole-trimethoprim, Sulfonamide derivatives, and Sulfa antibiotics   Social History   Socioeconomic History   Marital status: Divorced    Spouse name: Not on file   Number of children: 1   Years of education: Not on file   Highest education level: Not on file  Occupational History   Occupation: HS music teacher- [employer]  Tobacco Use   Smoking status: Never   Smokeless tobacco: Never  Vaping Use   Vaping Use: Never used  Substance and Sexual Activity   Alcohol use: Yes    Comment: social   Drug use: No   Sexual activity: Not on file    Comment: band teacher, 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 no dietary restrictions  Other Topics Concern   Not on file  Social History Narrative   Single/divorced   1 son born 43 - sometimes there w/ her, leukemia 2016   Music teacher [employer]   Caffeine use 1 cup AM   Never smoker/tobacco   No drugs   Rare-occ EtOHSmoking    Social Determinants of Health   Financial Resource Strain: Not on file  Food Insecurity: Not on file  Transportation Needs: Not on file  Physical Activity: Not on file  Stress: Not on file  Social Connections: Not on file     Family History: The patient's family history includes Cancer in her father and paternal grandfather; Esophageal cancer in her father; Heart disease in her father; Hypertension in her father and mother; Liver disease in her brother and mother; Mental illness in her brother; Stroke in her maternal grandmother and mother. There is no history of Colitis, Colon cancer, Rectal cancer, or Stomach cancer. ROS:   Please see the history of present illness.    All 14 point review of systems negative except as described per history of present illness.  EKGs/Labs/Other Studies Reviewed:    The following studies were reviewed  today: Echocardiogram reviewed which showed ejection fraction 50 to 55%   1. Left ventricular ejection fraction, by estimation, is 50 to 55%. The  left ventricle has low normal function. The left ventricle has no regional  wall motion abnormalities. Left ventricular diastolic parameters are  consistent with Grade I diastolic  dysfunction (impaired relaxation).   2. Right ventricular systolic function is normal. The right ventricular  size is normal. Tricuspid regurgitation signal is inadequate for assessing  PA pressure.   3. The mitral valve is normal in structure. No evidence of mitral valve  regurgitation. No evidence of mitral stenosis.   4. The aortic valve is tricuspid. Aortic valve regurgitation is not  visualized. No aortic stenosis is present.  5. The inferior vena cava is normal in size with greater than 50%  respiratory variability, suggesting right atrial pressure of 3 mmHg.   EKG:  EKG is  ordered today.  The ekg ordered today demonstrates normal sinus rhythm, low voltage QRS, nonspecific ST segment changes  Recent Labs: 11/28/2020: Hemoglobin 11.1; Magnesium 2.1; Platelets 408.0; TSH 2.58 12/15/2020: ALT 9; BUN 13; Creatinine, Ser 1.11; Potassium 4.3; Sodium 144  Recent Lipid Panel    Component Value Date/Time   CHOL 145 11/28/2020 1043   CHOL 150 08/11/2017 1155   TRIG 83.0 11/28/2020 1043   HDL 41.30 11/28/2020 1043   HDL 52 08/11/2017 1155   CHOLHDL 4 11/28/2020 1043   VLDL 16.6 11/28/2020 1043   LDLCALC 87 11/28/2020 1043   LDLCALC 84 08/11/2017 1155    Physical Exam:    VS:  BP 120/71 (BP Location: Right Arm, Patient Position: Sitting)   Pulse 72   Ht 5' 4.5" (1.638 m)   Wt 273 lb 0.6 oz (123.9 kg)   SpO2 97%   BMI 46.14 kg/m     Wt Readings from Last 3 Encounters:  03/07/21 273 lb 0.6 oz (123.9 kg)  03/05/21 273 lb 9.6 oz (124.1 kg)  11/28/20 271 lb (122.9 kg)     GEN:  Well nourished, well developed in no acute distress HEENT: Normal NECK: No  JVD; No carotid bruits LYMPHATICS: No lymphadenopathy CARDIAC: RRR, no murmurs, no rubs, no gallops RESPIRATORY:  Clear to auscultation without rales, wheezing or rhonchi  ABDOMEN: Soft, non-tender, non-distended MUSCULOSKELETAL:  No edema; No deformity  SKIN: Warm and dry NEUROLOGIC:  Alert and oriented x 3 PSYCHIATRIC:  Normal affect   ASSESSMENT:    1. Essential hypertension   2. OSA (obstructive sleep apnea)   3. Insulin resistance   4. Obesity, unspecified classification, unspecified obesity type, unspecified whether serious comorbidity present   5. Dyspnea on exertion    PLAN:    In order of problems listed above:  Essential hypertension blood pressure seems to be well controlled continue present management. Dyspnea on exertion which is new progressive problem.  Concern is also deterioration left ventricle ejection fraction still within normal limits however lower limits of normal priority of this it was perfectly normal.  Of course concern is about potentially developing cardiomyopathy.  I will give her Lasix 20 mg of Lasix today and daily, will check a proBNP today as well as Chem-7 a week later we will recheck Chem-7.  In terms of etiology of this phenomenon of course concern is about potentially coronary artery disease I will ask her to have coronary CT angio to rule out coronary artery disease as a reason for her deterioration of left ventricle ejection fraction or shortness of breath additional benefits of coronary CT angios the fact that we will be able to look at lung parenchyma.  I will not initiate any antianginal therapy since she does not have any typical symptoms I suspect however possibility of anginal equivalent. Obesity which is a problem which is undoubtedly contributing factor.  In the benefactor when we do cardiac work-up which would be negative she may require to see pulmonologist to do pulmonary function test I reviewed all those tests are nonrevealing I think what  we need to concentrate on is conditioning and weight loss. Cholesterol status her cholesterol is actually quite good with LDL of 87 HDL 47 this is from June of this year.   Medication Adjustments/Labs and Tests Ordered: Current medicines are reviewed  at length with the patient today.  Concerns regarding medicines are outlined above.  No orders of the defined types were placed in this encounter.  No orders of the defined types were placed in this encounter.   Signed, Georgeanna Lea, MD, [facility]. 03/07/2021 10:16 AM    [facility]

## 2021-03-07 NOTE — Patient Instructions (Signed)
Medication Instructions:  Your physician has recommended you make the following change in your medication:  START: Lasix 20 mg daily   *If you need a refill on your cardiac medications before your next appointment, please call your pharmacy*   Lab Work: Your physician recommends that you return for lab work today: bmp, pro bnp   LABS 1 week: BMP   LABS 3-7 days before ct: BMP  If you have labs (blood work) drawn today and your tests are completely normal, you will receive your results only by: MyChart Message (if you have MyChart) OR A paper copy in the mail If you have any lab test that is abnormal or we need to change your treatment, we will call you to review the results.   Testing/Procedures:   Your cardiac CT will be scheduled at one of the below locations:   [facility] [address] [place], [place] [address] [phone]  OR  [facility] [address] [place], [place] [address] [phone]  If scheduled at [facility], please arrive at the [facility] main entrance (entrance A) of [facility] 30 minutes prior to test start time. Proceed to the [facility] Radiology Department (first floor) to check-in and test prep.  If scheduled at [facility], please arrive 15 mins early for check-in and test prep.  Please follow these instructions carefully (unless otherwise directed):    On the Night Before the Test: Be sure to Drink plenty of water. Do not consume any caffeinated/decaffeinated beverages or chocolate 12 hours prior to your test. Do not take any antihistamines 12 hours prior to your test.   On the Day of the Test: Drink plenty of water until 1 hour prior to the test. Do not eat any food 4 hours prior to the test. You may take your regular medications prior to the test.  Take metoprolol (Lopressor) two hours prior to test. HOLD  Furosemide/Hydrochlorothiazide morning of the test. FEMALES- please wear underwire-free bra if available, avoid dresses & tight clothing          After the Test: Drink plenty of water. After receiving IV contrast, you may experience a mild flushed feeling. This is normal. On occasion, you may experience a mild rash up to 24 hours after the test. This is not dangerous. If this occurs, you can take Benadryl 25 mg and increase your fluid intake. If you experience trouble breathing, this can be serious. If it is severe call 911 IMMEDIATELY. If it is mild, please call our office. If you take any of these medications: Glipizide/Metformin, Avandament, Glucavance, please do not take 48 hours after completing test unless otherwise instructed.  Please allow 2-4 weeks for scheduling of routine cardiac CTs. Some insurance companies require a pre-authorization which may delay scheduling of this test.   For non-scheduling related questions, please contact the cardiac imaging nurse navigator should you have any questions/concerns: Rockwell Alexandria, Cardiac Imaging Nurse Navigator Larey Brick, Cardiac Imaging Nurse Navigator [facility] Direct Office Dial: [phone]   For scheduling needs, including cancellations and rescheduling, please call [place], [phone].    Follow-Up: At [facility], you and your health needs are our priority.  As part of our continuing mission to provide you with exceptional heart care, we have created designated Provider Care Teams.  These Care Teams include your primary Cardiologist (physician) and Advanced Practice Providers (APPs -  Physician Assistants and Nurse Practitioners)  who all work together to provide you with the care you need, when you need it.  We recommend signing up for the patient portal called "MyChart".  Sign up information is provided on this After Visit Summary.  MyChart is used to connect with patients for Virtual  Visits (Telemedicine).  Patients are able to view lab/test results, encounter notes, upcoming appointments, etc.  Non-urgent messages can be sent to your provider as well.   To learn more about what you can do with MyChart, go to [email or web address].    Your next appointment:   2 month(s)  The format for your next appointment:   In Person  Provider:   Gypsy Balsam, MD   Other Instructions  Cardiac CT Angiogram A cardiac CT angiogram is a procedure to look at the heart and the area around the heart. It may be done to help find the cause of chest pains or other symptoms of heart disease. During this procedure, a substance called contrast dye is injected into the blood vessels in the area to be checked. A large X-ray machine, called a CT scanner, then takes detailed pictures of the heart and the surrounding area. The procedure is also sometimes called a coronary CT angiogram, coronary artery scanning, or CTA. A cardiac CT angiogram allows the health care provider to see how well blood is flowing to and from the heart. The health care provider will be able to see if there are any problems, such as: Blockage or narrowing of the coronary arteries in the heart. Fluid around the heart. Signs of weakness or disease in the muscles, valves, and tissues of the heart. Tell a health care provider about: Any allergies you have. This is especially important if you have had a previous allergic reaction to contrast dye. All medicines you are taking, including vitamins, herbs, eye drops, creams, and over-the-counter medicines. Any blood disorders you have. Any surgeries you have had. Any medical conditions you have. Whether you are pregnant or may be pregnant. Any anxiety disorders, chronic pain, or other conditions you have that may increase your stress or prevent you from lying still. What are the risks? Generally, this is a safe procedure. However, problems may occur,  including: Bleeding. Infection. Allergic reactions to medicines or dyes. Damage to other structures or organs. Kidney damage from the contrast dye that is used. Increased risk of cancer from radiation exposure. This risk is low. Talk with your health care provider about: The risks and benefits of testing. How you can receive the lowest dose of radiation. What happens before the procedure? Wear comfortable clothing and remove any jewelry, glasses, dentures, and hearing aids. Follow instructions from your health care provider about eating and drinking. This may include: For 12 hours before the procedure -- avoid caffeine. This includes tea, coffee, soda, energy drinks, and diet pills. Drink plenty of water or other fluids that do not have caffeine in them. Being well hydrated can prevent complications. For 4-6 hours before the procedure -- stop eating and drinking. The contrast dye can cause nausea, but this is less likely if your stomach is empty. Ask your health care provider about changing or stopping your regular medicines. This is especially important if you are taking diabetes medicines, blood thinners, or medicines to treat problems with erections (erectile dysfunction). What happens during the procedure?  Hair on your chest may need to be removed so that small sticky patches called electrodes can be placed on your chest. These will transmit information that helps  to monitor your heart during the procedure. An IV will be inserted into one of your veins. You might be given a medicine to control your heart rate during the procedure. This will help to ensure that good images are obtained. You will be asked to lie on an exam table. This table will slide in and out of the CT machine during the procedure. Contrast dye will be injected into the IV. You might feel warm, or you may get a metallic taste in your mouth. You will be given a medicine called nitroglycerin. This will relax or dilate the  arteries in your heart. The table that you are lying on will move into the CT machine tunnel for the scan. The person running the machine will give you instructions while the scans are being done. You may be asked to: Keep your arms above your head. Hold your breath. Stay very still, even if the table is moving. When the scanning is complete, you will be moved out of the machine. The IV will be removed. The procedure may vary among health care providers and hospitals. What can I expect after the procedure? After your procedure, it is common to have: A metallic taste in your mouth from the contrast dye. A feeling of warmth. A headache from the nitroglycerin. Follow these instructions at home: Take over-the-counter and prescription medicines only as told by your health care provider. If you are told, drink enough fluid to keep your urine pale yellow. This will help to flush the contrast dye out of your body. Most people can return to their normal activities right after the procedure. Ask your health care provider what activities are safe for you. It is up to you to get the results of your procedure. Ask your health care provider, or the department that is doing the procedure, when your results will be ready. Keep all follow-up visits as told by your health care provider. This is important. Contact a health care provider if: You have any symptoms of allergy to the contrast dye. These include: Shortness of breath. Rash or hives. A racing heartbeat. Summary A cardiac CT angiogram is a procedure to look at the heart and the area around the heart. It may be done to help find the cause of chest pains or other symptoms of heart disease. During this procedure, a large X-ray machine, called a CT scanner, takes detailed pictures of the heart and the surrounding area after a contrast dye has been injected into blood vessels in the area. Ask your health care provider about changing or stopping your  regular medicines before the procedure. This is especially important if you are taking diabetes medicines, blood thinners, or medicines to treat erectile dysfunction. If you are told, drink enough fluid to keep your urine pale yellow. This will help to flush the contrast dye out of your body. This information is not intended to replace advice given to you by your health care provider. Make sure you discuss any questions you have with your health care provider. Document Revised: 01/13/2019 Document Reviewed: 01/13/2019 Elsevier Patient Education  2022 [employer].

## 2021-03-07 NOTE — Assessment & Plan Note (Deleted)
NPSG 06/22/2017-[facility] ([facility]) AHI 15.9/ hr, desaturation to 71%, body weight 273 lbs, CPAP to 7.

## 2021-03-07 NOTE — Assessment & Plan Note (Signed)
Previously tried CPAP. Interested in oral appliance.  Plan- refer to consider oral appliance for OSA. Work on [employer].

## 2021-03-08 LAB — BASIC METABOLIC PANEL
BUN/Creatinine Ratio: 13 (ref 12–28)
BUN: 13 mg/dL (ref 8–27)
CO2: 24 mmol/L (ref 20–29)
Calcium: 9.7 mg/dL (ref 8.7–10.3)
Chloride: 103 mmol/L (ref 96–106)
Creatinine, Ser: 1.04 mg/dL — ABNORMAL HIGH (ref 0.57–1.00)
Glucose: 110 mg/dL — ABNORMAL HIGH (ref 70–99)
Potassium: 4 mmol/L (ref 3.5–5.2)
Sodium: 142 mmol/L (ref 134–144)
eGFR: 59 mL/min/{1.73_m2} — ABNORMAL LOW (ref >59)

## 2021-03-08 LAB — PRO B NATRIURETIC PEPTIDE: NT-Pro BNP: 21 pg/mL (ref 0–301)

## 2021-03-09 ENCOUNTER — Telehealth: Payer: Self-pay

## 2021-03-09 NOTE — Telephone Encounter (Signed)
-----   Message from Georgeanna Lea, MD sent at 03/09/2021  8:52 AM EDT ----- No evidence of congestive heart failure based of lab work test

## 2021-03-09 NOTE — Telephone Encounter (Signed)
Spoke with patient regarding results and recommendation.  Patient verbalizes understanding and is agreeable to plan of care. Advised patient to call back with any issues or concerns.  

## 2021-03-12 ENCOUNTER — Telehealth (HOSPITAL_COMMUNITY): Payer: Self-pay | Admitting: Emergency Medicine

## 2021-03-12 NOTE — Telephone Encounter (Signed)
Reaching out to patient to offer assistance regarding upcoming cardiac imaging study; pt verbalizes understanding of appt date/time, parking situation and where to check in, pre-test NPO status and medications ordered, and verified current allergies; name and call back number provided for further questions should they arise Rockwell Alexandria RN Navigator Cardiac Imaging [facility] [phone] office [phone] cell  Pt questioning authorization for CCTA. Sent message to [facility] to follow up. 100mg  metop 2 hr prior to scan L arm is better for IV access

## 2021-03-14 ENCOUNTER — Other Ambulatory Visit: Payer: Self-pay

## 2021-03-14 ENCOUNTER — Ambulatory Visit (HOSPITAL_COMMUNITY)
Admission: RE | Admit: 2021-03-14 | Discharge: 2021-03-14 | Disposition: A | Discharge status: Home / Self Care | Payer: Medicare PPO | Source: Ambulatory Visit | Attending: Cardiology | Admitting: Cardiology

## 2021-03-14 DIAGNOSIS — I2510 Atherosclerotic heart disease of native coronary artery without angina pectoris: Secondary | ICD-10-CM | POA: Insufficient documentation

## 2021-03-14 DIAGNOSIS — K769 Liver disease, unspecified: Secondary | ICD-10-CM | POA: Diagnosis not present

## 2021-03-14 DIAGNOSIS — E8881 Metabolic syndrome: Secondary | ICD-10-CM | POA: Insufficient documentation

## 2021-03-14 DIAGNOSIS — R0609 Other forms of dyspnea: Secondary | ICD-10-CM | POA: Diagnosis not present

## 2021-03-14 DIAGNOSIS — I10 Essential (primary) hypertension: Secondary | ICD-10-CM | POA: Diagnosis not present

## 2021-03-14 DIAGNOSIS — E669 Obesity, unspecified: Secondary | ICD-10-CM

## 2021-03-14 DIAGNOSIS — G4733 Obstructive sleep apnea (adult) (pediatric): Secondary | ICD-10-CM

## 2021-03-14 DIAGNOSIS — R079 Chest pain, unspecified: Secondary | ICD-10-CM | POA: Diagnosis not present

## 2021-03-14 DIAGNOSIS — E88819 Insulin resistance, unspecified: Secondary | ICD-10-CM

## 2021-03-14 DIAGNOSIS — R072 Precordial pain: Secondary | ICD-10-CM | POA: Diagnosis not present

## 2021-03-14 MED ORDER — IOHEXOL 350 MG/ML SOLN
75.0000 mL | Freq: Once | INTRAVENOUS | Status: AC | PRN
  Administered 2021-03-14: 75 mL via INTRAVENOUS

## 2021-03-14 MED ORDER — NITROGLYCERIN 0.4 MG SL SUBL
SUBLINGUAL_TABLET | SUBLINGUAL | Status: AC
  Filled 2021-03-14: qty 2

## 2021-03-14 MED ORDER — NITROGLYCERIN 0.4 MG SL SUBL
0.8000 mg | SUBLINGUAL_TABLET | Freq: Once | SUBLINGUAL | Status: AC
  Administered 2021-03-14: 0.8 mg via SUBLINGUAL

## 2021-04-09 ENCOUNTER — Ambulatory Visit: Payer: Medicare PPO | Admitting: Family Medicine

## 2021-04-09 ENCOUNTER — Other Ambulatory Visit: Payer: Self-pay | Admitting: Family Medicine

## 2021-04-09 ENCOUNTER — Other Ambulatory Visit: Payer: Self-pay

## 2021-04-09 VITALS — BP 110/62 | HR 77 | Temp 98.4°F | Resp 16 | Wt 274.6 lb

## 2021-04-09 DIAGNOSIS — R42 Dizziness and giddiness: Secondary | ICD-10-CM

## 2021-04-09 DIAGNOSIS — U071 COVID-19: Secondary | ICD-10-CM

## 2021-04-09 DIAGNOSIS — E782 Mixed hyperlipidemia: Secondary | ICD-10-CM

## 2021-04-09 DIAGNOSIS — I10 Essential (primary) hypertension: Secondary | ICD-10-CM

## 2021-04-09 DIAGNOSIS — M545 Low back pain, unspecified: Secondary | ICD-10-CM

## 2021-04-09 DIAGNOSIS — K769 Liver disease, unspecified: Secondary | ICD-10-CM

## 2021-04-09 DIAGNOSIS — R0609 Other forms of dyspnea: Secondary | ICD-10-CM

## 2021-04-09 DIAGNOSIS — R739 Hyperglycemia, unspecified: Secondary | ICD-10-CM | POA: Diagnosis not present

## 2021-04-09 DIAGNOSIS — E669 Obesity, unspecified: Secondary | ICD-10-CM | POA: Diagnosis not present

## 2021-04-09 DIAGNOSIS — E559 Vitamin D deficiency, unspecified: Secondary | ICD-10-CM

## 2021-04-09 DIAGNOSIS — M1990 Unspecified osteoarthritis, unspecified site: Secondary | ICD-10-CM

## 2021-04-09 LAB — LIPID PANEL
Cholesterol: 138 mg/dL (ref 0–200)
HDL: 39.9 mg/dL (ref >39.00)
LDL Cholesterol: 72 mg/dL (ref 0–99)
NonHDL: 98.35
Total CHOL/HDL Ratio: 3
Triglycerides: 134 mg/dL (ref 0.0–149.0)
VLDL: 26.8 mg/dL (ref 0.0–40.0)

## 2021-04-09 LAB — CBC
HCT: 35.6 % — ABNORMAL LOW (ref 36.0–46.0)
Hemoglobin: 11.5 g/dL — ABNORMAL LOW (ref 12.0–15.0)
MCHC: 32.3 g/dL (ref 30.0–36.0)
MCV: 91.1 fl (ref 78.0–100.0)
Platelets: 363 10*3/uL (ref 150.0–400.0)
RBC: 3.91 Mil/uL (ref 3.87–5.11)
RDW: 16.4 % — ABNORMAL HIGH (ref 11.5–15.5)
WBC: 9.5 10*3/uL (ref 4.0–10.5)

## 2021-04-09 LAB — COMPREHENSIVE METABOLIC PANEL
ALT: 13 U/L (ref 0–35)
AST: 14 U/L (ref 0–37)
Albumin: 3.9 g/dL (ref 3.5–5.2)
Alkaline Phosphatase: 70 U/L (ref 39–117)
BUN: 17 mg/dL (ref 6–23)
CO2: 30 mEq/L (ref 19–32)
Calcium: 9.4 mg/dL (ref 8.4–10.5)
Chloride: 105 mEq/L (ref 96–112)
Creatinine, Ser: 1.06 mg/dL (ref 0.40–1.20)
GFR: 53.99 mL/min — ABNORMAL LOW (ref >60.00)
Glucose, Bld: 100 mg/dL — ABNORMAL HIGH (ref 70–99)
Potassium: 4.2 mEq/L (ref 3.5–5.1)
Sodium: 142 mEq/L (ref 135–145)
Total Bilirubin: 0.6 mg/dL (ref 0.2–1.2)
Total Protein: 6.9 g/dL (ref 6.0–8.3)

## 2021-04-09 LAB — VITAMIN D 25 HYDROXY (VIT D DEFICIENCY, FRACTURES): VITD: 20.38 ng/mL — ABNORMAL LOW (ref 30.00–100.00)

## 2021-04-09 LAB — HEMOGLOBIN A1C: Hgb A1c MFr Bld: 5.6 % (ref 4.6–6.5)

## 2021-04-09 LAB — TSH: TSH: 3.67 u[IU]/mL (ref 0.35–5.50)

## 2021-04-09 NOTE — Progress Notes (Signed)
Subjective:   By signing my name below, I, Terri [name], attest that this documentation has been prepared under the direction and in the presence of [name], MD. 04/09/2021   [name], female    DOB: 1950-06-09, 70 y.o.   MRN: [id number]  No chief complaint on file.   HPI Patient is in today for an office visit and 4 month f/u.  She reports she is doing well. No recent illness or hospitalizations.  She had Covid-19 on 01/06/2021.  She had a CT coronary scan on 03/14/2021 which showed several arteries with various stages of occlusion and lesions on the liver. She denies chest pain, SOB and palpitations. She normally has trouble breathing but nothing out of the ordinary.   She mentions she has not been drinking enough fluids because the Lasix has increased her urinary frequency. She thinks the LLE edema is still the same and has not decreased.  She reports she is experiencing baldness. She reports this had happened before but it was due to stress. Not stressed at this moment but is still worried.   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 She reports she has been feeling off-balance especially when getting up, turning too fast or getting out of the car.She denies falls but endorses constant tinnitus in left ear. Associated symptoms with the off balance include "feeling full" in her left ear. Denies fevers, chills and vision changes.  She has 2 Covid-19 vaccines at this time. She is not interested in the flu and pneumonia vaccine at this time.  Past Medical History:  Diagnosis Date   Allergic rhinitis    Apnea, sleep 06/28/2012   Previously used CPAP   Asthma    Atypical chest pain 01/14/2017   Back pain    Decreased visual acuity 08/05/2016   Depression 06/19/2015   Environmental and seasonal allergies    GERD (gastroesophageal reflux disease)    Heartburn    Hip pain, right 06/28/2012   HTN (hypertension)    Hypokalemia 01/14/2017   Leg pain     Morbid obesity ([place]) 04/11/2008   Qualifier: Diagnosis of  By: Shawna Orleans DO, Sandy Salaam    Numbness in feet 05/06/2016   Obesity    Osteoarthritis 01/16/2010   Qualifier: Diagnosis of  By: Wynona Luna    Preventative health care 06/25/2015   Shortness of breath    Shortness of breath on exertion    Sleep apnea    Swelling of extremity    Tinnitus    Vertigo     Past Surgical History:  Procedure Laterality Date   BIOPSY BREAST  1997   left    childbirth     COLONOSCOPY  2004   UPPER GASTROINTESTINAL ENDOSCOPY  03/01/2019    Family History  Problem Relation Age of Onset   Stroke Mother    Liver disease Mother        hepatitis b   Hypertension Mother    Cancer Father        esophagus, prostate   Hypertension Father    Heart disease Father    Esophageal cancer Father    Liver disease Brother        hep c   Mental illness Brother        PTSD from [place]   Stroke Maternal Grandmother    Cancer Paternal Grandfather        prostate   Colitis Neg Hx  Colon cancer Neg Hx    Rectal cancer Neg Hx    Stomach cancer Neg Hx     Social History   Socioeconomic History   Marital status: Divorced    Spouse name: Not on file   Number of children: 1   Years of education: Not on file   Highest education level: Not on file  Occupational History   Occupation: HS music teacher- [employer]  Tobacco Use   Smoking status: Never   Smokeless tobacco: Never  Vaping Use   Vaping Use: Never used  Substance and Sexual Activity   Alcohol use: Yes    Comment: social   Drug use: No   Sexual activity: Not on file    Comment: band teacher, 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 no dietary restrictions  Other Topics Concern   Not on file  Social History Narrative   Single/divorced   1 son born 74 - sometimes there w/ her, leukemia 2016   Music teacher [employer]   Caffeine use 1 cup AM   Never smoker/tobacco   No drugs   Rare-occ EtOHSmoking    Social Determinants of Health    Financial Resource Strain: Not on file  Food Insecurity: Not on file  Transportation Needs: Not on file  Physical Activity: Not on file  Stress: Not on file  Social Connections: Not on file  Intimate Partner Violence: Not on file    Outpatient Medications Prior to Visit  Medication Sig Dispense Refill   albuterol (VENTOLIN HFA) 108 (90 Base) MCG/ACT inhaler Inhale 1-2 puffs into the lungs every 6 (six) hours as needed for wheezing or shortness of breath. 18 g 5   amLODipine-olmesartan (AZOR) 10-40 MG tablet TAKE 1 TABLET BY MOUTH EVERY DAY (Patient taking differently: Take 1 tablet by mouth daily.) 90 tablet 1   budesonide-formoterol (SYMBICORT) 80-4.5 MCG/ACT inhaler Inhale 2 puffs into the lungs See admin instructions. Twice monthly     DEXILANT 60 MG capsule TAKE 1 CAPSULE (60 MG TOTAL) BY MOUTH DAILY BEFORE BREAKFAST. (Patient taking differently: Take 60 mg by mouth daily.) 90 capsule 0   EPINEPHrine 0.3 mg/0.3 mL IJ SOAJ injection Inject 1 Dose into the muscle as needed for allergies. Inject after allergy shot for anaphylaxis     famotidine (PEPCID) 40 MG tablet TAKE 1 TABLET BY MOUTH EVERY DAY (Patient taking differently: Take 40 mg by mouth daily.) 90 tablet 3   fluticasone (FLONASE) 50 MCG/ACT nasal spray Place 1 spray into both nostrils daily as needed for allergies or rhinitis.     fluticasone (FLOVENT HFA) 220 MCG/ACT inhaler Inhale 1 puff into the lungs in the morning and at bedtime. (Patient taking differently: Inhale 1 puff into the lungs as needed (allergies/wheezing).) 1 Inhaler 12   furosemide (LASIX) 20 MG tablet Take 1 tablet (20 mg total) by mouth daily. 90 tablet 1   levocetirizine (XYZAL) 5 MG tablet Take 5 mg by mouth every evening.     montelukast (SINGULAIR) 10 MG tablet TAKE 1 TABLET BY MOUTH EVERYDAY AT BEDTIME (Patient taking differently: Take 10 mg by mouth at bedtime.) 90 tablet 1   metoprolol tartrate (LOPRESSOR) 100 MG tablet Take 1 tablet (100 mg total) by  mouth once for 1 dose. 2 hours before ct 1 tablet 0   oxybutynin (DITROPAN-XL) 10 MG 24 hr tablet Take 10 mg by mouth daily.      promethazine-dextromethorphan (PROMETHAZINE-DM) 6.25-15 MG/5ML syrup Take 2.5-5 mLs by mouth 3 (three) times daily as needed  for cough. 240 mL 0   No facility-administered medications prior to visit.    Allergies  Allergen Reactions   Sulfamethoxazole-Trimethoprim     Other reaction(s): Unknown   Sulfonamide Derivatives    Sulfa Antibiotics Rash    Review of Systems  Constitutional:  Negative for fever and malaise/fatigue.  HENT:  Positive for hearing loss (left ear) and tinnitus (left ear). Negative for congestion.   Eyes:  Negative for redness.  Respiratory:  Negative for shortness of breath.   Cardiovascular:  Positive for leg swelling. Negative for chest pain and palpitations.  Gastrointestinal:  Negative for abdominal pain, blood in stool and nausea.  Genitourinary:  Negative for dysuria and frequency.  Musculoskeletal:  Negative for falls.  Skin:  Negative for rash.  Neurological:  Negative for dizziness, loss of consciousness and headaches.       (+) loss of balance  (+) vertigo  Endo/Heme/Allergies:  Negative for polydipsia.  Psychiatric/Behavioral:  Negative for depression. The patient is not nervous/anxious.       Objective:    Physical Exam Constitutional:      General: She is not in acute distress.    Appearance: She is well-developed.  HENT:     Head: Normocephalic and atraumatic.  Eyes:     Extraocular Movements:     Right eye: Nystagmus present.     Left eye: Nystagmus present.     Conjunctiva/sclera: Conjunctivae normal.     Comments: Nystagmus bilaterally to the left 2 beats  Neck:     Thyroid: No thyromegaly.  Cardiovascular:     Rate and Rhythm: Normal rate and regular rhythm.     Heart sounds: Normal heart sounds. No murmur heard. Pulmonary:     Effort: Pulmonary effort is normal. No respiratory distress.     Breath  sounds: Normal breath sounds.  Abdominal:     General: Bowel sounds are normal. There is no distension.     Palpations: Abdomen is soft. There is no mass.     Tenderness: There is no abdominal tenderness.  Musculoskeletal:     Cervical back: Neck supple.     Left lower leg: Edema present.  Lymphadenopathy:     Cervical: No cervical adenopathy.  Skin:    General: Skin is warm and dry.  Neurological:     Mental Status: She is alert and oriented to person, place, and time.  Psychiatric:        Behavior: Behavior normal.    BP 110/62   Pulse 77   Temp 98.4 F (36.9 C)   Resp 16   Wt 274 lb 9.6 oz (124.6 kg)   SpO2 97%   BMI 46.41 kg/m  Wt Readings from Last 3 Encounters:  04/09/21 274 lb 9.6 oz (124.6 kg)  03/07/21 273 lb 0.6 oz (123.9 kg)  03/05/21 273 lb 9.6 oz (124.1 kg)    Diabetic Foot Exam - Simple   No data filed    Lab Results  Component Value Date   WBC 9.5 04/09/2021   HGB 11.5 (L) 04/09/2021   HCT 35.6 (L) 04/09/2021   PLT 363.0 04/09/2021   GLUCOSE 100 (H) 04/09/2021   CHOL 138 04/09/2021   TRIG 134.0 04/09/2021   HDL 39.90 04/09/2021   LDLCALC 72 04/09/2021   ALT 13 04/09/2021   AST 14 04/09/2021   NA 142 04/09/2021   K 4.2 04/09/2021   CL 105 04/09/2021   CREATININE 1.06 04/09/2021   BUN 17 04/09/2021   CO2 30  04/09/2021   TSH 3.67 04/09/2021   HGBA1C 5.6 04/09/2021    Lab Results  Component Value Date   TSH 3.67 04/09/2021   Lab Results  Component Value Date   WBC 9.5 04/09/2021   HGB 11.5 (L) 04/09/2021   HCT 35.6 (L) 04/09/2021   MCV 91.1 04/09/2021   PLT 363.0 04/09/2021   Lab Results  Component Value Date   NA 142 04/09/2021   K 4.2 04/09/2021   CO2 30 04/09/2021   GLUCOSE 100 (H) 04/09/2021   BUN 17 04/09/2021   CREATININE 1.06 04/09/2021   BILITOT 0.6 04/09/2021   ALKPHOS 70 04/09/2021   AST 14 04/09/2021   ALT 13 04/09/2021   PROT 6.9 04/09/2021   ALBUMIN 3.9 04/09/2021   CALCIUM 9.4 04/09/2021   ANIONGAP 7  03/19/2019   EGFR 59 (L) 03/07/2021   GFR 53.99 (L) 04/09/2021   Lab Results  Component Value Date   CHOL 138 04/09/2021   Lab Results  Component Value Date   HDL 39.90 04/09/2021   Lab Results  Component Value Date   LDLCALC 72 04/09/2021   Lab Results  Component Value Date   TRIG 134.0 04/09/2021   Lab Results  Component Value Date   CHOLHDL 3 04/09/2021   Lab Results  Component Value Date   HGBA1C 5.6 04/09/2021       Assessment & Plan:   Problem List Items Addressed This Visit     Hyperlipidemia, mixed    Encourage heart healthy diet such as MIND or DASH diet, increase exercise, avoid trans fats, simple carbohydrates and processed foods, consider a krill or fish or flaxseed oil cap daily.       Relevant Orders   Lipid panel (Completed)   Obesity - Primary    Encouraged DASH or MIND diet, decrease po intake and increase exercise as tolerated. Needs 7-8 hours of sleep nightly. Avoid trans fats, eat small, frequent meals every 4-5 hours with lean proteins, complex carbs and healthy fats. Minimize simple carbs, high fat foods and processed foods. Consider [phone] or Saxenda      Essential hypertension    Well controlled, no changes to meds. Encouraged heart healthy diet such as the DASH diet and exercise as tolerated.       Relevant Orders   CBC (Completed)   Comprehensive metabolic panel (Completed)   TSH (Completed)   Ambulatory referral to Physical Therapy   Osteoarthritis   Relevant Orders   Ambulatory referral to Physical Therapy   Vertigo   Relevant Orders   Ambulatory referral to Physical Therapy   Vitamin D deficiency    Supplement and monitor      Relevant Orders   VITAMIN D 25 Hydroxy (Vit-D Deficiency, Fractures) (Completed)   Hyperglycemia    hgba1c acceptable, minimize simple carbs. Increase exercise as tolerated.       Relevant Orders   Hemoglobin A1c (Completed)   Back pain    With neck pain and unsteady gait. Referred to physical  therapy for evaluation and treatment. She does note some disequilibrium and tinnitus in left ear.       COVID-19    She had COVID a couple months ago. She is encouraged to take the bivalent booster vaccine before she travels for the holidays      Dyspnea on exertion    She has been evaluated by cardiology and had imaging performed they do not believe this is cardiac related and she will report if symptoms worsen or  new symptoms develop      Liver lesion    Found on CT calcium scan of the heart, radiology suggests dedicated MRI of the abdomen with Gadolinium to evaluate.        No orders of the defined types were placed in this encounter.   I,Terri [name],acting as a Education administrator for Penni Homans, MD.,have documented all relevant documentation on the behalf of Penni Homans, MD,as directed by  Penni Homans, MD while in the presence of Penni Homans, MD.   I, [name], MD. , personally preformed the services described in this documentation.  All medical record entries made by the scribe were at my direction and in my presence.  I have reviewed the chart and discharge instructions (if applicable) and agree that the record reflects my personal performance and is accurate and complete. 04/09/2021

## 2021-04-09 NOTE — Assessment & Plan Note (Addendum)
She has been evaluated by cardiology and had imaging performed they do not believe this is cardiac related and she will report if symptoms worsen or new symptoms develop

## 2021-04-09 NOTE — Assessment & Plan Note (Signed)
Encourage heart healthy diet such as MIND or DASH diet, increase exercise, avoid trans fats, simple carbohydrates and processed foods, consider a krill or fish or flaxseed oil cap daily.  °

## 2021-04-09 NOTE — Assessment & Plan Note (Signed)
Found on CT calcium scan of the heart, radiology suggests dedicated MRI of the abdomen with Gadolinium to evaluate.

## 2021-04-09 NOTE — Assessment & Plan Note (Signed)
She had COVID a couple months ago. She is encouraged to take the bivalent booster vaccine before she travels for the holidays

## 2021-04-09 NOTE — Assessment & Plan Note (Signed)
With neck pain and unsteady gait. Referred to physical therapy for evaluation and treatment. She does note some disequilibrium and tinnitus in left ear.

## 2021-04-09 NOTE — Assessment & Plan Note (Signed)
Supplement and monitor 

## 2021-04-09 NOTE — Assessment & Plan Note (Signed)
Well controlled, no changes to meds. Encouraged heart healthy diet such as the DASH diet and exercise as tolerated.  °

## 2021-04-09 NOTE — Assessment & Plan Note (Signed)
hgba1c acceptable, minimize simple carbs. Increase exercise as tolerated.  

## 2021-04-09 NOTE — Patient Instructions (Signed)
Wegovy or Saxenda injectables for weight loss.   DASH Eating Plan DASH stands for Dietary Approaches to Stop Hypertension. The DASH eating plan is a healthy eating plan that has been shown to: Reduce high blood pressure (hypertension). Reduce your risk for type 2 diabetes, heart disease, and stroke. Help with weight loss. What are tips for following this plan? Reading food labels Check food labels for the amount of salt (sodium) per serving. Choose foods with less than 5 percent of the Daily Value of sodium. Generally, foods with less than 300 milligrams (mg) of sodium per serving fit into this eating plan. To find whole grains, look for the word "whole" as the first word in the ingredient list. Shopping Buy products labeled as "low-sodium" or "no salt added." Buy fresh foods. Avoid canned foods and pre-made or frozen meals. Cooking Avoid adding salt when cooking. Use salt-free seasonings or herbs instead of table salt or sea salt. Check with your health care provider or pharmacist before using salt substitutes. Do not fry foods. Cook foods using healthy methods such as baking, boiling, grilling, roasting, and broiling instead. Cook with heart-healthy oils, such as olive, canola, avocado, soybean, or sunflower oil. Meal planning  Eat a balanced diet that includes: 4 or more servings of fruits and 4 or more servings of vegetables each day. Try to fill one-half of your plate with fruits and vegetables. 6-8 servings of whole grains each day. Less than 6 oz (170 g) of lean meat, poultry, or fish each day. A 3-oz (85-g) serving of meat is about the same size as a deck of cards. One egg equals 1 oz (28 g). 2-3 servings of low-fat dairy each day. One serving is 1 cup (237 mL). 1 serving of nuts, seeds, or beans 5 times each week. 2-3 servings of heart-healthy fats. Healthy fats called omega-3 fatty acids are found in foods such as walnuts, flaxseeds, fortified milks, and eggs. These fats are also  found in cold-water fish, such as sardines, salmon, and mackerel. Limit how much you eat of: Canned or prepackaged foods. Food that is high in trans fat, such as some fried foods. Food that is high in saturated fat, such as fatty meat. Desserts and other sweets, sugary drinks, and other foods with added sugar. Full-fat dairy products. Do not salt foods before eating. Do not eat more than 4 egg yolks a week. Try to eat at least 2 vegetarian meals a week. Eat more home-cooked food and less restaurant, buffet, and fast food. Lifestyle When eating at a restaurant, ask that your food be prepared with less salt or no salt, if possible. If you drink alcohol: Limit how much you use to: 0-1 drink a day for women who are not pregnant. 0-2 drinks a day for men. Be aware of how much alcohol is in your drink. In the [place] one drink equals one 12 oz bottle of beer (355 mL), one 5 oz glass of wine (148 mL), or one 1 oz glass of hard liquor (44 mL). General information Avoid eating more than 2,300 mg of salt a day. If you have hypertension, you may need to reduce your sodium intake to 1,500 mg a day. Work with your health care provider to maintain a healthy body weight or to lose weight. Ask what an ideal weight is for you. Get at least 30 minutes of exercise that causes your heart to beat faster (aerobic exercise) most days of the week. Activities may include walking, swimming, or biking.  Work with your health care provider or dietitian to adjust your eating plan to your individual calorie needs. What foods should I eat? Fruits All fresh, dried, or frozen fruit. Canned fruit in natural juice (without added sugar). Vegetables Fresh or frozen vegetables (raw, steamed, roasted, or grilled). Low-sodium or reduced-sodium tomato and vegetable juice. Low-sodium or reduced-sodium tomato sauce and tomato paste. Low-sodium or reduced-sodium canned vegetables. Grains Whole-grain or whole-wheat bread. Whole-grain  or whole-wheat pasta. Brown rice. [place]. Bulgur. Whole-grain and low-sodium cereals. Pita bread. Low-fat, low-sodium crackers. Whole-wheat flour tortillas. Meats and other proteins Skinless chicken or [place]. Ground chicken or [place]. Pork with fat trimmed off. Fish and seafood. Egg whites. Dried beans, peas, or lentils. Unsalted nuts, nut butters, and seeds. Unsalted canned beans. Lean cuts of beef with fat trimmed off. Low-sodium, lean precooked or cured meat, such as sausages or meat loaves. Dairy Low-fat (1%) or fat-free (skim) milk. Reduced-fat, low-fat, or fat-free cheeses. Nonfat, low-sodium ricotta or cottage cheese. Low-fat or nonfat yogurt. Low-fat, low-sodium cheese. Fats and oils Soft margarine without trans fats. Vegetable oil. Reduced-fat, low-fat, or light mayonnaise and salad dressings (reduced-sodium). Canola, safflower, olive, avocado, soybean, and sunflower oils. Avocado. Seasonings and condiments Herbs. Spices. Seasoning mixes without salt. Other foods Unsalted popcorn and pretzels. Fat-free sweets. The items listed above may not be a complete list of foods and beverages you can eat. Contact a dietitian for more information. What foods should I avoid? Fruits Canned fruit in a light or heavy syrup. Fried fruit. Fruit in cream or butter sauce. Vegetables Creamed or fried vegetables. Vegetables in a cheese sauce. Regular canned vegetables (not low-sodium or reduced-sodium). Regular canned tomato sauce and paste (not low-sodium or reduced-sodium). Regular tomato and vegetable juice (not low-sodium or reduced-sodium). Angie Fava. Olives. Grains Baked goods made with fat, such as croissants, muffins, or some breads. Dry pasta or rice meal packs. Meats and other proteins Fatty cuts of meat. Ribs. Fried meat. Berniece Salines. Bologna, salami, and other precooked or cured meats, such as sausages or meat loaves. Fat from the back of a pig (fatback). Bratwurst. Salted nuts and seeds. Canned  beans with added salt. Canned or smoked fish. Whole eggs or egg yolks. Chicken or [place] with skin. Dairy Whole or 2% milk, cream, and half-and-half. Whole or full-fat cream cheese. Whole-fat or sweetened yogurt. Full-fat cheese. Nondairy creamers. Whipped toppings. Processed cheese and cheese spreads. Fats and oils Butter. Stick margarine. Lard. Shortening. Ghee. Bacon fat. Tropical oils, such as coconut, palm kernel, or palm oil. Seasonings and condiments Onion salt, garlic salt, seasoned salt, table salt, and sea salt. Worcestershire sauce. Tartar sauce. Barbecue sauce. Teriyaki sauce. Soy sauce, including reduced-sodium. Steak sauce. Canned and packaged gravies. Fish sauce. Oyster sauce. Cocktail sauce. Store-bought horseradish. Ketchup. Mustard. Meat flavorings and tenderizers. Bouillon cubes. Hot sauces. Pre-made or packaged marinades. Pre-made or packaged taco seasonings. Relishes. Regular salad dressings. Other foods Salted popcorn and pretzels. The items listed above may not be a complete list of foods and beverages you should avoid. Contact a dietitian for more information. Where to find more information National Heart, Lung, and Blood Institute: https://wilson-eaton.com/ American Heart Association: www.heart.org Academy of Nutrition and Dietetics: www.eatright.[place]: www.kidney.org Summary The DASH eating plan is a healthy eating plan that has been shown to reduce high blood pressure (hypertension). It may also reduce your risk for type 2 diabetes, heart disease, and stroke. When on the DASH eating plan, aim to eat more fresh fruits and vegetables, whole grains, lean  proteins, low-fat dairy, and heart-healthy fats. With the DASH eating plan, you should limit salt (sodium) intake to 2,300 mg a day. If you have hypertension, you may need to reduce your sodium intake to 1,500 mg a day. Work with your health care provider or dietitian to adjust your eating plan to your  individual calorie needs. This information is not intended to replace advice given to you by your health care provider. Make sure you discuss any questions you have with your health care provider. Document Revised: 04/23/2019 Document Reviewed: 04/23/2019 Elsevier Patient Education  2022 [employer].

## 2021-04-09 NOTE — Assessment & Plan Note (Signed)
Encouraged DASH or MIND diet, decrease po intake and increase exercise as tolerated. Needs 7-8 hours of sleep nightly. Avoid trans fats, eat small, frequent meals every 4-5 hours with lean proteins, complex carbs and healthy fats. Minimize simple carbs, high fat foods and processed foods. Consider Wegovy or Saxenda °

## 2021-04-14 ENCOUNTER — Ambulatory Visit (HOSPITAL_BASED_OUTPATIENT_CLINIC_OR_DEPARTMENT_OTHER)
Admission: RE | Admit: 2021-04-14 | Discharge: 2021-04-14 | Disposition: A | Discharge status: Home / Self Care | Payer: Medicare PPO | Source: Ambulatory Visit | Attending: Family Medicine | Admitting: Family Medicine

## 2021-04-14 ENCOUNTER — Other Ambulatory Visit: Payer: Self-pay

## 2021-04-14 DIAGNOSIS — K769 Liver disease, unspecified: Secondary | ICD-10-CM | POA: Insufficient documentation

## 2021-04-14 DIAGNOSIS — K760 Fatty (change of) liver, not elsewhere classified: Secondary | ICD-10-CM | POA: Diagnosis not present

## 2021-04-14 DIAGNOSIS — N281 Cyst of kidney, acquired: Secondary | ICD-10-CM | POA: Diagnosis not present

## 2021-04-14 MED ORDER — GADOBUTROL 1 MMOL/ML IV SOLN
10.0000 mL | Freq: Once | INTRAVENOUS | Status: AC | PRN
  Administered 2021-04-14: 10 mL via INTRAVENOUS

## 2021-04-19 DIAGNOSIS — R519 Headache, unspecified: Secondary | ICD-10-CM | POA: Diagnosis not present

## 2021-04-19 DIAGNOSIS — J301 Allergic rhinitis due to pollen: Secondary | ICD-10-CM | POA: Diagnosis not present

## 2021-04-19 DIAGNOSIS — J3089 Other allergic rhinitis: Secondary | ICD-10-CM | POA: Diagnosis not present

## 2021-04-19 DIAGNOSIS — H1045 Other chronic allergic conjunctivitis: Secondary | ICD-10-CM | POA: Diagnosis not present

## 2021-05-02 DIAGNOSIS — G4733 Obstructive sleep apnea (adult) (pediatric): Secondary | ICD-10-CM | POA: Diagnosis not present

## 2021-05-04 ENCOUNTER — Encounter: Payer: Self-pay | Admitting: Physical Therapy

## 2021-05-04 ENCOUNTER — Ambulatory Visit: Payer: Medicare PPO | Admitting: Physical Therapy

## 2021-05-04 ENCOUNTER — Other Ambulatory Visit: Payer: Self-pay

## 2021-05-04 DIAGNOSIS — R2681 Unsteadiness on feet: Secondary | ICD-10-CM

## 2021-05-04 DIAGNOSIS — R42 Dizziness and giddiness: Secondary | ICD-10-CM
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
## 2021-05-04 NOTE — Patient Instructions (Signed)
Access Code: [email or web address] URL: [email or web address] Date: 05/04/2021 Prepared by: Moshe Cipro  Exercises Brandt-Daroff Vestibular Exercise - 2 x daily - 7 x weekly - 1 sets - 3-5 reps Romberg Stance Eyes Closed on Foam Pad - 2 x daily - 7 x weekly - 1 sets - 5 reps - 10-15 sec hold Standing Gaze Stabilization with Head Rotation - 2 x daily - 7 x weekly - 1 sets - 30 seconds

## 2021-05-09 ENCOUNTER — Other Ambulatory Visit: Payer: Self-pay

## 2021-05-09 ENCOUNTER — Ambulatory Visit: Payer: Medicare PPO | Admitting: Physical Therapy

## 2021-05-09 ENCOUNTER — Encounter: Payer: Self-pay | Admitting: Physical Therapy

## 2021-05-09 DIAGNOSIS — R42 Dizziness and giddiness: Secondary | ICD-10-CM

## 2021-05-09 DIAGNOSIS — R2681 Unsteadiness on feet: Secondary | ICD-10-CM
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
## 2021-05-16 ENCOUNTER — Encounter: Payer: Self-pay | Admitting: Physical Therapy

## 2021-05-16 ENCOUNTER — Other Ambulatory Visit: Payer: Self-pay

## 2021-05-16 ENCOUNTER — Ambulatory Visit: Payer: Medicare PPO | Admitting: Physical Therapy

## 2021-05-16 DIAGNOSIS — R2681 Unsteadiness on feet: Secondary | ICD-10-CM

## 2021-05-16 DIAGNOSIS — R42 Dizziness and giddiness: Secondary | ICD-10-CM | POA: Diagnosis not present
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
## 2021-05-17 DIAGNOSIS — J3081 Allergic rhinitis due to animal (cat) (dog) hair and dander: Secondary | ICD-10-CM | POA: Diagnosis not present

## 2021-05-17 DIAGNOSIS — J3089 Other allergic rhinitis: Secondary | ICD-10-CM | POA: Diagnosis not present

## 2021-05-17 DIAGNOSIS — J301 Allergic rhinitis due to pollen: Secondary | ICD-10-CM | POA: Diagnosis not present

## 2021-05-24 ENCOUNTER — Ambulatory Visit: Payer: Medicare PPO | Admitting: Physical Therapy

## 2021-05-24 ENCOUNTER — Other Ambulatory Visit: Payer: Self-pay

## 2021-05-24 ENCOUNTER — Encounter: Payer: Self-pay | Admitting: Physical Therapy

## 2021-05-24 DIAGNOSIS — R42 Dizziness and giddiness: Secondary | ICD-10-CM | POA: Diagnosis not present

## 2021-05-24 DIAGNOSIS — R2681 Unsteadiness on feet: Secondary | ICD-10-CM | POA: Diagnosis not present
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
## 2021-05-24 NOTE — Patient Instructions (Signed)
Access Code: [email or web address] URL: [email or web address] Date: 05/24/2021 Prepared by: Moshe Cipro  Exercises Brandt-Daroff Vestibular Exercise - 2 x daily - 7 x weekly - 1 sets - 3-5 reps Romberg Stance Eyes Closed on Foam Pad - 2 x daily - 7 x weekly - 1 sets - 5 reps - 10-15 sec hold Standing Gaze Stabilization with Head Rotation - 2 x daily - 7 x weekly - 1 sets - 30 seconds Romberg Stance on Foam Pad with Head Rotation - 2 x daily - 7 x weekly - 1 sets - 1 reps - 10 rotations each way

## 2021-05-29 ENCOUNTER — Other Ambulatory Visit: Payer: Self-pay | Admitting: Family Medicine

## 2021-05-29 DIAGNOSIS — J309 Allergic rhinitis, unspecified: Secondary | ICD-10-CM

## 2021-05-29 DIAGNOSIS — R059 Cough, unspecified: Secondary | ICD-10-CM

## 2021-05-31 ENCOUNTER — Encounter: Payer: Medicare PPO | Admitting: Physical Therapy

## 2021-06-05 DIAGNOSIS — J3081 Allergic rhinitis due to animal (cat) (dog) hair and dander: Secondary | ICD-10-CM | POA: Diagnosis not present

## 2021-06-05 DIAGNOSIS — J3089 Other allergic rhinitis: Secondary | ICD-10-CM | POA: Diagnosis not present

## 2021-06-05 DIAGNOSIS — J301 Allergic rhinitis due to pollen: Secondary | ICD-10-CM | POA: Diagnosis not present

## 2021-06-12 ENCOUNTER — Ambulatory Visit (INDEPENDENT_AMBULATORY_CARE_PROVIDER_SITE_OTHER): Payer: Medicare PPO

## 2021-06-12 VITALS — Ht 64.5 in | Wt 274.0 lb

## 2021-06-12 DIAGNOSIS — Z0000 Encounter for general adult medical examination without abnormal findings: Secondary | ICD-10-CM | POA: Diagnosis not present

## 2021-06-12 DIAGNOSIS — J3089 Other allergic rhinitis: Secondary | ICD-10-CM | POA: Diagnosis not present

## 2021-06-12 DIAGNOSIS — J301 Allergic rhinitis due to pollen: Secondary | ICD-10-CM | POA: Diagnosis not present

## 2021-06-12 DIAGNOSIS — J3081 Allergic rhinitis due to animal (cat) (dog) hair and dander: Secondary | ICD-10-CM | POA: Diagnosis not present

## 2021-06-12 NOTE — Patient Instructions (Signed)
Terri [name] , Thank you for taking time to complete your Medicare Wellness Visit. I appreciate your ongoing commitment to your health goals. Please review the following plan we discussed and let me know if I can assist you in the future.   Screening recommendations/referrals: Colonoscopy: No longer required Mammogram: Completed 07/06/2020-Due 07/06/2021 Bone Density: Due-Declined today. Recommended yearly ophthalmology/optometry visit for glaucoma screening and checkup Recommended yearly dental visit for hygiene and checkup  Vaccinations: Influenza vaccine: Declined Pneumococcal vaccine: Declined Tdap vaccine: Declined Shingles vaccine: Declined   Covid-19:Booster available at the pharmacy  Advanced directives: Information mailed  Conditions/risks identified: See problem list  Next appointment: Follow up in one year for your annual wellness visit    Preventive Care 69 Years and Older, Female Preventive care refers to lifestyle choices and visits with your health care provider that can promote health and wellness. What does preventive care include? A yearly physical exam. This is also called an annual well check. Dental exams once or twice a year. Routine eye exams. Ask your health care provider how often you should have your eyes checked. Personal lifestyle choices, including: Daily care of your teeth and gums. Regular physical activity. Eating a healthy diet. Avoiding tobacco and drug use. Limiting alcohol use. Practicing safe sex. Taking low-dose aspirin every day. Taking vitamin and mineral supplements as recommended by your health care provider. What happens during an annual well check? The services and screenings done by your health care provider during your annual well check will depend on your age, overall health, lifestyle risk factors, and family history of disease. Counseling  Your health care provider may ask you questions about your: Alcohol use. Tobacco use. Drug  use. Emotional well-being. Home and relationship well-being. Sexual activity. Eating habits. History of falls. Memory and ability to understand (cognition). Work and work Astronomer. Reproductive health. Screening  You may have the following tests or measurements: Height, weight, and BMI. Blood pressure. Lipid and cholesterol levels. These may be checked every 5 years, or more frequently if you are over 69 years old. Skin check. Lung cancer screening. You may have this screening every year starting at age 69 if you have a 30-pack-year history of smoking and currently smoke or have quit within the past 15 years. Fecal occult blood test (FOBT) of the stool. You may have this test every year starting at age 69. Flexible sigmoidoscopy or colonoscopy. You may have a sigmoidoscopy every 5 years or a colonoscopy every 10 years starting at age 69. Hepatitis C blood test. Hepatitis B blood test. Sexually transmitted disease (STD) testing. Diabetes screening. This is done by checking your blood sugar (glucose) after you have not eaten for a while (fasting). You may have this done every 1-3 years. Bone density scan. This is done to screen for osteoporosis. You may have this done starting at age 69. Mammogram. This may be done every 1-2 years. Talk to your health care provider about how often you should have regular mammograms. Talk with your health care provider about your test results, treatment options, and if necessary, the need for more tests. Vaccines  Your health care provider may recommend certain vaccines, such as: Influenza vaccine. This is recommended every year. Tetanus, diphtheria, and acellular pertussis (Tdap, Td) vaccine. You may need a Td booster every 10 years. Zoster vaccine. You may need this after age 69. Pneumococcal 13-valent conjugate (PCV13) vaccine. One dose is recommended after age 69. Pneumococcal polysaccharide (PPSV23) vaccine. One dose is recommended after age  69.  Talk to your health care provider about which screenings and vaccines you need and how often you need them. This information is not intended to replace advice given to you by your health care provider. Make sure you discuss any questions you have with your health care provider. Document Released: 06/16/2015 Document Revised: 02/07/2016 Document Reviewed: 03/21/2015 Elsevier Interactive Patient Education  2017 [place] Prevention in the Home Falls can cause injuries. They can happen to people of all ages. There are many things you can do to make your home safe and to help prevent falls. What can I do on the outside of my home? Regularly fix the edges of walkways and driveways and fix any cracks. Remove anything that might make you trip as you walk through a door, such as a raised step or threshold. Trim any bushes or trees on the path to your home. Use bright outdoor lighting. Clear any walking paths of anything that might make someone trip, such as rocks or tools. Regularly check to see if handrails are loose or broken. Make sure that both sides of any steps have handrails. Any raised decks and porches should have guardrails on the edges. Have any leaves, snow, or ice cleared regularly. Use sand or salt on walking paths during winter. Clean up any spills in your garage right away. This includes oil or grease spills. What can I do in the bathroom? Use night lights. Install grab bars by the toilet and in the tub and shower. Do not use towel bars as grab bars. Use non-skid mats or decals in the tub or shower. If you need to sit down in the shower, use a plastic, non-slip stool. Keep the floor dry. Clean up any water that spills on the floor as soon as it happens. Remove soap buildup in the tub or shower regularly. Attach bath mats securely with double-sided non-slip rug tape. Do not have throw rugs and other things on the floor that can make you trip. What can I do in the  bedroom? Use night lights. Make sure that you have a light by your bed that is easy to reach. Do not use any sheets or blankets that are too big for your bed. They should not hang down onto the floor. Have a firm chair that has side arms. You can use this for support while you get dressed. Do not have throw rugs and other things on the floor that can make you trip. What can I do in the kitchen? Clean up any spills right away. Avoid walking on wet floors. Keep items that you use a lot in easy-to-reach places. If you need to reach something above you, use a strong step stool that has a grab bar. Keep electrical cords out of the way. Do not use floor polish or wax that makes floors slippery. If you must use wax, use non-skid floor wax. Do not have throw rugs and other things on the floor that can make you trip. What can I do with my stairs? Do not leave any items on the stairs. Make sure that there are handrails on both sides of the stairs and use them. Fix handrails that are broken or loose. Make sure that handrails are as long as the stairways. Check any carpeting to make sure that it is firmly attached to the stairs. Fix any carpet that is loose or worn. Avoid having throw rugs at the top or bottom of the stairs. If you do have throw rugs,  attach them to the floor with carpet tape. Make sure that you have a light switch at the top of the stairs and the bottom of the stairs. If you do not have them, ask someone to add them for you. What else can I do to help prevent falls? Wear shoes that: Do not have high heels. Have rubber bottoms. Are comfortable and fit you well. Are closed at the toe. Do not wear sandals. If you use a stepladder: Make sure that it is fully opened. Do not climb a closed stepladder. Make sure that both sides of the stepladder are locked into place. Ask someone to hold it for you, if possible. Clearly mark and make sure that you can see: Any grab bars or  handrails. First and last steps. Where the edge of each step is. Use tools that help you move around (mobility aids) if they are needed. These include: Canes. Walkers. Scooters. Crutches. Turn on the lights when you go into a dark area. Replace any light bulbs as soon as they burn out. Set up your furniture so you have a clear path. Avoid moving your furniture around. If any of your floors are uneven, fix them. If there are any pets around you, be aware of where they are. Review your medicines with your doctor. Some medicines can make you feel dizzy. This can increase your chance of falling. Ask your doctor what other things that you can do to help prevent falls. This information is not intended to replace advice given to you by your health care provider. Make sure you discuss any questions you have with your health care provider. Document Released: 03/16/2009 Document Revised: 10/26/2015 Document Reviewed: 06/24/2014 Elsevier Interactive Patient Education  2017 [employer].

## 2021-06-12 NOTE — Progress Notes (Signed)
Subjective:   Terri [name] is a 71 y.o. female who presents for an Initial Medicare Annual Wellness Visit.  I connected with Kimoni today by telephone and verified that I am speaking with the correct person using two identifiers. Location patient: home Location provider: work Persons participating in the virtual visit: patient, Marine scientist.    I discussed the limitations, risks, security and privacy concerns of performing an evaluation and management service by telephone and the availability of in person appointments. I also discussed with the patient that there may be a patient responsible charge related to this service. The patient expressed understanding and verbally consented to this telephonic visit.    Interactive audio and video telecommunications were attempted between this provider and patient, however failed, due to patient having technical difficulties OR patient did not have access to video capability.  We continued and completed visit with audio only.  Some vital signs may be absent or patient reported.   Time Spent with patient on telephone encounter: 20 minutes  Review of Systems     Cardiac Risk Factors include: advanced age (>60men, >44 women);hypertension;dyslipidemia;obesity (BMI >30kg/m2);sedentary lifestyle     Objective:    Today's Vitals   06/12/21 1306  Weight: 274 lb (124.3 kg)  Height: 5' 4.5" (1.638 m)   Body mass index is 46.31 kg/m.  Advanced Directives 06/12/2021 05/04/2021  Does Patient Have a Medical Advance Directive? No No  Would patient like information on creating a medical advance directive? No - Patient declined No - Patient declined    Current Medications (verified) Outpatient Encounter Medications as of 06/12/2021  Medication Sig   albuterol (VENTOLIN HFA) 108 (90 Base) MCG/ACT inhaler Inhale 1-2 puffs into the lungs every 6 (six) hours as needed for wheezing or shortness of breath.   amLODipine-olmesartan (AZOR) 10-40 MG tablet TAKE 1  TABLET BY MOUTH EVERY DAY   budesonide-formoterol (SYMBICORT) 80-4.5 MCG/ACT inhaler Inhale 2 puffs into the lungs See admin instructions. Twice monthly   DEXILANT 60 MG capsule TAKE 1 CAPSULE (60 MG TOTAL) BY MOUTH DAILY BEFORE BREAKFAST. (Patient taking differently: Take 60 mg by mouth daily.)   EPINEPHrine 0.3 mg/0.3 mL IJ SOAJ injection Inject 1 Dose into the muscle as needed for allergies. Inject after allergy shot for anaphylaxis   famotidine (PEPCID) 40 MG tablet TAKE 1 TABLET BY MOUTH EVERY DAY (Patient taking differently: Take 40 mg by mouth daily.)   fluticasone (FLONASE) 50 MCG/ACT nasal spray Place 1 spray into both nostrils daily as needed for allergies or rhinitis.   fluticasone (FLOVENT HFA) 220 MCG/ACT inhaler Inhale 1 puff into the lungs in the morning and at bedtime. (Patient taking differently: Inhale 1 puff into the lungs as needed (allergies/wheezing).)   levocetirizine (XYZAL) 5 MG tablet Take 5 mg by mouth every evening.   montelukast (SINGULAIR) 10 MG tablet TAKE 1 TABLET BY MOUTH EVERYDAY AT BEDTIME   oxybutynin (DITROPAN-XL) 10 MG 24 hr tablet Take 10 mg by mouth daily.    furosemide (LASIX) 20 MG tablet Take 1 tablet (20 mg total) by mouth daily.   metoprolol tartrate (LOPRESSOR) 100 MG tablet Take 1 tablet (100 mg total) by mouth once for 1 dose. 2 hours before ct   No facility-administered encounter medications on file as of 06/12/2021.    Allergies (verified) Sulfamethoxazole-trimethoprim, Sulfonamide derivatives, and Sulfa antibiotics   History: Past Medical History:  Diagnosis Date   Allergic rhinitis    Apnea, sleep 06/28/2012   Previously used CPAP   Asthma  Atypical chest pain 01/14/2017   Back pain    Decreased visual acuity 08/05/2016   Depression 06/19/2015   Environmental and seasonal allergies    GERD (gastroesophageal reflux disease)    Heartburn    Hip pain, right 06/28/2012   HTN (hypertension)    Hypokalemia 01/14/2017   Leg pain    Morbid  obesity ([place]) 04/11/2008   Qualifier: Diagnosis of  By: Wynona Luna    Numbness in feet 05/06/2016   Obesity    Osteoarthritis 01/16/2010   Qualifier: Diagnosis of  By: Wynona Luna    Preventative health care 06/25/2015   Shortness of breath    Shortness of breath on exertion    Sleep apnea    Swelling of extremity    Tinnitus    Vertigo    Past Surgical History:  Procedure Laterality Date   BIOPSY BREAST  1997   left    childbirth     COLONOSCOPY  2004   UPPER GASTROINTESTINAL ENDOSCOPY  03/01/2019   Family History  Problem Relation Age of Onset   Stroke Mother    Liver disease Mother        hepatitis b   Hypertension Mother    Cancer Father        esophagus, prostate   Hypertension Father    Heart disease Father    Esophageal cancer Father    Liver disease Brother        hep c   Mental illness Brother        PTSD from [place]   Stroke Maternal Grandmother    Cancer Paternal Grandfather        prostate   Colitis Neg Hx    Colon cancer Neg Hx    Rectal cancer Neg Hx    Stomach cancer Neg Hx    Social History   Socioeconomic History   Marital status: Divorced    Spouse name: Not on file   Number of children: 1   Years of education: Not on file   Highest education level: Not on file  Occupational History   Occupation: HS music teacher- [employer]  Tobacco Use   Smoking status: Never   Smokeless tobacco: Never  Vaping Use   Vaping Use: Never used  Substance and Sexual Activity   Alcohol use: Yes    Comment: social   Drug use: No   Sexual activity: Not on file    Comment: band teacher, 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 no dietary restrictions  Other Topics Concern   Not on file  Social History Narrative   Single/divorced   1 son born 46 - sometimes there w/ her, leukemia 2016   Music teacher [employer]   Caffeine use 1 cup AM   Never smoker/tobacco   No drugs   Rare-occ EtOHSmoking    Social Determinants of Health   Financial  Resource Strain: Low Risk    Difficulty of Paying Living Expenses: Not hard at all  Food Insecurity: No Food Insecurity   Worried About Charity fundraiser in the Last Year: Never true   Arboriculturist in the Last Year: Never true  Transportation Needs: No Transportation Needs   Lack of Transportation (Medical): No   Lack of Transportation (Non-Medical): No  Physical Activity: Inactive   Days of Exercise per Week: 0 days   Minutes of Exercise per Session: 0 min  Stress: No Stress Concern Present   Feeling of Stress :  Not at all  Social Connections: Moderately Integrated   Frequency of Communication with Friends and Family: More than three times a week   Frequency of Social Gatherings with Friends and Family: More than three times a week   Attends Religious Services: More than 4 times per year   Active Member of [employer] or Organizations: Yes   Attends Music therapist: More than 4 times per year   Marital Status: Divorced    Tobacco Counseling Counseling given: Not Answered   Clinical Intake:  Pre-visit preparation completed: Yes  Pain : No/denies pain     BMI - recorded: 46.31 Nutritional Status: BMI > 30  Obese Nutritional Risks: None Diabetes: No  How often do you need to have someone help you when you read instructions, pamphlets, or other written materials from your doctor or pharmacy?: 1 - Never  Diabetic?No  Interpreter Needed?: No  Information entered by :: Caroleen Hamman LPN   Activities of Daily Living In your present state of health, do you have any difficulty performing the following activities: 06/12/2021 06/29/2020  Hearing? N N  Vision? N N  Difficulty concentrating or making decisions? N N  Walking or climbing stairs? N N  Dressing or bathing? N N  Doing errands, shopping? N N  Preparing Food and eating ? N -  Using the Toilet? N -  In the past six months, have you accidently leaked urine? N -  Do you have problems with loss of bowel  control? N -  Managing your Medications? N -  Managing your Finances? N -  Housekeeping or managing your Housekeeping? N -  Some recent data might be hidden    Patient Care Team: [name], MD as PCP - General (Family Medicine)  Indicate any recent Medical Services you may have received from other than [facility] providers in the past year (date may be approximate).     Assessment:   This is a routine wellness examination for Javette.  Hearing/Vision screen Hearing Screening - Comments:: Hearing aid in left ear Vision Screening - Comments:: Last eye exam-4 years ago  Dietary issues and exercise activities discussed: Current Exercise Habits: The patient does not participate in regular exercise at present, Exercise limited by: None identified   Goals Addressed             This Visit's Progress    Patient Stated       Increase activity & lose some weight       Depression Screen PHQ 2/9 Scores 06/12/2021 11/28/2020 08/16/2019 12/22/2017 11/17/2017 08/11/2017 05/06/2016  PHQ - 2 Score 0 0 0 1 0 2 0  PHQ- 9 Score - - 1 6 - 5 -    Fall Risk Fall Risk  06/12/2021 11/28/2020 06/29/2020 05/06/2016  Falls in the past year? 0 1 0 No  Number falls in past yr: 0 1 0 -  Injury with Fall? 0 0 0 -  Follow up Falls prevention discussed - - -    FALL RISK PREVENTION PERTAINING TO THE HOME:  Any stairs in or around the home? No  Home free of loose throw rugs in walkways, pet beds, electrical cords, etc? Yes  Adequate lighting in your home to reduce risk of falls? Yes   ASSISTIVE DEVICES UTILIZED TO PREVENT FALLS:  Life alert? No  Use of a cane, walker or w/c? Yes cane occasionally with long distances Grab bars in the bathroom? No  Shower chair or bench in shower? No  Elevated toilet seat or a handicapped toilet? No   TIMED UP AND GO:  Was the test performed? No . Phone visit   Cognitive Function:Normal cognitive status assessed by this Nurse Health Advisor. No abnormalities found.           Immunizations Immunization History  Administered Date(s) Administered   PFIZER Comirnaty(Gray Top)Covid-19 Tri-Sucrose Vaccine 07/18/2020, 08/18/2020   Td 11/29/2008    TDAP status: Due, Education has been provided regarding the importance of this vaccine. Advised may receive this vaccine at local pharmacy or Health Dept. Aware to provide a copy of the vaccination record if obtained from local pharmacy or Health Dept. Verbalized acceptance and understanding.  Flu Vaccine status: Declined, Education has been provided regarding the importance of this vaccine but patient still declined. Advised may receive this vaccine at local pharmacy or Health Dept. Aware to provide a copy of the vaccination record if obtained from local pharmacy or Health Dept. Verbalized acceptance and understanding.  Pneumococcal vaccine status: Declined,  Education has been provided regarding the importance of this vaccine but patient still declined. Advised may receive this vaccine at local pharmacy or Health Dept. Aware to provide a copy of the vaccination record if obtained from local pharmacy or Health Dept. Verbalized acceptance and understanding.   Covid-19 vaccine status: Completed vaccines  Qualifies for Shingles Vaccine? Yes   Zostavax completed No   Shingrix Completed?: No.    Education has been provided regarding the importance of this vaccine. Patient has been advised to call insurance company to determine out of pocket expense if they have not yet received this vaccine. Advised may also receive vaccine at local pharmacy or Health Dept. Verbalized acceptance and understanding.  Screening Tests Health Maintenance  Topic Date Due   COVID-19 Vaccine (3 - Booster for Pfizer series) 10/13/2020   INFLUENZA VACCINE  08/31/2021 (Originally 01/01/2021)   TETANUS/TDAP  11/28/2021 (Originally 11/30/2018)   Zoster Vaccines- Shingrix (1 of 2) 02/28/2022 (Originally 10/13/2002)   Pneumonia Vaccine 67+ Years old  (1 - PCV) 04/09/2022 (Originally 10/13/1956)   MAMMOGRAM  07/06/2022   COLONOSCOPY (Pts 45-32yrs Insurance coverage will need to be confirmed)  02/28/2029   DEXA SCAN  Completed   Hepatitis C Screening  Completed   HPV VACCINES  Aged Out    Health Maintenance  Health Maintenance Due  Topic Date Due   COVID-19 Vaccine (3 - Booster for [place] series) 10/13/2020    Colorectal cancer screening: No longer required.   Mammogram status: Completed bilateral 07/06/2020. Repeat every year  Bone Density status: Declined  Lung Cancer Screening: (Low Dose CT Chest recommended if Age 77-80 years, 30 pack-year currently smoking OR have quit w/in 15years.) does not qualify.     Additional Screening:  Hepatitis C Screening: Completed 06/19/2015  Vision Screening: Recommended annual ophthalmology exams for early detection of glaucoma and other disorders of the eye. Is the patient up to date with their annual eye exam?  No  Who is the provider or what is the name of the office in which the patient attends annual eye exams? None-patient plans to make ana ppt soon   Dental Screening: Recommended annual dental exams for proper oral hygiene  Community Resource Referral / Chronic Care Management: CRR required this visit?  No   CCM required this visit?  No      Plan:     I have personally reviewed and noted the following in the patients chart:   Medical and social history Use of alcohol,  tobacco or illicit drugs  Current medications and supplements including opioid prescriptions. Patient is not currently taking opioid prescriptions. Functional ability and status Nutritional status Physical activity Advanced directives List of other physicians Hospitalizations, surgeries, and ER visits in previous 12 months Vitals Screenings to include cognitive, depression, and falls Referrals and appointments  In addition, I have reviewed and discussed with patient certain preventive protocols,  quality metrics, and best practice recommendations. A written personalized care plan for preventive services as well as general preventive health recommendations were provided to patient.   Due to this being a telephonic visit, the after visit summary with patients personalized plan was offered to patient via mail or my-chart. Patient would like to access on my-chart.   Marta Antu, LPN   [id number]  Nurse Health Advisor  Nurse Notes: None

## 2021-06-15 DIAGNOSIS — J301 Allergic rhinitis due to pollen: Secondary | ICD-10-CM | POA: Diagnosis not present

## 2021-06-15 DIAGNOSIS — J3089 Other allergic rhinitis: Secondary | ICD-10-CM | POA: Diagnosis not present

## 2021-06-15 DIAGNOSIS — J3081 Allergic rhinitis due to animal (cat) (dog) hair and dander: Secondary | ICD-10-CM | POA: Diagnosis not present

## 2021-06-20 DIAGNOSIS — J4540 Moderate persistent asthma, uncomplicated: Secondary | ICD-10-CM | POA: Insufficient documentation

## 2021-06-21 ENCOUNTER — Other Ambulatory Visit: Payer: Self-pay

## 2021-06-21 ENCOUNTER — Ambulatory Visit: Payer: Medicare PPO | Admitting: Cardiology

## 2021-06-21 ENCOUNTER — Encounter: Payer: Self-pay | Admitting: Cardiology

## 2021-06-21 VITALS — BP 124/76 | HR 73 | Ht 64.5 in | Wt 271.0 lb

## 2021-06-21 DIAGNOSIS — R0609 Other forms of dyspnea: Secondary | ICD-10-CM | POA: Diagnosis not present

## 2021-06-21 DIAGNOSIS — R0600 Dyspnea, unspecified: Secondary | ICD-10-CM

## 2021-06-21 DIAGNOSIS — I10 Essential (primary) hypertension: Secondary | ICD-10-CM | POA: Diagnosis not present

## 2021-06-21 DIAGNOSIS — J3081 Allergic rhinitis due to animal (cat) (dog) hair and dander: Secondary | ICD-10-CM | POA: Diagnosis not present

## 2021-06-21 DIAGNOSIS — G4733 Obstructive sleep apnea (adult) (pediatric): Secondary | ICD-10-CM

## 2021-06-21 DIAGNOSIS — M7989 Other specified soft tissue disorders: Secondary | ICD-10-CM | POA: Diagnosis not present

## 2021-06-21 DIAGNOSIS — E669 Obesity, unspecified: Secondary | ICD-10-CM | POA: Diagnosis not present

## 2021-06-21 DIAGNOSIS — R079 Chest pain, unspecified: Secondary | ICD-10-CM

## 2021-06-21 DIAGNOSIS — J3089 Other allergic rhinitis: Secondary | ICD-10-CM | POA: Diagnosis not present

## 2021-06-21 DIAGNOSIS — J301 Allergic rhinitis due to pollen: Secondary | ICD-10-CM | POA: Diagnosis not present

## 2021-06-21 NOTE — Progress Notes (Signed)
Cardiology Office Note:    Date:  06/21/2021   ID:  Terri [name], DOB 11/06/1950, MRN [id number]  PCP:  [name], MD  Cardiologist:  Jenne Campus, MD    Referring MD: [name], MD   Chief Complaint  Patient presents with   Follow-up  I am doing fine  History of Present Illness:    Terri [name] is a 71 y.o. female who was referred to [name] because of dyspnea on exertion.  Also she had echocardiogram done which showed low normal ejection fraction which was concerning because prior echocardiogram showed normal left ventricle ejection fraction.  She also has morbid obesity, obstructive sleep apnea she used the CPAP mask over now she is some dental device, swelling of lower extremities was concerning as well.  We end up doing evaluation for coronary artery disease because some risk factors for coronary artery disease coronary CT angio calcium score 0.  No significant obstructive coronary artery disease however especially circumflex artery was not visualized presence of coronary artery disease within the circumflex artery rarely is entirely responsible for her shortness of breath especially inherent with calcium score being 0 I doubt very much there is significant obstructive disease.  We did talk about all the situation at length.  She is still described to have some shortness of breath that started getting better swelling of lower extremities still present.  Past Medical History:  Diagnosis Date   Allergic rhinitis    Apnea, sleep 06/28/2012   Previously used CPAP   Asthma    Atypical chest pain 01/14/2017   Back pain    Decreased visual acuity 08/05/2016   Depression 06/19/2015   Environmental and seasonal allergies    GERD (gastroesophageal reflux disease)    Heartburn    Hip pain, right 06/28/2012   HTN (hypertension)    Hypokalemia 01/14/2017   Leg pain    Morbid obesity ([place]) 04/11/2008   Qualifier: Diagnosis of  By: Wynona Luna    Numbness in feet  05/06/2016   Obesity    Osteoarthritis 01/16/2010   Qualifier: Diagnosis of  By: Wynona Luna    Preventative health care 06/25/2015   Shortness of breath    Shortness of breath on exertion    Sleep apnea    Swelling of extremity    Tinnitus    Vertigo     Past Surgical History:  Procedure Laterality Date   BIOPSY BREAST  1997   left    childbirth     COLONOSCOPY  2004   UPPER GASTROINTESTINAL ENDOSCOPY  03/01/2019    Current Medications: Current Meds  Medication Sig   albuterol (VENTOLIN HFA) 108 (90 Base) MCG/ACT inhaler Inhale 1-2 puffs into the lungs every 6 (six) hours as needed for wheezing or shortness of breath.   amLODipine-olmesartan (AZOR) 10-40 MG tablet TAKE 1 TABLET BY MOUTH EVERY DAY   amLODipine-olmesartan (AZOR) 10-40 MG tablet Take 1 tablet by mouth daily.   DEXILANT 60 MG capsule TAKE 1 CAPSULE (60 MG TOTAL) BY MOUTH DAILY BEFORE BREAKFAST.   EPINEPHrine 0.3 mg/0.3 mL IJ SOAJ injection Inject 1 Dose into the muscle as needed for allergies. Inject after allergy shot for anaphylaxis   famotidine (PEPCID) 40 MG tablet Take 40 mg by mouth daily.   fluticasone (FLONASE) 50 MCG/ACT nasal spray Place 1 spray into both nostrils daily as needed for allergies or rhinitis.   fluticasone (FLOVENT HFA) 220 MCG/ACT inhaler Inhale 1 puff into the  lungs in the morning and at bedtime.   furosemide (LASIX) 20 MG tablet Take 1 tablet (20 mg total) by mouth daily.   levocetirizine (XYZAL) 5 MG tablet Take 5 mg by mouth every evening.   montelukast (SINGULAIR) 10 MG tablet Take 10 mg by mouth at bedtime.   oxybutynin (DITROPAN-XL) 10 MG 24 hr tablet Take 10 mg by mouth daily.    [DISCONTINUED] famotidine (PEPCID) 40 MG tablet TAKE 1 TABLET BY MOUTH EVERY DAY     Allergies:   Sulfamethoxazole-trimethoprim, Sulfonamide derivatives, and Sulfa antibiotics   Social History   Socioeconomic History   Marital status: Divorced    Spouse name: Not on file   Number of children: 1    Years of education: Not on file   Highest education level: Not on file  Occupational History   Occupation: HS music teacher- [employer]  Tobacco Use   Smoking status: Never   Smokeless tobacco: Never  Vaping Use   Vaping Use: Never used  Substance and Sexual Activity   Alcohol use: Yes    Comment: social   Drug use: No   Sexual activity: Not on file    Comment: band teacher, 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 no dietary restrictions  Other Topics Concern   Not on file  Social History Narrative   Single/divorced   1 son born 39 - sometimes there w/ her, leukemia 2016   Music teacher [employer]   Caffeine use 1 cup AM   Never smoker/tobacco   No drugs   Rare-occ EtOHSmoking    Social Determinants of Health   Financial Resource Strain: Low Risk    Difficulty of Paying Living Expenses: Not hard at all  Food Insecurity: No Food Insecurity   Worried About Charity fundraiser in the Last Year: Never true   Arboriculturist in the Last Year: Never true  Transportation Needs: No Transportation Needs   Lack of Transportation (Medical): No   Lack of Transportation (Non-Medical): No  Physical Activity: Inactive   Days of Exercise per Week: 0 days   Minutes of Exercise per Session: 0 min  Stress: No Stress Concern Present   Feeling of Stress : Not at all  Social Connections: Moderately Integrated   Frequency of Communication with Friends and Family: More than three times a week   Frequency of Social Gatherings with Friends and Family: More than three times a week   Attends Religious Services: More than 4 times per year   Active Member of [employer] or Organizations: Yes   Attends Music therapist: More than 4 times per year   Marital Status: Divorced     Family History: The patient's family history includes Cancer in her father and paternal grandfather; Esophageal cancer in her father; Heart disease in her father; Hypertension in her father and mother; Liver disease  in her brother and mother; Mental illness in her brother; Stroke in her maternal grandmother and mother. There is no history of Colitis, Colon cancer, Rectal cancer, or Stomach cancer. ROS:   Please see the history of present illness.    All 14 point review of systems negative except as described per history of present illness  EKGs/Labs/Other Studies Reviewed:      Recent Labs: 11/28/2020: Magnesium 2.1 03/07/2021: NT-Pro BNP 21 04/09/2021: ALT 13; BUN 17; Creatinine, Ser 1.06; Hemoglobin 11.5; Platelets 363.0; Potassium 4.2; Sodium 142; TSH 3.67  Recent Lipid Panel    Component Value Date/Time   CHOL  138 04/09/2021 1148   CHOL 150 08/11/2017 1155   TRIG 134.0 04/09/2021 1148   HDL 39.90 04/09/2021 1148   HDL 52 08/11/2017 1155   CHOLHDL 3 04/09/2021 1148   VLDL 26.8 04/09/2021 1148   LDLCALC 72 04/09/2021 1148   LDLCALC 84 08/11/2017 1155    Physical Exam:    VS:  BP 124/76 (BP Location: Right Arm, Patient Position: Sitting)    Pulse 73    Ht 5' 4.5" (1.638 m)    Wt 271 lb (122.9 kg)    SpO2 95%    BMI 45.80 kg/m     Wt Readings from Last 3 Encounters:  06/21/21 271 lb (122.9 kg)  06/12/21 274 lb (124.3 kg)  04/09/21 274 lb 9.6 oz (124.6 kg)     GEN:  Well nourished, well developed in no acute distress HEENT: Normal NECK: No JVD; No carotid bruits LYMPHATICS: No lymphadenopathy CARDIAC: RRR, no murmurs, no rubs, no gallops RESPIRATORY:  Clear to auscultation without rales, wheezing or rhonchi  ABDOMEN: Soft, non-tender, non-distended MUSCULOSKELETAL:  No edema; No deformity  SKIN: Warm and dry LOWER EXTREMITIES: no swelling NEUROLOGIC:  Alert and oriented x 3 PSYCHIATRIC:  Normal affect   ASSESSMENT:    1. Essential hypertension   2. OSA (obstructive sleep apnea)   3. Obesity, unspecified classification, unspecified obesity type, unspecified whether serious comorbidity present   4. Swelling of extremity    PLAN:    In order of problems listed  above:  Essential hypertension blood pressure well controlled today we will continue present management. Obstructive sleep apnea she does use dental device she used to use a CPAP mask however does not do it anymore I told her that swelling of lower extremities could be related to obstructive sleep apnea and I encouraged her to maybe consider upgrading her treatment to CPAP mask. Obesity's which is clearly contributing.  I have suggested to work on weight loss also encouraged her to exercise on the regular basis. Swelling of lower extremities multifactorial.  There is minimal pitting edema last time I gave her Lasix with not much improvement, proBNP has been checked which was perfectly normal.  She stopped Lasix since it did not help any I encouraged her to keep her legs elevated I encouraged her to walk on the regular basis avoiding salty food and also elastic stockings should be considered. Left ventricle ejection fraction being low limits of normal no need to initiate any therapy at this stage however I will repeat her echocardiogram within the next 6 months and then I will see her shortly after that obviously there be deterioration of left ventricle ejection fraction that need to be managed aggressively.   Medication Adjustments/Labs and Tests Ordered: Current medicines are reviewed at length with the patient today.  Concerns regarding medicines are outlined above.  No orders of the defined types were placed in this encounter.  Medication changes: No orders of the defined types were placed in this encounter.   Signed, Park Liter, MD, [facility] 06/21/2021 8:50 AM    [place]

## 2021-06-21 NOTE — Patient Instructions (Addendum)
Medication Instructions:  Your physician recommends that you continue on your current medications as directed. Please refer to the Current Medication list given to you today.  *If you need a refill on your cardiac medications before your next appointment, please call your pharmacy*   Lab Work: None If you have labs (blood work) drawn today and your tests are completely normal, you will receive your results only by: MyChart Message (if you have MyChart) OR A paper copy in the mail If you have any lab test that is abnormal or we need to change your treatment, we will call you to review the results.   Testing/Procedures: Your physician has requested that you have an echocardiogram prior to your 6 month follow up with [name]. Echocardiography is a painless test that uses sound waves to create images of your heart. It provides your doctor with information about the size and shape of your heart and how well your hearts chambers and valves are working. This procedure takes approximately one hour. There are no restrictions for this procedure.    Follow-Up: At [facility], you and your health needs are our priority.  As part of our continuing mission to provide you with exceptional heart care, we have created designated Provider Care Teams.  These Care Teams include your primary Cardiologist (physician) and Advanced Practice Providers (APPs -  Physician Assistants and Nurse Practitioners) who all work together to provide you with the care you need, when you need it.   Your next appointment:   6 month(s) with Echo 2 weeks prior  The format for your next appointment:   In Person  Provider:   Gypsy Balsam, MD{

## 2021-06-25 ENCOUNTER — Other Ambulatory Visit: Payer: Self-pay | Admitting: Internal Medicine

## 2021-07-04 DIAGNOSIS — J3081 Allergic rhinitis due to animal (cat) (dog) hair and dander: Secondary | ICD-10-CM | POA: Diagnosis not present

## 2021-07-04 DIAGNOSIS — J301 Allergic rhinitis due to pollen: Secondary | ICD-10-CM | POA: Diagnosis not present

## 2021-07-04 DIAGNOSIS — J3089 Other allergic rhinitis: Secondary | ICD-10-CM | POA: Diagnosis not present

## 2021-07-09 ENCOUNTER — Encounter: Payer: Self-pay | Admitting: Internal Medicine

## 2021-07-09 ENCOUNTER — Ambulatory Visit: Payer: Medicare PPO | Admitting: Internal Medicine

## 2021-07-09 ENCOUNTER — Other Ambulatory Visit: Payer: Self-pay

## 2021-07-09 DIAGNOSIS — G4733 Obstructive sleep apnea (adult) (pediatric): Secondary | ICD-10-CM | POA: Diagnosis not present

## 2021-07-09 DIAGNOSIS — E669 Obesity, unspecified: Secondary | ICD-10-CM | POA: Diagnosis not present

## 2021-07-09 NOTE — Assessment & Plan Note (Signed)
Mild OSA despite her weight. Plan-encouraged to use her oral appliance routinely and to follow-up with Dr. Toni Arthurs as planned.

## 2021-07-09 NOTE — Patient Instructions (Signed)
Work with Dr Toni Arthurs for the oral appliance  We will be happy to see you again as needed for sleep apnea or your breathing

## 2021-07-09 NOTE — Assessment & Plan Note (Signed)
Significantly overweight and she understands this impacts her exercise tolerance and aggravates her sleep apnea. Plan-lifestyle change with diet and exercise encouraged.

## 2021-07-09 NOTE — Progress Notes (Signed)
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  We discussed treatment alternatives and purpose. She would like to explore a fitted oral appliance and asks for referral. We will see who can take m'caid. Again emphasized her responsibility for safe personal driving and importance of weight loss.  No recent asthma exacerbation. Occ use of rescue inhaler.   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 Due there for re-visit soon.  She does not recognize it makes a lot of difference in her sleep quality and admits she does not use it every night.  The device is comfortable.  Never had CPAP. Had COVID infection August 2022 treated with molnupiravir. Dr. Madie Reno is her allergist managing asthma.  She thinks she coughs worse on allergy shots.  ROS-see HPI   + = positive Constitutional:    weight loss, night sweats, fevers, chills, fatigue, lassitude. HEENT:    headaches, difficulty swallowing, tooth/dental  problems, sore throat,       sneezing, itching, ear ache, +nasal congestion, post nasal drip, snoring CV:    chest pain, orthopnea, PND, +swelling in lower extremities, anasarca,                                   dizziness, palpitations Resp:   shortness of breath with exertion or at rest.                +productive cough,   +non-productive cough, coughing up of blood.              change in color of mucus.  wheezing.   Skin:    rash or lesions. GI:  +heartburn, indigestion, abdominal pain, nausea, vomiting, diarrhea,                 change in bowel habits, loss of appetite GU: dysuria, change in color of urine, no urgency or frequency.   flank pain. MS:   joint pain, stiffness, decreased range of motion, back pain. Neuro-     nothing unusual Psych:  change in mood or affect.  depression or anxiety.   memory loss.  OBJ- Physical Exam General- Alert, Oriented, Affect-appropriate, Distress- none acute, + obese Skin- rash-none, lesions- none, excoriation- none Lymphadenopathy- none Head- atraumatic            Eyes- Gross vision intact, PERRLA, conjunctivae and secretions clear            Ears- Hearing, canals-normal            Nose- Clear, no-Septal dev, mucus, polyps, erosion, perforation  Throat- Mallampati IV , mucosa clear , drainage- none, tonsils- atrophic, + teeth Neck- flexible , trachea midline, no stridor , thyroid nl, carotid no bruit Chest - symmetrical excursion , unlabored           Heart/CV- RRR , no murmur , no gallop  , no rub, nl s1 s2                           - JVD- none , edema- none, stasis changes- none, varices- none           Lung- clear to P&A, wheeze-none, cough- none , dullness-none, rub- none           Chest wall-  Abd-  Br/ Gen/ Rectal- Not done, not indicated Extrem- cyanosis- none, clubbing, none, atrophy- none, strength- nl Neuro- grossly intact to observation

## 2021-07-10 ENCOUNTER — Telehealth: Payer: Self-pay

## 2021-07-10 ENCOUNTER — Encounter: Payer: Self-pay | Admitting: Family Medicine

## 2021-07-10 ENCOUNTER — Ambulatory Visit (INDEPENDENT_AMBULATORY_CARE_PROVIDER_SITE_OTHER): Payer: Medicare PPO | Admitting: Family Medicine

## 2021-07-10 ENCOUNTER — Other Ambulatory Visit: Payer: Self-pay | Admitting: Family Medicine

## 2021-07-10 VITALS — BP 120/74 | HR 61 | Temp 98.2°F | Resp 16 | Wt 273.2 lb

## 2021-07-10 DIAGNOSIS — E559 Vitamin D deficiency, unspecified: Secondary | ICD-10-CM | POA: Diagnosis not present

## 2021-07-10 DIAGNOSIS — E782 Mixed hyperlipidemia: Secondary | ICD-10-CM

## 2021-07-10 DIAGNOSIS — E669 Obesity, unspecified: Secondary | ICD-10-CM | POA: Diagnosis not present

## 2021-07-10 DIAGNOSIS — D649 Anemia, unspecified: Secondary | ICD-10-CM

## 2021-07-10 DIAGNOSIS — J3089 Other allergic rhinitis: Secondary | ICD-10-CM | POA: Diagnosis not present

## 2021-07-10 DIAGNOSIS — Z0000 Encounter for general adult medical examination without abnormal findings: Secondary | ICD-10-CM | POA: Diagnosis not present

## 2021-07-10 DIAGNOSIS — I10 Essential (primary) hypertension: Secondary | ICD-10-CM | POA: Diagnosis not present

## 2021-07-10 DIAGNOSIS — Z780 Asymptomatic menopausal state: Secondary | ICD-10-CM

## 2021-07-10 DIAGNOSIS — R739 Hyperglycemia, unspecified: Secondary | ICD-10-CM

## 2021-07-10 DIAGNOSIS — G4733 Obstructive sleep apnea (adult) (pediatric): Secondary | ICD-10-CM | POA: Diagnosis not present

## 2021-07-10 DIAGNOSIS — E2839 Other primary ovarian failure: Secondary | ICD-10-CM

## 2021-07-10 LAB — CBC WITH DIFFERENTIAL/PLATELET
Basophils Absolute: 0.1 10*3/uL (ref 0.0–0.1)
Basophils Relative: 1 % (ref 0.0–3.0)
Eosinophils Absolute: 0.8 10*3/uL — ABNORMAL HIGH (ref 0.0–0.7)
Eosinophils Relative: 9 % — ABNORMAL HIGH (ref 0.0–5.0)
HCT: 34.1 % — ABNORMAL LOW (ref 36.0–46.0)
Hemoglobin: 10.9 g/dL — ABNORMAL LOW (ref 12.0–15.0)
Lymphocytes Relative: 24.2 % (ref 12.0–46.0)
Lymphs Abs: 2.1 10*3/uL (ref 0.7–4.0)
MCHC: 32 g/dL (ref 30.0–36.0)
MCV: 85.9 fl (ref 78.0–100.0)
Monocytes Absolute: 0.8 10*3/uL (ref 0.1–1.0)
Monocytes Relative: 9.7 % (ref 3.0–12.0)
Neutro Abs: 4.9 10*3/uL (ref 1.4–7.7)
Neutrophils Relative %: 56.1 % (ref 43.0–77.0)
Platelets: 388 10*3/uL (ref 150.0–400.0)
RBC: 3.97 Mil/uL (ref 3.87–5.11)
RDW: 16.9 % — ABNORMAL HIGH (ref 11.5–15.5)
WBC: 8.7 10*3/uL (ref 4.0–10.5)

## 2021-07-10 LAB — TSH: TSH: 2.69 u[IU]/mL (ref 0.35–5.50)

## 2021-07-10 LAB — COMPREHENSIVE METABOLIC PANEL
ALT: 12 U/L (ref 0–35)
AST: 13 U/L (ref 0–37)
Albumin: 3.9 g/dL (ref 3.5–5.2)
Alkaline Phosphatase: 75 U/L (ref 39–117)
BUN: 10 mg/dL (ref 6–23)
CO2: 33 mEq/L — ABNORMAL HIGH (ref 19–32)
Calcium: 9.4 mg/dL (ref 8.4–10.5)
Chloride: 106 mEq/L (ref 96–112)
Creatinine, Ser: 1.01 mg/dL (ref 0.40–1.20)
GFR: 57.11 mL/min — ABNORMAL LOW (ref >60.00)
Glucose, Bld: 101 mg/dL — ABNORMAL HIGH (ref 70–99)
Potassium: 4.4 mEq/L (ref 3.5–5.1)
Sodium: 142 mEq/L (ref 135–145)
Total Bilirubin: 0.6 mg/dL (ref 0.2–1.2)
Total Protein: 6.8 g/dL (ref 6.0–8.3)

## 2021-07-10 LAB — LIPID PANEL
Cholesterol: 143 mg/dL (ref 0–200)
HDL: 45.8 mg/dL (ref >39.00)
LDL Cholesterol: 82 mg/dL (ref 0–99)
NonHDL: 97.43
Total CHOL/HDL Ratio: 3
Triglycerides: 76 mg/dL (ref 0.0–149.0)
VLDL: 15.2 mg/dL (ref 0.0–40.0)

## 2021-07-10 LAB — HEMOGLOBIN A1C: Hgb A1c MFr Bld: 5.7 % (ref 4.6–6.5)

## 2021-07-10 MED ORDER — WEGOVY 0.25 MG/0.5ML ~~LOC~~ SOAJ: 0.2500 mg | SUBCUTANEOUS | 0 refills | Status: DC

## 2021-07-10 MED ORDER — ROGAINE WOMENS 5 % EX FOAM: 1.0000 | Freq: Every day | CUTANEOUS | 3 refills | Status: DC

## 2021-07-10 NOTE — Assessment & Plan Note (Signed)
Encourage heart healthy diet such as MIND or DASH diet, increase exercise, avoid trans fats, simple carbohydrates and processed foods, consider a krill or fish or flaxseed oil cap daily.  °

## 2021-07-10 NOTE — Assessment & Plan Note (Signed)
Supplement and monitor 

## 2021-07-10 NOTE — Assessment & Plan Note (Signed)
Patient encouraged to maintain heart healthy diet, regular exercise, adequate sleep. Consider daily probiotics. Take medications as prescribed. Labs ordered and reviewed. Colonoscopy normal in 2020 has aged out of screening.

## 2021-07-10 NOTE — Assessment & Plan Note (Addendum)
Has given up allergy shots for now as they did not work enough

## 2021-07-10 NOTE — Assessment & Plan Note (Addendum)
Not using CPAP,  Has a dental appliance uses it sporadically

## 2021-07-10 NOTE — Telephone Encounter (Signed)
PA initiated via Covermymeds; KEY: [id number].   PA cancelled by plan.   Message from Plan Available without authorization.

## 2021-07-10 NOTE — Assessment & Plan Note (Signed)
Encouraged DASH or MIND diet, decrease po intake and increase exercise as tolerated. Needs 7-8 hours of sleep nightly. Avoid trans fats, eat small, frequent meals every 4-5 hours with lean proteins, complex carbs and healthy fats. Minimize simple carbs, high fat foods and processed foods 

## 2021-07-10 NOTE — Patient Instructions (Addendum)
Covermymeds or goodrx are online platforms for [employer] (w) or Kirke Shaggy are the shots for weight loss check with your insurance or pharmacy regarding coverage. Let us know if they want   Fish or krill or flaxseed oil caps for hair growth  Prevnar 20 one time pneumonia shot Tdap (tetanus) if injured call or seek care for shot   Bone density shows osteopenia, which is thinner than normal but not as bad as osteoporosis. Recommend calcium intake of 1200 to 1500 mg daily, divided into roughly 3 doses. Best source is the diet and a single dairy serving is about 500 mg, a supplement of calcium citrate once or twice daily to balance diet is fine if not getting enough in diet. Also need Vitamin D 2000 IU caps, 1 cap daily if not already taking vitamin D. Also recommend weight baring exercise on hips and upper body to keep bones strong   Shingrix is the new shingles shot, 2 shots over 2-6 months, confirm coverage with insurance and document, then can return here for shots with nurse appt or at [place] 65 Years and Older, Female Preventive care refers to lifestyle choices and visits with your health care provider that can promote health and wellness. Preventive care visits are also called wellness exams. What can I expect for my preventive care visit? Counseling Your health care provider may ask you questions about your: Medical history, including: Past medical problems. Family medical history. Pregnancy and menstrual history. History of falls. Current health, including: Memory and ability to understand (cognition). Emotional well-being. Home life and relationship well-being. Sexual activity and sexual health. Lifestyle, including: Alcohol, nicotine or tobacco, and drug use. Access to firearms. Diet, exercise, and sleep habits. Work and work Statistician. Sunscreen use. Safety issues such as seatbelt and bike helmet use. Physical exam Your health care provider  will check your: Height and weight. These may be used to calculate your BMI (body mass index). BMI is a measurement that tells if you are at a healthy weight. Waist circumference. This measures the distance around your waistline. This measurement also tells if you are at a healthy weight and may help predict your risk of certain diseases, such as type 2 diabetes and high blood pressure. Heart rate and blood pressure. Body temperature. Skin for abnormal spots. What immunizations do I need? Vaccines are usually given at various ages, according to a schedule. Your health care provider will recommend vaccines for you based on your age, medical history, and lifestyle or other factors, such as travel or where you work. What tests do I need? Screening Your health care provider may recommend screening tests for certain conditions. This may include: Lipid and cholesterol levels. Hepatitis C test. Hepatitis B test. HIV (human immunodeficiency virus) test. STI (sexually transmitted infection) testing, if you are at risk. Lung cancer screening. Colorectal cancer screening. Diabetes screening. This is done by checking your blood sugar (glucose) after you have not eaten for a while (fasting). Mammogram. Talk with your health care provider about how often you should have regular mammograms. BRCA-related cancer screening. This may be done if you have a family history of breast, ovarian, tubal, or peritoneal cancers. Bone density scan. This is done to screen for osteoporosis. Talk with your health care provider about your test results, treatment options, and if necessary, the need for more tests. Follow these instructions at home: Eating and drinking  Eat a diet that includes fresh fruits and vegetables, whole grains, lean protein,  and low-fat dairy products. Limit your intake of foods with high amounts of sugar, saturated fats, and salt. Take vitamin and mineral supplements as recommended by your health  care provider. Do not drink alcohol if your health care provider tells you not to drink. If you drink alcohol: Limit how much you have to 0-1 drink a day. Know how much alcohol is in your drink. In the [place] one drink equals one 12 oz bottle of beer (355 mL), one 5 oz glass of wine (148 mL), or one 1 oz glass of hard liquor (44 mL). Lifestyle Brush your teeth every morning and night with fluoride toothpaste. Floss one time each day. Exercise for at least 30 minutes 5 or more days each week. Do not use any products that contain nicotine or tobacco. These products include cigarettes, chewing tobacco, and vaping devices, such as e-cigarettes. If you need help quitting, ask your health care provider. Do not use drugs. If you are sexually active, practice safe sex. Use a condom or other form of protection in order to prevent STIs. Take aspirin only as told by your health care provider. Make sure that you understand how much to take and what form to take. Work with your health care provider to find out whether it is safe and beneficial for you to take aspirin daily. Ask your health care provider if you need to take a cholesterol-lowering medicine (statin). Find healthy ways to manage stress, such as: Meditation, yoga, or listening to music. Journaling. Talking to a trusted person. Spending time with friends and family. Minimize exposure to UV radiation to reduce your risk of skin cancer. Safety Always wear your seat belt while driving or riding in a vehicle. Do not drive: If you have been drinking alcohol. Do not ride with someone who has been drinking. When you are tired or distracted. While texting. If you have been using any mind-altering substances or drugs. Wear a helmet and other protective equipment during sports activities. If you have firearms in your house, make sure you follow all gun safety procedures. What's next? Visit your health care provider once a year for an annual wellness  visit. Ask your health care provider how often you should have your eyes and teeth checked. Stay up to date on all vaccines. This information is not intended to replace advice given to you by your health care provider. Make sure you discuss any questions you have with your health care provider. Document Revised: 11/15/2020 Document Reviewed: 11/15/2020 Elsevier Patient Education  [place].

## 2021-07-10 NOTE — Assessment & Plan Note (Signed)
Well controlled, no changes to meds. Encouraged heart healthy diet such as the DASH diet and exercise as tolerated.  °

## 2021-07-10 NOTE — Assessment & Plan Note (Signed)
hgba1c acceptable, minimize simple carbs. Increase exercise as tolerated.  

## 2021-07-11 DIAGNOSIS — J3089 Other allergic rhinitis: Secondary | ICD-10-CM | POA: Diagnosis not present

## 2021-07-11 DIAGNOSIS — J3081 Allergic rhinitis due to animal (cat) (dog) hair and dander: Secondary | ICD-10-CM | POA: Diagnosis not present

## 2021-07-11 DIAGNOSIS — J301 Allergic rhinitis due to pollen: Secondary | ICD-10-CM | POA: Diagnosis not present

## 2021-07-11 MED ORDER — FERROUS FUMARATE 325 (106 FE) MG PO TABS: 1.0000 | ORAL_TABLET | Freq: Every day | ORAL | Status: AC

## 2021-07-11 MED ORDER — HEMOCYTE-F 324-1 MG PO TABS: 1.0000 | ORAL_TABLET | Freq: Every day | ORAL | 3 refills | Status: AC

## 2021-07-13 LAB — VITAMIN D 1,25 DIHYDROXY
Vitamin D 1, 25 (OH)2 Total: 51 pg/mL (ref 18–72)
Vitamin D2 1, 25 (OH)2: 36 pg/mL
Vitamin D3 1, 25 (OH)2: 15 pg/mL

## 2021-07-16 DIAGNOSIS — D649 Anemia, unspecified: Secondary | ICD-10-CM | POA: Insufficient documentation

## 2021-07-16 NOTE — Progress Notes (Signed)
Subjective:    [name], female    DOB: [birth year], 71 y.o.   MRN: [id number]  Chief Complaint  Patient presents with   Annual Exam    HPI Patient is in today for annual preventative exam and follow up on chronic medical concerns. No recent febrile illness or hospitalizations. She is staying active but acknowledges she is not exercising regularly. Eats better some days than others. She follows with [facility], Dr Stann Mainland who also orders her OB/GYN. Denies CP/palp/SOB/HA/congestion/fevers/GI or GU c/o. Taking meds as prescribed   Past Medical History:  Diagnosis Date   Allergic rhinitis    Apnea, sleep 06/28/2012   Previously used CPAP   Asthma    Atypical chest pain 01/14/2017   Back pain    Decreased visual acuity 08/05/2016   Depression 06/19/2015   Environmental and seasonal allergies    GERD (gastroesophageal reflux disease)    Heartburn    Hip pain, right 06/28/2012   HTN (hypertension)    Hypokalemia 01/14/2017   Leg pain    Morbid obesity ([place]) 04/11/2008   Qualifier: Diagnosis of  By: Wynona Luna    Numbness in feet 05/06/2016   Obesity    Osteoarthritis 01/16/2010   Qualifier: Diagnosis of  By: Wynona Luna    Preventative health care 06/25/2015   Shortness of breath    Shortness of breath on exertion    Sleep apnea    Swelling of extremity    Tinnitus    Vertigo     Past Surgical History:  Procedure Laterality Date   BIOPSY BREAST  1997   left    childbirth     COLONOSCOPY  2004   UPPER GASTROINTESTINAL ENDOSCOPY  03/01/2019    Family History  Problem Relation Age of Onset   Stroke Mother    Liver disease Mother        hepatitis b   Hypertension Mother    Cancer Father        esophagus, prostate   Hypertension Father    Heart disease Father    Esophageal cancer Father    Liver disease Brother        hep c   Mental illness Brother        PTSD from [place]   Stroke Maternal Grandmother    Cancer Paternal  Grandfather        prostate   Colitis Neg Hx    Colon cancer Neg Hx    Rectal cancer Neg Hx    Stomach cancer Neg Hx     Social History   Socioeconomic History   Marital status: Divorced    Spouse name: Not on file   Number of children: 1   Years of education: Not on file   Highest education level: Not on file  Occupational History   Occupation: HS music teacher- Equities trader  Tobacco Use   Smoking status: Never   Smokeless tobacco: Never  Vaping Use   Vaping Use: Never used  Substance and Sexual Activity   Alcohol use: Yes    Comment: social   Drug use: No   Sexual activity: Not on file    Comment: band teacher, 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 no dietary restrictions  Other Topics Concern   Not on file  Social History Narrative   Single/divorced   1 son born 63 - sometimes there w/ her, leukemia 2016   Music teacher [employer]   Caffeine use  1 cup AM   Never smoker/tobacco   No drugs   Rare-occ EtOHSmoking    Social Determinants of Health   Financial Resource Strain: Low Risk    Difficulty of Paying Living Expenses: Not hard at all  Food Insecurity: No Food Insecurity   Worried About Charity fundraiser in the Last Year: Never true   Ran Out of Food in the Last Year: Never true  Transportation Needs: No Transportation Needs   Lack of Transportation (Medical): No   Lack of Transportation (Non-Medical): No  Physical Activity: Inactive   Days of Exercise per Week: 0 days   Minutes of Exercise per Session: 0 min  Stress: No Stress Concern Present   Feeling of Stress : Not at all  Social Connections: Moderately Integrated   Frequency of Communication with Friends and Family: More than three times a week   Frequency of Social Gatherings with Friends and Family: More than three times a week   Attends Religious Services: More than 4 times per year   Active Member of [employer] or Organizations: Yes   Attends Music therapist: More than 4 times per year    Marital Status: Divorced  Human resources officer Violence: Not At Risk   Fear of Current or Ex-Partner: No   Emotionally Abused: No   Physically Abused: No   Sexually Abused: No    Outpatient Medications Prior to Visit  Medication Sig Dispense Refill   amLODipine-olmesartan (AZOR) 10-40 MG tablet TAKE 1 TABLET BY MOUTH EVERY DAY 90 tablet 1   DEXILANT 60 MG capsule TAKE 1 CAPSULE (60 MG TOTAL) BY MOUTH DAILY BEFORE BREAKFAST. 90 capsule 0   EPINEPHrine 0.3 mg/0.3 mL IJ SOAJ injection Inject 1 Dose into the muscle as needed for allergies. Inject after allergy shot for anaphylaxis     famotidine (PEPCID) 40 MG tablet Take 40 mg by mouth daily.     levocetirizine (XYZAL) 5 MG tablet Take 5 mg by mouth every evening.     montelukast (SINGULAIR) 10 MG tablet Take 10 mg by mouth at bedtime.     oxybutynin (DITROPAN-XL) 10 MG 24 hr tablet Take 10 mg by mouth daily.      albuterol (VENTOLIN HFA) 108 (90 Base) MCG/ACT inhaler Inhale 1-2 puffs into the lungs every 6 (six) hours as needed for wheezing or shortness of breath. 18 g 5   amLODipine-olmesartan (AZOR) 10-40 MG tablet Take 1 tablet by mouth daily.     fluticasone (FLONASE) 50 MCG/ACT nasal spray Place 1 spray into both nostrils daily as needed for allergies or rhinitis.     fluticasone (FLOVENT HFA) 220 MCG/ACT inhaler Inhale 1 puff into the lungs in the morning and at bedtime. 1 Inhaler 12   furosemide (LASIX) 20 MG tablet Take 1 tablet (20 mg total) by mouth daily. 90 tablet 1   No facility-administered medications prior to visit.    Allergies  Allergen Reactions   Sulfamethoxazole-Trimethoprim     Other reaction(s): Unknown   Sulfonamide Derivatives    Sulfa Antibiotics Rash    Review of Systems  Constitutional:  Negative for chills, fever and malaise/fatigue.  HENT:  Negative for congestion and hearing loss.   Eyes:  Negative for discharge.  Respiratory:  Negative for cough, sputum production and shortness of breath.    Cardiovascular:  Negative for chest pain, palpitations and leg swelling.  Gastrointestinal:  Negative for abdominal pain, blood in stool, constipation, diarrhea, heartburn, nausea and vomiting.  Genitourinary:  Negative for  dysuria, frequency, hematuria and urgency.  Musculoskeletal:  Negative for back pain, falls and myalgias.  Skin:  Negative for rash.  Neurological:  Negative for dizziness, sensory change, loss of consciousness, weakness and headaches.  Endo/Heme/Allergies:  Negative for environmental allergies. Does not bruise/bleed easily.  Psychiatric/Behavioral:  Negative for depression and suicidal ideas. The patient is not nervous/anxious and does not have insomnia.       Objective:    Physical Exam Constitutional:      General: She is not in acute distress.    Appearance: She is well-developed.  HENT:     Head: Normocephalic and atraumatic.  Eyes:     Conjunctiva/sclera: Conjunctivae normal.  Neck:     Thyroid: No thyromegaly.  Cardiovascular:     Rate and Rhythm: Normal rate and regular rhythm.     Heart sounds: Normal heart sounds. No murmur heard. Pulmonary:     Effort: Pulmonary effort is normal. No respiratory distress.     Breath sounds: Normal breath sounds.  Abdominal:     General: Bowel sounds are normal. There is no distension.     Palpations: Abdomen is soft. There is no mass.     Tenderness: There is no abdominal tenderness.  Musculoskeletal:     Cervical back: Neck supple.  Lymphadenopathy:     Cervical: No cervical adenopathy.  Skin:    General: Skin is warm and dry.  Neurological:     Mental Status: She is alert and oriented to person, place, and time.  Psychiatric:        Behavior: Behavior normal.    BP 120/74    Pulse 61    Temp 98.2 F (36.8 C)    Resp 16    Wt 273 lb 3.2 oz (123.9 kg)    SpO2 98%    BMI 46.17 kg/m  Wt Readings from Last 3 Encounters:  07/10/21 273 lb 3.2 oz (123.9 kg)  07/09/21 274 lb 3.2 oz (124.4 kg)  06/21/21 271 lb  (122.9 kg)    Diabetic Foot Exam - Simple   No data filed    Lab Results  Component Value Date   WBC 8.7 07/10/2021   HGB 10.9 (L) 07/10/2021   HCT 34.1 (L) 07/10/2021   PLT 388.0 07/10/2021   GLUCOSE 101 (H) 07/10/2021   CHOL 143 07/10/2021   TRIG 76.0 07/10/2021   HDL 45.80 07/10/2021   LDLCALC 82 07/10/2021   ALT 12 07/10/2021   AST 13 07/10/2021   NA 142 07/10/2021   K 4.4 07/10/2021   CL 106 07/10/2021   CREATININE 1.01 07/10/2021   BUN 10 07/10/2021   CO2 33 (H) 07/10/2021   TSH 2.69 07/10/2021   HGBA1C 5.7 07/10/2021    Lab Results  Component Value Date   TSH 2.69 07/10/2021   Lab Results  Component Value Date   WBC 8.7 07/10/2021   HGB 10.9 (L) 07/10/2021   HCT 34.1 (L) 07/10/2021   MCV 85.9 07/10/2021   PLT 388.0 07/10/2021   Lab Results  Component Value Date   NA 142 07/10/2021   K 4.4 07/10/2021   CO2 33 (H) 07/10/2021   GLUCOSE 101 (H) 07/10/2021   BUN 10 07/10/2021   CREATININE 1.01 07/10/2021   BILITOT 0.6 07/10/2021   ALKPHOS 75 07/10/2021   AST 13 07/10/2021   ALT 12 07/10/2021   PROT 6.8 07/10/2021   ALBUMIN 3.9 07/10/2021   CALCIUM 9.4 07/10/2021   ANIONGAP 7 03/19/2019   EGFR 59 (L)  03/07/2021   GFR 57.11 (L) 07/10/2021   Lab Results  Component Value Date   CHOL 143 07/10/2021   Lab Results  Component Value Date   HDL 45.80 07/10/2021   Lab Results  Component Value Date   LDLCALC 82 07/10/2021   Lab Results  Component Value Date   TRIG 76.0 07/10/2021   Lab Results  Component Value Date   CHOLHDL 3 07/10/2021   Lab Results  Component Value Date   HGBA1C 5.7 07/10/2021       Assessment & Plan:   Problem List Items Addressed This Visit     Hyperlipidemia, mixed    Encourage heart healthy diet such as MIND or DASH diet, increase exercise, avoid trans fats, simple carbohydrates and processed foods, consider a krill or fish or flaxseed oil cap daily.       Relevant Orders   CBC with Differential/Platelet  (Completed)   Comprehensive metabolic panel (Completed)   Lipid panel (Completed)   TSH (Completed)   Obesity    Encouraged DASH or MIND diet, decrease po intake and increase exercise as tolerated. Needs 7-8 hours of sleep nightly. Avoid trans fats, eat small, frequent meals every 4-5 hours with lean proteins, complex carbs and healthy fats. Minimize simple carbs, high fat foods and processed foods      Relevant Medications   Semaglutide-Weight Management (WEGOVY) 0.25 MG/0.5ML SOAJ   Essential hypertension    Well controlled, no changes to meds. Encouraged heart healthy diet such as the DASH diet and exercise as tolerated.       Relevant Orders   CBC with Differential/Platelet (Completed)   Comprehensive metabolic panel (Completed)   Lipid panel (Completed)   TSH (Completed)   OSA (obstructive sleep apnea)    Not using CPAP,  Has a dental appliance uses it sporadically      Vitamin D deficiency    Supplement and monitor      Relevant Medications   ferrous fumarate (HEMOCYTE - 106 mg FE) tablet 106 mg of iron   Other Relevant Orders   Vitamin D 1,25 dihydroxy (Completed)   Hyperglycemia    hgba1c acceptable, minimize simple carbs. Increase exercise as tolerated.       Relevant Orders   Hemoglobin A1c (Completed)   Preventative health care - Primary    Patient encouraged to maintain heart healthy diet, regular exercise, adequate sleep. Consider daily probiotics. Take medications as prescribed. Labs ordered and reviewed. Colonoscopy normal in 2020 has aged out of screening.       Environmental and seasonal allergies    Has given up allergy shots for now as they did not work enough      Anemia    Increase leafy greens, consider increased lean red meat and using cast iron cookware. Continue to monitor, report any concerns      Relevant Medications   ferrous fumarate (HEMOCYTE - 106 mg FE) tablet 106 mg of iron   Ferrous Fumarate-Folic Acid (HEMOCYTE-F) 324-1 MG TABS    Other Relevant Orders   Fecal occult blood, imunochemical([facility])   Other Visit Diagnoses     Estrogen deficiency       Relevant Orders   DG Bone Density   Post-menopausal       Relevant Medications   ferrous fumarate (HEMOCYTE - 106 mg FE) tablet 106 mg of iron   Other Relevant Orders   DG Bone Density       I have discontinued Olin Hauser E. Combes's fluticasone, Flovent HFA, albuterol,  and furosemide. I am also having her start on [place], [place], and Hemocyte-F. Additionally, I am having her maintain her oxybutynin, levocetirizine, EPINEPHrine, amLODipine-olmesartan, famotidine, montelukast, and Dexilant. We will continue to administer ferrous fumarate.  Meds ordered this encounter  Medications   Minoxidil (ROGAINE WOMENS) 5 % FOAM    Sig: Apply 1 Dose topically daily.    Dispense:  60 g    Refill:  3   Semaglutide-Weight Management (WEGOVY) 0.25 MG/0.5ML SOAJ    Sig: Inject 0.25 mg into the skin once a week.    Dispense:  2 mL    Refill:  0   ferrous fumarate (HEMOCYTE - 106 mg FE) tablet 106 mg of iron    30 days , 3 refills   Ferrous Fumarate-Folic Acid (HEMOCYTE-F) 324-1 MG TABS    Sig: Take 1 tablet by mouth daily.    Dispense:  30 tablet    Refill:  3     Penni Homans, MD

## 2021-07-16 NOTE — Assessment & Plan Note (Signed)
Increase leafy greens, consider increased lean red meat and using cast iron cookware. Continue to monitor, report any concerns 

## 2021-07-17 ENCOUNTER — Other Ambulatory Visit: Payer: Self-pay | Admitting: *Deleted

## 2021-07-17 ENCOUNTER — Other Ambulatory Visit: Payer: Medicare PPO

## 2021-07-17 DIAGNOSIS — D649 Anemia, unspecified: Secondary | ICD-10-CM

## 2021-07-17 NOTE — Addendum Note (Signed)
Addended by: Mervin Kung A on: 07/17/2021 08:22 AM   Modules accepted: Orders

## 2021-07-18 DIAGNOSIS — Z6841 Body Mass Index (BMI) 40.0 and over, adult: Secondary | ICD-10-CM | POA: Diagnosis not present

## 2021-07-18 DIAGNOSIS — Z1231 Encounter for screening mammogram for malignant neoplasm of breast: Secondary | ICD-10-CM | POA: Diagnosis not present

## 2021-07-18 DIAGNOSIS — J3089 Other allergic rhinitis: Secondary | ICD-10-CM | POA: Diagnosis not present

## 2021-07-18 DIAGNOSIS — J3081 Allergic rhinitis due to animal (cat) (dog) hair and dander: Secondary | ICD-10-CM | POA: Diagnosis not present

## 2021-07-18 DIAGNOSIS — Z01419 Encounter for gynecological examination (general) (routine) without abnormal findings: Secondary | ICD-10-CM | POA: Diagnosis not present

## 2021-07-18 DIAGNOSIS — J301 Allergic rhinitis due to pollen: Secondary | ICD-10-CM | POA: Diagnosis not present

## 2021-07-19 ENCOUNTER — Other Ambulatory Visit: Payer: Self-pay

## 2021-07-19 ENCOUNTER — Ambulatory Visit (HOSPITAL_BASED_OUTPATIENT_CLINIC_OR_DEPARTMENT_OTHER)
Admission: RE | Admit: 2021-07-19 | Discharge: 2021-07-19 | Disposition: A | Discharge status: Home / Self Care | Payer: Medicare PPO | Source: Ambulatory Visit | Attending: Family Medicine | Admitting: Family Medicine

## 2021-07-19 ENCOUNTER — Other Ambulatory Visit: Payer: Medicare PPO

## 2021-07-19 ENCOUNTER — Encounter: Payer: Self-pay | Admitting: *Deleted

## 2021-07-19 DIAGNOSIS — E2839 Other primary ovarian failure: Secondary | ICD-10-CM | POA: Insufficient documentation

## 2021-07-19 DIAGNOSIS — Z780 Asymptomatic menopausal state: Secondary | ICD-10-CM | POA: Insufficient documentation

## 2021-07-19 DIAGNOSIS — M8588 Other specified disorders of bone density and structure, other site: Secondary | ICD-10-CM | POA: Diagnosis not present

## 2021-07-25 DIAGNOSIS — J3081 Allergic rhinitis due to animal (cat) (dog) hair and dander: Secondary | ICD-10-CM | POA: Diagnosis not present

## 2021-07-25 DIAGNOSIS — J301 Allergic rhinitis due to pollen: Secondary | ICD-10-CM | POA: Diagnosis not present

## 2021-07-25 DIAGNOSIS — J3089 Other allergic rhinitis: Secondary | ICD-10-CM | POA: Diagnosis not present

## 2021-08-01 DIAGNOSIS — J3089 Other allergic rhinitis: Secondary | ICD-10-CM | POA: Diagnosis not present

## 2021-08-01 DIAGNOSIS — J301 Allergic rhinitis due to pollen: Secondary | ICD-10-CM | POA: Diagnosis not present

## 2021-08-01 DIAGNOSIS — J3081 Allergic rhinitis due to animal (cat) (dog) hair and dander: Secondary | ICD-10-CM | POA: Diagnosis not present

## 2021-08-08 ENCOUNTER — Other Ambulatory Visit: Payer: Medicare PPO

## 2021-08-09 ENCOUNTER — Other Ambulatory Visit: Payer: Medicare PPO

## 2021-08-15 DIAGNOSIS — J301 Allergic rhinitis due to pollen: Secondary | ICD-10-CM | POA: Diagnosis not present

## 2021-08-15 DIAGNOSIS — J3081 Allergic rhinitis due to animal (cat) (dog) hair and dander: Secondary | ICD-10-CM | POA: Diagnosis not present

## 2021-08-15 DIAGNOSIS — J3089 Other allergic rhinitis: Secondary | ICD-10-CM | POA: Diagnosis not present

## 2021-08-22 DIAGNOSIS — J301 Allergic rhinitis due to pollen: Secondary | ICD-10-CM | POA: Diagnosis not present

## 2021-08-22 DIAGNOSIS — J3081 Allergic rhinitis due to animal (cat) (dog) hair and dander: Secondary | ICD-10-CM | POA: Diagnosis not present

## 2021-08-22 DIAGNOSIS — J3089 Other allergic rhinitis: Secondary | ICD-10-CM | POA: Diagnosis not present

## 2021-08-29 DIAGNOSIS — J301 Allergic rhinitis due to pollen: Secondary | ICD-10-CM | POA: Diagnosis not present

## 2021-08-29 DIAGNOSIS — J3089 Other allergic rhinitis: Secondary | ICD-10-CM | POA: Diagnosis not present

## 2021-08-29 DIAGNOSIS — J3081 Allergic rhinitis due to animal (cat) (dog) hair and dander: Secondary | ICD-10-CM | POA: Diagnosis not present

## 2021-08-31 ENCOUNTER — Telehealth: Payer: Self-pay | Admitting: *Deleted

## 2021-08-31 NOTE — Telephone Encounter (Signed)
Pt never returned in March to repeat below labs.  Called pt to r/s appt and she states she was not able to pick up the Hemocyte Rx as her insurance did not cover it.  She wants to know if she can take and OTC iron supplement and if so, what dose/directions?  Also states that she still has the stool kit at home and just forgets to collect it.  She will work on completing that as soon as possible. Will schedule return lab appt once we give her the answer for the otc iron supplement. Please advise? ? ? ? ? [name], MD  ?07/10/2021  8:14 PM EST   ?  ?Labs stable no new concerns, no changes except anemia worse. Have her complete an iFob, start her on Hemocyte F 1 tab po daily disp #30 with 3 rf. Increase leafy greens, consider increased lean red meat and using cast iron cookware. Continue to monitor, report any concerns recheck cbc with iron studies in 1 month  ? ?

## 2021-09-04 NOTE — Telephone Encounter (Signed)
Attempted to notify pt and left message on voicemail to return my call. ? ? ? ?Terri Lukes, MD ?to Me  Ninfa Meeker, CMA   ? 2:46 PM ?Hemocyte is actually OTC and she just takes one a day and she mostly needs to get those labs repeated.  ?

## 2021-09-05 DIAGNOSIS — J3089 Other allergic rhinitis: Secondary | ICD-10-CM | POA: Diagnosis not present

## 2021-09-05 DIAGNOSIS — J301 Allergic rhinitis due to pollen: Secondary | ICD-10-CM | POA: Diagnosis not present

## 2021-09-05 DIAGNOSIS — J3081 Allergic rhinitis due to animal (cat) (dog) hair and dander: Secondary | ICD-10-CM | POA: Diagnosis not present

## 2021-09-11 ENCOUNTER — Other Ambulatory Visit (INDEPENDENT_AMBULATORY_CARE_PROVIDER_SITE_OTHER): Payer: Medicare PPO

## 2021-09-11 DIAGNOSIS — D649 Anemia, unspecified: Secondary | ICD-10-CM | POA: Diagnosis not present

## 2021-09-11 NOTE — Telephone Encounter (Signed)
Notified pt and she voices understanding. Lab appt scheduled for 10/15/21 at 3:15pm. ?

## 2021-09-12 LAB — FECAL OCCULT BLOOD, IMMUNOCHEMICAL: Fecal Occult Bld: NEGATIVE

## 2021-09-13 DIAGNOSIS — J301 Allergic rhinitis due to pollen: Secondary | ICD-10-CM | POA: Diagnosis not present

## 2021-09-13 DIAGNOSIS — J3081 Allergic rhinitis due to animal (cat) (dog) hair and dander: Secondary | ICD-10-CM | POA: Diagnosis not present

## 2021-09-13 DIAGNOSIS — J3089 Other allergic rhinitis: Secondary | ICD-10-CM | POA: Diagnosis not present

## 2021-09-19 ENCOUNTER — Other Ambulatory Visit: Payer: Self-pay | Admitting: Internal Medicine

## 2021-09-20 DIAGNOSIS — J3089 Other allergic rhinitis: Secondary | ICD-10-CM | POA: Diagnosis not present

## 2021-09-20 DIAGNOSIS — J3081 Allergic rhinitis due to animal (cat) (dog) hair and dander: Secondary | ICD-10-CM | POA: Diagnosis not present

## 2021-09-20 DIAGNOSIS — J301 Allergic rhinitis due to pollen: Secondary | ICD-10-CM | POA: Diagnosis not present

## 2021-09-26 DIAGNOSIS — J301 Allergic rhinitis due to pollen: Secondary | ICD-10-CM | POA: Diagnosis not present

## 2021-09-26 DIAGNOSIS — J3089 Other allergic rhinitis: Secondary | ICD-10-CM | POA: Diagnosis not present

## 2021-09-26 DIAGNOSIS — J3081 Allergic rhinitis due to animal (cat) (dog) hair and dander: Secondary | ICD-10-CM | POA: Diagnosis not present

## 2021-10-03 DIAGNOSIS — J301 Allergic rhinitis due to pollen: Secondary | ICD-10-CM | POA: Diagnosis not present

## 2021-10-03 DIAGNOSIS — J3081 Allergic rhinitis due to animal (cat) (dog) hair and dander: Secondary | ICD-10-CM | POA: Diagnosis not present

## 2021-10-03 DIAGNOSIS — J3089 Other allergic rhinitis: Secondary | ICD-10-CM | POA: Diagnosis not present

## 2021-10-10 DIAGNOSIS — J301 Allergic rhinitis due to pollen: Secondary | ICD-10-CM | POA: Diagnosis not present

## 2021-10-10 DIAGNOSIS — J3089 Other allergic rhinitis: Secondary | ICD-10-CM | POA: Diagnosis not present

## 2021-10-10 DIAGNOSIS — J3081 Allergic rhinitis due to animal (cat) (dog) hair and dander: Secondary | ICD-10-CM | POA: Diagnosis not present

## 2021-10-15 ENCOUNTER — Other Ambulatory Visit (INDEPENDENT_AMBULATORY_CARE_PROVIDER_SITE_OTHER): Payer: Medicare PPO

## 2021-10-15 DIAGNOSIS — D649 Anemia, unspecified: Secondary | ICD-10-CM | POA: Diagnosis not present

## 2021-10-16 LAB — CBC WITH DIFFERENTIAL/PLATELET
Basophils Absolute: 0.1 10*3/uL (ref 0.0–0.1)
Basophils Relative: 0.8 % (ref 0.0–3.0)
Eosinophils Absolute: 0.9 10*3/uL — ABNORMAL HIGH (ref 0.0–0.7)
Eosinophils Relative: 7.8 % — ABNORMAL HIGH (ref 0.0–5.0)
HCT: 36.6 % (ref 36.0–46.0)
Hemoglobin: 11.5 g/dL — ABNORMAL LOW (ref 12.0–15.0)
Lymphocytes Relative: 26.8 % (ref 12.0–46.0)
Lymphs Abs: 3 10*3/uL (ref 0.7–4.0)
MCHC: 31.5 g/dL (ref 30.0–36.0)
MCV: 90.8 fl (ref 78.0–100.0)
Monocytes Absolute: 1.2 10*3/uL — ABNORMAL HIGH (ref 0.1–1.0)
Monocytes Relative: 10.8 % (ref 3.0–12.0)
Neutro Abs: 6 10*3/uL (ref 1.4–7.7)
Neutrophils Relative %: 53.8 % (ref 43.0–77.0)
Platelets: 337 10*3/uL (ref 150.0–400.0)
RBC: 4.03 Mil/uL (ref 3.87–5.11)
RDW: 17.6 % — ABNORMAL HIGH (ref 11.5–15.5)
WBC: 11.2 10*3/uL — ABNORMAL HIGH (ref 4.0–10.5)

## 2021-10-16 LAB — IBC + FERRITIN
Ferritin: 12.4 ng/mL (ref 10.0–291.0)
Iron: 43 ug/dL (ref 42–145)
Saturation Ratios: 13.2 % — ABNORMAL LOW (ref 20.0–50.0)
TIBC: 324.8 ug/dL (ref 250.0–450.0)
Transferrin: 232 mg/dL (ref 212.0–360.0)

## 2021-10-17 ENCOUNTER — Other Ambulatory Visit: Payer: Self-pay

## 2021-10-17 DIAGNOSIS — D649 Anemia, unspecified: Secondary | ICD-10-CM

## 2021-10-18 DIAGNOSIS — J301 Allergic rhinitis due to pollen: Secondary | ICD-10-CM | POA: Diagnosis not present

## 2021-10-18 DIAGNOSIS — J3081 Allergic rhinitis due to animal (cat) (dog) hair and dander: Secondary | ICD-10-CM | POA: Diagnosis not present

## 2021-10-18 DIAGNOSIS — J3089 Other allergic rhinitis: Secondary | ICD-10-CM | POA: Diagnosis not present

## 2021-10-24 DIAGNOSIS — J301 Allergic rhinitis due to pollen: Secondary | ICD-10-CM | POA: Diagnosis not present

## 2021-10-24 DIAGNOSIS — J3081 Allergic rhinitis due to animal (cat) (dog) hair and dander: Secondary | ICD-10-CM | POA: Diagnosis not present

## 2021-10-24 DIAGNOSIS — J3089 Other allergic rhinitis: Secondary | ICD-10-CM | POA: Diagnosis not present

## 2021-10-31 DIAGNOSIS — R519 Headache, unspecified: Secondary | ICD-10-CM | POA: Diagnosis not present

## 2021-10-31 DIAGNOSIS — J3089 Other allergic rhinitis: Secondary | ICD-10-CM | POA: Diagnosis not present

## 2021-10-31 DIAGNOSIS — H1045 Other chronic allergic conjunctivitis: Secondary | ICD-10-CM | POA: Diagnosis not present

## 2021-10-31 DIAGNOSIS — J301 Allergic rhinitis due to pollen: Secondary | ICD-10-CM | POA: Diagnosis not present

## 2021-10-31 DIAGNOSIS — J4540 Moderate persistent asthma, uncomplicated: Secondary | ICD-10-CM | POA: Diagnosis not present

## 2021-11-05 ENCOUNTER — Encounter: Payer: Self-pay | Admitting: Family Medicine

## 2021-11-05 ENCOUNTER — Ambulatory Visit: Payer: Medicare PPO | Admitting: Family Medicine

## 2021-11-05 VITALS — BP 122/44 | HR 70 | Ht 64.5 in | Wt 275.2 lb

## 2021-11-05 DIAGNOSIS — D649 Anemia, unspecified: Secondary | ICD-10-CM | POA: Diagnosis not present

## 2021-11-05 DIAGNOSIS — E669 Obesity, unspecified: Secondary | ICD-10-CM

## 2021-11-05 DIAGNOSIS — I10 Essential (primary) hypertension: Secondary | ICD-10-CM

## 2021-11-05 DIAGNOSIS — R739 Hyperglycemia, unspecified: Secondary | ICD-10-CM | POA: Diagnosis not present

## 2021-11-05 NOTE — Patient Instructions (Signed)
Updating labs today. We will let you know if we need to make any changes.

## 2021-11-05 NOTE — Progress Notes (Signed)
Established Patient Office Visit  Subjective   [name], female    DOB: 1950-06-19  Age: 71 y.o. MRN: [id number]  CC: routine f/u    HPI Patient is here for 76-month follow-up.  Doing well overall. No new concerns. She admits to being out of shape, but she is motivated to get in better shape and lose some weight before her trip to [place] in October.    HYPERLIPIDEMIA - medications: none - compliance: n/a - medication SEs: n/a The 10-year ASCVD risk score (Arnett DK, et al., 2019) is: 7.6%   Values used to calculate the score:     Age: 36 years     Sex: Female     Is Non-Hispanic African American: Yes     Diabetic: No     Tobacco smoker: No     Systolic Blood Pressure: 122 mmHg     Is BP treated: Yes     HDL Cholesterol: 45.8 mg/dL     Total Cholesterol: 143 mg/dL   HYPERTENSION: - Medications: Azor 10-40 mg daily - Compliance: good - Checking BP at home: no - Denies any SOB, recurrent headaches, CP, vision changes, LE edema, dizziness, palpitations, or medication side effects. - Diet: general - Exercise: none    OBESITY: - Admits to not focusing on her health lately, but she is motivated to get in better health before her big upcoming trip.  - No new symptoms or concerns.   HYPERGLYCEMIA: - Checking BG at home: no; A1c 5.7% in February - Medications: none, lifestyle management - Compliance: good - Diet: general diet, no restrictions - Exercise: none - Denies symptoms of hypoglycemia, polyuria, polydipsia, numbness extremities, foot ulcers/trauma, wounds that are not healing, medication side effects    ANEMIA: - was slightly worse at February appt. iFob ordered and negative. She was started on iron daily. Repeat labs were improving, but CBC with mild WBC elevation- she was recommended to repeat in 1 month, but has not had this done yet, scheduled for next week per patient (will get today since she is getting labs anyways). - States she is  taking OTC iron supplement every day.  - No signs of bleeding, unusual fatigue, etc.                ROS All review of systems negative except what is listed in the HPI    Objective:     BP (!) 122/44   Pulse 70   Ht 5' 4.5" (1.638 m)   Wt 275 lb 3.2 oz (124.8 kg)   SpO2 100%   BMI 46.51 kg/m    Physical Exam Vitals reviewed.  Constitutional:      General: She is not in acute distress.    Appearance: Normal appearance. She is obese. She is not ill-appearing.  Cardiovascular:     Rate and Rhythm: Normal rate and regular rhythm.  Pulmonary:     Effort: Pulmonary effort is normal.     Breath sounds: Normal breath sounds.  Skin:    General: Skin is warm and dry.     Capillary Refill: Capillary refill takes less than 2 seconds.  Neurological:     Mental Status: She is alert and oriented to person, place, and time.  Psychiatric:        Mood and Affect: Mood normal.        Behavior: Behavior normal.        Thought Content: Thought content normal.  Judgment: Judgment normal.     No results found for any visits on 11/05/21.    The 10-year ASCVD risk score (Arnett DK, et al., 2019) is: 7.6%    Assessment & Plan:   Problem List Items Addressed This Visit       Cardiovascular and Mediastinum   Essential hypertension    Blood pressure is at goal for age and co-morbidities.  I recommend continuing current Azor.   - BP goal <130/80 - monitor and log blood pressures at home - check around the same time each day in a relaxed setting - Limit salt to <2000 mg/day - Follow DASH eating plan (heart healthy diet) - limit alcohol to 2 standard drinks per day for men and 1 per day for women - avoid tobacco products - get at least 2 hours of regular aerobic exercise weekly Patient aware of signs/symptoms requiring further/urgent evaluation. Labs updated today.       Relevant Orders   Comprehensive metabolic panel   Lipid panel     Other   Obesity     Discussed diet and exercise. She is motivated to make changes given her upcoming trip.        Relevant Orders   Hemoglobin A1c   CBC   Comprehensive metabolic panel   Lipid panel   Hyperglycemia    Previously stable A1c at 5.7%. Rechecking labs today. Continue working on healthy, low-carb diet and regular exercise.       Relevant Orders   Hemoglobin A1c   Anemia - Primary    Starting to improve at last check. Updating labs today. No new symptoms or concerns.        Relevant Orders   CBC    Return in about 4 months (around 03/07/2022) for routine f/u PCP.    Clayborne Dana, NP

## 2021-11-05 NOTE — Assessment & Plan Note (Signed)
Previously stable A1c at 5.7%. Rechecking labs today. Continue working on healthy, low-carb diet and regular exercise.

## 2021-11-05 NOTE — Assessment & Plan Note (Signed)
Discussed diet and exercise. She is motivated to make changes given her upcoming trip.

## 2021-11-05 NOTE — Assessment & Plan Note (Signed)
Starting to improve at last check. Updating labs today. No new symptoms or concerns.

## 2021-11-05 NOTE — Assessment & Plan Note (Signed)
Blood pressure is at goal for age and co-morbidities.  I recommend continuing current Azor.   - BP goal <130/80 - monitor and log blood pressures at home - check around the same time each day in a relaxed setting - Limit salt to <2000 mg/day - Follow DASH eating plan (heart healthy diet) - limit alcohol to 2 standard drinks per day for men and 1 per day for women - avoid tobacco products - get at least 2 hours of regular aerobic exercise weekly Patient aware of signs/symptoms requiring further/urgent evaluation. Labs updated today.

## 2021-11-06 LAB — LIPID PANEL
Cholesterol: 147 mg/dL (ref 0–200)
HDL: 47.3 mg/dL (ref >39.00)
LDL Cholesterol: 86 mg/dL (ref 0–99)
NonHDL: 99.85
Total CHOL/HDL Ratio: 3
Triglycerides: 67 mg/dL (ref 0.0–149.0)
VLDL: 13.4 mg/dL (ref 0.0–40.0)

## 2021-11-06 LAB — CBC
HCT: 38.6 % (ref 36.0–46.0)
Hemoglobin: 12.4 g/dL (ref 12.0–15.0)
MCHC: 32.3 g/dL (ref 30.0–36.0)
MCV: 91.5 fl (ref 78.0–100.0)
Platelets: 336 10*3/uL (ref 150.0–400.0)
RBC: 4.22 Mil/uL (ref 3.87–5.11)
RDW: 17.5 % — ABNORMAL HIGH (ref 11.5–15.5)
WBC: 10.9 10*3/uL — ABNORMAL HIGH (ref 4.0–10.5)

## 2021-11-06 LAB — COMPREHENSIVE METABOLIC PANEL
ALT: 15 U/L (ref 0–35)
AST: 18 U/L (ref 0–37)
Albumin: 4.1 g/dL (ref 3.5–5.2)
Alkaline Phosphatase: 70 U/L (ref 39–117)
BUN: 14 mg/dL (ref 6–23)
CO2: 28 mEq/L (ref 19–32)
Calcium: 9.5 mg/dL (ref 8.4–10.5)
Chloride: 104 mEq/L (ref 96–112)
Creatinine, Ser: 0.94 mg/dL (ref 0.40–1.20)
GFR: 62.1 mL/min (ref >60.00)
Glucose, Bld: 92 mg/dL (ref 70–99)
Potassium: 4.1 mEq/L (ref 3.5–5.1)
Sodium: 140 mEq/L (ref 135–145)
Total Bilirubin: 0.5 mg/dL (ref 0.2–1.2)
Total Protein: 7.5 g/dL (ref 6.0–8.3)

## 2021-11-06 LAB — HEMOGLOBIN A1C: Hgb A1c MFr Bld: 6.2 % (ref 4.6–6.5)

## 2021-11-14 ENCOUNTER — Other Ambulatory Visit: Payer: Self-pay | Admitting: Family Medicine

## 2021-11-30 ENCOUNTER — Ambulatory Visit (HOSPITAL_BASED_OUTPATIENT_CLINIC_OR_DEPARTMENT_OTHER)
Admission: RE | Admit: 2021-11-30 | Discharge: 2021-11-30 | Disposition: A | Discharge status: Home / Self Care | Payer: Medicare PPO | Source: Ambulatory Visit | Attending: Cardiology | Admitting: Cardiology

## 2021-11-30 DIAGNOSIS — R079 Chest pain, unspecified: Secondary | ICD-10-CM | POA: Diagnosis not present

## 2021-11-30 DIAGNOSIS — I10 Essential (primary) hypertension: Secondary | ICD-10-CM | POA: Diagnosis not present

## 2021-11-30 DIAGNOSIS — R0609 Other forms of dyspnea: Secondary | ICD-10-CM | POA: Diagnosis not present

## 2021-11-30 DIAGNOSIS — M7989 Other specified soft tissue disorders: Secondary | ICD-10-CM | POA: Diagnosis not present

## 2021-11-30 DIAGNOSIS — G4733 Obstructive sleep apnea (adult) (pediatric): Secondary | ICD-10-CM | POA: Insufficient documentation

## 2021-11-30 DIAGNOSIS — E669 Obesity, unspecified: Secondary | ICD-10-CM | POA: Diagnosis not present

## 2021-11-30 NOTE — Progress Notes (Signed)
  Echocardiogram 2D Echocardiogram has been performed.  Roosvelt Maser F 11/30/2021, 10:40 AM

## 2021-12-03 LAB — ECHOCARDIOGRAM COMPLETE
AR max vel: 1.54 cm2
AV Area VTI: 1.67 cm2
AV Area mean vel: 1.72 cm2
AV Mean grad: 4 mmHg
AV Peak grad: 7.4 mmHg
Ao pk vel: 1.36 m/s
Area-P 1/2: 3.03 cm2
S' Lateral: 3.5 cm

## 2021-12-05 ENCOUNTER — Telehealth: Payer: Self-pay

## 2021-12-05 NOTE — Telephone Encounter (Signed)
Results reviewed with pt as per Dr. Krasowski's note.  Pt verbalized understanding and had no additional questions. Routed to PCP  

## 2021-12-13 ENCOUNTER — Other Ambulatory Visit: Payer: Self-pay | Admitting: Internal Medicine

## 2021-12-13 ENCOUNTER — Ambulatory Visit: Payer: Medicare PPO | Admitting: Cardiology

## 2021-12-13 ENCOUNTER — Encounter: Payer: Self-pay | Admitting: Cardiology

## 2021-12-13 VITALS — BP 130/72 | HR 69 | Ht 64.5 in | Wt 269.1 lb

## 2021-12-13 DIAGNOSIS — J4540 Moderate persistent asthma, uncomplicated: Secondary | ICD-10-CM | POA: Diagnosis not present

## 2021-12-13 DIAGNOSIS — E669 Obesity, unspecified: Secondary | ICD-10-CM

## 2021-12-13 DIAGNOSIS — M7989 Other specified soft tissue disorders: Secondary | ICD-10-CM

## 2021-12-13 DIAGNOSIS — R0609 Other forms of dyspnea: Secondary | ICD-10-CM | POA: Diagnosis not present

## 2021-12-13 DIAGNOSIS — I10 Essential (primary) hypertension: Secondary | ICD-10-CM | POA: Diagnosis not present

## 2021-12-13 DIAGNOSIS — G4733 Obstructive sleep apnea (adult) (pediatric): Secondary | ICD-10-CM

## 2021-12-13 NOTE — Patient Instructions (Signed)

## 2021-12-13 NOTE — Progress Notes (Signed)
Cardiology Office Note:    Date:  12/13/2021   ID:  Terri [name], DOB 1950/06/11, MRN [id number]  PCP:  Bradd Canary, MD  Cardiologist:  Gypsy Balsam, MD    Referring MD: Bradd Canary, MD   Chief Complaint  Patient presents with   Follow-up  Doing well  History of Present Illness:    Terri [name] is a 71 y.o. female past medical history significant for low normal ejection fraction, obstructive sleep apnea only mild, asthma.  Comes today to my office for follow-up.  Overall she is doing very well.  Denies have any chest pain tightness squeezing pressure burning chest, so far cardiac work-up including coronary CT angio for calcium score which was 0.  She did not have obstructive disease.  Her echocardiogram showed low normal ejection fraction however repeated echocardiogram done this year showed normal left ventricle ejection fraction.  She is excited because she is going go for a trip to a [place] and she is trying to exercise on the regular basis and close her stamina have no difficulty doing it  Past Medical History:  Diagnosis Date   Allergic rhinitis    Apnea, sleep 06/28/2012   Previously used CPAP   Asthma    Atypical chest pain 01/14/2017   Back pain    Decreased visual acuity 08/05/2016   Depression 06/19/2015   Environmental and seasonal allergies    GERD (gastroesophageal reflux disease)    Heartburn    Hip pain, right 06/28/2012   HTN (hypertension)    Hypokalemia 01/14/2017   Leg pain    Morbid obesity (HCC) 04/11/2008   Qualifier: Diagnosis of  By: [name]    Numbness in feet 05/06/2016   Obesity    Osteoarthritis 01/16/2010   Qualifier: Diagnosis of  By: [name]    Preventative health care 06/25/2015   Shortness of breath    Shortness of breath on exertion    Sleep apnea    Swelling of extremity    Tinnitus    Vertigo     Past Surgical History:  Procedure Laterality Date   BIOPSY BREAST  1997   left    childbirth      COLONOSCOPY  2004   UPPER GASTROINTESTINAL ENDOSCOPY  03/01/2019    Current Medications: Current Meds  Medication Sig   albuterol (VENTOLIN HFA) 108 (90 Base) MCG/ACT inhaler INHALE 1-2 PUFFS INTO THE LUNGS EVERY 6 (SIX) HOURS AS NEEDED FOR WHEEZING OR SHORTNESS OF BREATH. (Patient taking differently: Inhale 2 puffs into the lungs every 6 (six) hours as needed for wheezing or shortness of breath.)   amLODipine-olmesartan (AZOR) 10-40 MG tablet TAKE 1 TABLET BY MOUTH EVERY DAY (Patient taking differently: Take 1 tablet by mouth daily.)   dexlansoprazole (DEXILANT) 60 MG capsule TAKE 1 CAPSULE (60 MG TOTAL) BY MOUTH DAILY BEFORE BREAKFAST. (Patient taking differently: Take 60 mg by mouth daily.)   EPINEPHrine 0.3 mg/0.3 mL IJ SOAJ injection Inject 1 Dose into the muscle as needed for allergies. Inject after allergy shot for anaphylaxis   famotidine (PEPCID) 40 MG tablet Take 40 mg by mouth daily.   levocetirizine (XYZAL) 5 MG tablet Take 5 mg by mouth every evening.   montelukast (SINGULAIR) 10 MG tablet Take 10 mg by mouth at bedtime.   oxybutynin (DITROPAN-XL) 10 MG 24 hr tablet Take 10 mg by mouth daily.      Allergies:   Sulfamethoxazole-trimethoprim, Sulfonamide derivatives, and Sulfa antibiotics  Social History   Socioeconomic History   Marital status: Divorced    Spouse name: Not on file   Number of children: 1   Years of education: Not on file   Highest education level: Not on file  Occupational History   Occupation: HS music teacher- [employer]  Tobacco Use   Smoking status: Never   Smokeless tobacco: Never  Vaping Use   Vaping Use: Never used  Substance and Sexual Activity   Alcohol use: Yes    Comment: social   Drug use: No   Sexual activity: Not on file    Comment: band teacher, 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 no dietary restrictions  Other Topics Concern   Not on file  Social History Narrative   Single/divorced   1 son born 1 - sometimes there w/ her, leukemia  2016   Music teacher [employer]   Caffeine use 1 cup AM   Never smoker/tobacco   No drugs   Rare-occ EtOHSmoking    Social Determinants of Health   Financial Resource Strain: Low Risk  (06/12/2021)   Overall Financial Resource Strain (CARDIA)    Difficulty of Paying Living Expenses: Not hard at all  Food Insecurity: No Food Insecurity (06/12/2021)   Hunger Vital Sign    Worried About Running Out of Food in the Last Year: Never true    Ran Out of Food in the Last Year: Never true  Transportation Needs: No Transportation Needs (06/12/2021)   PRAPARE - Administrator, Civil Service (Medical): No    Lack of Transportation (Non-Medical): No  Physical Activity: Inactive (06/12/2021)   Exercise Vital Sign    Days of Exercise per Week: 0 days    Minutes of Exercise per Session: 0 min  Stress: No Stress Concern Present (06/12/2021)   [employer] of Occupational Health - Occupational Stress Questionnaire    Feeling of Stress : Not at all  Social Connections: Moderately Integrated (06/12/2021)   Social Connection and Isolation Panel [NHANES]    Frequency of Communication with Friends and Family: More than three times a week    Frequency of Social Gatherings with Friends and Family: More than three times a week    Attends Religious Services: More than 4 times per year    Active Member of [employer] or Organizations: Yes    Attends Engineer, structural: More than 4 times per year    Marital Status: Divorced     Family History: The patient's family history includes Cancer in her father and paternal grandfather; Esophageal cancer in her father; Heart disease in her father; Hypertension in her father and mother; Liver disease in her brother and mother; Mental illness in her brother; Stroke in her maternal grandmother and mother. There is no history of Colitis, Colon cancer, Rectal cancer, or Stomach cancer. ROS:   Please see the history of present illness.    All 14 point  review of systems negative except as described per history of present illness  EKGs/Labs/Other Studies Reviewed:      Recent Labs: 03/07/2021: NT-Pro BNP 21 07/10/2021: TSH 2.69 11/05/2021: ALT 15; BUN 14; Creatinine, Ser 0.94; Hemoglobin 12.4; Platelets 336.0; Potassium 4.1; Sodium 140  Recent Lipid Panel    Component Value Date/Time   CHOL 147 11/05/2021 1632   CHOL 150 08/11/2017 1155   TRIG 67.0 11/05/2021 1632   HDL 47.30 11/05/2021 1632   HDL 52 08/11/2017 1155   CHOLHDL 3 11/05/2021 1632   VLDL 13.4 11/05/2021  1632   LDLCALC 86 11/05/2021 1632   LDLCALC 84 08/11/2017 1155    Physical Exam:    VS:  BP 130/72 (BP Location: Left Arm, Patient Position: Sitting)   Pulse 69   Ht 5' 4.5" (1.638 m)   Wt 269 lb 1.3 oz (122.1 kg)   SpO2 95%   BMI 45.47 kg/m     Wt Readings from Last 3 Encounters:  12/13/21 269 lb 1.3 oz (122.1 kg)  11/05/21 275 lb 3.2 oz (124.8 kg)  07/10/21 273 lb 3.2 oz (123.9 kg)     GEN:  Well nourished, well developed in no acute distress HEENT: Normal NECK: No JVD; No carotid bruits LYMPHATICS: No lymphadenopathy CARDIAC: RRR, no murmurs, no rubs, no gallops RESPIRATORY:  Clear to auscultation without rales, wheezing or rhonchi  ABDOMEN: Soft, non-tender, non-distended MUSCULOSKELETAL:  No edema; No deformity  SKIN: Warm and dry LOWER EXTREMITIES: no swelling NEUROLOGIC:  Alert and oriented x 3 PSYCHIATRIC:  Normal affect   ASSESSMENT:    1. Essential hypertension   2. OSA (obstructive sleep apnea)   3. Moderate persistent asthma, uncomplicated   4. Obesity, unspecified classification, unspecified obesity type, unspecified whether serious comorbidity present   5. Swelling of extremity   6. Dyspnea on exertion    PLAN:    In order of problems listed above:  Essential hypertension blood pressure seems to well controlled continue present management. Obstructive sleep apnea she is using some dental device apparently only mild she is happy  the way it is hopefully with exercise she will lose some weight which will help with obstructive sleep apnea Obesity again a problem she understands however she is trying to get more active and doing quite well with that hopefully will improve with Left ventricle ejection fraction lower limits of normal now normal continue monitoring. Dyslipidemia I did review K PN which show LDL 86 HDL 47.  She needs to continue exercises and have reassess in about 6 months.   Medication Adjustments/Labs and Tests Ordered: Current medicines are reviewed at length with the patient today.  Concerns regarding medicines are outlined above.  No orders of the defined types were placed in this encounter.  Medication changes: No orders of the defined types were placed in this encounter.   Signed, Georgeanna Lea, MD, [facility] 12/13/2021 9:33 AM    [facility]

## 2021-12-17 ENCOUNTER — Other Ambulatory Visit: Payer: Self-pay | Admitting: Family Medicine

## 2021-12-25 DIAGNOSIS — H40013 Open angle with borderline findings, low risk, bilateral: Secondary | ICD-10-CM | POA: Diagnosis not present

## 2021-12-25 DIAGNOSIS — H02831 Dermatochalasis of right upper eyelid: Secondary | ICD-10-CM | POA: Diagnosis not present

## 2021-12-25 DIAGNOSIS — H02834 Dermatochalasis of left upper eyelid: Secondary | ICD-10-CM | POA: Diagnosis not present

## 2021-12-25 DIAGNOSIS — H25813 Combined forms of age-related cataract, bilateral: Secondary | ICD-10-CM | POA: Diagnosis not present

## 2022-01-09 ENCOUNTER — Encounter (INDEPENDENT_AMBULATORY_CARE_PROVIDER_SITE_OTHER): Payer: Self-pay

## 2022-01-31 ENCOUNTER — Other Ambulatory Visit: Payer: Self-pay | Admitting: Family Medicine

## 2022-01-31 DIAGNOSIS — J309 Allergic rhinitis, unspecified: Secondary | ICD-10-CM

## 2022-01-31 DIAGNOSIS — R059 Cough, unspecified: Secondary | ICD-10-CM

## 2022-03-07 ENCOUNTER — Telehealth (INDEPENDENT_AMBULATORY_CARE_PROVIDER_SITE_OTHER): Payer: Medicare PPO | Admitting: Family Medicine

## 2022-03-07 DIAGNOSIS — E559 Vitamin D deficiency, unspecified: Secondary | ICD-10-CM | POA: Diagnosis not present

## 2022-03-07 DIAGNOSIS — J4520 Mild intermittent asthma, uncomplicated: Secondary | ICD-10-CM

## 2022-03-07 DIAGNOSIS — E669 Obesity, unspecified: Secondary | ICD-10-CM | POA: Diagnosis not present

## 2022-03-07 DIAGNOSIS — D649 Anemia, unspecified: Secondary | ICD-10-CM

## 2022-03-07 DIAGNOSIS — R12 Heartburn: Secondary | ICD-10-CM | POA: Diagnosis not present

## 2022-03-07 DIAGNOSIS — R739 Hyperglycemia, unspecified: Secondary | ICD-10-CM | POA: Diagnosis not present

## 2022-03-07 DIAGNOSIS — I10 Essential (primary) hypertension: Secondary | ICD-10-CM

## 2022-03-07 DIAGNOSIS — E782 Mixed hyperlipidemia: Secondary | ICD-10-CM | POA: Diagnosis not present

## 2022-03-07 NOTE — Assessment & Plan Note (Signed)
Encourage heart healthy diet such as MIND or DASH diet, increase exercise, avoid trans fats, simple carbohydrates and processed foods, consider a krill or fish or flaxseed oil cap daily.  °

## 2022-03-07 NOTE — Assessment & Plan Note (Signed)
Avoid offending foods, start probiotics. Do not eat large meals in late evening and consider raising head of bed. Had a recent

## 2022-03-07 NOTE — Assessment & Plan Note (Signed)
Well controlled, no changes to meds. Encouraged heart healthy diet such as the DASH diet and exercise as tolerated.  °

## 2022-03-07 NOTE — Assessment & Plan Note (Signed)
hgba1c acceptable, minimize simple carbs. Increase exercise as tolerated.  

## 2022-03-08 NOTE — Assessment & Plan Note (Signed)
She does still note some dyspnea on exertion but not worsening. [facility] and she finds that helpful.

## 2022-03-08 NOTE — Progress Notes (Signed)
MyChart Video Visit    Virtual Visit via Video Note   This visit type was conducted due to national recommendations for restrictions regarding the COVID-19 Pandemic (e.g. social distancing) in an effort to limit this patient's exposure and mitigate transmission in our community. This patient is at least at moderate risk for complications without adequate follow up. This format is felt to be most appropriate for this patient at this time. Physical exam was limited by quality of the video and audio technology used for the visit. Shamaine, CMA was able to get the patient set up on a video visit.  Patient location: home Patient and provider in visit Provider location: Office  I discussed the limitations of evaluation and management by telemedicine and the availability of in person appointments. The patient expressed understanding and agreed to proceed.  Visit Date: 03/07/2022  Today's healthcare provider: Penni Homans, MD     Subjective:    [name], female    DOB: Aug 26, 1950, 71 y.o.   MRN: [id number]  Chief Complaint  Patient presents with   Follow-up    Follow up    HPI Patient is in today for follow up on chronic medical concerns. Overall she is doing well. No recent febrile illness or acute hospitalizations. No complaints of polyuria or polydipsia. She is exercising at water aerobics several times a week. She is trying to increase her activity level and trying to eat well. She is getting ready to head out of the country to [place]. She is feeling well enough to go. She does still note some dyspnea with exertion but notes taking a puff of albuterol prior to exertion. Denies CP/palp/HA/congestion/fevers/GI or GU c/o. Taking meds as prescribed   Past Medical History:  Diagnosis Date   Allergic rhinitis    Apnea, sleep 06/28/2012   Previously used CPAP   Asthma    Atypical chest pain 01/14/2017   Back pain    Decreased visual acuity 08/05/2016   Depression  06/19/2015   Environmental and seasonal allergies    GERD (gastroesophageal reflux disease)    Heartburn    Hip pain, right 06/28/2012   HTN (hypertension)    Hypokalemia 01/14/2017   Leg pain    Morbid obesity ([place]) 04/11/2008   Qualifier: Diagnosis of  By: Shawna Orleans DO, Sandy Salaam    Numbness in feet 05/06/2016   Obesity    Osteoarthritis 01/16/2010   Qualifier: Diagnosis of  By: Wynona Luna    Preventative health care 06/25/2015   Shortness of breath    Shortness of breath on exertion    Sleep apnea    Swelling of extremity    Tinnitus    Vertigo     Past Surgical History:  Procedure Laterality Date   BIOPSY BREAST  1997   left    childbirth     COLONOSCOPY  2004   UPPER GASTROINTESTINAL ENDOSCOPY  03/01/2019    Family History  Problem Relation Age of Onset   Stroke Mother    Liver disease Mother        hepatitis b   Hypertension Mother    Cancer Father        esophagus, prostate   Hypertension Father    Heart disease Father    Esophageal cancer Father    Liver disease Brother        hep c   Mental illness Brother        PTSD from [place]   Stroke  Maternal Grandmother    Cancer Paternal Grandfather        prostate   Colitis Neg Hx    Colon cancer Neg Hx    Rectal cancer Neg Hx    Stomach cancer Neg Hx     Social History   Socioeconomic History   Marital status: Divorced    Spouse name: Not on file   Number of children: 1   Years of education: Not on file   Highest education level: Not on file  Occupational History   Occupation: HS music teacher- [employer]  Tobacco Use   Smoking status: Never   Smokeless tobacco: Never  Vaping Use   Vaping Use: Never used  Substance and Sexual Activity   Alcohol use: Yes    Comment: social   Drug use: No   Sexual activity: Not on file    Comment: band teacher, 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 no dietary restrictions  Other Topics Concern   Not on file  Social History Narrative   Single/divorced   1 son born  28 - sometimes there w/ her, leukemia 2016   Music teacher [employer]   Caffeine use 1 cup AM   Never smoker/tobacco   No drugs   Rare-occ EtOHSmoking    Social Determinants of Health   Financial Resource Strain: Low Risk  (06/12/2021)   Overall Financial Resource Strain (CARDIA)    Difficulty of Paying Living Expenses: Not hard at all  Food Insecurity: No Food Insecurity (06/12/2021)   Hunger Vital Sign    Worried About Running Out of Food in the Last Year: Never true    Ran Out of Food in the Last Year: Never true  Transportation Needs: No Transportation Needs (06/12/2021)   PRAPARE - Hydrologist (Medical): No    Lack of Transportation (Non-Medical): No  Physical Activity: Inactive (06/12/2021)   Exercise Vital Sign    Days of Exercise per Week: 0 days    Minutes of Exercise per Session: 0 min  Stress: No Stress Concern Present (06/12/2021)   [place]    Feeling of Stress : Not at all  Social Connections: Moderately Integrated (06/12/2021)   Social Connection and Isolation Panel [NHANES]    Frequency of Communication with Friends and Family: More than three times a week    Frequency of Social Gatherings with Friends and Family: More than three times a week    Attends Religious Services: More than 4 times per year    Active Member of [employer] or Organizations: Yes    Attends Music therapist: More than 4 times per year    Marital Status: Divorced  Intimate Partner Violence: Not At Risk (06/12/2021)   Humiliation, Afraid, Rape, and Kick questionnaire    Fear of Current or Ex-Partner: No    Emotionally Abused: No    Physically Abused: No    Sexually Abused: No    Outpatient Medications Prior to Visit  Medication Sig Dispense Refill   albuterol (VENTOLIN HFA) 108 (90 Base) MCG/ACT inhaler INHALE 1-2 PUFFS INTO THE LUNGS EVERY 6 (SIX) HOURS AS NEEDED FOR WHEEZING OR  SHORTNESS OF BREATH. (Patient taking differently: Inhale 2 puffs into the lungs every 6 (six) hours as needed for wheezing or shortness of breath.) 18 each 5   amLODipine-olmesartan (AZOR) 10-40 MG tablet TAKE 1 TABLET BY MOUTH EVERY DAY 90 tablet 1   dexlansoprazole (DEXILANT) 60 MG capsule TAKE  1 CAPSULE (60 MG TOTAL) BY MOUTH DAILY BEFORE BREAKFAST. 90 capsule 0   EPINEPHrine 0.3 mg/0.3 mL IJ SOAJ injection Inject 1 Dose into the muscle as needed for allergies. Inject after allergy shot for anaphylaxis     famotidine (PEPCID) 40 MG tablet Take 40 mg by mouth daily.     levocetirizine (XYZAL) 5 MG tablet Take 5 mg by mouth every evening.     montelukast (SINGULAIR) 10 MG tablet Take 1 tablet (10 mg total) by mouth at bedtime. 90 tablet 1   oxybutynin (DITROPAN-XL) 10 MG 24 hr tablet Take 10 mg by mouth daily.      No facility-administered medications prior to visit.    Allergies  Allergen Reactions   Sulfamethoxazole-Trimethoprim     Other reaction(s): Unknown   Sulfonamide Derivatives    Sulfa Antibiotics Rash    Review of Systems  Constitutional:  Negative for fever and malaise/fatigue.  HENT:  Negative for congestion.   Eyes:  Negative for blurred vision.  Respiratory:  Positive for shortness of breath.   Cardiovascular:  Negative for chest pain, palpitations and leg swelling.  Gastrointestinal:  Negative for abdominal pain, blood in stool and nausea.  Genitourinary:  Negative for dysuria and frequency.  Musculoskeletal:  Negative for falls.  Skin:  Negative for rash.  Neurological:  Negative for dizziness, loss of consciousness and headaches.  Endo/Heme/Allergies:  Negative for environmental allergies.  Psychiatric/Behavioral:  Negative for depression. The patient is not nervous/anxious.        Objective:    Physical Exam Constitutional:      General: She is not in acute distress.    Appearance: Normal appearance. She is not ill-appearing or toxic-appearing.  HENT:      Head: Normocephalic and atraumatic.     Right Ear: External ear normal.     Left Ear: External ear normal.     Nose: Nose normal.  Eyes:     General:        Right eye: No discharge.        Left eye: No discharge.  Pulmonary:     Effort: Pulmonary effort is normal.  Skin:    Findings: No rash.  Neurological:     Mental Status: She is alert and oriented to person, place, and time.  Psychiatric:        Behavior: Behavior normal.     There were no vitals taken for this visit. Wt Readings from Last 3 Encounters:  12/13/21 269 lb 1.3 oz (122.1 kg)  11/05/21 275 lb 3.2 oz (124.8 kg)  07/10/21 273 lb 3.2 oz (123.9 kg)    Diabetic Foot Exam - Simple   No data filed    Lab Results  Component Value Date   WBC 10.9 (H) 11/05/2021   HGB 12.4 11/05/2021   HCT 38.6 11/05/2021   PLT 336.0 11/05/2021   GLUCOSE 92 11/05/2021   CHOL 147 11/05/2021   TRIG 67.0 11/05/2021   HDL 47.30 11/05/2021   LDLCALC 86 11/05/2021   ALT 15 11/05/2021   AST 18 11/05/2021   NA 140 11/05/2021   K 4.1 11/05/2021   CL 104 11/05/2021   CREATININE 0.94 11/05/2021   BUN 14 11/05/2021   CO2 28 11/05/2021   TSH 2.69 07/10/2021   HGBA1C 6.2 11/05/2021    Lab Results  Component Value Date   TSH 2.69 07/10/2021   Lab Results  Component Value Date   WBC 10.9 (H) 11/05/2021   HGB 12.4 11/05/2021  HCT 38.6 11/05/2021   MCV 91.5 11/05/2021   PLT 336.0 11/05/2021   Lab Results  Component Value Date   NA 140 11/05/2021   K 4.1 11/05/2021   CO2 28 11/05/2021   GLUCOSE 92 11/05/2021   BUN 14 11/05/2021   CREATININE 0.94 11/05/2021   BILITOT 0.5 11/05/2021   ALKPHOS 70 11/05/2021   AST 18 11/05/2021   ALT 15 11/05/2021   PROT 7.5 11/05/2021   ALBUMIN 4.1 11/05/2021   CALCIUM 9.5 11/05/2021   ANIONGAP 7 03/19/2019   EGFR 59 (L) 03/07/2021   GFR 62.10 11/05/2021   Lab Results  Component Value Date   CHOL 147 11/05/2021   Lab Results  Component Value Date   HDL 47.30 11/05/2021    Lab Results  Component Value Date   LDLCALC 86 11/05/2021   Lab Results  Component Value Date   TRIG 67.0 11/05/2021   Lab Results  Component Value Date   CHOLHDL 3 11/05/2021   Lab Results  Component Value Date   HGBA1C 6.2 11/05/2021       Assessment & Plan:   Problem List Items Addressed This Visit     Hyperlipidemia, mixed    Encourage heart healthy diet such as MIND or DASH diet, increase exercise, avoid trans fats, simple carbohydrates and processed foods, consider a krill or fish or flaxseed oil cap daily.       Relevant Orders   Lipid panel   Obesity    Encouraged DASH or MIND diet, decrease po intake and increase exercise as tolerated. Needs 7-8 hours of sleep nightly. Avoid trans fats, eat small, frequent meals every 4-5 hours with lean proteins, complex carbs and healthy fats. Minimize simple carbs, high fat foods and processed foods      Essential hypertension    Well controlled, no changes to meds. Encouraged heart healthy diet such as the DASH diet and exercise as tolerated.       Relevant Orders   CBC   Comprehensive metabolic panel   TSH   Vitamin D deficiency - Primary    Supplement and monitor      Relevant Orders   VITAMIN D 25 Hydroxy (Vit-D Deficiency, Fractures)   Hyperglycemia    hgba1c acceptable, minimize simple carbs. Increase exercise as tolerated.      Relevant Orders   Hemoglobin A1c   Heartburn    Avoid offending foods, start probiotics. Do not eat large meals in late evening and consider raising head of bed. Had a recent      Mild intermittent asthma    She does still note some dyspnea on exertion but not worsening. [facility] and she finds that helpful.      Anemia    Has improved will monitor      Relevant Orders   Iron, TIBC and Ferritin Panel    I am having Terri [name] maintain her oxybutynin, levocetirizine, EPINEPHrine, famotidine, albuterol,  dexlansoprazole, amLODipine-olmesartan, and montelukast.  No orders of the defined types were placed in this encounter.   I discussed the assessment and treatment plan with the patient. The patient was provided an opportunity to ask questions and all were answered. The patient agreed with the plan and demonstrated an understanding of the instructions.   The patient was advised to call back or seek an in-person evaluation if the symptoms worsen or if the condition fails to improve as anticipated.  Penni Homans, MD [facility] [phone] (phone) [phone] (fax)  [place]

## 2022-03-08 NOTE — Assessment & Plan Note (Signed)
Supplement and monitor 

## 2022-03-08 NOTE — Assessment & Plan Note (Signed)
Encouraged DASH or MIND diet, decrease po intake and increase exercise as tolerated. Needs 7-8 hours of sleep nightly. Avoid trans fats, eat small, frequent meals every 4-5 hours with lean proteins, complex carbs and healthy fats. Minimize simple carbs, high fat foods and processed foods 

## 2022-03-08 NOTE — Assessment & Plan Note (Signed)
Has improved will monitor

## 2022-03-18 ENCOUNTER — Other Ambulatory Visit: Payer: Self-pay | Admitting: Internal Medicine

## 2022-03-26 ENCOUNTER — Telehealth: Payer: Self-pay | Admitting: Family Medicine

## 2022-03-26 ENCOUNTER — Other Ambulatory Visit: Payer: Self-pay

## 2022-03-26 NOTE — Addendum Note (Signed)
Addended by: Laure Kidney on: 03/26/2022 01:32 PM   Modules accepted: Orders

## 2022-03-26 NOTE — Telephone Encounter (Signed)
Labs changed to [facility]. °

## 2022-03-26 NOTE — Telephone Encounter (Signed)
Pt would like lab orders switched to [facility] in lab.

## 2022-03-29 ENCOUNTER — Other Ambulatory Visit: Payer: Self-pay | Admitting: Family Medicine

## 2022-03-29 ENCOUNTER — Other Ambulatory Visit: Payer: Medicare PPO

## 2022-06-25 DIAGNOSIS — H40013 Open angle with borderline findings, low risk, bilateral: Secondary | ICD-10-CM | POA: Diagnosis not present

## 2022-06-28 ENCOUNTER — Other Ambulatory Visit: Payer: Self-pay | Admitting: Family Medicine

## 2022-07-01 NOTE — Progress Notes (Unsigned)
   I, Peterson Lombard, LAT, ATC acting as a scribe for [name], MD.  Terri [name] is a 72 y.o. female who presents to [place] [facility] today for right shoulder pain.  Patient was last seen by Dr. Georgina Snell on 08/01/2020 for right lateral epicondylitis.  Today, patient reports right shoulder pain ongoing?  Patient locates pain to?  Neck pain: Radiates:  UE Numbness/tingling: UE Weakness: Aggravates: Treatments tried:  Dx imaging: 12/22/17 C-spine XR  Pertinent review of systems: ***  Relevant historical information: ***   Exam:  There were no vitals taken for this visit. General: Well Developed, well nourished, and in no acute distress.   MSK: ***    Lab and Radiology Results No results found for this or any previous visit (from the past 72 hour(s)). No results found.     Assessment and Plan: 72 y.o. female with ***   PDMP not reviewed this encounter. No orders of the defined types were placed in this encounter.  No orders of the defined types were placed in this encounter.    Discussed warning signs or symptoms. Please see discharge instructions. Patient expresses understanding.   ***

## 2022-07-02 ENCOUNTER — Ambulatory Visit: Payer: Medicare PPO | Admitting: Family Medicine

## 2022-07-02 ENCOUNTER — Ambulatory Visit (INDEPENDENT_AMBULATORY_CARE_PROVIDER_SITE_OTHER): Payer: Medicare PPO

## 2022-07-02 ENCOUNTER — Ambulatory Visit: Payer: Self-pay

## 2022-07-02 VITALS — BP 136/82 | HR 68 | Ht 64.5 in | Wt 274.0 lb

## 2022-07-02 DIAGNOSIS — J301 Allergic rhinitis due to pollen: Secondary | ICD-10-CM | POA: Diagnosis not present

## 2022-07-02 DIAGNOSIS — G8929 Other chronic pain: Secondary | ICD-10-CM

## 2022-07-02 DIAGNOSIS — M25511 Pain in right shoulder: Secondary | ICD-10-CM

## 2022-07-02 DIAGNOSIS — R519 Headache, unspecified: Secondary | ICD-10-CM | POA: Diagnosis not present

## 2022-07-02 DIAGNOSIS — J4540 Moderate persistent asthma, uncomplicated: Secondary | ICD-10-CM | POA: Diagnosis not present

## 2022-07-02 DIAGNOSIS — J3089 Other allergic rhinitis: Secondary | ICD-10-CM | POA: Diagnosis not present

## 2022-07-02 DIAGNOSIS — H1045 Other chronic allergic conjunctivitis: Secondary | ICD-10-CM | POA: Diagnosis not present

## 2022-07-02 NOTE — Patient Instructions (Addendum)
Thank you for coming in today.   Please get an Xray today before you leave   I've referred you to Physical Therapy.  Let us know if you don't hear from them in one week.   Check back in 6 weeks 

## 2022-07-02 NOTE — Progress Notes (Signed)
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  If not better with physical therapy we may consider an injection.

## 2022-07-18 ENCOUNTER — Ambulatory Visit: Payer: Medicare PPO | Attending: Family Medicine

## 2022-07-18 ENCOUNTER — Other Ambulatory Visit: Payer: Self-pay

## 2022-07-18 DIAGNOSIS — M6281 Muscle weakness (generalized): Secondary | ICD-10-CM

## 2022-07-18 DIAGNOSIS — R600 Localized edema: Secondary | ICD-10-CM | POA: Insufficient documentation

## 2022-07-18 DIAGNOSIS — G8929 Other chronic pain: Secondary | ICD-10-CM | POA: Insufficient documentation

## 2022-07-18 DIAGNOSIS — M25511 Pain in right shoulder: Secondary | ICD-10-CM
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
## 2022-07-25 ENCOUNTER — Ambulatory Visit: Payer: Medicare PPO

## 2022-07-25 ENCOUNTER — Ambulatory Visit: Payer: Medicare PPO | Admitting: Family Medicine

## 2022-07-25 DIAGNOSIS — M25511 Pain in right shoulder: Secondary | ICD-10-CM | POA: Diagnosis not present

## 2022-07-25 DIAGNOSIS — M6281 Muscle weakness (generalized): Secondary | ICD-10-CM

## 2022-07-25 DIAGNOSIS — R600 Localized edema: Secondary | ICD-10-CM | POA: Diagnosis not present

## 2022-07-25 DIAGNOSIS — G8929 Other chronic pain: Secondary | ICD-10-CM | POA: Diagnosis not present
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
## 2022-07-30 ENCOUNTER — Ambulatory Visit: Payer: Medicare PPO

## 2022-07-30 DIAGNOSIS — R600 Localized edema: Secondary | ICD-10-CM

## 2022-07-30 DIAGNOSIS — M6281 Muscle weakness (generalized): Secondary | ICD-10-CM

## 2022-07-30 DIAGNOSIS — G8929 Other chronic pain: Secondary | ICD-10-CM | POA: Diagnosis not present

## 2022-07-30 DIAGNOSIS — M25511 Pain in right shoulder: Secondary | ICD-10-CM | POA: Diagnosis not present
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
## 2022-07-31 DIAGNOSIS — Z1231 Encounter for screening mammogram for malignant neoplasm of breast: Secondary | ICD-10-CM | POA: Diagnosis not present

## 2022-07-31 LAB — HM MAMMOGRAPHY

## 2022-07-31 NOTE — Assessment & Plan Note (Signed)
Encourage heart healthy diet such as MIND or DASH diet, increase exercise, avoid trans fats, simple carbohydrates and processed foods, consider a krill or fish or flaxseed oil cap daily.  °

## 2022-07-31 NOTE — Assessment & Plan Note (Signed)
Supplement and monitor 

## 2022-07-31 NOTE — Assessment & Plan Note (Signed)
hgba1c acceptable, minimize simple carbs. Increase exercise as tolerated.  

## 2022-07-31 NOTE — Assessment & Plan Note (Signed)
No recent exacerbation 

## 2022-07-31 NOTE — Assessment & Plan Note (Signed)
Well controlled, no changes to meds. Encouraged heart healthy diet such as the DASH diet and exercise as tolerated.  °

## 2022-08-01 ENCOUNTER — Encounter: Payer: Self-pay | Admitting: Family Medicine

## 2022-08-01 ENCOUNTER — Ambulatory Visit: Payer: Medicare PPO | Admitting: Family Medicine

## 2022-08-01 ENCOUNTER — Ambulatory Visit: Payer: Medicare PPO

## 2022-08-01 VITALS — BP 128/78 | HR 73 | Temp 97.5°F | Resp 16 | Ht 64.0 in | Wt 272.2 lb

## 2022-08-01 DIAGNOSIS — R600 Localized edema: Secondary | ICD-10-CM | POA: Diagnosis not present

## 2022-08-01 DIAGNOSIS — M545 Low back pain, unspecified: Secondary | ICD-10-CM | POA: Diagnosis not present

## 2022-08-01 DIAGNOSIS — E559 Vitamin D deficiency, unspecified: Secondary | ICD-10-CM | POA: Diagnosis not present

## 2022-08-01 DIAGNOSIS — J4530 Mild persistent asthma, uncomplicated: Secondary | ICD-10-CM

## 2022-08-01 DIAGNOSIS — M6281 Muscle weakness (generalized): Secondary | ICD-10-CM | POA: Diagnosis not present

## 2022-08-01 DIAGNOSIS — D649 Anemia, unspecified: Secondary | ICD-10-CM

## 2022-08-01 DIAGNOSIS — R739 Hyperglycemia, unspecified: Secondary | ICD-10-CM | POA: Diagnosis not present

## 2022-08-01 DIAGNOSIS — I10 Essential (primary) hypertension: Secondary | ICD-10-CM | POA: Diagnosis not present

## 2022-08-01 DIAGNOSIS — M25511 Pain in right shoulder: Secondary | ICD-10-CM | POA: Diagnosis not present

## 2022-08-01 DIAGNOSIS — E782 Mixed hyperlipidemia: Secondary | ICD-10-CM | POA: Diagnosis not present

## 2022-08-01 DIAGNOSIS — G8929 Other chronic pain: Secondary | ICD-10-CM | POA: Diagnosis not present

## 2022-08-01 LAB — COMPREHENSIVE METABOLIC PANEL
ALT: 10 U/L (ref 0–35)
AST: 11 U/L (ref 0–37)
Albumin: 3.8 g/dL (ref 3.5–5.2)
Alkaline Phosphatase: 77 U/L (ref 39–117)
BUN: 15 mg/dL (ref 6–23)
CO2: 30 mEq/L (ref 19–32)
Calcium: 9.6 mg/dL (ref 8.4–10.5)
Chloride: 105 mEq/L (ref 96–112)
Creatinine, Ser: 0.96 mg/dL (ref 0.40–1.20)
GFR: 60.24 mL/min (ref >60.00)
Glucose, Bld: 93 mg/dL (ref 70–99)
Potassium: 4.1 mEq/L (ref 3.5–5.1)
Sodium: 142 mEq/L (ref 135–145)
Total Bilirubin: 0.6 mg/dL (ref 0.2–1.2)
Total Protein: 6.9 g/dL (ref 6.0–8.3)

## 2022-08-01 LAB — CBC WITH DIFFERENTIAL/PLATELET
Basophils Absolute: 0.1 10*3/uL (ref 0.0–0.1)
Basophils Relative: 0.8 % (ref 0.0–3.0)
Eosinophils Absolute: 0.6 10*3/uL (ref 0.0–0.7)
Eosinophils Relative: 5.9 % — ABNORMAL HIGH (ref 0.0–5.0)
HCT: 38.3 % (ref 36.0–46.0)
Hemoglobin: 12.2 g/dL (ref 12.0–15.0)
Lymphocytes Relative: 23.4 % (ref 12.0–46.0)
Lymphs Abs: 2.3 10*3/uL (ref 0.7–4.0)
MCHC: 31.9 g/dL (ref 30.0–36.0)
MCV: 93.5 fl (ref 78.0–100.0)
Monocytes Absolute: 1 10*3/uL (ref 0.1–1.0)
Monocytes Relative: 10.5 % (ref 3.0–12.0)
Neutro Abs: 5.8 10*3/uL (ref 1.4–7.7)
Neutrophils Relative %: 59.4 % (ref 43.0–77.0)
Platelets: 408 10*3/uL — ABNORMAL HIGH (ref 150.0–400.0)
RBC: 4.09 Mil/uL (ref 3.87–5.11)
RDW: 14.4 % (ref 11.5–15.5)
WBC: 9.8 10*3/uL (ref 4.0–10.5)

## 2022-08-01 LAB — LIPID PANEL
Cholesterol: 143 mg/dL (ref 0–200)
HDL: 44.5 mg/dL (ref >39.00)
LDL Cholesterol: 76 mg/dL (ref 0–99)
NonHDL: 98.73
Total CHOL/HDL Ratio: 3
Triglycerides: 116 mg/dL (ref 0.0–149.0)
VLDL: 23.2 mg/dL (ref 0.0–40.0)

## 2022-08-01 LAB — TSH: TSH: 1.77 u[IU]/mL (ref 0.35–5.50)

## 2022-08-01 NOTE — Patient Instructions (Signed)
Hypertension, Adult High blood pressure (hypertension) is when the force of blood pumping through the arteries is too strong. The arteries are the blood vessels that carry blood from the heart throughout the body. Hypertension forces the heart to work harder to pump blood and may cause arteries to become narrow or stiff. Untreated or uncontrolled hypertension can lead to a heart attack, heart failure, a stroke, kidney disease, and other problems. A blood pressure reading consists of a higher number over a lower number. Ideally, your blood pressure should be below 120/80. The first ("top") number is called the systolic pressure. It is a measure of the pressure in your arteries as your heart beats. The second ("bottom") number is called the diastolic pressure. It is a measure of the pressure in your arteries as the heart relaxes. What are the causes? The exact cause of this condition is not known. There are some conditions that result in high blood pressure. What increases the risk? Certain factors may make you more likely to develop high blood pressure. Some of these risk factors are under your control, including: Smoking. Not getting enough exercise or physical activity. Being overweight. Having too much fat, sugar, calories, or salt (sodium) in your diet. Drinking too much alcohol. Other risk factors include: Having a personal history of heart disease, diabetes, high cholesterol, or kidney disease. Stress. Having a family history of high blood pressure and high cholesterol. Having obstructive sleep apnea. Age. The risk increases with age. What are the signs or symptoms? High blood pressure may not cause symptoms. Very high blood pressure (hypertensive crisis) may cause: Headache. Fast or irregular heartbeats (palpitations). Shortness of breath. Nosebleed. Nausea and vomiting. Vision changes. Severe chest pain, dizziness, and seizures. How is this diagnosed? This condition is diagnosed by  measuring your blood pressure while you are seated, with your arm resting on a flat surface, your legs uncrossed, and your feet flat on the floor. The cuff of the blood pressure monitor will be placed directly against the skin of your upper arm at the level of your heart. Blood pressure should be measured at least twice using the same arm. Certain conditions can cause a difference in blood pressure between your right and left arms. If you have a high blood pressure reading during one visit or you have normal blood pressure with other risk factors, you may be asked to: Return on a different day to have your blood pressure checked again. Monitor your blood pressure at home for 1 week or longer. If you are diagnosed with hypertension, you may have other blood or imaging tests to help your health care provider understand your overall risk for other conditions. How is this treated? This condition is treated by making healthy lifestyle changes, such as eating healthy foods, exercising more, and reducing your alcohol intake. You may be referred for counseling on a healthy diet and physical activity. Your health care provider may prescribe medicine if lifestyle changes are not enough to get your blood pressure under control and if: Your systolic blood pressure is above 130. Your diastolic blood pressure is above 80. Your personal target blood pressure may vary depending on your medical conditions, your age, and other factors. Follow these instructions at home: Eating and drinking  Eat a diet that is high in fiber and potassium, and low in sodium, added sugar, and fat. An example of this eating plan is called the DASH diet. DASH stands for Dietary Approaches to Stop Hypertension. To eat this way: Eat   plenty of fresh fruits and vegetables. Try to fill one half of your plate at each meal with fruits and vegetables. Eat whole grains, such as whole-wheat pasta, brown rice, or whole-grain bread. Fill about one  fourth of your plate with whole grains. Eat or drink low-fat dairy products, such as skim milk or low-fat yogurt. Avoid fatty cuts of meat, processed or cured meats, and poultry with skin. Fill about one fourth of your plate with lean proteins, such as fish, chicken without skin, beans, eggs, or tofu. Avoid pre-made and processed foods. These tend to be higher in sodium, added sugar, and fat. Reduce your daily sodium intake. Many people with hypertension should eat less than 1,500 mg of sodium a day. Do not drink alcohol if: Your health care provider tells you not to drink. You are pregnant, may be pregnant, or are planning to become pregnant. If you drink alcohol: Limit how much you have to: 0-1 drink a day for women. 0-2 drinks a day for men. Know how much alcohol is in your drink. In the [place] one drink equals one 12 oz bottle of beer (355 mL), one 5 oz glass of wine (148 mL), or one 1 oz glass of hard liquor (44 mL). Lifestyle  Work with your health care provider to maintain a healthy body weight or to lose weight. Ask what an ideal weight is for you. Get at least 30 minutes of exercise that causes your heart to beat faster (aerobic exercise) most days of the week. Activities may include walking, swimming, or biking. Include exercise to strengthen your muscles (resistance exercise), such as Pilates or lifting weights, as part of your weekly exercise routine. Try to do these types of exercises for 30 minutes at least 3 days a week. Do not use any products that contain nicotine or tobacco. These products include cigarettes, chewing tobacco, and vaping devices, such as e-cigarettes. If you need help quitting, ask your health care provider. Monitor your blood pressure at home as told by your health care provider. Keep all follow-up visits. This is important. Medicines Take over-the-counter and prescription medicines only as told by your health care provider. Follow directions carefully. Blood  pressure medicines must be taken as prescribed. Do not skip doses of blood pressure medicine. Doing this puts you at risk for problems and can make the medicine less effective. Ask your health care provider about side effects or reactions to medicines that you should watch for. Contact a health care provider if you: Think you are having a reaction to a medicine you are taking. Have headaches that keep coming back (recurring). Feel dizzy. Have swelling in your ankles. Have trouble with your vision. Get help right away if you: Develop a severe headache or confusion. Have unusual weakness or numbness. Feel faint. Have severe pain in your chest or abdomen. Vomit repeatedly. Have trouble breathing. These symptoms may be an emergency. Get help right away. Call 911. Do not wait to see if the symptoms will go away. Do not drive yourself to the hospital. Summary Hypertension is when the force of blood pumping through your arteries is too strong. If this condition is not controlled, it may put you at risk for serious complications. Your personal target blood pressure may vary depending on your medical conditions, your age, and other factors. For most people, a normal blood pressure is less than 120/80. Hypertension is treated with lifestyle changes, medicines, or a combination of both. Lifestyle changes include losing weight, eating a healthy,   low-sodium diet, exercising more, and limiting alcohol. This information is not intended to replace advice given to you by your health care provider. Make sure you discuss any questions you have with your health care provider. Document Revised: 03/27/2021 Document Reviewed: 03/27/2021 Elsevier Patient Education  2023 [employer]  

## 2022-08-01 NOTE — Progress Notes (Signed)
Subjective:   By signing my name below, I, Terri [name], attest that this documentation has been prepared under the direction and in the presence of [name], MD., 08/01/2022.   [name], female    DOB: 12-29-1950, 72 y.o.   MRN: [id number]  Chief Complaint  Patient presents with   Follow-up    Follow up   HPI Patient is in today for an office visit. She denies CP/palpitations/SOB/HA/congestion/ fever/chills/GI or GU symptoms.  Astma She continues using her Symbicort inhaler prn to manage SOB associated with asthma. She is requesting a handicap parking pass today due to this SOB upon exertion and mobility difficulties.  Chronic Right Shoulder Pain Patient continues to follow with Dr. Amalia Hailey to manage her chronic right shoulder pain.  Weight Loss Patient remains obese and no longer sees Abby Potash, NP, at [facility]. Body mass index is 46.72 kg/m. She is interested in using injectable weight-loss medications but these are not covered by her insurance. Wt Readings from Last 3 Encounters:  08/01/22 272 lb 3.2 oz (123.5 kg)  07/02/22 274 lb (124.3 kg)  12/13/21 269 lb 1.3 oz (122.1 kg)   Lab Results  Component Value Date   HGBA1C 5.5 08/01/2022   Past Medical History:  Diagnosis Date   Allergic rhinitis    Apnea, sleep 06/28/2012   Previously used CPAP   Asthma    Atypical chest pain 01/14/2017   Back pain    Decreased visual acuity 08/05/2016   Depression 06/19/2015   Environmental and seasonal allergies    GERD (gastroesophageal reflux disease)    Heartburn    Hip pain, right 06/28/2012   HTN (hypertension)    Hypokalemia 01/14/2017   Leg pain    Morbid obesity ([place]) 04/11/2008   Qualifier: Diagnosis of  By: Wynona Luna    Numbness in feet 05/06/2016   Obesity    Osteoarthritis 01/16/2010   Qualifier: Diagnosis of  By: Wynona Luna    Preventative health care 06/25/2015   Shortness of breath     Shortness of breath on exertion    Sleep apnea    Swelling of extremity    Tinnitus    Vertigo     Past Surgical History:  Procedure Laterality Date   BIOPSY BREAST  1997   left    childbirth     COLONOSCOPY  2004   UPPER GASTROINTESTINAL ENDOSCOPY  03/01/2019    Family History  Problem Relation Age of Onset   Stroke Mother    Liver disease Mother        hepatitis b   Hypertension Mother    Cancer Father        esophagus, prostate   Hypertension Father    Heart disease Father    Esophageal cancer Father    Liver disease Brother        hep c   Mental illness Brother        PTSD from [place]   Stroke Maternal Grandmother    Cancer Paternal Grandfather        prostate   Colitis Neg Hx    Colon cancer Neg Hx    Rectal cancer Neg Hx    Stomach cancer Neg Hx     Social History   Socioeconomic History   Marital status: Divorced    Spouse name: Not on file   Number of children: 1   Years of education: Not on file  Highest education level: Not on file  Occupational History   Occupation: HS music teacher- [employer]  Tobacco Use   Smoking status: Never   Smokeless tobacco: Never  Vaping Use   Vaping Use: Never used  Substance and Sexual Activity   Alcohol use: Yes    Comment: social   Drug use: No   Sexual activity: Not on file    Comment: band teacher, 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 no dietary restrictions  Other Topics Concern   Not on file  Social History Narrative   Single/divorced   1 son born 50 - sometimes there w/ her, leukemia 2016   Music teacher [employer]   Caffeine use 1 cup AM   Never smoker/tobacco   No drugs   Rare-occ EtOHSmoking    Social Determinants of Health   Financial Resource Strain: Low Risk  (06/12/2021)   Overall Financial Resource Strain (CARDIA)    Difficulty of Paying Living Expenses: Not hard at all  Food Insecurity: No Food Insecurity (06/12/2021)   Hunger Vital Sign    Worried About Running Out of Food in the  Last Year: Never true    Ran Out of Food in the Last Year: Never true  Transportation Needs: No Transportation Needs (06/12/2021)   PRAPARE - Hydrologist (Medical): No    Lack of Transportation (Non-Medical): No  Physical Activity: Inactive (06/12/2021)   Exercise Vital Sign    Days of Exercise per Week: 0 days    Minutes of Exercise per Session: 0 min  Stress: No Stress Concern Present (06/12/2021)   [place]    Feeling of Stress : Not at all  Social Connections: Moderately Integrated (06/12/2021)   Social Connection and Isolation Panel [NHANES]    Frequency of Communication with Friends and Family: More than three times a week    Frequency of Social Gatherings with Friends and Family: More than three times a week    Attends Religious Services: More than 4 times per year    Active Member of [employer] or Organizations: Yes    Attends Music therapist: More than 4 times per year    Marital Status: Divorced  Intimate Partner Violence: Not At Risk (06/12/2021)   Humiliation, Afraid, Rape, and Kick questionnaire    Fear of Current or Ex-Partner: No    Emotionally Abused: No    Physically Abused: No    Sexually Abused: No    Outpatient Medications Prior to Visit  Medication Sig Dispense Refill   albuterol (VENTOLIN HFA) 108 (90 Base) MCG/ACT inhaler INHALE 1-2 PUFFS INTO THE LUNGS EVERY 6 (SIX) HOURS AS NEEDED FOR WHEEZING OR SHORTNESS OF BREATH. (Patient taking differently: Inhale 2 puffs into the lungs every 6 (six) hours as needed for wheezing or shortness of breath.) 18 each 5   amLODipine-olmesartan (AZOR) 10-40 MG tablet TAKE 1 TABLET BY MOUTH EVERY DAY 90 tablet 1   dexlansoprazole (DEXILANT) 60 MG capsule TAKE 1 CAPSULE (60 MG TOTAL) BY MOUTH DAILY BEFORE BREAKFAST. 90 capsule 0   EPINEPHrine 0.3 mg/0.3 mL IJ SOAJ injection Inject 1 Dose into the muscle as needed for allergies.  Inject after allergy shot for anaphylaxis     famotidine (PEPCID) 40 MG tablet TAKE 1 TABLET BY MOUTH EVERY DAY 90 tablet 3   levocetirizine (XYZAL) 5 MG tablet Take 5 mg by mouth every evening.     montelukast (SINGULAIR) 10 MG tablet Take  1 tablet (10 mg total) by mouth at bedtime. 90 tablet 1   oxybutynin (DITROPAN-XL) 10 MG 24 hr tablet Take 10 mg by mouth daily.      No facility-administered medications prior to visit.    Allergies  Allergen Reactions   Sulfamethoxazole-Trimethoprim     Other reaction(s): Unknown   Sulfonamide Derivatives    Sulfa Antibiotics Rash    Review of Systems  Constitutional:  Negative for chills and fever.  HENT:  Negative for congestion.   Respiratory:  Negative for shortness of breath.   Cardiovascular:  Negative for chest pain and palpitations.  Gastrointestinal:  Negative for abdominal pain, blood in stool, constipation, diarrhea, nausea and vomiting.  Genitourinary:  Negative for dysuria, frequency, hematuria and urgency.  Skin:           Neurological:  Negative for headaches.       Objective:    Physical Exam Constitutional:      General: She is not in acute distress.    Appearance: Normal appearance. She is obese. She is not ill-appearing.  HENT:     Head: Normocephalic and atraumatic.     Right Ear: External ear normal.     Left Ear: External ear normal.     Nose: Nose normal.     Mouth/Throat:     Mouth: Mucous membranes are moist.     Pharynx: Oropharynx is clear.  Eyes:     General:        Right eye: No discharge.        Left eye: No discharge.     Extraocular Movements: Extraocular movements intact.     Conjunctiva/sclera: Conjunctivae normal.     Pupils: Pupils are equal, round, and reactive to light.  Cardiovascular:     Rate and Rhythm: Normal rate and regular rhythm.     Pulses: Normal pulses.     Heart sounds: Normal heart sounds. No murmur heard.    No gallop.  Pulmonary:     Effort: Pulmonary effort is normal.  No respiratory distress.     Breath sounds: Normal breath sounds. No wheezing or rales.  Abdominal:     General: Bowel sounds are normal.     Palpations: Abdomen is soft.     Tenderness: There is no abdominal tenderness. There is no guarding.  Musculoskeletal:        General: Normal range of motion.     Cervical back: Normal range of motion.     Right lower leg: No edema.     Left lower leg: No edema.  Skin:    General: Skin is warm and dry.  Neurological:     Mental Status: She is alert and oriented to person, place, and time.  Psychiatric:        Mood and Affect: Mood normal.        Behavior: Behavior normal.        Judgment: Judgment normal.     BP 128/78 (BP Location: Right Arm, Patient Position: Sitting, Cuff Size: Large)   Pulse 73   Temp (!) 97.5 F (36.4 C) (Oral)   Resp 16   Ht '5\' 4"'$  (1.626 m)   Wt 272 lb 3.2 oz (123.5 kg)   SpO2 97%   BMI 46.72 kg/m  Wt Readings from Last 3 Encounters:  08/01/22 272 lb 3.2 oz (123.5 kg)  07/02/22 274 lb (124.3 kg)  12/13/21 269 lb 1.3 oz (122.1 kg)    Diabetic Foot Exam - Simple  No data filed    Lab Results  Component Value Date   WBC 9.8 08/01/2022   HGB 12.2 08/01/2022   HCT 38.3 08/01/2022   PLT 408.0 (H) 08/01/2022   GLUCOSE 93 08/01/2022   CHOL 143 08/01/2022   TRIG 116.0 08/01/2022   HDL 44.50 08/01/2022   LDLCALC 76 08/01/2022   ALT 10 08/01/2022   AST 11 08/01/2022   NA 142 08/01/2022   K 4.1 08/01/2022   CL 105 08/01/2022   CREATININE 0.96 08/01/2022   BUN 15 08/01/2022   CO2 30 08/01/2022   TSH 1.77 08/01/2022   HGBA1C 5.5 08/01/2022    Lab Results  Component Value Date   TSH 1.77 08/01/2022   Lab Results  Component Value Date   WBC 9.8 08/01/2022   HGB 12.2 08/01/2022   HCT 38.3 08/01/2022   MCV 93.5 08/01/2022   PLT 408.0 (H) 08/01/2022   Lab Results  Component Value Date   NA 142 08/01/2022   K 4.1 08/01/2022   CO2 30 08/01/2022   GLUCOSE 93 08/01/2022   BUN 15 08/01/2022    CREATININE 0.96 08/01/2022   BILITOT 0.6 08/01/2022   ALKPHOS 77 08/01/2022   AST 11 08/01/2022   ALT 10 08/01/2022   PROT 6.9 08/01/2022   ALBUMIN 3.8 08/01/2022   CALCIUM 9.6 08/01/2022   ANIONGAP 7 03/19/2019   EGFR 59 (L) 03/07/2021   GFR 60.24 08/01/2022   Lab Results  Component Value Date   CHOL 143 08/01/2022   Lab Results  Component Value Date   HDL 44.50 08/01/2022   Lab Results  Component Value Date   LDLCALC 76 08/01/2022   Lab Results  Component Value Date   TRIG 116.0 08/01/2022   Lab Results  Component Value Date   CHOLHDL 3 08/01/2022   Lab Results  Component Value Date   HGBA1C 5.5 08/01/2022      Assessment & Plan:  Asthma: Patient provided with handicap pass today due to mobility difficulties and SOB upon exertion.  Healthy Lifestyle: Encouraged adequate sleep, heart healthy diet, hydration, and minimum of 4000 daily steps.  Hypertension: Well-controlled with Amlodipine-Olmesartan 10-40 mg.  Immunizations: Reviewed immunization history and encouraged COVID, Flu, Shingles, and Tetanus vaccinations. Patient refuses all vaccinations except Shingles.  Weight Loss: Referral placed to [facility].  Problem List Items Addressed This Visit     Anemia   Relevant Orders   Iron, TIBC and Ferritin Panel (Completed)   Asthma - Primary    No recent exacerbation      Back pain    Encouraged moist heat and gentle stretching as tolerated. May try NSAIDs and prescription meds as directed and report if symptoms worsen or seek immediate care       Essential hypertension    Well controlled, no changes to meds. Encouraged heart healthy diet such as the DASH diet and exercise as tolerated.       Relevant Orders   CBC with Differential/Platelet (Completed)   Comprehensive metabolic panel (Completed)   TSH (Completed)   Hyperglycemia    hgba1c acceptable, minimize simple carbs. Increase exercise as tolerated.      Relevant  Orders   Hemoglobin A1c (Completed)   Hyperlipidemia, mixed    Encourage heart healthy diet such as MIND or DASH diet, increase exercise, avoid trans fats, simple carbohydrates and processed foods, consider a krill or fish or flaxseed oil cap daily.       Relevant Orders  Lipid panel (Completed)   Morbid obesity ([place])    Encouraged DASH or MIND diet, decrease po intake and increase exercise as tolerated. Needs 7-8 hours of sleep nightly. Avoid trans fats, eat small, frequent meals every 4-5 hours with lean proteins, complex carbs and healthy fats. Minimize simple carbs, high fat foods and processed foods referred back to healthy weight and wellness      Relevant Orders   Amb Ref to Medical Weight Management   Shoulder pain, right    Working with PT and sports med and is improving.      Vitamin D deficiency    Supplement and monitor       No orders of the defined types were placed in this encounter.  I, Penni Homans, MD, personally preformed the services described in this documentation.  All medical record entries made by the scribe were at my direction and in my presence.  I have reviewed the chart and discharge instructions (if applicable) and agree that the record reflects my personal performance and is accurate and complete. 08/01/2022  I,Mohammed Iqbal,acting as a scribe for Penni Homans, MD.,have documented all relevant documentation on the behalf of Penni Homans, MD,as directed by  Penni Homans, MD while in the presence of Penni Homans, MD.  Penni Homans, MD
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
## 2022-08-02 LAB — IRON,TIBC AND FERRITIN PANEL
%SAT: 25 % (calc) (ref 16–45)
Ferritin: 9 ng/mL — ABNORMAL LOW (ref 16–288)
Iron: 85 ug/dL (ref 45–160)
TIBC: 339 mcg/dL (calc) (ref 250–450)

## 2022-08-02 LAB — HEMOGLOBIN A1C: Hgb A1c MFr Bld: 5.5 % (ref 4.6–6.5)

## 2022-08-04 DIAGNOSIS — M25511 Pain in right shoulder: Secondary | ICD-10-CM | POA: Insufficient documentation

## 2022-08-04 NOTE — Assessment & Plan Note (Signed)
Working with PT and sports med and is improving.

## 2022-08-04 NOTE — Assessment & Plan Note (Signed)
Encouraged DASH or MIND diet, decrease po intake and increase exercise as tolerated. Needs 7-8 hours of sleep nightly. Avoid trans fats, eat small, frequent meals every 4-5 hours with lean proteins, complex carbs and healthy fats. Minimize simple carbs, high fat foods and processed foods referred back to healthy weight and wellness

## 2022-08-04 NOTE — Assessment & Plan Note (Signed)
Encouraged moist heat and gentle stretching as tolerated. May try NSAIDs and prescription meds as directed and report if symptoms worsen or seek immediate care 

## 2022-08-05 ENCOUNTER — Other Ambulatory Visit: Payer: Self-pay | Admitting: Obstetrics & Gynecology

## 2022-08-05 DIAGNOSIS — R928 Other abnormal and inconclusive findings on diagnostic imaging of breast: Secondary | ICD-10-CM

## 2022-08-06 ENCOUNTER — Ambulatory Visit: Payer: Medicare PPO | Attending: Family Medicine

## 2022-08-06 DIAGNOSIS — R600 Localized edema: Secondary | ICD-10-CM | POA: Diagnosis not present

## 2022-08-06 DIAGNOSIS — M6281 Muscle weakness (generalized): Secondary | ICD-10-CM | POA: Insufficient documentation

## 2022-08-06 DIAGNOSIS — M25511 Pain in right shoulder: Secondary | ICD-10-CM | POA: Insufficient documentation
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
## 2022-08-08 ENCOUNTER — Ambulatory Visit: Payer: Medicare PPO

## 2022-08-10 ENCOUNTER — Ambulatory Visit
Admission: RE | Admit: 2022-08-10 | Discharge: 2022-08-10 | Disposition: A | Discharge status: Home / Self Care | Payer: Medicare PPO | Source: Ambulatory Visit | Attending: Obstetrics & Gynecology | Admitting: Obstetrics & Gynecology

## 2022-08-10 ENCOUNTER — Other Ambulatory Visit: Payer: Self-pay | Admitting: Family Medicine

## 2022-08-10 DIAGNOSIS — J309 Allergic rhinitis, unspecified: Secondary | ICD-10-CM

## 2022-08-10 DIAGNOSIS — R059 Cough, unspecified: Secondary | ICD-10-CM

## 2022-08-10 DIAGNOSIS — R928 Other abnormal and inconclusive findings on diagnostic imaging of breast: Secondary | ICD-10-CM

## 2022-08-12 NOTE — Progress Notes (Unsigned)
Terri Goltz, PhD, LAT, ATC acting as a scribe for Terri Leader, MD.  CHAMIA Terri [name] is a 72 y.o. female who presents to [facility] today for follow-up chronic right shoulder pain.  Patient was last seen by Dr. Georgina [name] on 07/02/2022 and was referred to PT, completing 5 visits.  Today, patient reports***  Dx imaging: 07/02/2022 R shoulder x-ray 12/22/17 C-spine XR   Pertinent review of systems: ***  Relevant historical information: ***   Exam:  There were no vitals taken for this visit. General: Well Developed, well nourished, and in no acute distress.   MSK: ***    Lab and Radiology Results No results found for this or any previous visit (from the past 72 hour(s)). MM DIAG BREAST TOMO UNI RIGHT  Result Date: 08/10/2022 CLINICAL DATA:  Screening recall for possible right breast masses. EXAM: DIGITAL DIAGNOSTIC UNILATERAL RIGHT MAMMOGRAM WITH TOMOSYNTHESIS; ULTRASOUND RIGHT BREAST LIMITED TECHNIQUE: Right digital diagnostic mammography and breast tomosynthesis was performed.; Targeted ultrasound examination of the right breast was performed COMPARISON:  Previous exam(s). ACR Breast Density Category b: There are scattered areas of fibroglandular density. FINDINGS: Additional tomograms were performed of the right breast. There is an oval mass in the slightly outer right breast measuring 0.5 cm. The additional initially questioned possible mass in the far outer right breast is less apparent, demonstrating imaging features suggestive of fibroglandular tissue. Targeted ultrasound the entire outer right breast was performed there is a cyst in the right breast at 9 o'clock 4 cm from nipple measuring 0.4 x 0.3 x 0.3 cm. This is felt to correspond well with the mass seen in the slightly outer right breast at mammography. The far outer right breast was scanned as well with no suspicious masses or any other worrisome abnormality seen. IMPRESSION: No findings of  malignancy in the right breast. RECOMMENDATION: Screening mammogram in one year.(Code:SM-B-01Y) I have discussed the findings and recommendations with the patient. If applicable, a reminder letter will be sent to the patient regarding the next appointment. BI-RADS CATEGORY  2: Benign. Electronically Signed   By: Everlean Alstrom M.D.   On: 08/10/2022 12:03  US BREAST LTD UNI RIGHT INC AXILLA  Result Date: 08/10/2022 CLINICAL DATA:  Screening recall for possible right breast masses. EXAM: DIGITAL DIAGNOSTIC UNILATERAL RIGHT MAMMOGRAM WITH TOMOSYNTHESIS; ULTRASOUND RIGHT BREAST LIMITED TECHNIQUE: Right digital diagnostic mammography and breast tomosynthesis was performed.; Targeted ultrasound examination of the right breast was performed COMPARISON:  Previous exam(s). ACR Breast Density Category b: There are scattered areas of fibroglandular density. FINDINGS: Additional tomograms were performed of the right breast. There is an oval mass in the slightly outer right breast measuring 0.5 cm. The additional initially questioned possible mass in the far outer right breast is less apparent, demonstrating imaging features suggestive of fibroglandular tissue. Targeted ultrasound the entire outer right breast was performed there is a cyst in the right breast at 9 o'clock 4 cm from nipple measuring 0.4 x 0.3 x 0.3 cm. This is felt to correspond well with the mass seen in the slightly outer right breast at mammography. The far outer right breast was scanned as well with no suspicious masses or any other worrisome abnormality seen. IMPRESSION: No findings of malignancy in the right breast. RECOMMENDATION: Screening mammogram in one year.(Code:SM-B-01Y) I have discussed the findings and recommendations with the patient. If applicable, a reminder letter will be sent to the patient regarding the next appointment. BI-RADS CATEGORY  2: Benign. Electronically Signed  By: Everlean Alstrom M.D.   On: 08/10/2022 12:03       Assessment and Plan: 72 y.o. female with ***   PDMP not reviewed this encounter. No orders of the defined types were placed in this encounter.  No orders of the defined types were placed in this encounter.    Discussed warning signs or symptoms. Please see discharge instructions. Patient expresses understanding.   ***

## 2022-08-13 ENCOUNTER — Ambulatory Visit: Payer: Self-pay

## 2022-08-13 ENCOUNTER — Ambulatory Visit: Payer: Medicare PPO | Admitting: Family Medicine

## 2022-08-13 ENCOUNTER — Ambulatory Visit: Payer: Medicare PPO

## 2022-08-13 VITALS — BP 118/74 | HR 75 | Ht 64.0 in | Wt 271.0 lb

## 2022-08-13 DIAGNOSIS — R600 Localized edema: Secondary | ICD-10-CM | POA: Diagnosis not present

## 2022-08-13 DIAGNOSIS — M19011 Primary osteoarthritis, right shoulder: Secondary | ICD-10-CM | POA: Diagnosis not present

## 2022-08-13 DIAGNOSIS — M25511 Pain in right shoulder: Secondary | ICD-10-CM

## 2022-08-13 DIAGNOSIS — G8929 Other chronic pain: Secondary | ICD-10-CM

## 2022-08-13 DIAGNOSIS — M6281 Muscle weakness (generalized): Secondary | ICD-10-CM

## 2022-08-13 NOTE — Progress Notes (Signed)
Terri Goltz, PhD, LAT, ATC acting as a scribe for Terri Leader, MD.  Terri [name] is a 72 y.o. female who presents to [place] [facility] today for follow-up chronic right shoulder pain.  Patient was last seen by Dr. Georgina Snell on 07/02/2022 and was referred to PT, completing 5 visits.  Today, patient reports R shoulder has improved some, 50% better. Pt notes different PT exercises that cause pain. Pt c/o increased pain when trying to sleep on her R side.  She has difficulty with certain motions such as reaching overhead and reaching behind her secondary to pain.    Dx imaging: 07/02/2022 R shoulder x-ray 12/22/17 C-spine XR   Pertinent review of systems: as per HPI. No fever or chills  Relevant historical information: History of right lateral epicondylitis successfully treated with home exercise program 22.    Exam:  BP 118/74   Pulse 75   Ht '5\' 4"'$  (1.626 m)   Wt 271 lb (122.9 kg)   SpO2 96%   BMI 46.52 kg/m  General: Well Developed, well nourished, and in no acute distress.   MSK: Right shoulder: No obvious swelling or deformity.   There is tenderness to palpation along the anterior and posterior aspect of the glenohumeral joint as well as tenderness along the scapula.   Significantly limited ROM with abduction (approximately 90 degrees), flexion (approximately 70 degrees) secondary to pain.  Limited internal rotation secondary to pain.   No weakness with empty can test but does elicit pain.   Unable to cooperate for Neer test.    Lab and Radiology Results  Procedure: Real-time Ultrasound Guided Injection of right shoulder glenohumeral joint posterior approach Device: Philips Affiniti 50G Images permanently stored and available for review in PACS Verbal informed consent obtained.  Discussed risks and benefits of procedure. Warned about infection, bleeding, hyperglycemia damage to structures among others. Patient expresses understanding and  agreement Time-out conducted.   Noted no overlying erythema, induration, or other signs of local infection.   Skin prepped in a sterile fashion.   Local anesthesia: Topical Ethyl chloride.   With sterile technique and under real time ultrasound guidance: 40 mg of Kenalog and 2 mL Marcaine injected into glenohumeral joint. Fluid seen entering the joint capsule.   Completed without difficulty   Pain moderately resolved suggesting accurate placement of the medication.   Advised to call if fevers/chills, erythema, induration, drainage, or persistent bleeding.   Images permanently stored and available for review in the ultrasound unit.  Impression: Technically successful ultrasound guided injection.      XR right shoulder 07/02/2022 FINDINGS: Glenohumeral degenerative changes are identified with osteophytes off the medial humeral head and glenoid. No fracture or dislocation. No other significant bony or soft tissue abnormalities identified. I, Terri [name], personally (independently) visualized and performed the interpretation of the images attached in this note.   Assessment and Plan: 72 y.o. female with chronic right shoulder pain.  Modest improvement with PT but still having significant pain and limitations in activity.  Symptoms could be secondary to subdeltoid bursitis (seen on ultrasound last visit), OA (noted on recent XR), rotator cuff tendinopathy, impingement.  OA could be playing a significant role, corticosteroid injection performed today in the glenohumeral joint.  If no significant improvement in pain in 1 week, patient will let me know and we can try a subacromial corticosteroid injection. Ultimately if not better she may benefit from surgical consultation to discuss total shoulder replacement.   PDMP not  reviewed this encounter. Orders Placed This Encounter  Procedures   [name] LIMITED JOINT SPACE STRUCTURES UP RIGHT(NO LINKED CHARGES)    Order Specific Question:   Reason for  Exam (SYMPTOM  OR DIAGNOSIS REQUIRED)    Answer:   right shoulder pain    Order Specific Question:   Preferred imaging location?    Answer:   [facility]    No orders of the defined types were placed in this encounter.    Discussed warning signs or symptoms. Please see discharge instructions. Patient expresses understanding.   The above documentation has been reviewed and is accurate and complete Terri [name], M.D.
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
## 2022-08-13 NOTE — Patient Instructions (Addendum)
Thank you for coming in today.   You received an injection today. Seek immediate medical attention if the joint becomes red, extremely painful, or is oozing fluid.   Keep me updated.   If this shot does not help we can inject the other part of the shoulder.

## 2022-08-14 ENCOUNTER — Encounter: Payer: Self-pay | Admitting: Family Medicine

## 2022-08-15 ENCOUNTER — Ambulatory Visit: Payer: Medicare PPO

## 2022-08-15 DIAGNOSIS — M25511 Pain in right shoulder: Secondary | ICD-10-CM

## 2022-08-15 DIAGNOSIS — M6281 Muscle weakness (generalized): Secondary | ICD-10-CM

## 2022-08-15 DIAGNOSIS — R600 Localized edema: Secondary | ICD-10-CM | POA: Diagnosis not present
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
## 2022-08-21 ENCOUNTER — Ambulatory Visit: Payer: Medicare PPO

## 2022-08-21 DIAGNOSIS — M25511 Pain in right shoulder: Secondary | ICD-10-CM

## 2022-08-21 DIAGNOSIS — M6281 Muscle weakness (generalized): Secondary | ICD-10-CM | POA: Diagnosis not present

## 2022-08-21 DIAGNOSIS — R600 Localized edema: Secondary | ICD-10-CM
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
## 2022-08-27 ENCOUNTER — Other Ambulatory Visit: Payer: Self-pay | Admitting: Internal Medicine

## 2022-08-27 ENCOUNTER — Ambulatory Visit: Payer: Medicare PPO

## 2022-08-27 DIAGNOSIS — M25511 Pain in right shoulder: Secondary | ICD-10-CM

## 2022-08-27 DIAGNOSIS — M6281 Muscle weakness (generalized): Secondary | ICD-10-CM

## 2022-08-27 DIAGNOSIS — R600 Localized edema: Secondary | ICD-10-CM
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
## 2022-08-29 IMAGING — MR MR ABDOMEN WO/W CM
9 of 17 series · 25 of 48 positions shown · IV contrast (GADAVIST)
Comparison: CT March 14, 2021

CLINICAL DATA: Further characterization of incidental finding in
the liver seen on prior cardiac chest CT

EXAM:
MRI ABDOMEN WITHOUT AND WITH CONTRAST
TECHNIQUE: Multiplanar multisequence MR imaging of the abdomen was performed
both before and after the administration of intravenous contrast.
CONTRAST:  10mL GADAVIST GADOBUTROL 1 MMOL/ML IV SOLN

[Series 3: T2 · coronal · 7.0mm · 1.41mm/px · 1 of 32 slices shown (1 of 2)]
[im 1/32]
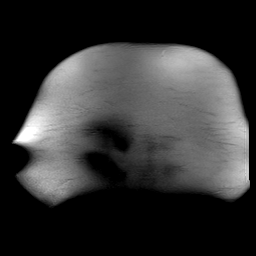

[Series 4: T2 fat-sat · axial · 6.0mm · 1.41mm/px · z∈[-177,+85]mm · 2 of 36 slices shown]
[im 1/36]
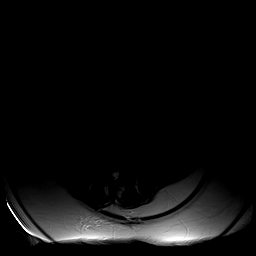
[im 36/36]
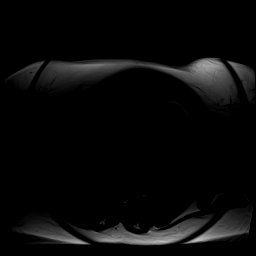

[Series 5: in + out · axial · 6.0mm · 0.70mm/px · z∈[-179,+69]mm · 4 of 68 slices shown]
[im 1/68]
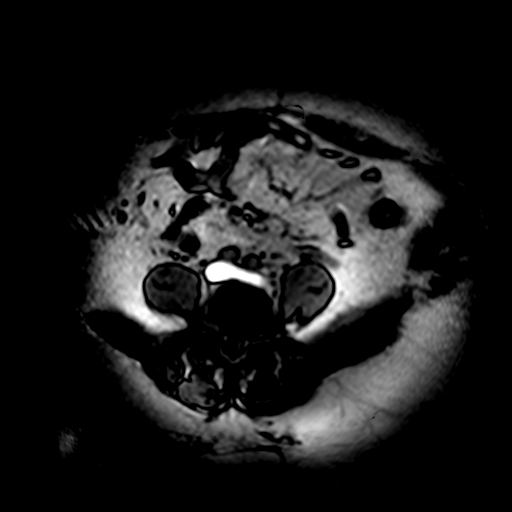
[im 23/68]
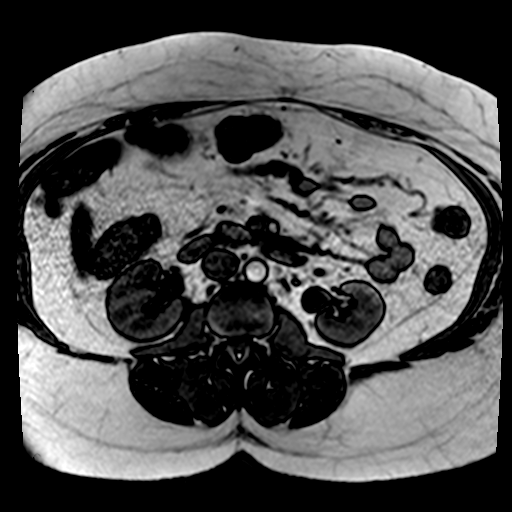
[im 45/68]
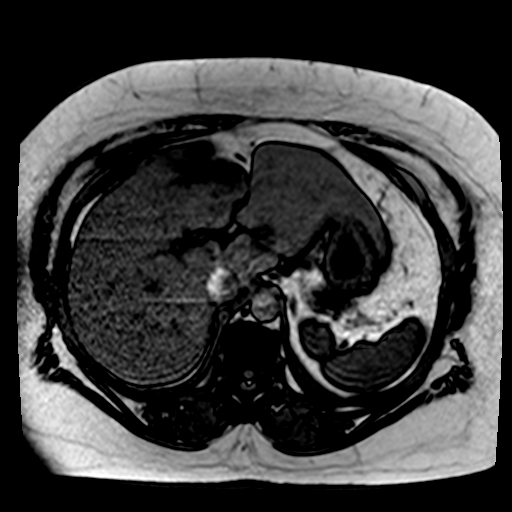
[im 68/68]
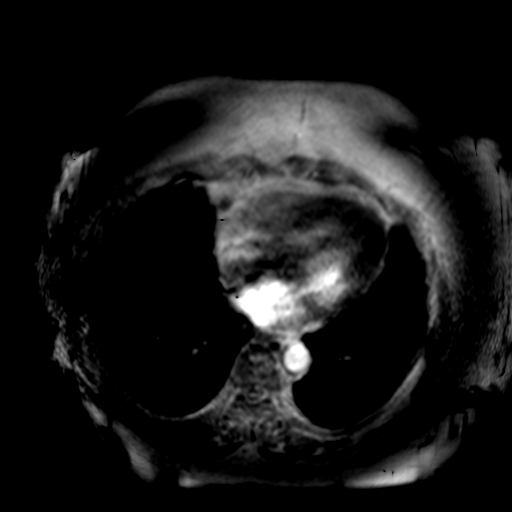

[Series 6: [id number] free breathing · axial · 6.0mm · 1.88mm/px · z∈[-177,+85]mm · 6 of 108 slices shown]
[im 1/108]
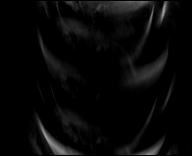
[im 22/108]
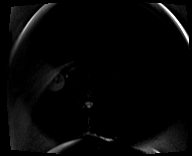
[im 43/108]
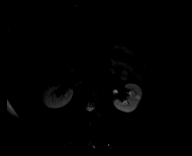
[im 65/108]
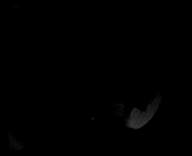
[im 86/108]
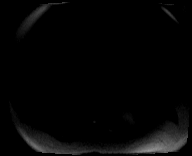
[im 108/108]
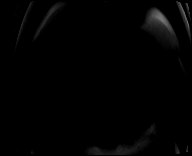

[Series 7: ep2d_[id number] free breathing_adc · axial · 6.0mm · 1.88mm/px · z∈[-177,+85]mm · 2 of 36 slices shown]
[im 1/36]
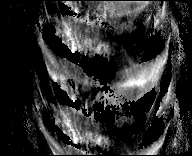
[im 36/36]
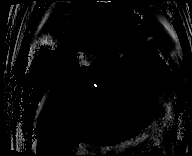

[Series 8: DWI · axial · 6.0mm · 0.68mm/px · z∈[-179,+69]mm · 2 of 34 slices shown]
[im 1/34]
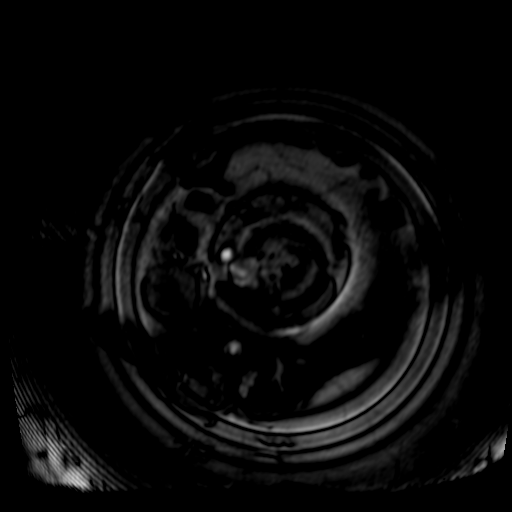
[im 34/34]
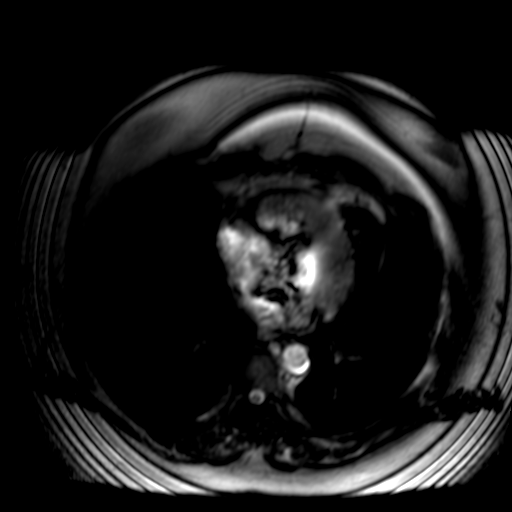

[Series 17: T2 · axial · 6.0mm · 1.33mm/px · z∈[-179,+69]mm · 2 of 34 slices shown (2 of 2)]
[im 1/34]
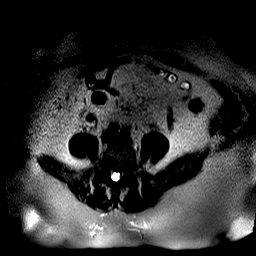
[im 34/34]
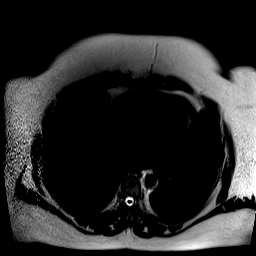

[Series 18: T1 dynamic fat-sat post-contrast · axial · delayed · 5.0mm · 1.25mm/px · z∈[-178,+77]mm · 3 of 52 slices shown (1 of 2)]
[im 1/52]
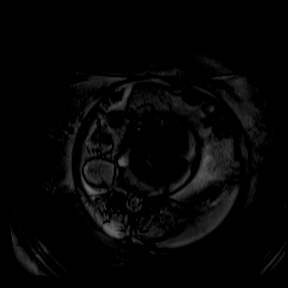
[im 26/52]
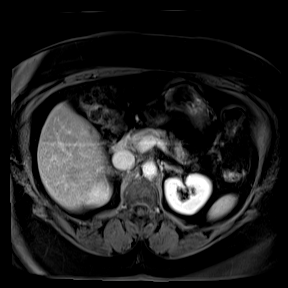
[im 52/52]
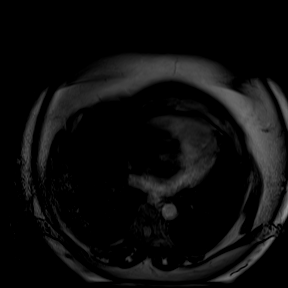

[Series 19: T1 dynamic fat-sat post-contrast · axial · 5.0mm · 1.25mm/px · z∈[-178,+77]mm · 3 of 52 slices shown (2 of 2)]
[im 1/52]
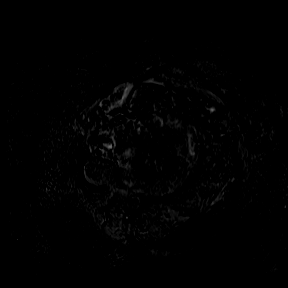
[im 26/52]
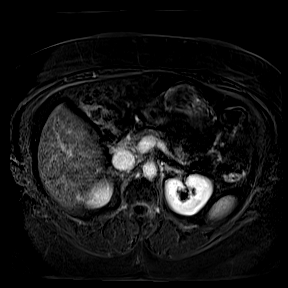
[im 52/52]
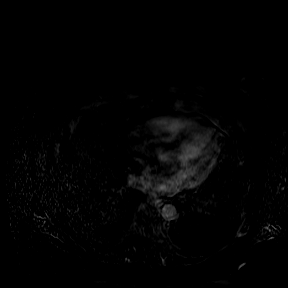

[25 of 48 positions shown; findings below may reference images not displayed]

FINDINGS: Lower chest: No acute abnormality.

Hepatobiliary: Diffuse hepatic steatosis.

Irregularly-shaped the well-circumscribed T2 hyperintense lesion in
segment VIII on image [DATE] and [DATE] which demonstrates low intrinsic
T1 signal without internal postcontrast enhancement consistent with
a hepatic cyst.

Segment VI 10 mm focus of enhancement on image [DATE] which increases
in intensity on early delayed imaging and persists throughout all
phases of delayed imaging, demonstrating mildly T2 hyperintense and
T1 hypointense intrinsic signal, imaging characteristics which are
nonspecific but are consistent with a benign etiology such as an
intrahepatic shunt or less likely benign hepatic hemangioma.

Gallbladder is unremarkable.  No biliary ductal dilation.

Pancreas: No pancreatic ductal dilation or evidence of acute
inflammation. Intrinsic T1 signal a pancreatic parenchyma is within
normal limits. No suspicious pancreatic mass.

Spleen:  Within normal limits.

Adrenals/Urinary Tract: Bilateral adrenal glands are unremarkable.
No hydronephrosis. Bilateral benign Bosniak classification 1 and 2
renal cysts. No solid enhancing renal mass.

Stomach/Bowel: Visualized portions within the abdomen are
unremarkable.

Vascular/Lymphatic: No pathologically enlarged lymph nodes
identified. No abdominal aortic aneurysm demonstrated.

Other:  No abdominal ascites.

Musculoskeletal: No suspicious bone lesions identified.
IMPRESSION: 1. Irregularly-shaped T2 hyperintense lesion in segment VIII, which
corresponds with the lesion seen on prior CT, with imaging
characteristics most consistent with a benign hepatic cyst.
2. Segment VI 10 mm focus of enhancement with imaging
characteristics most consistent with a benign etiology such as an
intrahepatic shunt or less likely benign hepatic hemangioma.
3. Diffuse hepatic steatosis.
4. Bilateral benign Bosniak classification 1 and 2 renal cysts.

## 2022-09-03 ENCOUNTER — Ambulatory Visit: Payer: Medicare PPO | Attending: Family Medicine

## 2022-09-03 DIAGNOSIS — R600 Localized edema: Secondary | ICD-10-CM | POA: Diagnosis not present

## 2022-09-03 DIAGNOSIS — M25511 Pain in right shoulder: Secondary | ICD-10-CM | POA: Insufficient documentation

## 2022-09-03 DIAGNOSIS — M6281 Muscle weakness (generalized): Secondary | ICD-10-CM | POA: Insufficient documentation
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
## 2022-09-05 ENCOUNTER — Ambulatory Visit: Payer: Medicare PPO

## 2022-09-05 DIAGNOSIS — M6281 Muscle weakness (generalized): Secondary | ICD-10-CM | POA: Diagnosis not present

## 2022-09-05 DIAGNOSIS — R600 Localized edema: Secondary | ICD-10-CM | POA: Diagnosis not present

## 2022-09-05 DIAGNOSIS — M25511 Pain in right shoulder: Secondary | ICD-10-CM
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
## 2022-09-11 ENCOUNTER — Ambulatory Visit: Payer: Medicare PPO

## 2022-09-11 DIAGNOSIS — M25511 Pain in right shoulder: Secondary | ICD-10-CM

## 2022-09-11 DIAGNOSIS — R600 Localized edema: Secondary | ICD-10-CM

## 2022-09-11 DIAGNOSIS — M6281 Muscle weakness (generalized): Secondary | ICD-10-CM | POA: Diagnosis not present
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
## 2022-09-17 ENCOUNTER — Ambulatory Visit: Payer: Medicare PPO

## 2022-09-17 DIAGNOSIS — M25511 Pain in right shoulder: Secondary | ICD-10-CM

## 2022-09-17 DIAGNOSIS — R600 Localized edema: Secondary | ICD-10-CM | POA: Diagnosis not present

## 2022-09-17 DIAGNOSIS — M6281 Muscle weakness (generalized): Secondary | ICD-10-CM
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
## 2022-09-19 ENCOUNTER — Ambulatory Visit: Payer: Medicare PPO

## 2022-09-19 DIAGNOSIS — M6281 Muscle weakness (generalized): Secondary | ICD-10-CM | POA: Diagnosis not present

## 2022-09-19 DIAGNOSIS — R600 Localized edema: Secondary | ICD-10-CM | POA: Diagnosis not present

## 2022-09-19 DIAGNOSIS — M25511 Pain in right shoulder: Secondary | ICD-10-CM | POA: Diagnosis not present
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
## 2022-09-23 DIAGNOSIS — L650 Telogen effluvium: Secondary | ICD-10-CM | POA: Diagnosis not present

## 2022-09-23 DIAGNOSIS — L658 Other specified nonscarring hair loss: Secondary | ICD-10-CM | POA: Diagnosis not present

## 2022-11-04 NOTE — Assessment & Plan Note (Signed)
Encourage heart healthy diet such as MIND or DASH diet, increase exercise, avoid trans fats, simple carbohydrates and processed foods, consider a krill or fish or flaxseed oil cap daily.  °

## 2022-11-04 NOTE — Assessment & Plan Note (Signed)
hgba1c acceptable, minimize simple carbs. Increase exercise as tolerated.  

## 2022-11-04 NOTE — Assessment & Plan Note (Signed)
Hydrate and monitor 

## 2022-11-04 NOTE — Assessment & Plan Note (Signed)
Well controlled, no changes to meds. Encouraged heart healthy diet such as the DASH diet and exercise as tolerated.  °

## 2022-11-04 NOTE — Assessment & Plan Note (Signed)
Supplement and monitor 

## 2022-11-04 NOTE — Assessment & Plan Note (Signed)
Encouraged DASH or MIND diet, decrease po intake and increase exercise as tolerated. Needs 7-8 hours of sleep nightly. Avoid trans fats, eat small, frequent meals every 4-5 hours with lean proteins, complex carbs and healthy fats. Minimize simple carbs, high fat foods and processed foods. She is given a prescription for Wegovy 0.25 mg because she has a family friend who she believes can help her get it paid for

## 2022-11-05 ENCOUNTER — Ambulatory Visit: Payer: Medicare PPO | Admitting: Family Medicine

## 2022-11-05 VITALS — BP 116/70 | HR 74 | Temp 97.8°F | Resp 16 | Ht 64.0 in | Wt 276.8 lb

## 2022-11-05 DIAGNOSIS — E782 Mixed hyperlipidemia: Secondary | ICD-10-CM | POA: Diagnosis not present

## 2022-11-05 DIAGNOSIS — E559 Vitamin D deficiency, unspecified: Secondary | ICD-10-CM

## 2022-11-05 DIAGNOSIS — R12 Heartburn: Secondary | ICD-10-CM | POA: Diagnosis not present

## 2022-11-05 DIAGNOSIS — R739 Hyperglycemia, unspecified: Secondary | ICD-10-CM | POA: Diagnosis not present

## 2022-11-05 DIAGNOSIS — G4733 Obstructive sleep apnea (adult) (pediatric): Secondary | ICD-10-CM

## 2022-11-05 DIAGNOSIS — I10 Essential (primary) hypertension: Secondary | ICD-10-CM

## 2022-11-05 DIAGNOSIS — M791 Myalgia, unspecified site: Secondary | ICD-10-CM

## 2022-11-05 LAB — COMPREHENSIVE METABOLIC PANEL
ALT: 14 U/L (ref 0–35)
AST: 16 U/L (ref 0–37)
Albumin: 3.9 g/dL (ref 3.5–5.2)
Alkaline Phosphatase: 70 U/L (ref 39–117)
BUN: 13 mg/dL (ref 6–23)
CO2: 29 mEq/L (ref 19–32)
Calcium: 9 mg/dL (ref 8.4–10.5)
Chloride: 105 mEq/L (ref 96–112)
Creatinine, Ser: 0.85 mg/dL (ref 0.40–1.20)
GFR: 69.59 mL/min (ref >60.00)
Glucose, Bld: 100 mg/dL — ABNORMAL HIGH (ref 70–99)
Potassium: 4.1 mEq/L (ref 3.5–5.1)
Sodium: 142 mEq/L (ref 135–145)
Total Bilirubin: 0.4 mg/dL (ref 0.2–1.2)
Total Protein: 6.8 g/dL (ref 6.0–8.3)

## 2022-11-05 LAB — CBC WITH DIFFERENTIAL/PLATELET
Basophils Absolute: 0.1 10*3/uL (ref 0.0–0.1)
Basophils Relative: 0.7 % (ref 0.0–3.0)
Eosinophils Absolute: 0.6 10*3/uL (ref 0.0–0.7)
Eosinophils Relative: 6.7 % — ABNORMAL HIGH (ref 0.0–5.0)
HCT: 38.7 % (ref 36.0–46.0)
Hemoglobin: 12.4 g/dL (ref 12.0–15.0)
Lymphocytes Relative: 30.7 % (ref 12.0–46.0)
Lymphs Abs: 2.8 10*3/uL (ref 0.7–4.0)
MCHC: 32 g/dL (ref 30.0–36.0)
MCV: 91.9 fl (ref 78.0–100.0)
Monocytes Absolute: 1 10*3/uL (ref 0.1–1.0)
Monocytes Relative: 10.9 % (ref 3.0–12.0)
Neutro Abs: 4.6 10*3/uL (ref 1.4–7.7)
Neutrophils Relative %: 51 % (ref 43.0–77.0)
Platelets: 355 10*3/uL (ref 150.0–400.0)
RBC: 4.21 Mil/uL (ref 3.87–5.11)
RDW: 16.1 % — ABNORMAL HIGH (ref 11.5–15.5)
WBC: 9 10*3/uL (ref 4.0–10.5)

## 2022-11-05 LAB — VITAMIN D 25 HYDROXY (VIT D DEFICIENCY, FRACTURES): VITD: 18.61 ng/mL — ABNORMAL LOW (ref 30.00–100.00)

## 2022-11-05 LAB — LIPID PANEL
Cholesterol: 154 mg/dL (ref 0–200)
HDL: 46.8 mg/dL (ref >39.00)
LDL Cholesterol: 80 mg/dL (ref 0–99)
NonHDL: 106.87
Total CHOL/HDL Ratio: 3
Triglycerides: 136 mg/dL (ref 0.0–149.0)
VLDL: 27.2 mg/dL (ref 0.0–40.0)

## 2022-11-05 LAB — TSH: TSH: 2.58 u[IU]/mL (ref 0.35–5.50)

## 2022-11-05 LAB — HEMOGLOBIN A1C: Hgb A1c MFr Bld: 5.6 % (ref 4.6–6.5)

## 2022-11-05 MED ORDER — WEGOVY 0.25 MG/0.5ML ~~LOC~~ SOAJ: 0.2500 mg | SUBCUTANEOUS | 2 refills | Status: DC

## 2022-11-05 MED ORDER — DEXLANSOPRAZOLE 60 MG PO CPDR: 60.0000 mg | DELAYED_RELEASE_CAPSULE | Freq: Every day | ORAL | 1 refills | Status: DC

## 2022-11-05 MED ORDER — BUDESONIDE-FORMOTEROL FUMARATE 80-4.5 MCG/ACT IN AERO: 2.0000 | INHALATION_SPRAY | RESPIRATORY_TRACT | Status: AC

## 2022-11-05 NOTE — Assessment & Plan Note (Signed)
Uses oral appliance intermittently

## 2022-11-05 NOTE — Patient Instructions (Signed)
Hypertension, Adult High blood pressure (hypertension) is when the force of blood pumping through the arteries is too strong. The arteries are the blood vessels that carry blood from the heart throughout the body. Hypertension forces the heart to work harder to pump blood and may cause arteries to become narrow or stiff. Untreated or uncontrolled hypertension can lead to a heart attack, heart failure, a stroke, kidney disease, and other problems. A blood pressure reading consists of a higher number over a lower number. Ideally, your blood pressure should be below 120/80. The first ("top") number is called the systolic pressure. It is a measure of the pressure in your arteries as your heart beats. The second ("bottom") number is called the diastolic pressure. It is a measure of the pressure in your arteries as the heart relaxes. What are the causes? The exact cause of this condition is not known. There are some conditions that result in high blood pressure. What increases the risk? Certain factors may make you more likely to develop high blood pressure. Some of these risk factors are under your control, including: Smoking. Not getting enough exercise or physical activity. Being overweight. Having too much fat, sugar, calories, or salt (sodium) in your diet. Drinking too much alcohol. Other risk factors include: Having a personal history of heart disease, diabetes, high cholesterol, or kidney disease. Stress. Having a family history of high blood pressure and high cholesterol. Having obstructive sleep apnea. Age. The risk increases with age. What are the signs or symptoms? High blood pressure may not cause symptoms. Very high blood pressure (hypertensive crisis) may cause: Headache. Fast or irregular heartbeats (palpitations). Shortness of breath. Nosebleed. Nausea and vomiting. Vision changes. Severe chest pain, dizziness, and seizures. How is this diagnosed? This condition is diagnosed by  measuring your blood pressure while you are seated, with your arm resting on a flat surface, your legs uncrossed, and your feet flat on the floor. The cuff of the blood pressure monitor will be placed directly against the skin of your upper arm at the level of your heart. Blood pressure should be measured at least twice using the same arm. Certain conditions can cause a difference in blood pressure between your right and left arms. If you have a high blood pressure reading during one visit or you have normal blood pressure with other risk factors, you may be asked to: Return on a different day to have your blood pressure checked again. Monitor your blood pressure at home for 1 week or longer. If you are diagnosed with hypertension, you may have other blood or imaging tests to help your health care provider understand your overall risk for other conditions. How is this treated? This condition is treated by making healthy lifestyle changes, such as eating healthy foods, exercising more, and reducing your alcohol intake. You may be referred for counseling on a healthy diet and physical activity. Your health care provider may prescribe medicine if lifestyle changes are not enough to get your blood pressure under control and if: Your systolic blood pressure is above 130. Your diastolic blood pressure is above 80. Your personal target blood pressure may vary depending on your medical conditions, your age, and other factors. Follow these instructions at home: Eating and drinking  Eat a diet that is high in fiber and potassium, and low in sodium, added sugar, and fat. An example of this eating plan is called the DASH diet. DASH stands for Dietary Approaches to Stop Hypertension. To eat this way: Eat   plenty of fresh fruits and vegetables. Try to fill one half of your plate at each meal with fruits and vegetables. Eat whole grains, such as whole-wheat pasta, brown rice, or whole-grain bread. Fill about one  fourth of your plate with whole grains. Eat or drink low-fat dairy products, such as skim milk or low-fat yogurt. Avoid fatty cuts of meat, processed or cured meats, and poultry with skin. Fill about one fourth of your plate with lean proteins, such as fish, chicken without skin, beans, eggs, or tofu. Avoid pre-made and processed foods. These tend to be higher in sodium, added sugar, and fat. Reduce your daily sodium intake. Many people with hypertension should eat less than 1,500 mg of sodium a day. Do not drink alcohol if: Your health care provider tells you not to drink. You are pregnant, may be pregnant, or are planning to become pregnant. If you drink alcohol: Limit how much you have to: 0-1 drink a day for women. 0-2 drinks a day for men. Know how much alcohol is in your drink. In the [place] one drink equals one 12 oz bottle of beer (355 mL), one 5 oz glass of wine (148 mL), or one 1 oz glass of hard liquor (44 mL). Lifestyle  Work with your health care provider to maintain a healthy body weight or to lose weight. Ask what an ideal weight is for you. Get at least 30 minutes of exercise that causes your heart to beat faster (aerobic exercise) most days of the week. Activities may include walking, swimming, or biking. Include exercise to strengthen your muscles (resistance exercise), such as Pilates or lifting weights, as part of your weekly exercise routine. Try to do these types of exercises for 30 minutes at least 3 days a week. Do not use any products that contain nicotine or tobacco. These products include cigarettes, chewing tobacco, and vaping devices, such as e-cigarettes. If you need help quitting, ask your health care provider. Monitor your blood pressure at home as told by your health care provider. Keep all follow-up visits. This is important. Medicines Take over-the-counter and prescription medicines only as told by your health care provider. Follow directions carefully. Blood  pressure medicines must be taken as prescribed. Do not skip doses of blood pressure medicine. Doing this puts you at risk for problems and can make the medicine less effective. Ask your health care provider about side effects or reactions to medicines that you should watch for. Contact a health care provider if you: Think you are having a reaction to a medicine you are taking. Have headaches that keep coming back (recurring). Feel dizzy. Have swelling in your ankles. Have trouble with your vision. Get help right away if you: Develop a severe headache or confusion. Have unusual weakness or numbness. Feel faint. Have severe pain in your chest or abdomen. Vomit repeatedly. Have trouble breathing. These symptoms may be an emergency. Get help right away. Call 911. Do not wait to see if the symptoms will go away. Do not drive yourself to the hospital. Summary Hypertension is when the force of blood pumping through your arteries is too strong. If this condition is not controlled, it may put you at risk for serious complications. Your personal target blood pressure may vary depending on your medical conditions, your age, and other factors. For most people, a normal blood pressure is less than 120/80. Hypertension is treated with lifestyle changes, medicines, or a combination of both. Lifestyle changes include losing weight, eating a healthy,   low-sodium diet, exercising more, and limiting alcohol. This information is not intended to replace advice given to you by your health care provider. Make sure you discuss any questions you have with your health care provider. Document Revised: 03/27/2021 Document Reviewed: 03/27/2021 Elsevier Patient Education  2024 [employer]  

## 2022-11-05 NOTE — Progress Notes (Signed)
Subjective:   By signing my name below, I, Terri [name], attest that this documentation has been prepared under the direction and in the presence of Bradd Canary, MD., 11/05/2022.   [name], female    DOB: 09/12/1950, 72 y.o.   MRN: [id number]  No chief complaint on file.  HPI Patient is in today for an office visit. She denies recent hospitalization, febrile illness, CP/palpitations/SOB/HA/fever/chills/GI or GU symptoms.  Asthma Patient continues to use Symbicort inhaler to control her asthma, which has been helpful. She also takes Montelukast 10 mg daily to manage her allergies and uses a mouthguard at night to manage sleep apnea, which have been helpful.  GERD Patient continues taking Dexlansoprazole 60 mg to manage her heartburn, which has been helpful, and is requesting a refill on this today. She denies recent flares of heartburn.  Essential Hypertension Patient continues taking Amlodipine-Olmesartan 10-40 mg daily which has been tolerable. Today her blood pressure and pulse are normal. BP Readings from Last 3 Encounters:  11/05/22 116/70  08/13/22 118/74  08/01/22 128/78   Pulse Readings from Last 3 Encounters:  11/05/22 74  08/13/22 75  08/01/22 73   Immunizations Patient declines all offered vaccinations, which include COVID-19, Influenza, Pneumonia, RSV, Shingles, and Tetanus.  Morbid Obesity Patient's body mass index is 47.51 kg/m. She admits that she is not exercising nor maintaining a healthy diet. Today her lower extremities are swollen, but she states this is due to the summer heat.  Past Medical History:  Diagnosis Date   Allergic rhinitis    Apnea, sleep 06/28/2012   Previously used CPAP   Asthma    Atypical chest pain 01/14/2017   Back pain    Decreased visual acuity 08/05/2016   Depression 06/19/2015   Environmental and seasonal allergies    GERD (gastroesophageal reflux disease)    Heartburn    Hip pain, right 06/28/2012    HTN (hypertension)    Hypokalemia 01/14/2017   Leg pain    Morbid obesity (HCC) 04/11/2008   Qualifier: Diagnosis of  By: [name]    Numbness in feet 05/06/2016   Obesity    Osteoarthritis 01/16/2010   Qualifier: Diagnosis of  By: [name]    Preventative health care 06/25/2015   Shortness of breath    Shortness of breath on exertion    Sleep apnea    Swelling of extremity    Tinnitus    Vertigo     Past Surgical History:  Procedure Laterality Date   BIOPSY BREAST  1997   left    childbirth     COLONOSCOPY  2004   UPPER GASTROINTESTINAL ENDOSCOPY  03/01/2019    Family History  Problem Relation Age of Onset   Stroke Mother    Liver disease Mother        hepatitis b   Hypertension Mother    Cancer Father        esophagus, prostate   Hypertension Father    Heart disease Father    Esophageal cancer Father    Liver disease Brother        hep c   Mental illness Brother        PTSD from [place]   Stroke Maternal Grandmother    Cancer Paternal Grandfather        prostate   Colitis Neg Hx    Colon cancer Neg Hx    Rectal cancer Neg Hx    Stomach cancer  Neg Hx     Social History   Socioeconomic History   Marital status: Divorced    Spouse name: Not on file   Number of children: 1   Years of education: Not on file   Highest education level: Not on file  Occupational History   Occupation: HS music teacher- [employer]  Tobacco Use   Smoking status: Never   Smokeless tobacco: Never  Vaping Use   Vaping Use: Never used  Substance and Sexual Activity   Alcohol use: Yes    Comment: social   Drug use: No   Sexual activity: Not on file    Comment: band teacher, 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 no dietary restrictions  Other Topics Concern   Not on file  Social History Narrative   Single/divorced   1 son born 21 - sometimes there w/ her, leukemia 2016   Music teacher [employer]   Caffeine use 1 cup AM   Never smoker/tobacco   No drugs    Rare-occ EtOHSmoking    Social Determinants of Health   Financial Resource Strain: Low Risk  (06/12/2021)   Overall Financial Resource Strain (CARDIA)    Difficulty of Paying Living Expenses: Not hard at all  Food Insecurity: No Food Insecurity (06/12/2021)   Hunger Vital Sign    Worried About Running Out of Food in the Last Year: Never true    Ran Out of Food in the Last Year: Never true  Transportation Needs: No Transportation Needs (06/12/2021)   PRAPARE - Administrator, Civil Service (Medical): No    Lack of Transportation (Non-Medical): No  Physical Activity: Inactive (06/12/2021)   Exercise Vital Sign    Days of Exercise per Week: 0 days    Minutes of Exercise per Session: 0 min  Stress: No Stress Concern Present (06/12/2021)   [employer] of Occupational Health - Occupational Stress Questionnaire    Feeling of Stress : Not at all  Social Connections: Moderately Integrated (06/12/2021)   Social Connection and Isolation Panel [NHANES]    Frequency of Communication with Friends and Family: More than three times a week    Frequency of Social Gatherings with Friends and Family: More than three times a week    Attends Religious Services: More than 4 times per year    Active Member of [employer] or Organizations: Yes    Attends Engineer, structural: More than 4 times per year    Marital Status: Divorced  Intimate Partner Violence: Not At Risk (06/12/2021)   Humiliation, Afraid, Rape, and Kick questionnaire    Fear of Current or Ex-Partner: No    Emotionally Abused: No    Physically Abused: No    Sexually Abused: No    Outpatient Medications Prior to Visit  Medication Sig Dispense Refill   albuterol (VENTOLIN HFA) 108 (90 Base) MCG/ACT inhaler INHALE 1-2 PUFFS INTO THE LUNGS EVERY 6 (SIX) HOURS AS NEEDED FOR WHEEZING OR SHORTNESS OF BREATH. (Patient taking differently: Inhale 2 puffs into the lungs every 6 (six) hours as needed for wheezing or shortness of  breath.) 18 each 5   amLODipine-olmesartan (AZOR) 10-40 MG tablet TAKE 1 TABLET BY MOUTH EVERY DAY 90 tablet 1   dexlansoprazole (DEXILANT) 60 MG capsule TAKE 1 CAPSULE (60 MG TOTAL) BY MOUTH DAILY BEFORE BREAKFAST. 90 capsule 0   EPINEPHrine 0.3 mg/0.3 mL IJ SOAJ injection Inject 1 Dose into the muscle as needed for allergies. Inject after allergy shot for anaphylaxis  famotidine (PEPCID) 40 MG tablet TAKE 1 TABLET BY MOUTH EVERY DAY 90 tablet 3   levocetirizine (XYZAL) 5 MG tablet Take 5 mg by mouth every evening.     montelukast (SINGULAIR) 10 MG tablet TAKE 1 TABLET BY MOUTH EVERYDAY AT BEDTIME 90 tablet 1   oxybutynin (DITROPAN-XL) 10 MG 24 hr tablet Take 10 mg by mouth daily.      No facility-administered medications prior to visit.    Allergies  Allergen Reactions   Sulfamethoxazole-Trimethoprim     Other reaction(s): Unknown   Sulfonamide Derivatives    Sulfa Antibiotics Rash    Review of Systems  Constitutional:  Negative for chills and fever.  Respiratory:  Negative for shortness of breath.   Cardiovascular:  Negative for chest pain and palpitations.  Gastrointestinal:  Negative for abdominal pain, blood in stool, constipation, diarrhea, nausea and vomiting.  Genitourinary:  Negative for dysuria, frequency, hematuria and urgency.  Skin:           Neurological:  Negative for headaches.       Objective:    Physical Exam Constitutional:      General: She is not in acute distress.    Appearance: Normal appearance. She is obese. She is not ill-appearing.  HENT:     Head: Normocephalic and atraumatic.     Right Ear: External ear normal.     Left Ear: External ear normal.     Nose: Nose normal.     Mouth/Throat:     Mouth: Mucous membranes are moist.     Pharynx: Oropharynx is clear.  Eyes:     General:        Right eye: No discharge.        Left eye: No discharge.     Extraocular Movements: Extraocular movements intact.     Conjunctiva/sclera: Conjunctivae  normal.     Pupils: Pupils are equal, round, and reactive to light.  Cardiovascular:     Rate and Rhythm: Normal rate and regular rhythm.     Pulses: Normal pulses.     Heart sounds: Normal heart sounds. No murmur heard.    No gallop.  Pulmonary:     Effort: Pulmonary effort is normal. No respiratory distress.     Breath sounds: Normal breath sounds. No wheezing or rales.  Abdominal:     General: Bowel sounds are normal.     Palpations: Abdomen is soft.     Tenderness: There is no abdominal tenderness. There is no guarding.  Musculoskeletal:        General: Normal range of motion.     Cervical back: Normal range of motion.     Right lower leg: Edema present.     Left lower leg: Edema present.  Skin:    General: Skin is warm and dry.  Neurological:     Mental Status: She is alert and oriented to person, place, and time.  Psychiatric:        Mood and Affect: Mood normal.        Behavior: Behavior normal.        Judgment: Judgment normal.     There were no vitals taken for this visit. Wt Readings from Last 3 Encounters:  08/13/22 271 lb (122.9 kg)  08/01/22 272 lb 3.2 oz (123.5 kg)  07/02/22 274 lb (124.3 kg)    Diabetic Foot Exam - Simple   No data filed    Lab Results  Component Value Date   WBC 9.8 08/01/2022  HGB 12.2 08/01/2022   HCT 38.3 08/01/2022   PLT 408.0 (H) 08/01/2022   GLUCOSE 93 08/01/2022   CHOL 143 08/01/2022   TRIG 116.0 08/01/2022   HDL 44.50 08/01/2022   LDLCALC 76 08/01/2022   ALT 10 08/01/2022   AST 11 08/01/2022   NA 142 08/01/2022   K 4.1 08/01/2022   CL 105 08/01/2022   CREATININE 0.96 08/01/2022   BUN 15 08/01/2022   CO2 30 08/01/2022   TSH 1.77 08/01/2022   HGBA1C 5.5 08/01/2022    Lab Results  Component Value Date   TSH 1.77 08/01/2022   Lab Results  Component Value Date   WBC 9.8 08/01/2022   HGB 12.2 08/01/2022   HCT 38.3 08/01/2022   MCV 93.5 08/01/2022   PLT 408.0 (H) 08/01/2022   Lab Results  Component Value  Date   NA 142 08/01/2022   K 4.1 08/01/2022   CO2 30 08/01/2022   GLUCOSE 93 08/01/2022   BUN 15 08/01/2022   CREATININE 0.96 08/01/2022   BILITOT 0.6 08/01/2022   ALKPHOS 77 08/01/2022   AST 11 08/01/2022   ALT 10 08/01/2022   PROT 6.9 08/01/2022   ALBUMIN 3.8 08/01/2022   CALCIUM 9.6 08/01/2022   ANIONGAP 7 03/19/2019   EGFR 59 (L) 03/07/2021   GFR 60.24 08/01/2022   Lab Results  Component Value Date   CHOL 143 08/01/2022   Lab Results  Component Value Date   HDL 44.50 08/01/2022   Lab Results  Component Value Date   LDLCALC 76 08/01/2022   Lab Results  Component Value Date   TRIG 116.0 08/01/2022   Lab Results  Component Value Date   CHOLHDL 3 08/01/2022   Lab Results  Component Value Date   HGBA1C 5.5 08/01/2022      Assessment & Plan:  Asthma: This is controlled with Symbicort 80-4.5 mcg/act.  Essential Hypertension: This is well-controlled with Amlodipine-Olmesartan 10-40 mg daily. There were no medication adjustments today.  GERD: This is controlled with Dexlansoprazole 60 mg which has been refilled today.  Hyperlipidemia: This is controlled and monitored.  Immunizations: Encouraged patient to consider annual COVID-19 and Influenza vaccinations as well as RSV and Tetanus vaccinations. She declines all offered vaccinations.  Labs: Routine blood work ordered.  Morbid Obesity: Patient's body mass index is 47.51 kg/m. Encouraged 6-8 hours of sleep, heart healthy diet, 60-80 oz of non-alcohol/non-caffeinated fluids, and 4000-8000 steps daily. Recommended compression socks. Wegovy 0.25/0.5 mL prescribed.  Problem List Items Addressed This Visit       Cardiovascular and Mediastinum   Essential hypertension - Primary     Other   Hyperlipidemia, mixed   Morbid obesity (HCC)   Vitamin D deficiency   Hyperglycemia   Myalgia   No orders of the defined types were placed in this encounter.  I, Terri [name], personally preformed the services  described in this documentation.  All medical record entries made by the scribe were at my direction and in my presence.  I have reviewed the chart and discharge instructions (if applicable) and agree that the record reflects my personal performance and is accurate and complete. 11/05/2022  I,Mohammed Iqbal,acting as a scribe for Danise Edge, MD.,have documented all relevant documentation on the behalf of Danise Edge, MD,as directed by  Danise Edge, MD while in the presence of Danise Edge, MD.  Terri [name]

## 2022-11-06 ENCOUNTER — Encounter: Payer: Self-pay | Admitting: Family Medicine

## 2022-11-06 ENCOUNTER — Other Ambulatory Visit: Payer: Self-pay

## 2022-11-06 MED ORDER — VITAMIN D (ERGOCALCIFEROL) 1.25 MG (50000 UNIT) PO CAPS: 50000.0000 [IU] | ORAL_CAPSULE | ORAL | 4 refills | Status: DC

## 2022-11-06 NOTE — Assessment & Plan Note (Signed)
Given rx for Dexilant which works well for her. Avoid offending foods, start probiotics. Do not eat large meals in late evening and consider raising head of bed.

## 2022-12-12 ENCOUNTER — Other Ambulatory Visit: Payer: Self-pay | Admitting: Family Medicine

## 2022-12-24 DIAGNOSIS — H35362 Drusen (degenerative) of macula, left eye: Secondary | ICD-10-CM | POA: Diagnosis not present

## 2022-12-24 DIAGNOSIS — H25813 Combined forms of age-related cataract, bilateral: Secondary | ICD-10-CM | POA: Diagnosis not present

## 2022-12-24 DIAGNOSIS — H524 Presbyopia: Secondary | ICD-10-CM | POA: Diagnosis not present

## 2022-12-24 DIAGNOSIS — H40013 Open angle with borderline findings, low risk, bilateral: Secondary | ICD-10-CM | POA: Diagnosis not present

## 2022-12-25 ENCOUNTER — Ambulatory Visit (INDEPENDENT_AMBULATORY_CARE_PROVIDER_SITE_OTHER): Payer: Medicare PPO | Admitting: Emergency Medicine

## 2022-12-25 VITALS — Ht 64.0 in | Wt 276.0 lb

## 2022-12-25 DIAGNOSIS — Z0000 Encounter for general adult medical examination without abnormal findings: Secondary | ICD-10-CM

## 2022-12-25 NOTE — Progress Notes (Signed)
Subjective:   Per patient no change in vitals since last visit; unable to obtain new vitals due to this being a telehealth visit.  Patient was unable to self-report vital signs via telehealth due to a lack of equipment at home.   Terri [name] is a 72 y.o. female who presents for Medicare Annual (Subsequent) preventive examination.  Visit Complete: Virtual  I connected with  Terri [name] on 12/25/22 by a audio enabled telemedicine application and verified that I am speaking with the correct person using two identifiers.  Patient Location: Home  Provider Location: Home Office  I discussed the limitations of evaluation and management by telemedicine. The patient expressed understanding and agreed to proceed.   Review of Systems     Cardiac Risk Factors include: advanced age (>4men, >34 women);obesity (BMI >30kg/m2);hypertension;Other (see comment), Risk factor comments: OSA     Objective:    Today's Vitals   12/25/22 1557  Weight: 276 lb (125.2 kg)  Height: 5\' 4"  (1.626 m)   Body mass index is 47.38 kg/m.     12/25/2022    4:20 PM 07/18/2022    6:20 PM 06/12/2021    1:09 PM 05/04/2021    8:06 AM  Advanced Directives  Does Patient Have a Medical Advance Directive? No No No No  Would patient like information on creating a medical advance directive? Yes (MAU/Ambulatory/Procedural Areas - Information given)  No - Patient declined No - Patient declined    Current Medications (verified) Outpatient Encounter Medications as of 12/25/2022  Medication Sig   albuterol (VENTOLIN HFA) 108 (90 Base) MCG/ACT inhaler INHALE 1-2 PUFFS BY MOUTH EVERY 6 HOURS AS NEEDED FOR WHEEZE OR SHORTNESS OF BREATH   amLODipine-olmesartan (AZOR) 10-40 MG tablet TAKE 1 TABLET BY MOUTH EVERY DAY   budesonide-formoterol (SYMBICORT) 80-4.5 MCG/ACT inhaler Inhale 2 puffs into the lungs See admin instructions. Twice monthly   famotidine (PEPCID) 40 MG tablet TAKE 1 TABLET BY MOUTH EVERY DAY    levocetirizine (XYZAL) 5 MG tablet Take 5 mg by mouth every evening.   montelukast (SINGULAIR) 10 MG tablet TAKE 1 TABLET BY MOUTH EVERYDAY AT BEDTIME   omeprazole (PRILOSEC OTC) 20 MG tablet Take 20 mg by mouth daily.   oxybutynin (DITROPAN-XL) 10 MG 24 hr tablet Take 10 mg by mouth daily.    Vitamin D, Ergocalciferol, (DRISDOL) 1.25 MG (50000 UNIT) CAPS capsule Take 1 capsule (50,000 Units total) by mouth every 7 (seven) days.   dexlansoprazole (DEXILANT) 60 MG capsule Take 1 capsule (60 mg total) by mouth daily. (Patient not taking: Reported on 12/25/2022)   EPINEPHrine 0.3 mg/0.3 mL IJ SOAJ injection Inject 1 Dose into the muscle as needed for allergies. Inject after allergy shot for anaphylaxis (Patient not taking: Reported on 12/25/2022)   Semaglutide-Weight Management (WEGOVY) 0.25 MG/0.5ML SOAJ Inject 0.25 mg into the skin once a week. (Patient not taking: Reported on 12/25/2022)   No facility-administered encounter medications on file as of 12/25/2022.    Allergies (verified) Sulfamethoxazole-trimethoprim, Sulfonamide derivatives, and Sulfa antibiotics   History: Past Medical History:  Diagnosis Date   Allergic rhinitis    Apnea, sleep 06/28/2012   Previously used CPAP   Asthma    Atypical chest pain 01/14/2017   Back pain    Decreased visual acuity 08/05/2016   Depression 06/19/2015   Environmental and seasonal allergies    GERD (gastroesophageal reflux disease)    Heartburn    Hip pain, right 06/28/2012   HTN (hypertension)  Hypokalemia 01/14/2017   Leg pain    Morbid obesity (HCC) 04/11/2008   Qualifier: Diagnosis of  By: Artist Pais DO, Barbette Hair    Numbness in feet 05/06/2016   Obesity    Osteoarthritis 01/16/2010   Qualifier: Diagnosis of  By: [name]    Preventative health care 06/25/2015   Shortness of breath    Shortness of breath on exertion    Sleep apnea    Swelling of extremity    Tinnitus    Vertigo    Past Surgical History:  Procedure Laterality Date    BIOPSY BREAST  1997   left    childbirth     COLONOSCOPY  2004   UPPER GASTROINTESTINAL ENDOSCOPY  03/01/2019   Family History  Problem Relation Age of Onset   Stroke Mother    Liver disease Mother        hepatitis b   Hypertension Mother    Cancer Father        esophagus, prostate   Hypertension Father    Heart disease Father    Esophageal cancer Father    Liver disease Brother        hep c   Mental illness Brother        PTSD from [place]   Stroke Maternal Grandmother    Cancer Paternal Grandfather        prostate   Colitis Neg Hx    Colon cancer Neg Hx    Rectal cancer Neg Hx    Stomach cancer Neg Hx    Social History   Socioeconomic History   Marital status: Divorced    Spouse name: Not on file   Number of children: 1   Years of education: Not on file   Highest education level: Not on file  Occupational History   Occupation: HS music teacher- Location manager   Occupation: retired    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 no dietary restrictions  Other Topics Concern   Not on file  Social History Narrative   Single/divorced   1 son born 96 - sometimes there w/ her, leukemia 2016   Music teacher [employer]   Caffeine use 1 cup AM   Never smoker/tobacco   No drugs   Rare-occ EtOHSmoking    Social Determinants of Health   Financial Resource Strain: Low Risk  (12/25/2022)   Overall Financial Resource Strain (CARDIA)    Difficulty of Paying Living Expenses: Not hard at all  Food Insecurity: No Food Insecurity (12/25/2022)   Hunger Vital Sign    Worried About Running Out of Food in the Last Year: Never true    Ran Out of Food in the Last Year:  Never true  Transportation Needs: No Transportation Needs (12/25/2022)   PRAPARE - Administrator, Civil Service (Medical): No    Lack of Transportation (Non-Medical): No  Physical Activity: Inactive (12/25/2022)   Exercise Vital Sign    Days of Exercise per Week: 0 days    Minutes of Exercise per Session: 0 min  Stress: Stress Concern Present (12/25/2022)   [employer] of  Occupational Health - Occupational Stress Questionnaire    Feeling of Stress : To some extent  Social Connections: Moderately Integrated (12/25/2022)   Social Connection and Isolation Panel [NHANES]    Frequency of Communication with Friends and Family: More than three times a week    Frequency of Social Gatherings with Friends and Family: More than three times a week    Attends Religious Services: More than 4 times per year    Active Member of [employer] or Organizations: Yes    Attends Engineer, structural: More than 4 times per year    Marital Status: Divorced    Tobacco Counseling Counseling given: Not Answered   Clinical Intake:  Pre-visit preparation completed: Yes  Pain : No/denies pain     BMI - recorded: 47.38 Nutritional Status: BMI > 30  Obese Nutritional Risks: None Diabetes: No  How often do you need to have someone help you when you read instructions, pamphlets, or other written materials from your doctor or pharmacy?: 1 - Never  Interpreter Needed?: No  Comments: Tora Kindred, CMA   Activities of Daily Living    12/25/2022    4:02 PM  In your present state of health, do you have any difficulty performing the following activities:  Hearing? 1  Comment hearing aid in left ear  Vision? 0  Difficulty concentrating or making decisions? 0  Walking or climbing stairs? 1  Comment uses cane if walking long distances  Dressing or bathing? 0  Doing errands, shopping? 0  Preparing Food and eating ? N  Using the Toilet? N  In the past six months, have you accidently  leaked urine? Y  Comment wears a pad  Do you have problems with loss of bowel control? N  Managing your Medications? N  Managing your Finances? N  Housekeeping or managing your Housekeeping? N    Patient Care Team: Bradd Canary, MD as PCP - General (Family Medicine) Annamaria Helling, MD as Consulting Physician (Obstetrics and Gynecology)  Indicate any recent Medical Services you may have received from other than [facility] providers in the past year (date may be approximate).     Assessment:   This is a routine wellness examination for Kiira.  Hearing/Vision screen Hearing Screening - Comments:: Wears hearing aid in left ear  Dietary issues and exercise activities discussed:     Goals Addressed               This Visit's Progress     Patient Stated (pt-stated)        Exercise more to lose weight      Depression Screen    12/25/2022    4:18 PM 11/05/2022    1:06 PM 08/01/2022   11:32 AM 06/12/2021    1:14 PM 11/28/2020   10:05 AM 08/16/2019   10:25 AM 12/22/2017    9:39 AM  PHQ 2/9 Scores  PHQ - 2 Score 0 0 0 0 0 0 1  PHQ- 9 Score 0 0 0   1 6    Fall Risk    12/25/2022    4:21 PM 11/05/2022    1:06 PM 08/01/2022   11:31 AM 03/07/2022    3:16 PM 06/12/2021    1:11 PM  Fall Risk   Falls in the past year? 1 0 1 0 0  Number falls in past yr: 0 0 0 0 0  Injury with Fall? 0 0 0 0 0  Risk for fall due to : Impaired mobility  Follow up Falls prevention discussed Falls evaluation completed Falls evaluation completed Falls evaluation completed Falls prevention discussed    MEDICARE RISK AT HOME:  Medicare Risk at Home - 12/25/22 1621     Any stairs in or around the home? Yes    If so, are there any without handrails? Yes    Home free of loose throw rugs in walkways, pet beds, electrical cords, etc? Yes    Adequate lighting in your home to reduce risk of falls? Yes    Life alert? No    Use of a cane, walker or w/c? Yes   cane   Grab bars in the bathroom? No    Shower  chair or bench in shower? No    Elevated toilet seat or a handicapped toilet? Yes             TIMED UP AND GO:  Was the test performed?  No    Cognitive Function:        12/25/2022    4:22 PM  6CIT Screen  What Year? 0 points  What month? 0 points  What time? 0 points  Count back from 20 0 points  Months in reverse 0 points  Repeat phrase 0 points  Total Score 0 points    Immunizations Immunization History  Administered Date(s) Administered   PFIZER Comirnaty(Gray Top)Covid-19 Tri-Sucrose Vaccine 07/18/2020, 08/18/2020   Td 11/29/2008    TDAP status: Due, Education has been provided regarding the importance of this vaccine. Advised may receive this vaccine at local pharmacy or Health Dept. Aware to provide a copy of the vaccination record if obtained from local pharmacy or Health Dept. Verbalized acceptance and understanding. Patient declines Tdap.  Flu Vaccine status: Declined, Education has been provided regarding the importance of this vaccine but patient still declined. Advised may receive this vaccine at local pharmacy or Health Dept. Aware to provide a copy of the vaccination record if obtained from local pharmacy or Health Dept. Verbalized acceptance and understanding.  Pneumococcal vaccine status: Declined,  Education has been provided regarding the importance of this vaccine but patient still declined. Advised may receive this vaccine at local pharmacy or Health Dept. Aware to provide a copy of the vaccination record if obtained from local pharmacy or Health Dept. Verbalized acceptance and understanding.   Covid-19 vaccine status: Declined, Education has been provided regarding the importance of this vaccine but patient still declined. Advised may receive this vaccine at local pharmacy or Health Dept.or vaccine clinic. Aware to provide a copy of the vaccination record if obtained from local pharmacy or Health Dept. Verbalized acceptance and  understanding.  Qualifies for Shingles Vaccine? Yes   Zostavax completed No   Shingrix Completed?: No.    Education has been provided regarding the importance of this vaccine. Patient has been advised to call insurance company to determine out of pocket expense if they have not yet received this vaccine. Advised may also receive vaccine at local pharmacy or Health Dept. Verbalized acceptance and understanding.  Screening Tests Health Maintenance  Topic Date Due   COVID-19 Vaccine (3 - 2023-24 season) 12/27/2022 (Originally 02/01/2022)   Zoster Vaccines- Shingrix (1 of 2) 02/05/2023 (Originally 10/14/2002)   Pneumonia Vaccine 59+ Years old (1 of 1 - PCV) 11/05/2023 (Originally 10/13/2017)   INFLUENZA VACCINE  01/02/2023   DEXA SCAN  07/20/2023   MAMMOGRAM  08/01/2023   Medicare Annual Wellness (AWV)  12/25/2023   Colonoscopy  02/28/2029   Hepatitis C Screening  Completed  HPV VACCINES  Aged Out   DTaP/Tdap/Td  Discontinued    Health Maintenance  There are no preventive care reminders to display for this patient.   Colorectal cancer screening: Type of screening: Colonoscopy. Completed 03/01/19. Repeat every 10 years  Mammogram status: Completed 07/31/22. Repeat every year  Bone Density status: Completed 07/19/21. Results reflect: Bone density results: OSTEOPENIA. Repeat every 2 years.  Lung Cancer Screening: (Low Dose CT Chest recommended if Age 63-80 years, 20 pack-year currently smoking OR have quit w/in 15years.) does not qualify.   Lung Cancer Screening Referral: n/a  Additional Screening:  Hepatitis C Screening: does qualify; Completed 06/19/15   Dental Screening: Recommended annual dental exams for proper oral hygiene  Community Resource Referral / Chronic Care Management: CRR required this visit?  No   CCM required this visit?  No     Plan:     I have personally reviewed and noted the following in the patient's chart:   Medical and social history Use of alcohol,  tobacco or illicit drugs  Current medications and supplements including opioid prescriptions. Patient is not currently taking opioid prescriptions. Functional ability and status Nutritional status Physical activity Advanced directives List of other physicians Hospitalizations, surgeries, and ER visits in previous 12 months Vitals Screenings to include cognitive, depression, and falls Referrals and appointments  In addition, I have reviewed and discussed with patient certain preventive protocols, quality metrics, and best practice recommendations. A written personalized care plan for preventive services as well as general preventive health recommendations were provided to patient.     Tora Kindred, CMA   12/25/2022   After Visit Summary: (MyChart) Due to this being a telephonic visit, the after visit summary with patients personalized plan was offered to patient via MyChart   Nurse Notes:  Patient refuses all vaccines, except Shingrix. She is thinking about that.

## 2022-12-25 NOTE — Patient Instructions (Signed)
[name] , Thank you for taking time to come for your Medicare Wellness Visit. I appreciate your ongoing commitment to your health goals. Please review the following plan we discussed and let me know if I can assist you in the future.   These are the goals we discussed:  Goals       Patient Stated (pt-stated)      Exercise more to lose weight        This is a list of the screening recommended for you and due dates:  Health Maintenance  Topic Date Due   COVID-19 Vaccine (3 - 2023-24 season) 12/27/2022*   Zoster (Shingles) Vaccine (1 of 2) 02/05/2023*   Pneumonia Vaccine (1 of 1 - PCV) 11/05/2023*   Flu Shot  01/02/2023   DEXA scan (bone density measurement)  07/20/2023   Mammogram  08/01/2023   Medicare Annual Wellness Visit  12/25/2023   Colon Cancer Screening  02/28/2029   Hepatitis C Screening  Completed   HPV Vaccine  Aged Out   DTaP/Tdap/Td vaccine  Discontinued  *Topic was postponed. The date shown is not the original due date.    Advanced directives: Information on Advanced Care Planning can be found at [facility] [place] Advance Health Care Directives Advance Health Care Directives ([email or web address]    Conditions/risks identified: Consider getting the shingles vaccine. Complete the advanced directives paperwork and bring to your PCPs office to be scanned to your chart. Continue to work on diet and exercise.  Next appointment: Follow up in one year for your annual wellness visit 12/30/23 @ 3:40pm   Preventive Care 65 Years and Older, Female Preventive care refers to lifestyle choices and visits with your health care provider that can promote health and wellness. What does preventive care include? A yearly physical exam. This is also called an annual well check. Dental exams once or twice a year. Routine eye exams. Ask your health care provider how often you should have your eyes checked. Personal lifestyle choices, including: Daily care of your teeth and  gums. Regular physical activity. Eating a healthy diet. Avoiding tobacco and drug use. Limiting alcohol use. Practicing safe sex. Taking low-dose aspirin every day. Taking vitamin and mineral supplements as recommended by your health care provider. What happens during an annual well check? The services and screenings done by your health care provider during your annual well check will depend on your age, overall health, lifestyle risk factors, and family history of disease. Counseling  Your health care provider may ask you questions about your: Alcohol use. Tobacco use. Drug use. Emotional well-being. Home and relationship well-being. Sexual activity. Eating habits. History of falls. Memory and ability to understand (cognition). Work and work Astronomer. Reproductive health. Screening  You may have the following tests or measurements: Height, weight, and BMI. Blood pressure. Lipid and cholesterol levels. These may be checked every 5 years, or more frequently if you are over 19 years old. Skin check. Lung cancer screening. You may have this screening every year starting at age 70 if you have a 30-pack-year history of smoking and currently smoke or have quit within the past 15 years. Fecal occult blood test (FOBT) of the stool. You may have this test every year starting at age 35. Flexible sigmoidoscopy or colonoscopy. You may have a sigmoidoscopy every 5 years or a colonoscopy every 10 years starting at age 95. Hepatitis C blood test. Hepatitis B blood test. Sexually transmitted disease (STD) testing. Diabetes screening. This is done by  checking your blood sugar (glucose) after you have not eaten for a while (fasting). You may have this done every 1-3 years. Bone density scan. This is done to screen for osteoporosis. You may have this done starting at age 54. Mammogram. This may be done every 1-2 years. Talk to your health care provider about how often you should have regular  mammograms. Talk with your health care provider about your test results, treatment options, and if necessary, the need for more tests. Vaccines  Your health care provider may recommend certain vaccines, such as: Influenza vaccine. This is recommended every year. Tetanus, diphtheria, and acellular pertussis (Tdap, Td) vaccine. You may need a Td booster every 10 years. Zoster vaccine. You may need this after age 6. Pneumococcal 13-valent conjugate (PCV13) vaccine. One dose is recommended after age 32. Pneumococcal polysaccharide (PPSV23) vaccine. One dose is recommended after age 82. Talk to your health care provider about which screenings and vaccines you need and how often you need them. This information is not intended to replace advice given to you by your health care provider. Make sure you discuss any questions you have with your health care provider. Document Released: 06/16/2015 Document Revised: 02/07/2016 Document Reviewed: 03/21/2015 Elsevier Interactive Patient Education  2017 [employer].  Fall Prevention in the Home Falls can cause injuries. They can happen to people of all ages. There are many things you can do to make your home safe and to help prevent falls. What can I do on the outside of my home? Regularly fix the edges of walkways and driveways and fix any cracks. Remove anything that might make you trip as you walk through a door, such as a raised step or threshold. Trim any bushes or trees on the path to your home. Use bright outdoor lighting. Clear any walking paths of anything that might make someone trip, such as rocks or tools. Regularly check to see if handrails are loose or broken. Make sure that both sides of any steps have handrails. Any raised decks and porches should have guardrails on the edges. Have any leaves, snow, or ice cleared regularly. Use sand or salt on walking paths during winter. Clean up any spills in your garage right away. This includes oil  or grease spills. What can I do in the bathroom? Use night lights. Install grab bars by the toilet and in the tub and shower. Do not use towel bars as grab bars. Use non-skid mats or decals in the tub or shower. If you need to sit down in the shower, use a plastic, non-slip stool. Keep the floor dry. Clean up any water that spills on the floor as soon as it happens. Remove soap buildup in the tub or shower regularly. Attach bath mats securely with double-sided non-slip rug tape. Do not have throw rugs and other things on the floor that can make you trip. What can I do in the bedroom? Use night lights. Make sure that you have a light by your bed that is easy to reach. Do not use any sheets or blankets that are too big for your bed. They should not hang down onto the floor. Have a firm chair that has side arms. You can use this for support while you get dressed. Do not have throw rugs and other things on the floor that can make you trip. What can I do in the kitchen? Clean up any spills right away. Avoid walking on wet floors. Keep items that you use a  lot in easy-to-reach places. If you need to reach something above you, use a strong step stool that has a grab bar. Keep electrical cords out of the way. Do not use floor polish or wax that makes floors slippery. If you must use wax, use non-skid floor wax. Do not have throw rugs and other things on the floor that can make you trip. What can I do with my stairs? Do not leave any items on the stairs. Make sure that there are handrails on both sides of the stairs and use them. Fix handrails that are broken or loose. Make sure that handrails are as long as the stairways. Check any carpeting to make sure that it is firmly attached to the stairs. Fix any carpet that is loose or worn. Avoid having throw rugs at the top or bottom of the stairs. If you do have throw rugs, attach them to the floor with carpet tape. Make sure that you have a light  switch at the top of the stairs and the bottom of the stairs. If you do not have them, ask someone to add them for you. What else can I do to help prevent falls? Wear shoes that: Do not have high heels. Have rubber bottoms. Are comfortable and fit you well. Are closed at the toe. Do not wear sandals. If you use a stepladder: Make sure that it is fully opened. Do not climb a closed stepladder. Make sure that both sides of the stepladder are locked into place. Ask someone to hold it for you, if possible. Clearly mark and make sure that you can see: Any grab bars or handrails. First and last steps. Where the edge of each step is. Use tools that help you move around (mobility aids) if they are needed. These include: Canes. Walkers. Scooters. Crutches. Turn on the lights when you go into a dark area. Replace any light bulbs as soon as they burn out. Set up your furniture so you have a clear path. Avoid moving your furniture around. If any of your floors are uneven, fix them. If there are any pets around you, be aware of where they are. Review your medicines with your doctor. Some medicines can make you feel dizzy. This can increase your chance of falling. Ask your doctor what other things that you can do to help prevent falls. This information is not intended to replace advice given to you by your health care provider. Make sure you discuss any questions you have with your health care provider. Document Released: 03/16/2009 Document Revised: 10/26/2015 Document Reviewed: 06/24/2014 Elsevier Interactive Patient Education  2017 [employer].

## 2023-03-06 ENCOUNTER — Other Ambulatory Visit: Payer: Self-pay | Admitting: Family Medicine

## 2023-03-07 DIAGNOSIS — J4540 Moderate persistent asthma, uncomplicated: Secondary | ICD-10-CM | POA: Diagnosis not present

## 2023-03-07 DIAGNOSIS — H1045 Other chronic allergic conjunctivitis: Secondary | ICD-10-CM | POA: Diagnosis not present

## 2023-03-07 DIAGNOSIS — R519 Headache, unspecified: Secondary | ICD-10-CM | POA: Diagnosis not present

## 2023-03-07 DIAGNOSIS — J3089 Other allergic rhinitis: Secondary | ICD-10-CM | POA: Diagnosis not present

## 2023-03-07 DIAGNOSIS — J301 Allergic rhinitis due to pollen: Secondary | ICD-10-CM | POA: Diagnosis not present

## 2023-03-18 ENCOUNTER — Other Ambulatory Visit: Payer: Self-pay | Admitting: Family Medicine

## 2023-03-18 DIAGNOSIS — R059 Cough, unspecified: Secondary | ICD-10-CM

## 2023-03-18 DIAGNOSIS — J309 Allergic rhinitis, unspecified: Secondary | ICD-10-CM

## 2023-03-31 ENCOUNTER — Encounter: Payer: Medicare PPO | Admitting: Family Medicine

## 2023-05-02 ENCOUNTER — Other Ambulatory Visit: Payer: Self-pay | Admitting: Family Medicine

## 2023-05-09 ENCOUNTER — Other Ambulatory Visit: Payer: Self-pay | Admitting: Family Medicine

## 2023-05-25 ENCOUNTER — Other Ambulatory Visit: Payer: Self-pay | Admitting: Family Medicine

## 2023-06-09 DIAGNOSIS — L650 Telogen effluvium: Secondary | ICD-10-CM | POA: Diagnosis not present

## 2023-06-09 DIAGNOSIS — L2084 Intrinsic (allergic) eczema: Secondary | ICD-10-CM | POA: Diagnosis not present

## 2023-06-09 DIAGNOSIS — L658 Other specified nonscarring hair loss: Secondary | ICD-10-CM | POA: Diagnosis not present

## 2023-06-10 ENCOUNTER — Ambulatory Visit: Payer: Medicare PPO | Admitting: Family Medicine

## 2023-06-10 ENCOUNTER — Ambulatory Visit (INDEPENDENT_AMBULATORY_CARE_PROVIDER_SITE_OTHER): Payer: Medicare PPO

## 2023-06-10 ENCOUNTER — Other Ambulatory Visit: Payer: Self-pay

## 2023-06-10 VITALS — BP 118/76 | HR 82 | Ht 64.0 in | Wt 285.0 lb

## 2023-06-10 DIAGNOSIS — M47817 Spondylosis without myelopathy or radiculopathy, lumbosacral region: Secondary | ICD-10-CM | POA: Diagnosis not present

## 2023-06-10 DIAGNOSIS — M25571 Pain in right ankle and joints of right foot: Secondary | ICD-10-CM

## 2023-06-10 DIAGNOSIS — M545 Low back pain, unspecified: Secondary | ICD-10-CM | POA: Diagnosis not present

## 2023-06-10 DIAGNOSIS — G8929 Other chronic pain: Secondary | ICD-10-CM

## 2023-06-10 DIAGNOSIS — M5137 Other intervertebral disc degeneration, lumbosacral region with discogenic back pain only: Secondary | ICD-10-CM | POA: Diagnosis not present

## 2023-06-10 DIAGNOSIS — M19071 Primary osteoarthritis, right ankle and foot: Secondary | ICD-10-CM | POA: Diagnosis not present

## 2023-06-10 DIAGNOSIS — M4807 Spinal stenosis, lumbosacral region: Secondary | ICD-10-CM | POA: Diagnosis not present

## 2023-06-10 DIAGNOSIS — M47816 Spondylosis without myelopathy or radiculopathy, lumbar region: Secondary | ICD-10-CM | POA: Diagnosis not present

## 2023-06-10 NOTE — Progress Notes (Signed)
 LILLETTE Ileana Collet, PhD, LAT, ATC acting as a scribe for Artist Lloyd, MD.  Terri [name] is a 73 y.o. female who presents to [facility] at [facility] today for R ankle pain. Pt was previously seen by Dr. Lloyd on 08/13/22 for R shoulder pain.  Today, pt c/o R ankle pain ongoing since Aug-Sept. She reports episodes where R ankle will give out on her. No injury. Pain is varying and intermittent, episodes lasting a few mins. Pt locates pain to the lateral aspect of her R ankle.   R ankle swelling: no  Aggravates: INV/EV Treatments tried: walking w/ a cane,   Additionally she has pain located in the right low back extending into her buttocks and radiating down the right leg.  Pertinent review of systems: No fevers or chills  Relevant historical information: Hypertension   Exam:  BP 118/76   Pulse 82   Ht 5' 4 (1.626 m)   Wt 285 lb (129.3 kg)   SpO2 97%   BMI 48.92 kg/m  General: Well Developed, well nourished, and in no acute distress.   MSK: Right ankle normal-appearing Tender palpation at lateral ankle. Normal foot and ankle motion. Intact strength. Stable ligamentous exam.    Lab and Radiology Results  Diagnostic Limited MSK Ultrasound of: Right lateral ankle Mild hypoechoic fluid tracking along the peroneal tendons.  Tendons are intact. No significant joint effusion at lateral ankle. Impression: Tenosynovitis peroneal tendons  X-ray images right ankle and lumbar spine personally and independently interpreted.  Right ankle: Minimal arthritis medial and lateral ankle.  No acute fractures.  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  No acute fractures are visible.  Await formal radiology review   Assessment and Plan: 73 y.o. 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  So the pain may also be due to some ankle arthritis.  Plan for trial of physical therapy and if not improved consider either interarticular steroid  injection or tendon sheath injection lateral ankle.  Additionally she has a component of low back pain primarily look at the back with a little bit of radiation down the right leg.  Again this is a physical therapy problem.  Plan for PT.  Recheck in 8 weeks.  PDMP not reviewed this encounter. Orders Placed This Encounter  Procedures   US  LIMITED JOINT SPACE STRUCTURES LOW RIGHT(NO LINKED CHARGES)    Reason for Exam (SYMPTOM  OR DIAGNOSIS REQUIRED):   right ankle pain    Preferred imaging location?:   [facility] Ankle Complete Right    Standing Status:   Future    Number of Occurrences:   1    Expiration Date:   06/09/2024    Reason for Exam (SYMPTOM  OR DIAGNOSIS REQUIRED):   right ankle pain    Preferred imaging location?:   [facility]   DG Lumbar Spine 2-3 Views    Standing Status:   Future    Number of Occurrences:   1    Expiration Date:   07/11/2023    Reason for Exam (SYMPTOM  OR DIAGNOSIS REQUIRED):   low back pain    Preferred imaging location?:   [facility]   Ambulatory referral to Physical Therapy    Referral Priority:   Routine    Referral Type:   Physical Medicine    Referral Reason:   Specialty Services Required    Requested Specialty:   Physical Therapy  Number of Visits Requested:   1   No orders of the defined types were placed in this encounter.    Discussed warning signs or symptoms. Please see discharge instructions. Patient expresses understanding.   The above documentation has been reviewed and is accurate and complete Artist Lloyd, M.D.

## 2023-06-10 NOTE — Patient Instructions (Addendum)
 Thank you for coming in today.   Please get an Xray today before you leave   I've referred you to Physical Therapy.  Let us know if you don't hear from them in one week.   Check back in 8 weeks

## 2023-06-12 ENCOUNTER — Encounter: Payer: Self-pay | Admitting: Family Medicine

## 2023-06-12 NOTE — Progress Notes (Signed)
 Low back x-ray shows medium arthritis

## 2023-06-12 NOTE — Progress Notes (Signed)
 Right ankle x-ray shows a medium heel spur and some arthritis in the midfoot.  The ankle joint itself looks okay.

## 2023-06-25 ENCOUNTER — Encounter: Payer: Self-pay | Admitting: Physical Therapy

## 2023-06-25 ENCOUNTER — Ambulatory Visit: Payer: Medicare PPO | Attending: Family Medicine | Admitting: Physical Therapy

## 2023-06-25 ENCOUNTER — Other Ambulatory Visit: Payer: Self-pay

## 2023-06-25 DIAGNOSIS — M25571 Pain in right ankle and joints of right foot: Secondary | ICD-10-CM | POA: Diagnosis not present

## 2023-06-25 DIAGNOSIS — R600 Localized edema: Secondary | ICD-10-CM | POA: Diagnosis not present

## 2023-06-25 DIAGNOSIS — M6281 Muscle weakness (generalized): Secondary | ICD-10-CM | POA: Diagnosis not present

## 2023-06-25 DIAGNOSIS — G8929 Other chronic pain: Secondary | ICD-10-CM | POA: Insufficient documentation

## 2023-06-25 DIAGNOSIS — M25511 Pain in right shoulder: Secondary | ICD-10-CM | POA: Insufficient documentation
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
## 2023-06-27 ENCOUNTER — Other Ambulatory Visit: Payer: Self-pay | Admitting: Family Medicine

## 2023-06-27 ENCOUNTER — Encounter: Payer: Self-pay | Admitting: *Deleted

## 2023-07-01 ENCOUNTER — Ambulatory Visit: Payer: Medicare PPO

## 2023-07-01 DIAGNOSIS — H40013 Open angle with borderline findings, low risk, bilateral: Secondary | ICD-10-CM | POA: Diagnosis not present

## 2023-07-01 DIAGNOSIS — H35362 Drusen (degenerative) of macula, left eye: Secondary | ICD-10-CM | POA: Diagnosis not present

## 2023-07-01 NOTE — Addendum Note (Signed)
Addended by: Fredderick Phenix on: 07/01/2023 02:47 PM   Modules accepted: Orders

## 2023-07-02 ENCOUNTER — Other Ambulatory Visit: Payer: Self-pay | Admitting: Family Medicine

## 2023-07-03 ENCOUNTER — Ambulatory Visit: Payer: Medicare PPO | Admitting: Physical Therapy

## 2023-07-03 ENCOUNTER — Encounter: Payer: Self-pay | Admitting: Physical Therapy

## 2023-07-03 DIAGNOSIS — R600 Localized edema: Secondary | ICD-10-CM

## 2023-07-03 DIAGNOSIS — M25571 Pain in right ankle and joints of right foot: Secondary | ICD-10-CM

## 2023-07-03 DIAGNOSIS — M25511 Pain in right shoulder: Secondary | ICD-10-CM | POA: Diagnosis not present

## 2023-07-03 DIAGNOSIS — G8929 Other chronic pain: Secondary | ICD-10-CM | POA: Diagnosis not present

## 2023-07-03 DIAGNOSIS — M6281 Muscle weakness (generalized): Secondary | ICD-10-CM | POA: Diagnosis not present
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
## 2023-07-05 ENCOUNTER — Ambulatory Visit: Payer: Medicare PPO | Attending: Family Medicine | Admitting: Physical Therapy

## 2023-07-05 ENCOUNTER — Encounter: Payer: Self-pay | Admitting: Physical Therapy

## 2023-07-05 DIAGNOSIS — M25571 Pain in right ankle and joints of right foot: Secondary | ICD-10-CM | POA: Diagnosis not present

## 2023-07-05 DIAGNOSIS — M6281 Muscle weakness (generalized): Secondary | ICD-10-CM | POA: Diagnosis not present

## 2023-07-05 DIAGNOSIS — R600 Localized edema: Secondary | ICD-10-CM | POA: Diagnosis not present
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
## 2023-07-07 ENCOUNTER — Encounter: Payer: Self-pay | Admitting: Physical Therapy

## 2023-07-07 ENCOUNTER — Ambulatory Visit: Payer: Medicare PPO | Admitting: Physical Therapy

## 2023-07-07 DIAGNOSIS — R600 Localized edema: Secondary | ICD-10-CM | POA: Diagnosis not present

## 2023-07-07 DIAGNOSIS — M25571 Pain in right ankle and joints of right foot: Secondary | ICD-10-CM | POA: Diagnosis not present

## 2023-07-07 DIAGNOSIS — M6281 Muscle weakness (generalized): Secondary | ICD-10-CM | POA: Diagnosis not present
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
## 2023-07-09 ENCOUNTER — Ambulatory Visit: Payer: Medicare PPO

## 2023-07-09 DIAGNOSIS — M6281 Muscle weakness (generalized): Secondary | ICD-10-CM | POA: Diagnosis not present

## 2023-07-09 DIAGNOSIS — M25571 Pain in right ankle and joints of right foot: Secondary | ICD-10-CM | POA: Diagnosis not present

## 2023-07-09 DIAGNOSIS — R600 Localized edema: Secondary | ICD-10-CM | POA: Diagnosis not present
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
## 2023-07-14 ENCOUNTER — Ambulatory Visit: Payer: Medicare PPO

## 2023-07-14 DIAGNOSIS — R600 Localized edema: Secondary | ICD-10-CM | POA: Diagnosis not present

## 2023-07-14 DIAGNOSIS — M25571 Pain in right ankle and joints of right foot: Secondary | ICD-10-CM

## 2023-07-14 DIAGNOSIS — M6281 Muscle weakness (generalized): Secondary | ICD-10-CM | POA: Diagnosis not present
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
## 2023-07-16 ENCOUNTER — Ambulatory Visit: Payer: Medicare PPO

## 2023-07-16 DIAGNOSIS — M25571 Pain in right ankle and joints of right foot: Secondary | ICD-10-CM | POA: Diagnosis not present

## 2023-07-16 DIAGNOSIS — R600 Localized edema: Secondary | ICD-10-CM

## 2023-07-16 DIAGNOSIS — M6281 Muscle weakness (generalized): Secondary | ICD-10-CM
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
## 2023-07-22 ENCOUNTER — Ambulatory Visit: Payer: Medicare PPO

## 2023-07-30 ENCOUNTER — Ambulatory Visit: Payer: Medicare PPO

## 2023-07-30 DIAGNOSIS — R600 Localized edema: Secondary | ICD-10-CM

## 2023-07-30 DIAGNOSIS — M25571 Pain in right ankle and joints of right foot: Secondary | ICD-10-CM

## 2023-07-30 DIAGNOSIS — M6281 Muscle weakness (generalized): Secondary | ICD-10-CM | POA: Diagnosis not present
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
## 2023-08-05 ENCOUNTER — Ambulatory Visit: Payer: Medicare PPO | Attending: Family Medicine

## 2023-08-05 ENCOUNTER — Ambulatory Visit: Payer: Medicare PPO | Admitting: Family Medicine

## 2023-08-05 VITALS — BP 124/70 | HR 81 | Ht 64.0 in | Wt 283.4 lb

## 2023-08-05 DIAGNOSIS — R600 Localized edema: Secondary | ICD-10-CM | POA: Insufficient documentation

## 2023-08-05 DIAGNOSIS — M6281 Muscle weakness (generalized): Secondary | ICD-10-CM | POA: Diagnosis not present

## 2023-08-05 DIAGNOSIS — M25571 Pain in right ankle and joints of right foot: Secondary | ICD-10-CM | POA: Diagnosis not present

## 2023-08-05 DIAGNOSIS — G8929 Other chronic pain: Secondary | ICD-10-CM

## 2023-08-05 DIAGNOSIS — M5459 Other low back pain: Secondary | ICD-10-CM | POA: Diagnosis not present

## 2023-08-05 DIAGNOSIS — M545 Low back pain, unspecified: Secondary | ICD-10-CM | POA: Diagnosis not present
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
## 2023-08-05 NOTE — Progress Notes (Signed)
 I, Terri [name] am a scribe for Dr. Denyse [name].   Terri [name] is a 73 y.o. female who presents to [facility] at [facility] today for f/u R ankle and low back pain. Pt was last seen by Dr. Denyse [name] on 06/10/23 and was referred to PT, completing 8 visits.  Today, pt reports ankle still hurts when she turns it or twist it around.  Ankle pain is improved but not fully resolved.  Her balance has improved with physical therapy.  She notes continued low back pain.  Physical therapy would like to switch focus to her back.  She notes some chronic back pain.  Dx imaging: 06/10/23 L-spine & R ankle XR  Pertinent review of systems: No fevers or chills  Relevant historical information: Hypertension   Exam:  BP 124/70   Pulse 81   Ht 5\' 4"  (1.626 m)   Wt 283 lb 6.4 oz (128.5 kg)   SpO2 95%   BMI 48.65 kg/m  General: Well Developed, well nourished, and in no acute distress.   MSK: Ankle normal-appearing Tender palpation plantar calcaneus and lateral ankle.  Normal foot and ankle motion.  L-spine nontender to palpation midline decreased lumbar motion.    Lab and Radiology Results   EXAM: LUMBAR SPINE - 2-3 VIEW   COMPARISON:  None Available.   FINDINGS: There is diffuse decreased bone mineralization. Normal frontal and sagittal alignment. Vertebral body heights are maintained. Moderate L5-S1 disc space narrowing, vacuum phenomenon, endplate sclerosis, and anterior greater than posterior endplate osteophytes. Mild L1-2, T12-L1, and T11-12 disc space narrowing. Moderate L5-S1 greater than L4-5 facet joint sclerosis and hypertrophy. Mild bilateral sacroiliac subchondral sclerosis and vacuum phenomenon. Vascular phleboliths overlie the pelvis.   IMPRESSION: 1. Moderate L5-S1 degenerative disc and endplate changes. 2. 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     Electronically Signed   By: Neita Garnet M.D.   On: 06/12/2023  09:08   EXAM: RIGHT ANKLE - COMPLETE 3+ VIEW   COMPARISON:  None Available.   FINDINGS: The ankle mortise is symmetric and intact. Moderate plantar calcaneal heel spur. Moderate dorsal talonavicular, mild dorsal navicular-cuneiform, mild-to-moderate dorsal second tarsometatarsal degenerative osteophytes on lateral view. No acute fracture or dislocation.   IMPRESSION: 1. Moderate plantar calcaneal heel spur. 2. 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     Electronically Signed   By: Neita Garnet M.D.   On: 06/12/2023 09:07   I, Terri [name], personally (independently) visualized and performed the interpretation of the images attached in this note.    Assessment and Plan: 73 y.o. female with right lateral ankle pain and low back pain.  Ankle pain has improved but not fully resolved with PT.  Next step if needed would be injection into the ankle joint or into the peroneal tendons.  Her pain is not bad enough right now that she thinks an injection would be necessary.  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  PT could certainly help this.  Plan to refer to PT.  If not improved consider lumbar spine MRI for injection planning.  Of note her sister died unexpectedly recently.  This is obviously bothering her.   PDMP not reviewed this encounter. Orders Placed This Encounter  Procedures   Ambulatory referral to Physical Therapy    Referral Priority:   Routine    Referral Type:   Physical Medicine    Referral Reason:   Specialty Services Required    Requested Specialty:   Physical  Therapy   No orders of the defined types were placed in this encounter.    Discussed warning signs or symptoms. Please see discharge instructions. Patient expresses understanding.   The above documentation has been reviewed and is accurate and complete Terri [name], M.D.

## 2023-08-05 NOTE — Patient Instructions (Signed)
 Thank you for coming in today.   I've referred you to Physical Therapy.  Let us know if you don't hear from them in one week.  Keep working the ankle therapy and home exercises.   I can do injection whenever you need me to.   For you back if not better we can do an MRI for injection planning.

## 2023-08-06 ENCOUNTER — Encounter: Payer: Self-pay | Admitting: Family Medicine

## 2023-08-10 ENCOUNTER — Other Ambulatory Visit: Payer: Self-pay | Admitting: Family Medicine

## 2023-08-12 ENCOUNTER — Encounter: Payer: Medicare PPO | Admitting: Family Medicine

## 2023-08-20 ENCOUNTER — Ambulatory Visit: Admitting: Physical Therapy

## 2023-08-21 ENCOUNTER — Ambulatory Visit: Admitting: Physical Therapy

## 2023-08-21 ENCOUNTER — Encounter: Payer: Self-pay | Admitting: Physical Therapy

## 2023-08-21 DIAGNOSIS — M6281 Muscle weakness (generalized): Secondary | ICD-10-CM

## 2023-08-21 DIAGNOSIS — M5459 Other low back pain: Secondary | ICD-10-CM

## 2023-08-21 DIAGNOSIS — M25571 Pain in right ankle and joints of right foot: Secondary | ICD-10-CM | POA: Diagnosis not present

## 2023-08-21 DIAGNOSIS — R600 Localized edema: Secondary | ICD-10-CM | POA: Diagnosis not present
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
## 2023-08-27 ENCOUNTER — Ambulatory Visit

## 2023-08-27 DIAGNOSIS — M5459 Other low back pain: Secondary | ICD-10-CM | POA: Diagnosis not present

## 2023-08-27 DIAGNOSIS — M6281 Muscle weakness (generalized): Secondary | ICD-10-CM

## 2023-08-27 DIAGNOSIS — M25571 Pain in right ankle and joints of right foot: Secondary | ICD-10-CM | POA: Diagnosis not present

## 2023-08-27 DIAGNOSIS — R600 Localized edema: Secondary | ICD-10-CM | POA: Diagnosis not present
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
## 2023-09-01 ENCOUNTER — Ambulatory Visit: Admitting: Physical Therapy

## 2023-09-03 ENCOUNTER — Ambulatory Visit: Attending: Family Medicine

## 2023-09-03 DIAGNOSIS — M6281 Muscle weakness (generalized): Secondary | ICD-10-CM | POA: Diagnosis not present

## 2023-09-03 DIAGNOSIS — R600 Localized edema: Secondary | ICD-10-CM | POA: Insufficient documentation

## 2023-09-03 DIAGNOSIS — M25571 Pain in right ankle and joints of right foot: Secondary | ICD-10-CM | POA: Diagnosis not present

## 2023-09-03 DIAGNOSIS — M5459 Other low back pain: Secondary | ICD-10-CM | POA: Diagnosis not present
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
## 2023-09-04 DIAGNOSIS — Z0000 Encounter for general adult medical examination without abnormal findings: Secondary | ICD-10-CM | POA: Diagnosis not present

## 2023-09-04 DIAGNOSIS — Z1231 Encounter for screening mammogram for malignant neoplasm of breast: Secondary | ICD-10-CM | POA: Diagnosis not present

## 2023-09-04 DIAGNOSIS — Z124 Encounter for screening for malignant neoplasm of cervix: Secondary | ICD-10-CM | POA: Diagnosis not present

## 2023-09-04 DIAGNOSIS — Z01419 Encounter for gynecological examination (general) (routine) without abnormal findings: Secondary | ICD-10-CM | POA: Diagnosis not present

## 2023-09-04 DIAGNOSIS — I10 Essential (primary) hypertension: Secondary | ICD-10-CM | POA: Diagnosis not present

## 2023-09-04 LAB — HM MAMMOGRAPHY

## 2023-09-05 LAB — LIPID PANEL
Cholesterol: 141 (ref 0–200)
HDL: 43 (ref 35–70)
LDL Cholesterol: 82
Triglycerides: 81 (ref 40–160)

## 2023-09-05 LAB — BASIC METABOLIC PANEL WITH GFR
BUN: 15 (ref 4–21)
CO2: 23 — AB (ref 13–22)
Chloride: 106 (ref 99–108)
Creatinine: 1.1 (ref 0.5–1.1)
Glucose: 103
Potassium: 4.2 meq/L (ref 3.5–5.1)
Sodium: 144 (ref 137–147)

## 2023-09-05 LAB — HEPATIC FUNCTION PANEL
ALT: 11 U/L (ref 7–35)
AST: 15 (ref 13–35)
Alkaline Phosphatase: 82 (ref 25–125)
Bilirubin, Total: 0.4

## 2023-09-05 LAB — HEMOGLOBIN A1C: Hemoglobin A1C: 5.6

## 2023-09-05 LAB — CBC AND DIFFERENTIAL
HCT: 41 (ref 36–46)
Hemoglobin: 13.1 (ref 12.0–16.0)
Neutrophils Absolute: 4.7
Platelets: 369 10*3/uL (ref 150–400)
WBC: 8.7

## 2023-09-05 LAB — COMPREHENSIVE METABOLIC PANEL WITH GFR
Calcium: 9.3 (ref 8.7–10.7)
eGFR: 55

## 2023-09-05 LAB — CBC: RBC: 4.44 (ref 3.87–5.11)

## 2023-09-05 LAB — TSH: TSH: 2.11 (ref 0.41–5.90)

## 2023-09-08 ENCOUNTER — Ambulatory Visit: Admitting: Physical Therapy

## 2023-09-08 ENCOUNTER — Encounter: Payer: Self-pay | Admitting: Physical Therapy

## 2023-09-08 ENCOUNTER — Encounter: Payer: Self-pay | Admitting: Emergency Medicine

## 2023-09-08 DIAGNOSIS — M5459 Other low back pain: Secondary | ICD-10-CM

## 2023-09-08 DIAGNOSIS — R600 Localized edema: Secondary | ICD-10-CM | POA: Diagnosis not present

## 2023-09-08 DIAGNOSIS — M25571 Pain in right ankle and joints of right foot: Secondary | ICD-10-CM | POA: Diagnosis not present

## 2023-09-08 DIAGNOSIS — M6281 Muscle weakness (generalized): Secondary | ICD-10-CM | POA: Diagnosis not present
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
## 2023-09-11 ENCOUNTER — Ambulatory Visit

## 2023-09-11 DIAGNOSIS — M5459 Other low back pain: Secondary | ICD-10-CM

## 2023-09-11 DIAGNOSIS — M6281 Muscle weakness (generalized): Secondary | ICD-10-CM

## 2023-09-11 DIAGNOSIS — M25571 Pain in right ankle and joints of right foot: Secondary | ICD-10-CM | POA: Diagnosis not present

## 2023-09-11 DIAGNOSIS — R600 Localized edema: Secondary | ICD-10-CM
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
## 2023-09-15 ENCOUNTER — Ambulatory Visit

## 2023-09-15 DIAGNOSIS — M5459 Other low back pain: Secondary | ICD-10-CM

## 2023-09-15 DIAGNOSIS — M6281 Muscle weakness (generalized): Secondary | ICD-10-CM | POA: Diagnosis not present

## 2023-09-15 DIAGNOSIS — R600 Localized edema: Secondary | ICD-10-CM | POA: Diagnosis not present

## 2023-09-15 DIAGNOSIS — M25571 Pain in right ankle and joints of right foot: Secondary | ICD-10-CM | POA: Diagnosis not present
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
## 2023-09-18 ENCOUNTER — Ambulatory Visit: Admitting: Physical Therapy

## 2023-09-18 DIAGNOSIS — M5459 Other low back pain: Secondary | ICD-10-CM

## 2023-09-18 DIAGNOSIS — M25571 Pain in right ankle and joints of right foot: Secondary | ICD-10-CM | POA: Diagnosis not present

## 2023-09-18 DIAGNOSIS — M6281 Muscle weakness (generalized): Secondary | ICD-10-CM | POA: Diagnosis not present

## 2023-09-18 DIAGNOSIS — R600 Localized edema: Secondary | ICD-10-CM | POA: Diagnosis not present
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
## 2023-09-19 LAB — HM PAP SMEAR: HM Pap smear: NEGATIVE

## 2023-09-22 ENCOUNTER — Encounter: Payer: Self-pay | Admitting: Emergency Medicine

## 2023-09-23 ENCOUNTER — Encounter: Payer: Self-pay | Admitting: Emergency Medicine

## 2023-09-24 ENCOUNTER — Encounter: Payer: Self-pay | Admitting: Physical Therapy

## 2023-09-24 ENCOUNTER — Ambulatory Visit: Payer: Self-pay | Admitting: Physical Therapy

## 2023-09-24 DIAGNOSIS — M5459 Other low back pain: Secondary | ICD-10-CM

## 2023-09-24 DIAGNOSIS — R600 Localized edema: Secondary | ICD-10-CM | POA: Diagnosis not present

## 2023-09-24 DIAGNOSIS — M25571 Pain in right ankle and joints of right foot: Secondary | ICD-10-CM

## 2023-09-24 DIAGNOSIS — M6281 Muscle weakness (generalized): Secondary | ICD-10-CM | POA: Diagnosis not present
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
## 2023-11-11 DIAGNOSIS — J301 Allergic rhinitis due to pollen: Secondary | ICD-10-CM | POA: Diagnosis not present

## 2023-11-11 DIAGNOSIS — J4540 Moderate persistent asthma, uncomplicated: Secondary | ICD-10-CM | POA: Diagnosis not present

## 2023-11-11 DIAGNOSIS — H1045 Other chronic allergic conjunctivitis: Secondary | ICD-10-CM | POA: Diagnosis not present

## 2023-11-11 DIAGNOSIS — J3089 Other allergic rhinitis: Secondary | ICD-10-CM | POA: Diagnosis not present

## 2023-12-12 ENCOUNTER — Telehealth: Admitting: Emergency Medicine

## 2023-12-12 DIAGNOSIS — H9312 Tinnitus, left ear: Secondary | ICD-10-CM | POA: Diagnosis not present

## 2023-12-12 DIAGNOSIS — R519 Headache, unspecified: Secondary | ICD-10-CM | POA: Diagnosis not present

## 2023-12-12 NOTE — Patient Instructions (Addendum)
 Sharlet FORBES Rubenstein, thank you for joining Jon CHRISTELLA Belt, NP for today's virtual visit.  While this provider is not your primary care provider (PCP), if your PCP is located in our provider database this encounter information will be shared with them immediately following your visit.   A [facility] MyChart account gives you access to today's visit and all your visits, tests, and labs performed at [facility]  click here if you don't have a [facility] MyChart account or go to [email or web address]  Consent: (Patient) Terri [name] provided verbal consent for this virtual visit at the beginning of the encounter.  Current Medications:  Current Outpatient Medications:    albuterol  (VENTOLIN  HFA) 108 (90 Base) MCG/ACT inhaler, INHALE 1-2 PUFFS BY MOUTH EVERY 6 HOURS AS NEEDED FOR WHEEZE OR SHORTNESS OF BREATH, Disp: 18 each, Rfl: 5   amLODipine -olmesartan  (AZOR ) 10-40 MG tablet, Take 1 tablet by mouth daily., Disp: 90 tablet, Rfl: 1   budesonide -formoterol  (SYMBICORT ) 80-4.5 MCG/ACT inhaler, Inhale 2 puffs into the lungs See admin instructions. Twice monthly, Disp: 1 each, Rfl:    dexlansoprazole  (DEXILANT ) 60 MG capsule, Take 1 capsule (60 mg total) by mouth daily., Disp: 90 capsule, Rfl: 1   EPINEPHrine 0.3 mg/0.3 mL IJ SOAJ injection, Inject 1 Dose into the muscle as needed for allergies. Inject after allergy shot for anaphylaxis, Disp: , Rfl:    famotidine  (PEPCID ) 40 MG tablet, TAKE 1 TABLET BY MOUTH EVERY DAY, Disp: 90 tablet, Rfl: 3   levocetirizine (XYZAL) 5 MG tablet, Take 5 mg by mouth every evening., Disp: , Rfl:    omeprazole  (PRILOSEC  OTC) 20 MG tablet, Take 20 mg by mouth daily., Disp: , Rfl:    oxybutynin (DITROPAN-XL) 10 MG 24 hr tablet, Take 10 mg by mouth daily. , Disp: , Rfl:    Semaglutide -Weight Management (WEGOVY ) 0.25 MG/0.5ML SOAJ, Inject 0.25 mg into the skin once a week., Disp: 2 mL, Rfl: 2   Vitamin D , Ergocalciferol , (DRISDOL ) 1.25 MG (50000 UNIT) CAPS  capsule, TAKE 1 CAPSULE (50,000 UNITS TOTAL) BY MOUTH EVERY 7 (SEVEN) DAYS, Disp: 5 capsule, Rfl: 4   Medications ordered in this encounter:  No orders of the defined types were placed in this encounter.    *If you need refills on other medications prior to your next appointment, please contact your pharmacy*  Follow-Up: Call back or seek an in-person evaluation if the symptoms worsen or if the condition fails to improve as anticipated.  [facility] [phone]  Other Instructions Please reach out to Dr. Domenica for an appointment and follow up and keep the ENT appointment as scheduled. I am concerned you could have inflammation in your arteries in your head on the left side.   Keep in mind stroke symptoms:  F = Face Drooping - Does one side of the face droop or is it numb? Ask the person to smile. Is the person's smile uneven? A = Arm Weakness - Is one arm weak or numb? Ask the person to raise both arms. Does one arm drift downward? S = Speech Difficulty - Is speech slurred? T = Time to call 911 - Stroke is an emergency. Every minute counts. Call 911 immediately. Note the time when any of the symptoms first appear.   If you have been instructed to have an in-person evaluation today at a local Urgent Care facility, please use the link below. It will take you to a list of all of our available [facility], including address, phone number and hours of operation. Please do not delay care.  [facility]  If you or a family member do not have a primary care provider, use the link below to schedule a visit and establish care. When you choose a [facility] primary care physician or advanced practice provider, you gain a long-term partner in health. Find a Primary Care Provider  Learn more about [facility]'s in-office and virtual care options: [facility] - Get Care Now
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
## 2023-12-16 ENCOUNTER — Other Ambulatory Visit: Payer: Self-pay | Admitting: Family Medicine

## 2023-12-16 NOTE — Progress Notes (Unsigned)
   Acute Office Visit  Subjective:     Patient ID: KOBI ALLER, female    DOB: 1951-04-22, 73 y.o.   MRN: [id number]  No chief complaint on file.   HPI Patient is in today for ***  Followed by Derm, Cards, ENT, Audiology  eing seen today for loud swishing sound in her left ear, L temple pain that radiates down to neck and shoulder. Sx for 2 weeks. Associated with some L eye blurriness intermittently, some L upper gums soreness. 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 Saw ENT then, given prednisone  for 2 weeks and 70% hearing came back. Did not have tinnitus    Pt has appointment with ENT and Audiology tommorow  ROS      Objective:    There were no vitals taken for this visit. {Vitals History (Optional):23777}  Physical Exam  No results found for any visits on 12/17/23.      Assessment & Plan:   Problem List Items Addressed This Visit   None   No orders of the defined types were placed in this encounter.   No follow-ups on file.  Shanekia Latella L Vernis Eid, NP

## 2023-12-17 ENCOUNTER — Ambulatory Visit (INDEPENDENT_AMBULATORY_CARE_PROVIDER_SITE_OTHER): Admitting: Student

## 2023-12-17 ENCOUNTER — Ambulatory Visit: Payer: Self-pay | Admitting: Student

## 2023-12-17 ENCOUNTER — Encounter: Payer: Self-pay | Admitting: Student

## 2023-12-17 VITALS — BP 124/76 | HR 75 | Temp 98.3°F | Ht 64.0 in | Wt 286.6 lb

## 2023-12-17 DIAGNOSIS — R519 Headache, unspecified: Secondary | ICD-10-CM | POA: Diagnosis not present

## 2023-12-17 DIAGNOSIS — G4489 Other headache syndrome: Secondary | ICD-10-CM | POA: Diagnosis not present

## 2023-12-17 DIAGNOSIS — H538 Other visual disturbances: Secondary | ICD-10-CM | POA: Diagnosis not present

## 2023-12-17 DIAGNOSIS — H903 Sensorineural hearing loss, bilateral: Secondary | ICD-10-CM | POA: Diagnosis not present

## 2023-12-17 DIAGNOSIS — K219 Gastro-esophageal reflux disease without esophagitis: Secondary | ICD-10-CM

## 2023-12-17 DIAGNOSIS — H9312 Tinnitus, left ear: Secondary | ICD-10-CM | POA: Diagnosis not present

## 2023-12-17 LAB — C-REACTIVE PROTEIN: CRP: <1 mg/dL (ref 0.5–20.0)

## 2023-12-17 LAB — COMPREHENSIVE METABOLIC PANEL WITH GFR
ALT: 11 U/L (ref 0–35)
AST: 12 U/L (ref 0–37)
Albumin: 3.9 g/dL (ref 3.5–5.2)
Alkaline Phosphatase: 79 U/L (ref 39–117)
BUN: 13 mg/dL (ref 6–23)
CO2: 31 meq/L (ref 19–32)
Calcium: 9.5 mg/dL (ref 8.4–10.5)
Chloride: 106 meq/L (ref 96–112)
Creatinine, Ser: 1 mg/dL (ref 0.40–1.20)
GFR: 56.81 mL/min — ABNORMAL LOW (ref >60.00)
Glucose, Bld: 99 mg/dL (ref 70–99)
Potassium: 4.8 meq/L (ref 3.5–5.1)
Sodium: 142 meq/L (ref 135–145)
Total Bilirubin: 0.7 mg/dL (ref 0.2–1.2)
Total Protein: 6.9 g/dL (ref 6.0–8.3)

## 2023-12-17 LAB — CBC WITH DIFFERENTIAL/PLATELET
Basophils Absolute: 0 K/uL (ref 0.0–0.1)
Basophils Relative: 0.4 % (ref 0.0–3.0)
Eosinophils Absolute: 0.6 K/uL (ref 0.0–0.7)
Eosinophils Relative: 6.3 % — ABNORMAL HIGH (ref 0.0–5.0)
HCT: 42.3 % (ref 36.0–46.0)
Hemoglobin: 13.5 g/dL (ref 12.0–15.0)
Lymphocytes Relative: 19.7 % (ref 12.0–46.0)
Lymphs Abs: 1.8 K/uL (ref 0.7–4.0)
MCHC: 32 g/dL (ref 30.0–36.0)
MCV: 96 fl (ref 78.0–100.0)
Monocytes Absolute: 1 K/uL (ref 0.1–1.0)
Monocytes Relative: 10.8 % (ref 3.0–12.0)
Neutro Abs: 5.7 K/uL (ref 1.4–7.7)
Neutrophils Relative %: 62.8 % (ref 43.0–77.0)
Platelets: 359 K/uL (ref 150.0–400.0)
RBC: 4.4 Mil/uL (ref 3.87–5.11)
RDW: 15.2 % (ref 11.5–15.5)
WBC: 9.1 K/uL (ref 4.0–10.5)

## 2023-12-17 LAB — VITAMIN D 25 HYDROXY (VIT D DEFICIENCY, FRACTURES): VITD: 30.36 ng/mL (ref 30.00–100.00)

## 2023-12-17 LAB — SEDIMENTATION RATE: Sed Rate: 6 mm/h (ref 0–30)

## 2023-12-17 LAB — VITAMIN B12: Vitamin B-12: 197 pg/mL — ABNORMAL LOW (ref 211–911)

## 2023-12-17 MED ORDER — FAMOTIDINE 40 MG PO TABS: 40.0000 mg | ORAL_TABLET | Freq: Every day | ORAL | 1 refills | Status: DC

## 2023-12-17 MED ORDER — PREDNISONE 20 MG PO TABS: 40.0000 mg | ORAL_TABLET | Freq: Every day | ORAL | 0 refills | Status: AC

## 2023-12-17 MED ORDER — OMEPRAZOLE MAGNESIUM 20 MG PO TBEC: 20.0000 mg | DELAYED_RELEASE_TABLET | Freq: Every day | ORAL | 1 refills | Status: DC

## 2023-12-17 NOTE — Assessment & Plan Note (Signed)
 R/O GCA. Rx- prednisone  40 mg daily x 10 days. Check ESR, CRP, CBC, CMP. Labs pending. Patient has an ENT appointment today with Dr. Maggie at [facility]. ENT office was contacted to coordinate a temporal artery biopsy and request further evaluation and guidance in ruling out GCA. Follow-up next week to reassess symptoms and review any findings.

## 2023-12-17 NOTE — Assessment & Plan Note (Addendum)
 Pt reports left temporal pain occurring over the past few months, accompanied by tenderness to palpation and intermittent episodes blurry vision in left eye.  Rx- prednisone  40 mg daily x 10 days. ESR and CRP- WNL Discussed temporal artery biopsy with ENT-Dr. Doran LIAS [facility] for R/O GCA. ENT recommends ophthalmology evaluation prior to proceeding with current symptoms. If ophthalmology confirms the need for biopsy, ENT is prepared to move forward. An urgent referral has been sent to ophthalmology for further assessment.     Follow-up next week to reassess symptoms.

## 2023-12-18 NOTE — Addendum Note (Signed)
 Addended by: WHEELER HARLENE CROME on: 12/18/2023 08:11 AM   Modules accepted: Orders

## 2023-12-21 ENCOUNTER — Other Ambulatory Visit: Payer: Self-pay | Admitting: Family Medicine

## 2023-12-21 DIAGNOSIS — R059 Cough, unspecified: Secondary | ICD-10-CM

## 2023-12-21 DIAGNOSIS — J309 Allergic rhinitis, unspecified: Secondary | ICD-10-CM

## 2023-12-24 NOTE — Assessment & Plan Note (Signed)
 Supplement and monitor

## 2023-12-24 NOTE — Assessment & Plan Note (Signed)
 Well controlled, no changes to meds. Encouraged heart healthy diet such as the DASH diet and exercise as tolerated.

## 2023-12-24 NOTE — Assessment & Plan Note (Signed)
 Encourage heart healthy diet such as MIND or DASH diet, increase exercise, avoid trans fats, simple carbohydrates and processed foods, consider a krill or fish or flaxseed oil cap daily.

## 2023-12-25 ENCOUNTER — Ambulatory Visit: Admitting: Family Medicine

## 2023-12-25 ENCOUNTER — Encounter: Payer: Self-pay | Admitting: Family Medicine

## 2023-12-25 VITALS — BP 138/76 | HR 75 | Ht 64.0 in | Wt 284.6 lb

## 2023-12-25 DIAGNOSIS — E559 Vitamin D deficiency, unspecified: Secondary | ICD-10-CM

## 2023-12-25 DIAGNOSIS — E782 Mixed hyperlipidemia: Secondary | ICD-10-CM | POA: Diagnosis not present

## 2023-12-25 DIAGNOSIS — E538 Deficiency of other specified B group vitamins: Secondary | ICD-10-CM

## 2023-12-25 DIAGNOSIS — I10 Essential (primary) hypertension: Secondary | ICD-10-CM | POA: Diagnosis not present

## 2023-12-25 DIAGNOSIS — H81399 Other peripheral vertigo, unspecified ear: Secondary | ICD-10-CM

## 2023-12-25 MED ORDER — TIRZEPATIDE-WEIGHT MANAGEMENT 2.5 MG/0.5ML ~~LOC~~ SOLN
2.5000 mg | SUBCUTANEOUS | 1 refills | Status: DC
Start: 2023-12-25 — End: 2023-12-31

## 2023-12-25 MED ORDER — AMLODIPINE-OLMESARTAN 10-40 MG PO TABS: 1.0000 | ORAL_TABLET | Freq: Every day | ORAL | 1 refills | Status: DC

## 2023-12-26 ENCOUNTER — Telehealth: Payer: Self-pay | Admitting: Family Medicine

## 2023-12-26 ENCOUNTER — Telehealth

## 2023-12-26 ENCOUNTER — Other Ambulatory Visit (HOSPITAL_COMMUNITY): Payer: Self-pay

## 2023-12-26 NOTE — Telephone Encounter (Signed)
 Pharmacy Patient Advocate Encounter   Received notification from CoverMyMeds that prior authorization for Zepbound  2.5 is required/requested.   Unable to verify insurance. Both eligibility checks and patient media show a [employer] card that test billing says is terminated.    Per test claim: unable to test bill or submit a Prior Authorization without insurance verification. Please advise.

## 2023-12-26 NOTE — Telephone Encounter (Signed)
 Returned patients call and she advised that her birthday for the [place] needs to be Dec 17, 1950, due to having 2 different birthdays/ birth certificates.

## 2023-12-26 NOTE — Telephone Encounter (Unsigned)
 Copied from CRM [id number]. Topic: General - Other >> Dec 26, 2023  9:58 AM Gennette ORN wrote: Reason for CRM: Patient is calling back to speak with Cherise she had a missed call. Her call back number is [phone].

## 2023-12-28 ENCOUNTER — Encounter: Payer: Self-pay | Admitting: Family Medicine

## 2023-12-28 NOTE — Progress Notes (Signed)
 Subjective:    [name], female    DOB: 09-05-1950, 73 y.o.   MRN: [id number]  Chief Complaint  Patient presents with   Medical Management of Chronic Issues    Patient presents today for a week follow-up.   Quality Metric Gaps    AWV, DEXA scan, pneumococcal    HPI Discussed the use of AI scribe software for clinical note transcription with the patient, who gave verbal consent to proceed.  History of Present Illness Terri [name] is a 73 year old female who presents with tinnitus, left-sided headache, and intermittent blurred vision.  She experiences constant tinnitus that escalates in intensity, described as 'really, really, really loud.' This is accompanied by left-sided headache pain, described as static-like and not sharp, but can become a dull ache lasting about 30 minutes. No falls or head injuries have occurred prior to the onset of these symptoms.  She also experiences intermittent blurred vision in her left eye, particularly when watching TV or driving. The blurred vision does not occur every time she has a headache.  She has a history of vertigo, described as a sensation of spinning or imbalance, particularly when moving or getting out of a car. This occurs more frequently on some days than others. She has not experienced any significant falls.  Her kidney function has shown fluctuations in creatinine levels, with a recent test showing improvement. She does not take medications for headaches and is unsure what causes the fluctuations.  She has been diagnosed with low B12 levels and plans to start taking 1000 micrograms of sublingual B12.  No fevers or chills. History of vertigo and occasional imbalance, particularly when moving quickly or getting out of a car.    Past Medical History:  Diagnosis Date   Allergic rhinitis    Apnea, sleep 06/28/2012   Previously used CPAP   Asthma    Atypical chest pain 01/14/2017   Back pain    Decreased visual  acuity 08/05/2016   Depression 06/19/2015   Environmental and seasonal allergies    GERD (gastroesophageal reflux disease)    Heartburn    Hip pain, right 06/28/2012   HTN (hypertension)    Hypokalemia 01/14/2017   Leg pain    Morbid obesity (HCC) 04/11/2008   Qualifier: Diagnosis of  By: Georgian DO, CHARM [place]    Numbness in feet 05/06/2016   Obesity    Osteoarthritis 01/16/2010   Qualifier: Diagnosis of  By: Georgian ROSALEA CHARM [place]    Preventative health care 06/25/2015   Shortness of breath    Shortness of breath on exertion    Sleep apnea    Swelling of extremity    Tinnitus    Vertigo     Past Surgical History:  Procedure Laterality Date   BIOPSY BREAST  1997   left    childbirth     COLONOSCOPY  2004   UPPER GASTROINTESTINAL ENDOSCOPY  03/01/2019    Family History  Problem Relation Age of Onset   Stroke Mother    Liver disease Mother        hepatitis b   Hypertension Mother    Cancer Father        esophagus, prostate   Hypertension Father    Heart disease Father    Esophageal cancer Father    Liver disease Brother        hep c   Mental illness Brother        PTSD from [place]  Stroke Maternal Grandmother    Cancer Paternal Grandfather        prostate   Colitis Neg Hx    Colon cancer Neg Hx    Rectal cancer Neg Hx    Stomach cancer Neg Hx     Social History   Socioeconomic History   Marital status: Divorced    Spouse name: Not on file   Number of children: 1   Years of education: Not on file   Highest education level: Not on file  Occupational History   Occupation: HS music teacher- Location manager   Occupation: retired    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 no dietary restrictions  Other Topics Concern   Not on file  Social History Narrative   Single/divorced   1 son born 73 - sometimes there w/ her, leukemia 2016   Music teacher [employer]   Caffeine use 1 cup AM   Never smoker/tobacco   No drugs   Rare-occ EtOHSmoking    Social Drivers of Health   Financial Resource Strain: Low Risk  (12/25/2022)   Overall Financial Resource Strain (CARDIA)    Difficulty of Paying Living Expenses: Not hard at all  Food Insecurity: No Food Insecurity (12/25/2022)   Hunger Vital Sign    Worried About Running Out of Food in the Last Year: Never true    Ran Out of Food in the Last Year: Never true  Transportation Needs: No Transportation Needs (12/25/2022)   PRAPARE - Administrator, Civil Service (Medical): No    Lack of Transportation (Non-Medical): No  Physical Activity: Inactive (12/25/2022)   Exercise Vital Sign    Days of Exercise per Week: 0 days    Minutes of Exercise per Session: 0 min  Stress: Stress Concern Present (12/25/2022)   [employer] of Occupational Health - Occupational Stress Questionnaire    Feeling of Stress : To some extent  Social Connections: Moderately Integrated (12/25/2022)   Social Connection and Isolation Panel    Frequency of Communication with Friends and Family: More than three times a week    Frequency of Social Gatherings with Friends and Family: More than three times a week    Attends Religious Services: More than 4 times per year    Active Member of [employer] or Organizations: Yes    Attends Banker Meetings: More than 4 times per year    Marital Status: Divorced  Intimate Partner Violence: Not At Risk (12/25/2022)   Humiliation, Afraid, Rape, and Kick questionnaire    Fear of Current or Ex-Partner: No    Emotionally Abused: No    Physically Abused: No    Sexually Abused: No    Outpatient Medications Prior to Visit  Medication Sig Dispense Refill   albuterol   (VENTOLIN  HFA) 108 (90 Base) MCG/ACT inhaler INHALE 1-2 PUFFS BY MOUTH EVERY 6 HOURS AS NEEDED FOR WHEEZE OR SHORTNESS OF BREATH 8.5 each 5   budesonide -formoterol  (SYMBICORT ) 80-4.5 MCG/ACT inhaler Inhale 2 puffs into the lungs See admin instructions. Twice monthly 1 each    dexlansoprazole  (DEXILANT ) 60 MG capsule Take 1 capsule (  60 mg total) by mouth daily. 90 capsule 1   EPINEPHrine 0.3 mg/0.3 mL IJ SOAJ injection Inject 1 Dose into the muscle as needed for allergies. Inject after allergy shot for anaphylaxis     famotidine  (PEPCID ) 40 MG tablet Take 1 tablet (40 mg total) by mouth daily. 90 tablet 1   levocetirizine (XYZAL) 5 MG tablet Take 5 mg by mouth every evening.     montelukast  (SINGULAIR ) 10 MG tablet Take 1 tablet (10 mg total) by mouth at bedtime. 90 tablet 1   MYRBETRIQ 50 MG TB24 tablet Take 50 mg by mouth daily.     omeprazole  (PRILOSEC  OTC) 20 MG tablet Take 1 tablet (20 mg total) by mouth daily. 90 tablet 1   predniSONE  (DELTASONE ) 20 MG tablet Take 2 tablets (40 mg total) by mouth daily with breakfast for 10 days. 20 tablet 0   Vitamin D , Ergocalciferol , (DRISDOL ) 1.25 MG (50000 UNIT) CAPS capsule TAKE 1 CAPSULE (50,000 UNITS TOTAL) BY MOUTH EVERY 7 (SEVEN) DAYS 5 capsule 4   amLODipine -olmesartan  (AZOR ) 10-40 MG tablet Take 1 tablet by mouth daily. 90 tablet 1   Semaglutide -Weight Management (WEGOVY ) 0.25 MG/0.5ML SOAJ Inject 0.25 mg into the skin once a week. (Patient not taking: Reported on 12/25/2023) 2 mL 2   No facility-administered medications prior to visit.    Allergies  Allergen Reactions   Sulfamethoxazole-Trimethoprim     Other reaction(s): Unknown   Sulfonamide Derivatives    Sulfa Antibiotics Rash    Review of Systems  Constitutional:  Positive for malaise/fatigue. Negative for fever.  HENT:  Positive for tinnitus. Negative for congestion.   Eyes:  Positive for blurred vision.  Respiratory:  Negative for shortness of breath.   Cardiovascular:  Negative  for chest pain, palpitations and leg swelling.  Gastrointestinal:  Negative for abdominal pain, blood in stool and nausea.  Genitourinary:  Negative for dysuria and frequency.  Musculoskeletal:  Negative for falls.  Skin:  Negative for rash.  Neurological:  Positive for dizziness and headaches. Negative for loss of consciousness.  Endo/Heme/Allergies:  Negative for environmental allergies.  Psychiatric/Behavioral:  Negative for depression. The patient is not nervous/anxious.        Objective:    Physical Exam Constitutional:      General: She is not in acute distress.    Appearance: Normal appearance. She is well-developed. She is not toxic-appearing.  HENT:     Head: Normocephalic and atraumatic.     Right Ear: External ear normal.     Left Ear: External ear normal.     Nose: Nose normal.  Eyes:     General:        Right eye: No discharge.        Left eye: No discharge.     Conjunctiva/sclera: Conjunctivae normal.  Neck:     Thyroid : No thyromegaly.  Cardiovascular:     Rate and Rhythm: Normal rate and regular rhythm.     Heart sounds: Normal heart sounds. No murmur heard. Pulmonary:     Effort: Pulmonary effort is normal. No respiratory distress.     Breath sounds: Normal breath sounds.  Abdominal:     General: Bowel sounds are normal.     Palpations: Abdomen is soft.     Tenderness: There is no abdominal tenderness. There is no guarding.  Musculoskeletal:        General: Normal range of motion.     Cervical back: Neck supple.  Lymphadenopathy:     Cervical: No  cervical adenopathy.  Skin:    General: Skin is warm and dry.  Neurological:     Mental Status: She is alert and oriented to person, place, and time.  Psychiatric:        Mood and Affect: Mood normal.        Behavior: Behavior normal.        Thought Content: Thought content normal.        Judgment: Judgment normal.     BP 138/76   Pulse 75   Ht 5' 4 (1.626 m)   Wt 284 lb 9.6 oz (129.1 kg)   SpO2  93%   BMI 48.85 kg/m  Wt Readings from Last 3 Encounters:  12/25/23 284 lb 9.6 oz (129.1 kg)  12/17/23 286 lb 9.6 oz (130 kg)  08/05/23 283 lb 6.4 oz (128.5 kg)    Diabetic Foot Exam - Simple   No data filed    Lab Results  Component Value Date   WBC 9.1 12/17/2023   HGB 13.5 12/17/2023   HCT 42.3 12/17/2023   PLT 359.0 12/17/2023   GLUCOSE 99 12/17/2023   CHOL 141 09/05/2023   TRIG 81 09/05/2023   HDL 43 09/05/2023   LDLCALC 82 09/05/2023   ALT 11 12/17/2023   AST 12 12/17/2023   NA 142 12/17/2023   K 4.8 12/17/2023   CL 106 12/17/2023   CREATININE 1.00 12/17/2023   BUN 13 12/17/2023   CO2 31 12/17/2023   TSH 2.11 09/05/2023   HGBA1C 5.6 09/05/2023    Lab Results  Component Value Date   TSH 2.11 09/05/2023   Lab Results  Component Value Date   WBC 9.1 12/17/2023   HGB 13.5 12/17/2023   HCT 42.3 12/17/2023   MCV 96.0 12/17/2023   PLT 359.0 12/17/2023   Lab Results  Component Value Date   NA 142 12/17/2023   K 4.8 12/17/2023   CO2 31 12/17/2023   GLUCOSE 99 12/17/2023   BUN 13 12/17/2023   CREATININE 1.00 12/17/2023   BILITOT 0.7 12/17/2023   ALKPHOS 79 12/17/2023   AST 12 12/17/2023   ALT 11 12/17/2023   PROT 6.9 12/17/2023   ALBUMIN 3.9 12/17/2023   CALCIUM 9.5 12/17/2023   ANIONGAP 7 03/19/2019   EGFR 55 09/04/2023   GFR 56.81 (L) 12/17/2023   Lab Results  Component Value Date   CHOL 141 09/05/2023   Lab Results  Component Value Date   HDL 43 09/05/2023   Lab Results  Component Value Date   LDLCALC 82 09/05/2023   Lab Results  Component Value Date   TRIG 81 09/05/2023   Lab Results  Component Value Date   CHOLHDL 3 11/05/2022   Lab Results  Component Value Date   HGBA1C 5.6 09/05/2023       Assessment & Plan:  Essential hypertension Assessment & Plan: Well controlled, no changes to meds. Encouraged heart healthy diet such as the DASH diet and exercise as tolerated.   Orders: -     amLODIPine -Olmesartan ; Take 1 tablet  by mouth daily.  Dispense: 90 tablet; Refill: 1 -     COMPLETE METABOLIC PANEL WITHOUT GFR; Future  Hyperlipidemia, mixed Assessment & Plan: Encourage heart healthy diet such as MIND or DASH diet, increase exercise, avoid trans fats, simple carbohydrates and processed foods, consider a krill or fish or flaxseed oil cap daily.    Vitamin D  deficiency Assessment & Plan: Supplement and monitor    Other peripheral vertigo, unspecified ear -  CT HEAD WO CONTRAST ( ); Future  Vitamin B12 deficiency -     CBC with Differential/Platelet; Future -     Vitamin B12; Future -     Intrinsic Factor Antibodies; Future  Morbid obesity (HCC) -     AMB Referral VBCI Care Management  Other orders -     Tirzepatide -Weight Management; Inject 2.5 mg into the skin once a week.  Dispense: 2 mL; Refill: 1    Assessment and Plan Assessment & Plan Left-sided headache with tinnitus and blurred vision Intermittent left-sided headaches with tinnitus and occasional blurred vision. ENT advised against prednisone  until after eye examination to rule out temporal arteritis and retinal issues. Imaging considered due to symptoms. - Order CT scan of the head. - Proceed with scheduled eye examination to rule out temporal arteritis and retinal issues.  Benign Paroxysmal Positional Vertigo (BPPV) Experiences brief vertigo episodes, especially with movement. Management includes hydration and careful movements. - Encourage adequate hydration, especially in hot weather.  Chronic Kidney Disease (CKD) Creatinine levels fluctuating, recent improvement noted. GFR below 60, requires monitoring. Emphasized hydration and avoiding nephrotoxic substances. - Monitor kidney function with periodic metabolic panels. - Encourage hydration and avoidance of nephrotoxic medications.  Vitamin B12 deficiency Low B12 levels, not on supplements. Starting sublingual B12 recommended, with plans to recheck levels in a month. -  Start 1000 mcg sublingual B12 daily. - Recheck B12 levels and intrinsic factor in one month.  Obesity Considering weight loss options, informed about Zepbound  benefits. Discussed lifestyle changes to enhance medication effectiveness. Scheduled nutritionist consultation. - Refer to pharmacist for Zepbound  prescription at 2.5 mg. - Encourage dietary modifications and regular physical activity. - Consult with a nutritionist for dietary guidance.  General Health Maintenance Emphasized healthy lifestyle, including hydration, diet, and exercise, to support overall health. - Encourage regular physical activity, aiming for at least 2000 steps daily. - Advise on a balanced diet with adequate hydration.  Follow-up Upcoming appointments and tests to monitor health conditions. Lab work planned around August 15th. Coordination with pharmacist for Zepbound  initiation post-cruise. - Schedule follow-up appointment in 3-4 months. - Plan for lab work, including metabolic panel and B12 levels, around August 15th. - Coordinate with pharmacist for Zepbound  initiation post-cruise.     Harlene Horton, MD

## 2023-12-30 ENCOUNTER — Other Ambulatory Visit (HOSPITAL_COMMUNITY): Payer: Self-pay

## 2023-12-30 NOTE — Telephone Encounter (Signed)
 Tried to resubmit. From Cover My Meds

## 2023-12-30 NOTE — Telephone Encounter (Signed)
10/12/17? 

## 2023-12-31 ENCOUNTER — Other Ambulatory Visit (HOSPITAL_COMMUNITY): Payer: Self-pay

## 2023-12-31 ENCOUNTER — Other Ambulatory Visit: Payer: Self-pay | Admitting: Family Medicine

## 2023-12-31 ENCOUNTER — Telehealth: Payer: Self-pay

## 2023-12-31 DIAGNOSIS — H40013 Open angle with borderline findings, low risk, bilateral: Secondary | ICD-10-CM | POA: Diagnosis not present

## 2023-12-31 DIAGNOSIS — R519 Headache, unspecified: Secondary | ICD-10-CM | POA: Diagnosis not present

## 2023-12-31 DIAGNOSIS — G4733 Obstructive sleep apnea (adult) (pediatric): Secondary | ICD-10-CM

## 2023-12-31 DIAGNOSIS — H524 Presbyopia: Secondary | ICD-10-CM | POA: Diagnosis not present

## 2023-12-31 DIAGNOSIS — H43822 Vitreomacular adhesion, left eye: Secondary | ICD-10-CM | POA: Diagnosis not present

## 2023-12-31 DIAGNOSIS — H35362 Drusen (degenerative) of macula, left eye: Secondary | ICD-10-CM | POA: Diagnosis not present

## 2023-12-31 DIAGNOSIS — H25813 Combined forms of age-related cataract, bilateral: Secondary | ICD-10-CM | POA: Diagnosis not present

## 2023-12-31 MED ORDER — TIRZEPATIDE-WEIGHT MANAGEMENT 2.5 MG/0.5ML ~~LOC~~ SOAJ: 2.5000 mg | SUBCUTANEOUS | 1 refills | Status: DC

## 2023-12-31 NOTE — Telephone Encounter (Signed)
 Tried to resubmit making sure I have the correct DOB  Still getting the same message

## 2023-12-31 NOTE — Telephone Encounter (Signed)
 A user error has taken place: orders placed in error, not carried out on this patient.

## 2023-12-31 NOTE — Progress Notes (Signed)
 Complex Care Management Note Care Guide Note  12/31/2023 Name: Terri [name] MRN: [id number] DOB: 08-21-1950   Complex Care Management Outreach Attempts: An unsuccessful telephone outreach was attempted today to offer the patient information about available complex care management services.  Follow Up Plan:  Additional outreach attempts will be made to offer the patient complex care management information and services.   Encounter Outcome:  No Answer  Dreama [facility]  [facility] Health Care Management Assistant Direct Dial: [phone]  Fax: [phone]

## 2023-12-31 NOTE — Telephone Encounter (Signed)
 Spoke with patient and she going to reach back out to her insurance company.

## 2024-01-02 ENCOUNTER — Ambulatory Visit (HOSPITAL_BASED_OUTPATIENT_CLINIC_OR_DEPARTMENT_OTHER)
Admission: RE | Admit: 2024-01-02 | Discharge: 2024-01-02 | Disposition: A | Discharge status: Home / Self Care | Source: Ambulatory Visit | Attending: Family Medicine | Admitting: Family Medicine

## 2024-01-02 DIAGNOSIS — H9319 Tinnitus, unspecified ear: Secondary | ICD-10-CM | POA: Diagnosis not present

## 2024-01-02 DIAGNOSIS — H81399 Other peripheral vertigo, unspecified ear: Secondary | ICD-10-CM | POA: Insufficient documentation

## 2024-01-02 DIAGNOSIS — R42 Dizziness and giddiness: Secondary | ICD-10-CM | POA: Diagnosis not present

## 2024-01-02 DIAGNOSIS — R519 Headache, unspecified: Secondary | ICD-10-CM | POA: Insufficient documentation

## 2024-01-08 ENCOUNTER — Ambulatory Visit: Admitting: *Deleted

## 2024-01-08 VITALS — Ht 64.0 in | Wt 284.0 lb

## 2024-01-08 DIAGNOSIS — Z0000 Encounter for general adult medical examination without abnormal findings: Secondary | ICD-10-CM

## 2024-01-08 DIAGNOSIS — M8580 Other specified disorders of bone density and structure, unspecified site: Secondary | ICD-10-CM

## 2024-01-08 NOTE — Progress Notes (Signed)
 Please attest this visit in the absence of patient primary care provider.    Subjective:   Terri [name] is a 73 y.o. who presents for a Medicare Wellness preventive visit.  As a reminder, Annual Wellness Visits don't include a physical exam, and some assessments may be limited, especially if this visit is performed virtually. We may recommend an in-person follow-up visit with your provider if needed.  Visit Complete: Virtual I connected with  Terri [name] on 01/08/24 by a audio enabled telemedicine application and verified that I am speaking with the correct person using two identifiers.  Patient Location: Home  Provider Location: Office/Clinic  I discussed the limitations of evaluation and management by telemedicine. The patient expressed understanding and agreed to proceed.  Vital Signs: Because this visit was a virtual/telehealth visit, some criteria may be missing or patient reported. Any vitals not documented were not able to be obtained and vitals that have been documented are patient reported.  VideoDeclined- This patient declined Librarian, academic. Therefore the visit was completed with audio only.  Persons Participating in Visit: Patient.  AWV Questionnaire: No: Patient Medicare AWV questionnaire was not completed prior to this visit.  Cardiac Risk Factors include: advanced age (>1men, >38 women);obesity (BMI >30kg/m2);hypertension;dyslipidemia;Other (see comment), Risk factor comments: OSA     Objective:    Today's Vitals   01/08/24 1503  Weight: 284 lb (128.8 kg)  Height: 5' 4 (1.626 m)   Body mass index is 48.75 kg/m.     08/21/2023    4:25 PM 06/25/2023   12:10 PM 12/25/2022    4:20 PM 07/18/2022    6:20 PM 06/12/2021    1:09 PM 05/04/2021    8:06 AM  Advanced Directives  Does Patient Have a Medical Advance Directive? No No No No No No  Would patient like information on creating a medical advance directive? No - Patient  declined  Yes (MAU/Ambulatory/Procedural Areas - Information given)  No - Patient declined No - Patient declined    Current Medications (verified) Outpatient Encounter Medications as of 01/08/2024  Medication Sig   albuterol  (VENTOLIN  HFA) 108 (90 Base) MCG/ACT inhaler INHALE 1-2 PUFFS BY MOUTH EVERY 6 HOURS AS NEEDED FOR WHEEZE OR SHORTNESS OF BREATH   amLODipine -olmesartan  (AZOR ) 10-40 MG tablet Take 1 tablet by mouth daily.   budesonide -formoterol  (SYMBICORT ) 80-4.5 MCG/ACT inhaler Inhale 2 puffs into the lungs See admin instructions. Twice monthly   dexlansoprazole  (DEXILANT ) 60 MG capsule Take 1 capsule (60 mg total) by mouth daily.   EPINEPHrine 0.3 mg/0.3 mL IJ SOAJ injection Inject 1 Dose into the muscle as needed for allergies. Inject after allergy shot for anaphylaxis   famotidine  (PEPCID ) 40 MG tablet Take 1 tablet (40 mg total) by mouth daily.   levocetirizine (XYZAL) 5 MG tablet Take 5 mg by mouth every evening.   montelukast  (SINGULAIR ) 10 MG tablet Take 1 tablet (10 mg total) by mouth at bedtime.   MYRBETRIQ 50 MG TB24 tablet Take 50 mg by mouth daily.   omeprazole  (PRILOSEC  OTC) 20 MG tablet Take 1 tablet (20 mg total) by mouth daily.   tirzepatide  (ZEPBOUND ) 2.5 MG/0.5ML Pen Inject 2.5 mg into the skin once a week.   Vitamin D , Ergocalciferol , (DRISDOL ) 1.25 MG (50000 UNIT) CAPS capsule TAKE 1 CAPSULE (50,000 UNITS TOTAL) BY MOUTH EVERY 7 (SEVEN) DAYS   No facility-administered encounter medications on file as of 01/08/2024.    Allergies (verified) Sulfamethoxazole-trimethoprim, Sulfonamide derivatives, and Sulfa antibiotics   History:  Past Medical History:  Diagnosis Date   Allergic rhinitis    Apnea, sleep 06/28/2012   Previously used CPAP   Asthma    Atypical chest pain 01/14/2017   Back pain    Decreased visual acuity 08/05/2016   Depression 06/19/2015   Environmental and seasonal allergies    GERD (gastroesophageal reflux disease)    Heartburn    Hip pain, right  06/28/2012   HTN (hypertension)    Hypokalemia 01/14/2017   Leg pain    Morbid obesity (HCC) 04/11/2008   Qualifier: Diagnosis of  By: Georgian ROSALEA CHARM Lamar    Numbness in feet 05/06/2016   Obesity    Osteoarthritis 01/16/2010   Qualifier: Diagnosis of  By: Georgian ROSALEA CHARM Lamar    Preventative health care 06/25/2015   Shortness of breath    Shortness of breath on exertion    Sleep apnea    Swelling of extremity    Tinnitus    Vertigo    Past Surgical History:  Procedure Laterality Date   BIOPSY BREAST  1997   left    childbirth     COLONOSCOPY  2004   UPPER GASTROINTESTINAL ENDOSCOPY  03/01/2019   Family History  Problem Relation Age of Onset   Stroke Mother    Liver disease Mother        hepatitis b   Hypertension Mother    Cancer Father        esophagus, prostate   Hypertension Father    Heart disease Father    Esophageal cancer Father    Liver disease Brother        hep c   Mental illness Brother        PTSD from [place]   Stroke Maternal Grandmother    Cancer Paternal Grandfather        prostate   Colitis Neg Hx    Colon cancer Neg Hx    Rectal cancer Neg Hx    Stomach cancer Neg Hx    Social History   Socioeconomic History   Marital status: Divorced    Spouse name: Not on file   Number of children: 1   Years of education: Not on file   Highest education level: Not on file  Occupational History   Occupation: HS music teacher- Location manager   Occupation: retired    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 no dietary restrictions  Other Topics Concern   Not on file  Social History Narrative   Single/divorced   1 son born 13 - sometimes there w/ her,  leukemia 2016   Music teacher [employer]   Caffeine use 1 cup AM   Never smoker/tobacco   No drugs   Rare-occ EtOHSmoking    Social Drivers of Health   Financial Resource Strain: Low Risk  (01/08/2024)   Overall Financial Resource Strain (CARDIA)    Difficulty of Paying Living Expenses: Not hard at all  Food Insecurity: No Food Insecurity (01/08/2024)   Hunger Vital Sign    Worried About Running Out of Food in the Last Year: Never true    Ran Out  of Food in the Last Year: Never true  Transportation Needs: No Transportation Needs (01/08/2024)   PRAPARE - Administrator, Civil Service (Medical): No    Lack of Transportation (Non-Medical): No  Physical Activity: Inactive (01/08/2024)   Exercise Vital Sign    Days of Exercise per Week: 0 days    Minutes of Exercise per Session: 0 min  Stress: No Stress Concern Present (01/08/2024)   [employer] of Occupational Health - Occupational Stress Questionnaire    Feeling of Stress: Only a little  Social Connections: Moderately Integrated (01/08/2024)   Social Connection and Isolation Panel    Frequency of Communication with Friends and Family: More than three times a week    Frequency of Social Gatherings with Friends and Family: More than three times a week    Attends Religious Services: More than 4 times per year    Active Member of [employer] or Organizations: Yes    Attends Banker Meetings: More than 4 times per year    Marital Status: Widowed    Tobacco Counseling Counseling given: Not Answered    Clinical Intake:  Pre-visit preparation completed: Yes  Pain : No/denies pain     BMI - recorded: 48.75 Nutritional Status: BMI > 30  Obese Nutritional Risks: None Diabetes: No  Lab Results  Component Value Date   HGBA1C 5.6 09/05/2023   HGBA1C 5.6 11/05/2022   HGBA1C 5.5 08/01/2022     How often do you need to have someone help you when you read instructions, pamphlets, or other written materials  from your doctor or pharmacy?: 1 - Never What is the last grade level you completed in school?: college  Interpreter Needed?: No  Information entered by :: Lolita Libra, CMA   Activities of Daily Living     01/08/2024    3:20 PM  In your present state of health, do you have any difficulty performing the following activities:  Hearing? 1  Vision? 0  Difficulty concentrating or making decisions? 0  Walking or climbing stairs? 1  Comment Has OA  Dressing or bathing? 0  Doing errands, shopping? 0  Preparing Food and eating ? N  Using the Toilet? N  In the past six months, have you accidently leaked urine? N  Do you have problems with loss of bowel control? N  Managing your Medications? N  Managing your Finances? N  Housekeeping or managing your Housekeeping? N    Patient Care Team: Domenica Harlene LABOR, MD as PCP - General (Family Medicine) Rox [place], MD as Consulting Physician (Obstetrics and Gynecology) Fleeta Smock, [place] BROCKS, MD as Consulting Physician (Allergy and Immunology) [facility], [facility] (Ophthalmology)  I have updated your Care Teams any recent Medical Services you may have received from other providers in the past year.     Assessment:   This is a routine wellness examination for Terri [name].  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  Vision Screening - Comments:: Wears RX glasses -- up to date with routine eye exams.    Goals Addressed               This Visit's Progress     Patient Stated (pt-stated)        She wants to restart water aerobics to condition for her trip to [place] in the next year.       Depression Screen     01/08/2024    3:16 PM  12/25/2022    4:18 PM 11/05/2022    1:06 PM 08/01/2022   11:32 AM 06/12/2021    1:14 PM 11/28/2020   10:05 AM 08/16/2019   10:25 AM  PHQ 2/9 Scores  PHQ - 2 Score 1 0 0 0 0 0 0  PHQ- 9 Score 1 0 0 0   1    Fall Risk     01/08/2024     3:10 PM 12/25/2022    4:21 PM 11/05/2022    1:06 PM 08/01/2022   11:31 AM 03/07/2022    3:16 PM  Fall Risk   Falls in the past year? 0 1 0 1 0  Number falls in past yr: 0 0 0 0 0  Injury with Fall? 0 0 0 0 0  Risk for fall due to : Impaired mobility Impaired mobility     Risk for fall due to: Comment has OA      Follow up Education provided Falls prevention discussed Falls evaluation completed Falls evaluation completed Falls evaluation completed      Data saved with a previous flowsheet row definition    MEDICARE RISK AT HOME:  Medicare Risk at Home Any stairs in or around the home?: No If so, are there any without handrails?: No Home free of loose throw rugs in walkways, pet beds, electrical cords, etc?: Yes Adequate lighting in your home to reduce risk of falls?: Yes Life alert?: No Use of a cane, walker or w/c?: No Grab bars in the bathroom?: No Shower chair or bench in shower?: No Elevated toilet seat or a handicapped toilet?: Yes  TIMED UP AND GO:  Was the test performed?  No,audio  Cognitive Function: 6CIT completed        12/25/2022    4:22 PM  6CIT Screen  What Year? 0 points  What month? 0 points  What time? 0 points  Count back from 20 0 points  Months in reverse 0 points  Repeat phrase 0 points  Total Score 0 points    Immunizations Immunization History  Administered Date(s) Administered   PFIZER Comirnaty (Gray Top)Covid-19 Tri-Sucrose Vaccine 07/18/2020, 08/18/2020   Td 11/29/2008   Zoster Recombinant(Shingrix) 12/01/2023    Screening Tests Health Maintenance  Topic Date Due   DEXA SCAN  07/20/2023   Medicare Annual Wellness (AWV)  12/25/2023   COVID-19 Vaccine (3 - 2024-25 season) 06/02/2024 (Originally 02/02/2023)   INFLUENZA VACCINE  08/31/2024 (Originally 01/02/2024)   Pneumococcal Vaccine: 50+ Years (1 of 2 - PCV) 01/07/2025 (Originally 10/14/1969)   Zoster Vaccines- Shingrix (2 of 2) 01/26/2024   MAMMOGRAM  09/03/2024   Colonoscopy  02/28/2029    Hepatitis C Screening  Completed   Hepatitis B Vaccines  Aged Out   HPV VACCINES  Aged Out   Meningococcal B Vaccine  Aged Out   DTaP/Tdap/Td  Discontinued    Health Maintenance  Health Maintenance Due  Topic Date Due   DEXA SCAN  07/20/2023   Medicare Annual Wellness (AWV)  12/25/2023   Health Maintenance Items Addressed: Bone Density ordered. Pt declines flu and pneumonia vaccines. All other HM up to date.  Additional Screening:  Vision Screening: Recommended annual ophthalmology exams for early detection of glaucoma and other disorders of the eye. Would you like a referral to an eye doctor? No    Dental Screening: Recommended annual dental exams for proper oral hygiene  Community Resource Referral / Chronic Care Management: CRR required this visit?  No   CCM required this visit?  No   Plan:    I have personally reviewed and noted the following in the patient's chart:   Medical and social history Use of alcohol, tobacco or illicit drugs  Current medications and supplements including opioid prescriptions. Patient is not currently taking opioid prescriptions. Functional ability and status Nutritional status Physical activity Advanced directives List of other physicians Hospitalizations, surgeries, and ER visits in previous 12 months Vitals Screenings to include cognitive, depression, and falls Referrals and appointments  In addition, I have reviewed and discussed with patient certain preventive protocols, quality metrics, and best practice recommendations. A written personalized care plan for preventive services as well as general preventive health recommendations were provided to patient.   Lolita Libra, CMA   01/08/2024   After Visit Summary: (MyChart) Due to this being a telephonic visit, the after visit summary with patients personalized plan was offered to patient via MyChart   Notes: Nothing significant to report at this time.

## 2024-01-08 NOTE — Patient Instructions (Addendum)
 [name] , Thank you for taking time out of your busy schedule to complete your Annual Wellness Visit with me. I enjoyed our conversation and look forward to speaking with you again next year. I, as well as your care team,  appreciate your ongoing commitment to your health goals. Please review the following plan we discussed and let me know if I can assist you in the future.  Your Game plan/ To Do List    Enjoy your [employer]!  Referrals: If you haven't heard from the office you've been referred to, please reach out to them at the phone provided.   Bone Density: [phone] (Imaging @ [facility])  Follow up Visits: Next Medicare AWV with our clinical staff: 01/11/25 3pm    Next Office Visit with your provider: 05/03/24 9am  Clinician Recommendations:  Aim for 30 minutes of exercise or brisk walking, 6-8 glasses of water, and 5 servings of fruits and vegetables each day.       This is a list of the screening recommended for you and due dates:  Health Maintenance  Topic Date Due   DEXA scan (bone density measurement)  07/20/2023   Medicare Annual Wellness Visit  12/25/2023   COVID-19 Vaccine (3 - 2024-25 season) 06/02/2024*   Flu Shot  08/31/2024*   Pneumococcal Vaccine for age over 67 (1 of 2 - PCV) 01/07/2025*   Zoster (Shingles) Vaccine (2 of 2) 01/26/2024   Mammogram  09/03/2024   Colon Cancer Screening  02/28/2029   Hepatitis C Screening  Completed   Hepatitis B Vaccine  Aged Out   HPV Vaccine  Aged Out   Meningitis B Vaccine  Aged Out   DTaP/Tdap/Td vaccine  Discontinued  *Topic was postponed. The date shown is not the original due date.    Advanced directives: (Copy Requested) Please bring a copy of your health care power of attorney and living will to the office to be added to your chart at your convenience. You can mail to [facility] [address] [place], [place] [address] or email to [email or web address] Advance Care Planning  is important because it:  [x]  Makes sure you receive the medical care that is consistent with your values, goals, and preferences  [x]  It provides guidance to your family and loved ones and reduces their decisional burden about whether or not they are making the right decisions based on your wishes.  Follow the link provided in your after visit summary or read over the paperwork we have mailed to you to help you started getting your Advance Directives in place. If you need assistance in completing these, please reach out to us  so that we can help you!  See attachments for Preventive Care and Fall Prevention Tips.

## 2024-01-09 ENCOUNTER — Ambulatory Visit: Payer: Self-pay | Admitting: Family

## 2024-01-12 NOTE — Progress Notes (Signed)
 Patient reviewed via MyChart.

## 2024-01-15 NOTE — Progress Notes (Signed)
 Complex Care Management Note Care Guide Note  01/15/2024 Name: Terri [name] MRN: [id number] DOB: 11-03-1950   Complex Care Management Outreach Attempts: A second unsuccessful outreach was attempted today to offer the patient with information about available complex care management services.  Follow Up Plan:  Additional outreach attempts will be made to offer the patient complex care management information and services.   Encounter Outcome:  No Answer  Dreama [facility]  [facility], [facility] Health Care Management Assistant Direct Dial: [phone]  Fax: [phone]

## 2024-01-16 ENCOUNTER — Other Ambulatory Visit (INDEPENDENT_AMBULATORY_CARE_PROVIDER_SITE_OTHER)

## 2024-01-16 DIAGNOSIS — E538 Deficiency of other specified B group vitamins: Secondary | ICD-10-CM | POA: Diagnosis not present

## 2024-01-16 DIAGNOSIS — I10 Essential (primary) hypertension: Secondary | ICD-10-CM | POA: Diagnosis not present

## 2024-01-16 LAB — CBC WITH DIFFERENTIAL/PLATELET
Basophils Absolute: 0 K/uL (ref 0.0–0.1)
Basophils Relative: 0.5 % (ref 0.0–3.0)
Eosinophils Absolute: 0.6 K/uL (ref 0.0–0.7)
Eosinophils Relative: 7.1 % — ABNORMAL HIGH (ref 0.0–5.0)
HCT: 40.2 % (ref 36.0–46.0)
Hemoglobin: 12.9 g/dL (ref 12.0–15.0)
Lymphocytes Relative: 23.6 % (ref 12.0–46.0)
Lymphs Abs: 1.9 K/uL (ref 0.7–4.0)
MCHC: 32.2 g/dL (ref 30.0–36.0)
MCV: 95.7 fl (ref 78.0–100.0)
Monocytes Absolute: 0.8 K/uL (ref 0.1–1.0)
Monocytes Relative: 10.2 % (ref 3.0–12.0)
Neutro Abs: 4.7 K/uL (ref 1.4–7.7)
Neutrophils Relative %: 58.6 % (ref 43.0–77.0)
Platelets: 332 K/uL (ref 150.0–400.0)
RBC: 4.2 Mil/uL (ref 3.87–5.11)
RDW: 14.8 % (ref 11.5–15.5)
WBC: 8 K/uL (ref 4.0–10.5)

## 2024-01-16 LAB — VITAMIN B12: Vitamin B-12: 606 pg/mL (ref 211–911)

## 2024-01-16 NOTE — Progress Notes (Signed)
 Complex Care Management Note  Care Guide Note 01/16/2024 Name: Terri [name] MRN: [id number] DOB: [birth year]  Terri [name] is a 73 y.o. year old female who sees Domenica Harlene LABOR, MD for primary care. I reached out to Sharlet FORBES Rubenstein by phone today to offer complex care management services.  [name] was given information about Complex Care Management services today including:   The Complex Care Management services include support from the care team which includes your Nurse Care Manager, Clinical Social Worker, or Pharmacist.  The Complex Care Management team is here to help remove barriers to the health concerns and goals most important to you. Complex Care Management services are voluntary, and the patient may decline or stop services at any time by request to their care team member.   Complex Care Management Consent Status: Patient did not agree to participate in complex care management services at this time.  Follow up plan:  Patient will follow up with PCP.  Encounter Outcome:  Patient Refused  Dreama Agent [facility], [facility] Health Care Management Assistant Direct Dial: [phone]  Fax: [phone]

## 2024-01-19 LAB — COMPLETE METABOLIC PANEL WITHOUT GFR
AG Ratio: 1.3 (calc) (ref 1.0–2.5)
ALT: 12 U/L (ref 6–29)
AST: 12 U/L (ref 10–35)
Albumin: 3.7 g/dL (ref 3.6–5.1)
Alkaline phosphatase (APISO): 74 U/L (ref 37–153)
BUN/Creatinine Ratio: 14 (calc) (ref 6–22)
BUN: 14 mg/dL (ref 7–25)
CO2: 28 mmol/L (ref 20–32)
Calcium: 9.1 mg/dL (ref 8.6–10.4)
Chloride: 107 mmol/L (ref 98–110)
Creat: 1.01 mg/dL — ABNORMAL HIGH (ref 0.60–1.00)
Globulin: 2.8 g/dL (ref 1.9–3.7)
Glucose, Bld: 102 mg/dL — ABNORMAL HIGH (ref 65–99)
Potassium: 4.2 mmol/L (ref 3.5–5.3)
Sodium: 143 mmol/L (ref 135–146)
Total Bilirubin: 0.3 mg/dL (ref 0.2–1.2)
Total Protein: 6.5 g/dL (ref 6.1–8.1)

## 2024-01-19 LAB — INTRINSIC FACTOR ANTIBODIES: Intrinsic Factor: NEGATIVE

## 2024-02-04 ENCOUNTER — Ambulatory Visit: Attending: Cardiology | Admitting: Cardiology

## 2024-02-04 ENCOUNTER — Encounter: Payer: Self-pay | Admitting: Cardiology

## 2024-02-04 VITALS — BP 132/74 | HR 77 | Ht 64.0 in | Wt 293.0 lb

## 2024-02-04 DIAGNOSIS — I10 Essential (primary) hypertension: Secondary | ICD-10-CM

## 2024-02-04 DIAGNOSIS — E66812 Obesity, class 2: Secondary | ICD-10-CM | POA: Insufficient documentation

## 2024-02-04 DIAGNOSIS — R0609 Other forms of dyspnea: Secondary | ICD-10-CM

## 2024-02-04 DIAGNOSIS — R32 Unspecified urinary incontinence: Secondary | ICD-10-CM | POA: Insufficient documentation

## 2024-02-04 DIAGNOSIS — G4733 Obstructive sleep apnea (adult) (pediatric): Secondary | ICD-10-CM

## 2024-02-04 NOTE — Addendum Note (Signed)
 Addended by: ARLOA PLANAS D on: 02/04/2024 03:41 PM   Modules accepted: Orders

## 2024-02-04 NOTE — Progress Notes (Signed)
 Cardiology Office Note:    Date:  02/04/2024   ID:  MARCIE SHEARON, DOB [birth year], MRN [id number]  PCP:  Domenica Harlene LABOR, MD  Cardiologist:  Lamar Fitch, MD    Referring MD: Domenica Harlene LABOR, MD   Chief Complaint  Patient presents with   Annual Exam    History of Present Illness:    Terri [name] is a 73 y.o. female past medical history significant for obstructive sleep apnea, essential hypertension, GERD, low normal ejection fraction and then echocardiogram to be repeated which showed normalization of left ventricle ejection fraction.  Comes today to my office after not being seen for 2 years.  Cardiac wise doing well she thinks she may have a stroke couple months ago she gets a lot of pain in the left side of her head including ear but everything went away and come back to normal.  Gained some weight.  Did have shortness of breath while walking.  Past Medical History:  Diagnosis Date   Allergic rhinitis    Apnea, sleep 06/28/2012   Previously used CPAP   Asthma    Atypical chest pain 01/14/2017   Back pain    Decreased visual acuity 08/05/2016   Depression 06/19/2015   Environmental and seasonal allergies    GERD (gastroesophageal reflux disease)    Heartburn    Hip pain, right 06/28/2012   HTN (hypertension)    Hypokalemia 01/14/2017   Leg pain    Morbid obesity (HCC) 04/11/2008   Qualifier: Diagnosis of  By: Georgian ROSALEA CHARM Lamar    Numbness in feet 05/06/2016   Obesity    Osteoarthritis 01/16/2010   Qualifier: Diagnosis of  By: Georgian ROSALEA CHARM Lamar    Preventative health care 06/25/2015   Shortness of breath    Shortness of breath on exertion    Sleep apnea    Swelling of extremity    Tinnitus    Vertigo     Past Surgical History:  Procedure Laterality Date   BIOPSY BREAST  1997   left    childbirth     COLONOSCOPY  2004   UPPER GASTROINTESTINAL ENDOSCOPY  03/01/2019    Current Medications: Current Meds  Medication Sig   albuterol  (VENTOLIN  HFA) 108 (90  Base) MCG/ACT inhaler INHALE 1-2 PUFFS BY MOUTH EVERY 6 HOURS AS NEEDED FOR WHEEZE OR SHORTNESS OF BREATH   amLODipine -olmesartan  (AZOR ) 10-40 MG tablet Take 1 tablet by mouth daily.   budesonide -formoterol  (SYMBICORT ) 80-4.5 MCG/ACT inhaler Inhale 2 puffs into the lungs See admin instructions. Twice monthly   famotidine  (PEPCID ) 40 MG tablet Take 1 tablet (40 mg total) by mouth daily.   levocetirizine (XYZAL) 5 MG tablet Take 5 mg by mouth every evening.   montelukast  (SINGULAIR ) 10 MG tablet Take 1 tablet (10 mg total) by mouth at bedtime.   MYRBETRIQ 50 MG TB24 tablet Take 50 mg by mouth daily.   omeprazole  (PRILOSEC  OTC) 20 MG tablet Take 1 tablet (20 mg total) by mouth daily.   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 no dietary restrictions  Other Topics Concern   Not on file  Social History Narrative   Single/divorced   1 son born 68 - sometimes there w/ her, leukemia 2016   Music teacher [employer]   Caffeine use 1 cup AM   Never smoker/tobacco   No drugs   Rare-occ EtOHSmoking    Social Drivers of Health   Financial Resource Strain: Low Risk   (01/08/2024)   Overall Financial Resource Strain (CARDIA)    Difficulty of Paying Living Expenses: Not hard at all  Food Insecurity: No Food Insecurity (01/08/2024)   Hunger Vital Sign    Worried About Running Out of Food in the Last Year: Never true    Ran Out of Food in the Last Year: Never true  Transportation Needs: No Transportation Needs (01/08/2024)   PRAPARE - Administrator, Civil Service (Medical): No    Lack of Transportation (Non-Medical): No  Physical Activity: Inactive (01/08/2024)   Exercise Vital Sign    Days of Exercise per Week: 0 days    Minutes of Exercise per Session: 0 min  Stress: No Stress Concern Present (01/08/2024)   [employer] of Occupational Health - Occupational Stress Questionnaire    Feeling of Stress: Only a little  Social Connections: Moderately Integrated (01/08/2024)   Social Connection and Isolation Panel    Frequency of Communication with Friends and Family: More than three times a week    Frequency of Social Gatherings with Friends and Family: More than three times a week    Attends Religious Services: More than 4 times per year    Active Member of [employer] or Organizations: Yes    Attends Banker Meetings: More than 4 times per year    Marital Status: Widowed     Family History: The patient's family history includes Cancer in her father and paternal grandfather; Esophageal cancer in her father; Heart disease in her father; Hypertension in her father and mother; Liver disease in her brother and mother; Mental illness in her brother; Stroke in her maternal grandmother and mother. There is no history of Colitis, Colon cancer, Rectal cancer, or Stomach cancer. ROS:   Please see the history of present illness.    All 14 point review of systems negative except as described per history of present illness  EKGs/Labs/Other Studies Reviewed:         Recent Labs: 09/05/2023: TSH 2.11 01/16/2024: ALT 12; BUN 14; Creat 1.01; Hemoglobin  12.9; Platelets 332.0; Potassium 4.2; Sodium 143  Recent Lipid Panel    Component Value Date/Time   CHOL 141 09/05/2023 0000   CHOL 150 08/11/2017 1155   TRIG 81 09/05/2023 0000   HDL 43 09/05/2023 0000   HDL 52 08/11/2017 1155   CHOLHDL 3 11/05/2022 1400   VLDL 27.2 11/05/2022 1400   LDLCALC 82 09/05/2023 0000   LDLCALC 84 08/11/2017 1155    Physical Exam:    VS:  BP 132/74   Pulse 77   Ht  5' 4 (1.626 m)   Wt 293 lb (132.9 kg)   SpO2 96%   BMI 50.29 kg/m     Wt Readings from Last 3 Encounters:  02/04/24 293 lb (132.9 kg)  01/08/24 284 lb (128.8 kg)  12/25/23 284 lb 9.6 oz (129.1 kg)     GEN:  Well nourished, well developed in no acute distress HEENT: Normal NECK: No JVD; No carotid bruits LYMPHATICS: No lymphadenopathy CARDIAC: RRR, no murmurs, no rubs, no gallops RESPIRATORY:  Clear to auscultation without rales, wheezing or rhonchi  ABDOMEN: Soft, non-tender, non-distended MUSCULOSKELETAL:  No edema; No deformity  SKIN: Warm and dry LOWER EXTREMITIES: no swelling NEUROLOGIC:  Alert and oriented x 3 PSYCHIATRIC:  Normal affect   ASSESSMENT:    1. Essential hypertension   2. OSA (obstructive sleep apnea)   3. Morbid obesity (HCC)    PLAN:    In order of problems listed above:  Essential hypertension blood pressure well-controlled continue present management. Dyslipidemia I did review K PN which show me her LDL of 82 HDL 43 good control continue present management. Obstructive sleep apnea she uses dental devices but she lost it so she does not have it right now.  We had a long discussion about what to do with the situation most likely will wait for echocardiogram and decide if we need to do another sleep study in appropriate manage for sleep apnea.  Will be looking for pulmonary hypertension. History of low ejection fraction low normal there is I will repeat echocardiogram   Medication Adjustments/Labs and Tests Ordered: Current medicines are reviewed at  length with the patient today.  Concerns regarding medicines are outlined above.  Orders Placed This Encounter  Procedures   EKG 12-Lead   Medication changes: No orders of the defined types were placed in this encounter.   Signed, Lamar DOROTHA Fitch, MD, [facility] 02/04/2024 3:37 PM    [facility]

## 2024-02-04 NOTE — Patient Instructions (Signed)
 Medication Instructions:  Your physician recommends that you continue on your current medications as directed. Please refer to the Current Medication list given to you today.  *If you need a refill on your cardiac medications before your next appointment, please call your pharmacy*   Lab Work: None Ordered If you have labs (blood work) drawn today and your tests are completely normal, you will receive your results only by: MyChart Message (if you have MyChart) OR A paper copy in the mail If you have any lab test that is abnormal or we need to change your treatment, we will call you to review the results.   Testing/Procedures: Your physician has requested that you have an echocardiogram. Echocardiography is a painless test that uses sound waves to create images of your heart. It provides your doctor with information about the size and shape of your heart and how well your heart's chambers and valves are working. This procedure takes approximately one hour. There are no restrictions for this procedure. Please do NOT wear cologne, perfume, aftershave, or lotions (deodorant is allowed). Please arrive 15 minutes prior to your appointment time.  Please note: We ask at that you not bring children with you during ultrasound (echo/ vascular) testing. Due to room size and safety concerns, children are not allowed in the ultrasound rooms during exams. Our front office staff cannot provide observation of children in our lobby area while testing is being conducted. An adult accompanying a patient to their appointment will only be allowed in the ultrasound room at the discretion of the ultrasound technician under special circumstances. We apologize for any inconvenience.    Follow-Up: At [facility], you and your health needs are our priority.  As part of our continuing mission to provide you with exceptional heart care, we have created designated Provider Care Teams.  These Care Teams include your  primary Cardiologist (physician) and Advanced Practice Providers (APPs -  Physician Assistants and Nurse Practitioners) who all work together to provide you with the care you need, when you need it.  We recommend signing up for the patient portal called MyChart.  Sign up information is provided on this After Visit Summary.  MyChart is used to connect with patients for Virtual Visits (Telemedicine).  Patients are able to view lab/test results, encounter notes, upcoming appointments, etc.  Non-urgent messages can be sent to your provider as well.   To learn more about what you can do with MyChart, go to [email or web address].    Your next appointment:   12 month(s)  The format for your next appointment:   In Person  Provider:   Lamar Fitch, MD    Other Instructions NA

## 2024-02-26 ENCOUNTER — Ambulatory Visit (HOSPITAL_BASED_OUTPATIENT_CLINIC_OR_DEPARTMENT_OTHER)
Admission: RE | Admit: 2024-02-26 | Discharge: 2024-02-26 | Disposition: A | Discharge status: Home / Self Care | Source: Ambulatory Visit | Attending: Family Medicine | Admitting: Family Medicine

## 2024-02-26 ENCOUNTER — Ambulatory Visit: Payer: Self-pay | Admitting: Family Medicine

## 2024-02-26 DIAGNOSIS — M8589 Other specified disorders of bone density and structure, multiple sites: Secondary | ICD-10-CM | POA: Insufficient documentation

## 2024-02-26 DIAGNOSIS — Z13820 Encounter for screening for osteoporosis: Secondary | ICD-10-CM | POA: Insufficient documentation

## 2024-02-26 DIAGNOSIS — M8580 Other specified disorders of bone density and structure, unspecified site: Secondary | ICD-10-CM

## 2024-03-01 ENCOUNTER — Ambulatory Visit (HOSPITAL_BASED_OUTPATIENT_CLINIC_OR_DEPARTMENT_OTHER)
Admission: RE | Admit: 2024-03-01 | Discharge: 2024-03-01 | Disposition: A | Discharge status: Home / Self Care | Source: Ambulatory Visit | Attending: Cardiology | Admitting: Cardiology

## 2024-03-01 DIAGNOSIS — R0609 Other forms of dyspnea: Secondary | ICD-10-CM | POA: Insufficient documentation

## 2024-03-01 LAB — ECHOCARDIOGRAM COMPLETE
AR max vel: 1.93 cm2
AV Area VTI: 1.92 cm2
AV Area mean vel: 1.85 cm2
AV Mean grad: 4.5 mmHg
AV Peak grad: 7.9 mmHg
Ao pk vel: 1.41 m/s
Area-P 1/2: 3.5 cm2
Calc EF: 59.8 %
S' Lateral: 3.4 cm
Single Plane A2C EF: 60.5 %
Single Plane A4C EF: 59 %

## 2024-03-03 ENCOUNTER — Ambulatory Visit: Payer: Self-pay | Admitting: Cardiology

## 2024-03-08 ENCOUNTER — Telehealth: Payer: Self-pay

## 2024-03-08 NOTE — Telephone Encounter (Signed)
 Left message on My Chart with Echo results per Dr. Karry note. Routed to PCP.

## 2024-03-11 ENCOUNTER — Telehealth: Payer: Self-pay

## 2024-03-11 NOTE — Telephone Encounter (Signed)
Pt viewed results on My Chart per Dr. Krasowski's note. Routed to PCP.  

## 2024-03-29 DIAGNOSIS — L639 Alopecia areata, unspecified: Secondary | ICD-10-CM | POA: Diagnosis not present

## 2024-05-02 NOTE — Assessment & Plan Note (Signed)
 Encouraged DASH or MIND diet, decrease po intake and increase exercise as tolerated. Needs 7-8 hours of sleep nightly. Avoid trans fats, eat small, frequent meals every 4-5 hours with lean proteins, complex carbs and healthy fats. Minimize simple carbs, high fat foods and processed foods

## 2024-05-02 NOTE — Assessment & Plan Note (Signed)
 Supplement and monitor

## 2024-05-02 NOTE — Assessment & Plan Note (Signed)
Uses oral appliance intermittently

## 2024-05-02 NOTE — Progress Notes (Unsigned)
 Subjective:    [name], female    DOB: 1951/01/03, 73 y.o.   MRN: [id number]  No chief complaint on file.   HPI Discussed the use of AI scribe software for clinical note transcription with the patient, who gave verbal consent to proceed.  History of Present Illness     Past Medical History:  Diagnosis Date  . Allergic rhinitis   . Apnea, sleep 06/28/2012   Previously used CPAP  . Asthma   . Atypical chest pain 01/14/2017  . Back pain   . Decreased visual acuity 08/05/2016  . Depression 06/19/2015  . Environmental and seasonal allergies   . GERD (gastroesophageal reflux disease)   . Heartburn   . Hip pain, right 06/28/2012  . HTN (hypertension)   . Hypokalemia 01/14/2017  . Leg pain   . Morbid obesity (HCC) 04/11/2008   Qualifier: Diagnosis of  By: Georgian ROSALEA CHARM Lamar   . Numbness in feet 05/06/2016  . Obesity   . Osteoarthritis 01/16/2010   Qualifier: Diagnosis of  By: Georgian ROSALEA CHARM Lamar   . Preventative health care 06/25/2015  . Shortness of breath   . Shortness of breath on exertion   . Sleep apnea   . Swelling of extremity   . Tinnitus   . Vertigo     Past Surgical History:  Procedure Laterality Date  . BIOPSY BREAST  1997   left   . childbirth    . COLONOSCOPY  2004  . UPPER GASTROINTESTINAL ENDOSCOPY  03/01/2019    Family History  Problem Relation Age of Onset  . Stroke Mother   . Liver disease Mother        hepatitis b  . Hypertension Mother   . Cancer Father        esophagus, prostate  . Hypertension Father   . Heart disease Father   . Esophageal cancer Father   . Liver disease Brother        hep c  . Mental illness Brother        PTSD from [place]  . Stroke Maternal Grandmother   . Cancer Paternal Grandfather        prostate  . Colitis Neg Hx   . Colon cancer Neg Hx   . Rectal cancer Neg Hx   . Stomach cancer Neg Hx     Social History   Socioeconomic History  . Marital status: Divorced    Spouse name: Not on file  .  Number of children: 1  . Years of education: Not on file  . Highest education level: Not on file  Occupational History  . Occupation: HS music teacher- [employer]  . 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 Smoking status: Never  . Smokeless tobacco: Never  Vaping Use  . Vaping status: Never Used  Substance and Sexual Activity  . Alcohol use: Yes    Comment: social, monthly or less  . Drug use: No  . 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 Not on file  Social History Narrative   Single/divorced   1 son born 20 - sometimes there w/ her, leukemia 2016   Music teacher [employer]   Caffeine use 1 cup AM   Never smoker/tobacco   No drugs   Rare-occ EtOHSmoking    Social Drivers  of Health   Financial Resource Strain: Low Risk  (01/08/2024)   Overall Financial Resource Strain (CARDIA)   . Difficulty of Paying Living Expenses: Not hard at all  Food Insecurity: No Food Insecurity (01/08/2024)   Hunger Vital Sign   . Worried About Programme Researcher, Broadcasting/film/video in the Last Year: Never true   . Ran Out of Food in the Last Year: Never true  Transportation Needs: No Transportation Needs (01/08/2024)   PRAPARE - Transportation   . Lack of Transportation (Medical): No   . Lack of Transportation (Non-Medical): No  Physical Activity: Inactive (01/08/2024)   Exercise Vital Sign   . Days of Exercise per Week: 0 days   . Minutes of Exercise per Session: 0 min  Stress: No Stress Concern Present (01/08/2024)   [employer] of Occupational Health - Occupational Stress Questionnaire   . Feeling of Stress: Only a little  Social Connections: Moderately Integrated (01/08/2024)   Social Connection and Isolation Panel   . Frequency of Communication with Friends and Family: More than three times a week   . Frequency of Social Gatherings with Friends and  Family: More than three times a week   . Attends Religious Services: More than 4 times per year   . Active Member of Clubs or Organizations: Yes   . Attends Banker Meetings: More than 4 times per year   . Marital Status: Widowed  Intimate Partner Violence: Not At Risk (01/08/2024)   Humiliation, Afraid, Rape, and Kick questionnaire   . Fear of Current or Ex-Partner: No   . Emotionally Abused: No   . Physically Abused: No   . Sexually Abused: No    Outpatient Medications Prior to Visit  Medication Sig Dispense Refill  . albuterol  (VENTOLIN  HFA) 108 (90 Base) MCG/ACT inhaler INHALE 1-2 PUFFS BY MOUTH EVERY 6 HOURS AS NEEDED FOR WHEEZE OR SHORTNESS OF BREATH 8.5 each 5  . amLODipine -olmesartan  (AZOR ) 10-40 MG tablet Take 1 tablet by mouth daily. 90 tablet 1  . budesonide -formoterol  (SYMBICORT ) 80-4.5 MCG/ACT inhaler Inhale 2 puffs into the lungs See admin instructions. Twice monthly 1 each   . famotidine  (PEPCID ) 40 MG tablet Take 1 tablet (40 mg total) by mouth daily. 90 tablet 1  . levocetirizine (XYZAL) 5 MG tablet Take 5 mg by mouth every evening.    . montelukast  (SINGULAIR ) 10 MG tablet Take 1 tablet (10 mg total) by mouth at bedtime. 90 tablet 1  . MYRBETRIQ 50 MG TB24 tablet Take 50 mg by mouth daily.    . omeprazole  (PRILOSEC  OTC) 20 MG tablet Take 1 tablet (20 mg total) by mouth daily. 90 tablet 1  . Vitamin D , Ergocalciferol , (DRISDOL ) 1.25 MG (50000 UNIT) CAPS capsule TAKE 1 CAPSULE (50,000 UNITS TOTAL) BY MOUTH EVERY 7 (SEVEN) DAYS 5 capsule 4   No facility-administered medications prior to visit.    Allergies  Allergen Reactions  . Sulfamethoxazole-Trimethoprim     Other reaction(s): Unknown  . Sulfonamide Derivatives   . Sulfa Antibiotics Rash    Review of Systems  Constitutional:  Negative for fever and malaise/fatigue.  HENT:  Negative for congestion.   Eyes:  Negative for blurred vision.  Respiratory:  Negative for shortness of breath.    Cardiovascular:  Negative for chest pain, palpitations and leg swelling.  Gastrointestinal:  Negative for abdominal pain, blood in stool and nausea.  Genitourinary:  Negative for dysuria and frequency.  Musculoskeletal:  Negative for falls.  Skin:  Negative for rash.  Neurological:  Negative for dizziness, loss of consciousness and headaches.  Endo/Heme/Allergies:  Negative for environmental allergies.  Psychiatric/Behavioral:  Negative for depression. The patient is not nervous/anxious.        Objective:    Physical Exam Constitutional:      General: She is not in acute distress.    Appearance: Normal appearance. She is well-developed. She is not toxic-appearing.  HENT:     Head: Normocephalic and atraumatic.     Right Ear: External ear normal.     Left Ear: External ear normal.     Nose: Nose normal.  Eyes:     General:        Right eye: No discharge.        Left eye: No discharge.     Conjunctiva/sclera: Conjunctivae normal.  Neck:     Thyroid : No thyromegaly.  Cardiovascular:     Rate and Rhythm: Normal rate and regular rhythm.     Heart sounds: Normal heart sounds. No murmur heard. Pulmonary:     Effort: Pulmonary effort is normal. No respiratory distress.     Breath sounds: Normal breath sounds.  Abdominal:     General: Bowel sounds are normal.     Palpations: Abdomen is soft.     Tenderness: There is no abdominal tenderness. There is no guarding.  Musculoskeletal:        General: Normal range of motion.     Cervical back: Neck supple.  Lymphadenopathy:     Cervical: No cervical adenopathy.  Skin:    General: Skin is warm and dry.  Neurological:     Mental Status: She is alert and oriented to person, place, and time.  Psychiatric:        Mood and Affect: Mood normal.        Behavior: Behavior normal.        Thought Content: Thought content normal.        Judgment: Judgment normal.    There were no vitals taken for this visit. Wt Readings from Last 3  Encounters:  02/04/24 293 lb (132.9 kg)  01/08/24 284 lb (128.8 kg)  12/25/23 284 lb 9.6 oz (129.1 kg)    Diabetic Foot Exam - Simple   No data filed    Lab Results  Component Value Date   WBC 8.0 01/16/2024   HGB 12.9 01/16/2024   HCT 40.2 01/16/2024   PLT 332.0 01/16/2024   GLUCOSE 102 (H) 01/16/2024   CHOL 141 09/05/2023   TRIG 81 09/05/2023   HDL 43 09/05/2023   LDLCALC 82 09/05/2023   ALT 12 01/16/2024   AST 12 01/16/2024   NA 143 01/16/2024   K 4.2 01/16/2024   CL 107 01/16/2024   CREATININE 1.01 (H) 01/16/2024   BUN 14 01/16/2024   CO2 28 01/16/2024   TSH 2.11 09/05/2023   HGBA1C 5.6 09/05/2023    Lab Results  Component Value Date   TSH 2.11 09/05/2023   Lab Results  Component Value Date   WBC 8.0 01/16/2024   HGB 12.9 01/16/2024   HCT 40.2 01/16/2024   MCV 95.7 01/16/2024   PLT 332.0 01/16/2024   Lab Results  Component Value Date   NA 143 01/16/2024   K 4.2 01/16/2024   CO2 28 01/16/2024   GLUCOSE 102 (H) 01/16/2024   BUN 14 01/16/2024   CREATININE 1.01 (H) 01/16/2024   BILITOT 0.3 01/16/2024   ALKPHOS 79 12/17/2023   AST 12 01/16/2024   ALT 12 01/16/2024  PROT 6.5 01/16/2024   ALBUMIN 3.9 12/17/2023   CALCIUM 9.1 01/16/2024   ANIONGAP 7 03/19/2019   EGFR 55 09/04/2023   GFR 56.81 (L) 12/17/2023   Lab Results  Component Value Date   CHOL 141 09/05/2023   Lab Results  Component Value Date   HDL 43 09/05/2023   Lab Results  Component Value Date   LDLCALC 82 09/05/2023   Lab Results  Component Value Date   TRIG 81 09/05/2023   Lab Results  Component Value Date   CHOLHDL 3 11/05/2022   Lab Results  Component Value Date   HGBA1C 5.6 09/05/2023       Assessment & Plan:  Vitamin D  deficiency Assessment & Plan: Supplement and monitor    OSA (obstructive sleep apnea) Assessment & Plan: Uses oral appliance intermittently   Obesity, class 2 Assessment & Plan: Encouraged DASH or MIND diet, decrease po intake and  increase exercise as tolerated. Needs 7-8 hours of sleep nightly. Avoid trans fats, eat small, frequent meals every 4-5 hours with lean proteins, complex carbs and healthy fats. Minimize simple carbs, high fat foods and processed foods    Morbid obesity (HCC) Assessment & Plan: Encouraged DASH or MIND diet, decrease po intake and increase exercise as tolerated. Needs 7-8 hours of sleep nightly. Avoid trans fats, eat small, frequent meals every 4-5 hours with lean proteins, complex carbs and healthy fats. Minimize simple carbs, high fat foods and processed foods.      Assessment and Plan Assessment & Plan      Harlene Horton, MD

## 2024-05-03 ENCOUNTER — Encounter: Payer: Self-pay | Admitting: Family Medicine

## 2024-05-03 ENCOUNTER — Ambulatory Visit: Admitting: Family Medicine

## 2024-05-03 VITALS — BP 128/82 | HR 70 | Temp 98.0°F | Resp 16 | Ht 64.0 in | Wt 289.0 lb

## 2024-05-03 DIAGNOSIS — E66812 Obesity, class 2: Secondary | ICD-10-CM | POA: Diagnosis not present

## 2024-05-03 DIAGNOSIS — E538 Deficiency of other specified B group vitamins: Secondary | ICD-10-CM

## 2024-05-03 DIAGNOSIS — I10 Essential (primary) hypertension: Secondary | ICD-10-CM

## 2024-05-03 DIAGNOSIS — R519 Headache, unspecified: Secondary | ICD-10-CM

## 2024-05-03 DIAGNOSIS — G4733 Obstructive sleep apnea (adult) (pediatric): Secondary | ICD-10-CM | POA: Diagnosis not present

## 2024-05-03 DIAGNOSIS — G8929 Other chronic pain: Secondary | ICD-10-CM

## 2024-05-03 DIAGNOSIS — E559 Vitamin D deficiency, unspecified: Secondary | ICD-10-CM

## 2024-05-03 DIAGNOSIS — R739 Hyperglycemia, unspecified: Secondary | ICD-10-CM

## 2024-05-03 DIAGNOSIS — E782 Mixed hyperlipidemia: Secondary | ICD-10-CM

## 2024-05-03 DIAGNOSIS — M8580 Other specified disorders of bone density and structure, unspecified site: Secondary | ICD-10-CM

## 2024-05-03 LAB — LIPID PANEL
Cholesterol: 140 mg/dL (ref 0–200)
HDL: 42.9 mg/dL (ref >39.00)
LDL Cholesterol: 81 mg/dL (ref 0–99)
NonHDL: 97.32
Total CHOL/HDL Ratio: 3
Triglycerides: 82 mg/dL (ref 0.0–149.0)
VLDL: 16.4 mg/dL (ref 0.0–40.0)

## 2024-05-03 LAB — CBC WITH DIFFERENTIAL/PLATELET
Basophils Absolute: 0.1 K/uL (ref 0.0–0.1)
Basophils Relative: 1 % (ref 0.0–3.0)
Eosinophils Absolute: 0.5 K/uL (ref 0.0–0.7)
Eosinophils Relative: 6.2 % — ABNORMAL HIGH (ref 0.0–5.0)
HCT: 40.6 % (ref 36.0–46.0)
Hemoglobin: 13.2 g/dL (ref 12.0–15.0)
Lymphocytes Relative: 23.5 % (ref 12.0–46.0)
Lymphs Abs: 1.8 K/uL (ref 0.7–4.0)
MCHC: 32.5 g/dL (ref 30.0–36.0)
MCV: 94.7 fl (ref 78.0–100.0)
Monocytes Absolute: 0.9 K/uL (ref 0.1–1.0)
Monocytes Relative: 11.8 % (ref 3.0–12.0)
Neutro Abs: 4.4 K/uL (ref 1.4–7.7)
Neutrophils Relative %: 57.5 % (ref 43.0–77.0)
Platelets: 343 K/uL (ref 150.0–400.0)
RBC: 4.28 Mil/uL (ref 3.87–5.11)
RDW: 14.2 % (ref 11.5–15.5)
WBC: 7.7 K/uL (ref 4.0–10.5)

## 2024-05-03 LAB — TSH: TSH: 2.04 u[IU]/mL (ref 0.35–5.50)

## 2024-05-03 LAB — COMPREHENSIVE METABOLIC PANEL WITH GFR
ALT: 12 U/L (ref 0–35)
AST: 13 U/L (ref 0–37)
Albumin: 4 g/dL (ref 3.5–5.2)
Alkaline Phosphatase: 65 U/L (ref 39–117)
BUN: 14 mg/dL (ref 6–23)
CO2: 31 meq/L (ref 19–32)
Calcium: 9.4 mg/dL (ref 8.4–10.5)
Chloride: 105 meq/L (ref 96–112)
Creatinine, Ser: 1.13 mg/dL (ref 0.40–1.20)
GFR: 48.93 mL/min — ABNORMAL LOW (ref >60.00)
Glucose, Bld: 95 mg/dL (ref 70–99)
Potassium: 4.4 meq/L (ref 3.5–5.1)
Sodium: 142 meq/L (ref 135–145)
Total Bilirubin: 0.7 mg/dL (ref 0.2–1.2)
Total Protein: 6.8 g/dL (ref 6.0–8.3)

## 2024-05-03 LAB — VITAMIN D 25 HYDROXY (VIT D DEFICIENCY, FRACTURES): VITD: 28.19 ng/mL — ABNORMAL LOW (ref 30.00–100.00)

## 2024-05-03 LAB — HEMOGLOBIN A1C: Hgb A1c MFr Bld: 5.2 % (ref 4.6–6.5)

## 2024-05-03 LAB — SEDIMENTATION RATE: Sed Rate: 13 mm/h (ref 0–30)

## 2024-05-03 LAB — VITAMIN B12: Vitamin B-12: 345 pg/mL (ref 211–911)

## 2024-05-03 NOTE — Patient Instructions (Signed)
 [facility] are now walk in vaccine clinics M-F 9-4 Tetanus due Covid and Flu annually RSV, Respiratory Syncitial Virus Vaccine, Arexvy at pharmacy Prevnar 20 is due

## 2024-05-04 ENCOUNTER — Ambulatory Visit: Payer: Self-pay | Admitting: Family Medicine

## 2024-06-06 ENCOUNTER — Other Ambulatory Visit: Payer: Self-pay | Admitting: Family Medicine

## 2024-06-06 DIAGNOSIS — I10 Essential (primary) hypertension: Secondary | ICD-10-CM

## 2024-06-08 DIAGNOSIS — K219 Gastro-esophageal reflux disease without esophagitis: Secondary | ICD-10-CM

## 2024-06-08 DIAGNOSIS — R059 Cough, unspecified: Secondary | ICD-10-CM

## 2024-06-08 DIAGNOSIS — J309 Allergic rhinitis, unspecified: Secondary | ICD-10-CM

## 2024-06-20 NOTE — Assessment & Plan Note (Addendum)
 Patient encouraged to maintain heart healthy diet, regular exercise, adequate sleep. Consider daily probiotics. Take medications as prescribed. Labs ordered and reviewed.  Colonoscopy normal in 2020 has never had polyps so GI has suggested she may be done with colonoscopies MM 09/2023, repeat every 1-2 years Pap 2025 with OB will request records,

## 2024-06-20 NOTE — Assessment & Plan Note (Signed)
 Supplement and monitor

## 2024-06-20 NOTE — Assessment & Plan Note (Signed)
 Well controlled, no changes to meds. Encouraged heart healthy diet such as the DASH diet and exercise as tolerated.

## 2024-06-20 NOTE — Assessment & Plan Note (Signed)
 Her BMI today is 46. Encouraged ongoing weight-loss efforts, as even modest reductions can significantly improve overall health. Encouraged DASH or MIND diet, decrease po intake and increase exercise as tolerated. Needs 7-8 hours of sleep nightly. Avoid trans fats, eat small, frequent meals every 4-5 hours with lean proteins, complex carbs and healthy fats. Minimize simple carbs, high fat foods and processed foods

## 2024-06-20 NOTE — Assessment & Plan Note (Signed)
 Encourage heart healthy diet such as MIND or DASH diet, increase exercise, avoid trans fats, simple carbohydrates and processed foods, consider a krill or fish or flaxseed oil cap daily.

## 2024-06-20 NOTE — Assessment & Plan Note (Signed)
 Using cpap

## 2024-06-20 NOTE — Assessment & Plan Note (Signed)
 hgba1c acceptable, minimize simple carbs. Increase exercise as tolerated.

## 2024-06-20 NOTE — Progress Notes (Unsigned)
 "  Subjective:    [name], female    DOB: 1950-11-24, 74 y.o.   MRN: [id number]  No chief complaint on file.   HPI Discussed the use of AI scribe software for clinical note transcription with the patient, who gave verbal consent to proceed.  History of Present Illness     Past Medical History:  Diagnosis Date   Allergic rhinitis    Apnea, sleep 06/28/2012   Previously used CPAP   Asthma    Atypical chest pain 01/14/2017   Back pain    Decreased visual acuity 08/05/2016   Depression 06/19/2015   Environmental and seasonal allergies    GERD (gastroesophageal reflux disease)    Heartburn    Hip pain, right 06/28/2012   HTN (hypertension)    Hypokalemia 01/14/2017   Leg pain    Morbid obesity (HCC) 04/11/2008   Qualifier: Diagnosis of  By: Georgian ROSALEA CHARM Lamar    Numbness in feet 05/06/2016   Obesity    Osteoarthritis 01/16/2010   Qualifier: Diagnosis of  By: Georgian ROSALEA CHARM Lamar    Preventative health care 06/25/2015   Shortness of breath    Shortness of breath on exertion    Sleep apnea    Swelling of extremity    Tinnitus    Vertigo     Past Surgical History:  Procedure Laterality Date   BIOPSY BREAST  1997   left    childbirth     COLONOSCOPY  2004   UPPER GASTROINTESTINAL ENDOSCOPY  03/01/2019    Family History  Problem Relation Age of Onset   Stroke Mother    Liver disease Mother        hepatitis b   Hypertension Mother    Cancer Father        esophagus, prostate   Hypertension Father    Heart disease Father    Esophageal cancer Father    Liver disease Brother        hep c   Mental illness Brother        PTSD from [place]   Stroke Maternal Grandmother    Cancer Paternal Grandfather        prostate   Colitis Neg Hx    Colon cancer Neg Hx    Rectal cancer Neg Hx    Stomach cancer Neg Hx     Social History   Socioeconomic History   Marital status: Divorced    Spouse name: Not on file    Number of children: 1   Years of education: Not on file   Highest education level: Not on file  Occupational History   Occupation: HS music teacher- Location Manager   Occupation: retired    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 no dietary restrictions  Other Topics Concern   Not on file  Social History Narrative   Single/divorced   1 son born 36 - sometimes there w/ her, leukemia 2016   Music teacher [employer]   Caffeine use 1 cup AM   Never smoker/tobacco   No drugs   Rare-occ EtOHSmoking    Social  Drivers of Health   Tobacco Use: Low Risk (05/03/2024)   Patient History    Smoking Tobacco Use: Never    Smokeless Tobacco Use: Never    Passive Exposure: Not on file  Financial Resource Strain: Low Risk (01/08/2024)   Overall Financial Resource Strain (CARDIA)    Difficulty of Paying Living Expenses: Not hard at all  Food Insecurity: No Food Insecurity (01/08/2024)   Epic    Worried About Radiation Protection Practitioner of Food in the Last Year: Never true    Ran Out of Food in the Last Year: Never true  Transportation Needs: No Transportation Needs (01/08/2024)   Epic    Lack of Transportation (Medical): No    Lack of Transportation (Non-Medical): No  Physical Activity: Inactive (01/08/2024)   Exercise Vital Sign    Days of Exercise per Week: 0 days    Minutes of Exercise per Session: 0 min  Stress: No Stress Concern Present (01/08/2024)   [employer] of Occupational Health - Occupational Stress Questionnaire    Feeling of Stress: Only a little  Social Connections: Moderately Integrated (01/08/2024)   Social Connection and Isolation Panel    Frequency of  Communication with Friends and Family: More than three times a week    Frequency of Social Gatherings with Friends and Family: More than three times a week    Attends Religious Services: More than 4 times per year    Active Member of [employer] or Organizations: Yes    Attends Banker Meetings: More than 4 times per year    Marital Status: Widowed  Intimate Partner Violence: Not At Risk (01/08/2024)   Epic    Fear of Current or Ex-Partner: No    Emotionally Abused: No    Physically Abused: No    Sexually Abused: No  Depression (PHQ2-9): Medium Risk (05/03/2024)   Depression (PHQ2-9)    PHQ-2 Score: 5  Alcohol Screen: Low Risk (01/08/2024)   Alcohol Screen    Last Alcohol Screening Score (AUDIT): 1  Housing: Low Risk (01/08/2024)   Epic    Unable to Pay for Housing in the Last Year: No    Number of Times Moved in the Last Year: 0    Homeless in the Last Year: No  Utilities: Not At Risk (01/08/2024)   Epic    Threatened with loss of utilities: No  Health Literacy: Adequate Health Literacy (01/08/2024)   B1300 Health Literacy    Frequency of need for help with medical instructions: Never    Outpatient Medications Prior to Visit  Medication Sig Dispense Refill   albuterol  (VENTOLIN  HFA) 108 (90 Base) MCG/ACT inhaler INHALE 1-2 PUFFS BY MOUTH EVERY 6 HOURS AS NEEDED FOR WHEEZE OR SHORTNESS OF BREATH 8.5 each 5   amLODipine -olmesartan  (AZOR ) 10-40 MG tablet TAKE 1 TABLET BY MOUTH EVERY DAY 90 tablet 1   budesonide -formoterol  (SYMBICORT ) 80-4.5 MCG/ACT inhaler Inhale 2 puffs into the lungs See admin instructions. Twice monthly 1 each    famotidine  (PEPCID ) 40 MG tablet TAKE 1 TABLET BY MOUTH EVERY DAY 90 tablet 1   levocetirizine (XYZAL) 5 MG tablet Take 5 mg by mouth every evening.     montelukast  (SINGULAIR ) 10 MG tablet TAKE 1 TABLET BY MOUTH EVERYDAY AT BEDTIME 90 tablet 1   MYRBETRIQ 50 MG TB24 tablet Take 50 mg by mouth daily.     omeprazole  (PRILOSEC ) 20  MG capsule TAKE 1 CAPSULE BY MOUTH EVERY DAY 90 capsule 1   Vitamin D , Ergocalciferol , (DRISDOL ) 1.25  MG (50000 UNIT) CAPS capsule TAKE 1 CAPSULE (50,000 UNITS TOTAL) BY MOUTH EVERY 7 (SEVEN) DAYS 5 capsule 4   No facility-administered medications prior to visit.    Allergies[1]  Review of Systems  Constitutional:  Negative for chills, fever and malaise/fatigue.  HENT:  Negative for congestion and hearing loss.   Eyes:  Negative for blurred vision and discharge.  Respiratory:  Negative for cough, sputum production and shortness of breath.   Cardiovascular:  Negative for chest pain, palpitations and leg swelling.  Gastrointestinal:  Negative for abdominal pain, blood in stool, constipation, diarrhea, heartburn, nausea and vomiting.  Genitourinary:  Negative for dysuria, frequency, hematuria and urgency.  Musculoskeletal:  Negative for back pain, falls and myalgias.  Skin:  Negative for rash.  Neurological:  Negative for dizziness, sensory change, loss of consciousness, weakness and headaches.  Endo/Heme/Allergies:  Negative for environmental allergies. Does not bruise/bleed easily.  Psychiatric/Behavioral:  Negative for depression and suicidal ideas. The patient is not nervous/anxious and does not have insomnia.        Objective:    Physical Exam Constitutional:      General: She is not in acute distress.    Appearance: Normal appearance. She is not diaphoretic.  HENT:     Head: Normocephalic and atraumatic.     Right Ear: Tympanic membrane, ear canal and external ear normal.     Left Ear: Tympanic membrane, ear canal and external ear normal.     Nose: Nose normal.     Mouth/Throat:     Mouth: Mucous membranes are moist.     Pharynx: Oropharynx is clear. No oropharyngeal exudate.  Eyes:     General: No scleral icterus.       Right eye: No discharge.        Left eye: No discharge.     Conjunctiva/sclera: Conjunctivae normal.     Pupils: Pupils are equal, round, and reactive  to light.  Neck:     Thyroid : No thyromegaly.  Cardiovascular:     Rate and Rhythm: Normal rate and regular rhythm.     Heart sounds: Normal heart sounds. No murmur heard. Pulmonary:     Effort: Pulmonary effort is normal. No respiratory distress.     Breath sounds: Normal breath sounds. No wheezing or rales.  Abdominal:     General: Bowel sounds are normal. There is no distension.     Palpations: Abdomen is soft. There is no mass.     Tenderness: There is no abdominal tenderness.  Musculoskeletal:        General: No tenderness. Normal range of motion.     Cervical back: Normal range of motion and neck supple.  Lymphadenopathy:     Cervical: No cervical adenopathy.  Skin:    General: Skin is warm and dry.     Findings: No rash.  Neurological:     General: No focal deficit present.     Mental Status: She is alert and oriented to person, place, and time.     Cranial Nerves: No cranial nerve deficit.     Coordination: Coordination normal.     Deep Tendon Reflexes: Reflexes are normal and symmetric. Reflexes normal.  Psychiatric:        Mood and Affect: Mood normal.        Behavior: Behavior normal.        Thought Content: Thought content normal.        Judgment: Judgment normal.    There were no vitals taken for  this visit. Wt Readings from Last 3 Encounters:  05/03/24 289 lb (131.1 kg)  02/04/24 293 lb (132.9 kg)  01/08/24 284 lb (128.8 kg)    Diabetic Foot Exam - Simple   No data filed    Lab Results  Component Value Date   WBC 7.7 05/03/2024   HGB 13.2 05/03/2024   HCT 40.6 05/03/2024   PLT 343.0 05/03/2024   GLUCOSE 95 05/03/2024   CHOL 140 05/03/2024   TRIG 82.0 05/03/2024   HDL 42.90 05/03/2024   LDLCALC 81 05/03/2024   ALT 12 05/03/2024   AST 13 05/03/2024   NA 142 05/03/2024   K 4.4 05/03/2024   CL 105 05/03/2024   CREATININE 1.13 05/03/2024   BUN 14 05/03/2024   CO2 31 05/03/2024   TSH 2.04 05/03/2024   HGBA1C 5.2 05/03/2024    Lab Results   Component Value Date   TSH 2.04 05/03/2024   Lab Results  Component Value Date   WBC 7.7 05/03/2024   HGB 13.2 05/03/2024   HCT 40.6 05/03/2024   MCV 94.7 05/03/2024   PLT 343.0 05/03/2024   Lab Results  Component Value Date   NA 142 05/03/2024   K 4.4 05/03/2024   CO2 31 05/03/2024   GLUCOSE 95 05/03/2024   BUN 14 05/03/2024   CREATININE 1.13 05/03/2024   BILITOT 0.7 05/03/2024   ALKPHOS 65 05/03/2024   AST 13 05/03/2024   ALT 12 05/03/2024   PROT 6.8 05/03/2024   ALBUMIN 4.0 05/03/2024   CALCIUM 9.4 05/03/2024   ANIONGAP 7 03/19/2019   EGFR 55 09/04/2023   GFR 48.93 (L) 05/03/2024   Lab Results  Component Value Date   CHOL 140 05/03/2024   Lab Results  Component Value Date   HDL 42.90 05/03/2024   Lab Results  Component Value Date   LDLCALC 81 05/03/2024   Lab Results  Component Value Date   TRIG 82.0 05/03/2024   Lab Results  Component Value Date   CHOLHDL 3 05/03/2024   Lab Results  Component Value Date   HGBA1C 5.2 05/03/2024       Assessment & Plan:  Vitamin D  deficiency Assessment & Plan: Supplement and monitor    Preventative health care Assessment & Plan: Patient encouraged to maintain heart healthy diet, regular exercise, adequate sleep. Consider daily probiotics. Take medications as prescribed. Labs ordered and reviewed.  Colonoscopy normal in 2020  MM 09/2023 pap   Morbid obesity (HCC) Assessment & Plan: Encouraged DASH or MIND diet, decrease po intake and increase exercise as tolerated. Needs 7-8 hours of sleep nightly. Avoid trans fats, eat small, frequent meals every 4-5 hours with lean proteins, complex carbs and healthy fats. Minimize simple carbs, high fat foods and processed foods.    Hyperlipidemia, mixed Assessment & Plan: Encourage heart healthy diet such as MIND or DASH diet, increase exercise, avoid trans fats, simple carbohydrates and processed foods, consider a krill or fish or flaxseed oil cap daily.     Hyperglycemia Assessment & Plan: hgba1c acceptable, minimize simple carbs. Increase exercise as tolerated.   Essential hypertension Assessment & Plan: Well controlled, no changes to meds. Encouraged heart healthy diet such as the DASH diet and exercise as tolerated.    Allergic rhinitis, unspecified seasonality, unspecified trigger  OSA (obstructive sleep apnea) Assessment & Plan: Using cpap     Assessment and Plan Assessment & Plan      Harlene Horton, MD     [1] Allergies Allergen Reactions   Sulfamethoxazole-Trimethoprim  Other reaction(s): Unknown   Sulfonamide Derivatives    Sulfa Antibiotics Rash  "

## 2024-06-24 ENCOUNTER — Ambulatory Visit: Admitting: Family Medicine

## 2024-06-24 ENCOUNTER — Encounter: Payer: Self-pay | Admitting: Family Medicine

## 2024-06-24 VITALS — BP 118/72 | HR 58 | Temp 98.0°F | Resp 16 | Ht 64.0 in | Wt 284.2 lb

## 2024-06-24 DIAGNOSIS — Z0000 Encounter for general adult medical examination without abnormal findings: Secondary | ICD-10-CM | POA: Diagnosis not present

## 2024-06-24 DIAGNOSIS — G4733 Obstructive sleep apnea (adult) (pediatric): Secondary | ICD-10-CM

## 2024-06-24 DIAGNOSIS — E559 Vitamin D deficiency, unspecified: Secondary | ICD-10-CM

## 2024-06-24 DIAGNOSIS — R5383 Other fatigue: Secondary | ICD-10-CM | POA: Diagnosis not present

## 2024-06-24 DIAGNOSIS — I10 Essential (primary) hypertension: Secondary | ICD-10-CM

## 2024-06-24 DIAGNOSIS — J309 Allergic rhinitis, unspecified: Secondary | ICD-10-CM

## 2024-06-24 DIAGNOSIS — M791 Myalgia, unspecified site: Secondary | ICD-10-CM

## 2024-06-24 DIAGNOSIS — E782 Mixed hyperlipidemia: Secondary | ICD-10-CM

## 2024-06-24 DIAGNOSIS — R739 Hyperglycemia, unspecified: Secondary | ICD-10-CM | POA: Diagnosis not present

## 2024-06-24 NOTE — Patient Instructions (Addendum)
 Prevnar 20  RSV, Respiratory Syncitial Virus Vaccine, Arexvy  Annual and Covid booster Second Shingrix shot Tetanus booster  Preventive Care 65 Years and Older, Female Preventive care refers to lifestyle choices and visits with your health care provider that can promote health and wellness. Preventive care visits are also called wellness exams. What can I expect for my preventive care visit? Counseling Your health care provider may ask you questions about your: Medical history, including: Past medical problems. Family medical history. Pregnancy and menstrual history. History of falls. Current health, including: Memory and ability to understand (cognition). Emotional well-being. Home life and relationship well-being. Sexual activity and sexual health. Lifestyle, including: Alcohol, nicotine or tobacco, and drug use. Access to firearms. Diet, exercise, and sleep habits. Work and work astronomer. Sunscreen use. Safety issues such as seatbelt and bike helmet use. Physical exam Your health care provider will check your: Height and weight. These may be used to calculate your BMI (body mass index). BMI is a measurement that tells if you are at a healthy weight. Waist circumference. This measures the distance around your waistline. This measurement also tells if you are at a healthy weight and may help predict your risk of certain diseases, such as type 2 diabetes and high blood pressure. Heart rate and blood pressure. Body temperature. Skin for abnormal spots. What immunizations do I need?  Vaccines are usually given at various ages, according to a schedule. Your health care provider will recommend vaccines for you based on your age, medical history, and lifestyle or other factors, such as travel or where you work. What tests do I need? Screening Your health care provider may recommend screening tests for certain conditions. This may include: Lipid and cholesterol  levels. Hepatitis C test. Hepatitis B test. HIV (human immunodeficiency virus) test. STI (sexually transmitted infection) testing, if you are at risk. Lung cancer screening. Colorectal cancer screening. Diabetes screening. This is done by checking your blood sugar (glucose) after you have not eaten for a while (fasting). Mammogram. Talk with your health care provider about how often you should have regular mammograms. BRCA-related cancer screening. This may be done if you have a family history of breast, ovarian, tubal, or peritoneal cancers. Bone density scan. This is done to screen for osteoporosis. Talk with your health care provider about your test results, treatment options, and if necessary, the need for more tests. Follow these instructions at home: Eating and drinking  Eat a diet that includes fresh fruits and vegetables, whole grains, lean protein, and low-fat dairy products. Limit your intake of foods with high amounts of sugar, saturated fats, and salt. Take vitamin and mineral supplements as recommended by your health care provider. Do not drink alcohol if your health care provider tells you not to drink. If you drink alcohol: Limit how much you have to 0-1 drink a day. Know how much alcohol is in your drink. In the [place] one drink equals one 12 oz bottle of beer (355 mL), one 5 oz glass of wine (148 mL), or one 1 oz glass of hard liquor (44 mL). Lifestyle Brush your teeth every morning and night with fluoride toothpaste. Floss one time each day. Exercise for at least 30 minutes 5 or more days each week. Do not use any products that contain nicotine or tobacco. These products include cigarettes, chewing tobacco, and vaping devices, such as e-cigarettes. If you need help quitting, ask your health care provider. Do not use drugs. If you are sexually active, practice safe  sex. Use a condom or other form of protection in order to prevent STIs. Take aspirin  only as told by your  health care provider. Make sure that you understand how much to take and what form to take. Work with your health care provider to find out whether it is safe and beneficial for you to take aspirin  daily. Ask your health care provider if you need to take a cholesterol-lowering medicine (statin). Find healthy ways to manage stress, such as: Meditation, yoga, or listening to music. Journaling. Talking to a trusted person. Spending time with friends and family. Minimize exposure to UV radiation to reduce your risk of skin cancer. Safety Always wear your seat belt while driving or riding in a vehicle. Do not drive: If you have been drinking alcohol. Do not ride with someone who has been drinking. When you are tired or distracted. While texting. If you have been using any mind-altering substances or drugs. Wear a helmet and other protective equipment during sports activities. If you have firearms in your house, make sure you follow all gun safety procedures. What's next? Visit your health care provider once a year for an annual wellness visit. Ask your health care provider how often you should have your eyes and teeth checked. Stay up to date on all vaccines. This information is not intended to replace advice given to you by your health care provider. Make sure you discuss any questions you have with your health care provider. Document Revised: 11/15/2020 Document Reviewed: 11/15/2020 Elsevier Patient Education  2024 [employer].

## 2024-06-25 ENCOUNTER — Encounter: Payer: Self-pay | Admitting: Family Medicine

## 2024-06-25 NOTE — Assessment & Plan Note (Signed)
 Hydrate and monitor

## 2024-08-12 ENCOUNTER — Encounter: Admitting: Primary Care

## 2025-01-11 ENCOUNTER — Ambulatory Visit

## 2025-04-14 ENCOUNTER — Encounter: Admitting: Family Medicine
